# Patient Record
Sex: Female | Born: 1935 | Race: White | Hispanic: No | Marital: Single | Smoking: Former smoker
Health system: Southern US, Community
[De-identification: ages and names within clinical notes are randomized; demographics above are authoritative.]

## PROBLEM LIST (undated history)

## (undated) DIAGNOSIS — N289 Disorder of kidney and ureter, unspecified: Secondary | ICD-10-CM

## (undated) DIAGNOSIS — E7800 Pure hypercholesterolemia, unspecified: Secondary | ICD-10-CM

## (undated) DIAGNOSIS — R55 Syncope and collapse: Secondary | ICD-10-CM

## (undated) DIAGNOSIS — J449 Chronic obstructive pulmonary disease, unspecified: Secondary | ICD-10-CM

## (undated) DIAGNOSIS — F329 Major depressive disorder, single episode, unspecified: Secondary | ICD-10-CM

## (undated) DIAGNOSIS — R413 Other amnesia: Secondary | ICD-10-CM

## (undated) DIAGNOSIS — E119 Type 2 diabetes mellitus without complications: Secondary | ICD-10-CM

## (undated) DIAGNOSIS — G309 Alzheimer's disease, unspecified: Secondary | ICD-10-CM

## (undated) DIAGNOSIS — F32A Depression, unspecified: Secondary | ICD-10-CM

## (undated) DIAGNOSIS — M25512 Pain in left shoulder: Secondary | ICD-10-CM

## (undated) DIAGNOSIS — F0280 Dementia in other diseases classified elsewhere without behavioral disturbance: Secondary | ICD-10-CM

## (undated) DIAGNOSIS — G629 Polyneuropathy, unspecified: Secondary | ICD-10-CM

## (undated) DIAGNOSIS — Z950 Presence of cardiac pacemaker: Secondary | ICD-10-CM

## (undated) DIAGNOSIS — J40 Bronchitis, not specified as acute or chronic: Secondary | ICD-10-CM

## (undated) HISTORY — DX: Pure hypercholesterolemia, unspecified: E78.00

## (undated) HISTORY — DX: Presence of cardiac pacemaker: Z95.0

## (undated) HISTORY — PX: PACEMAKER PLACEMENT: SHX43

## (undated) HISTORY — PX: OTHER SURGICAL HISTORY: SHX169

## (undated) HISTORY — DX: Bronchitis, not specified as acute or chronic: J40

## (undated) HISTORY — DX: Syncope and collapse: R55

## (undated) HISTORY — DX: Type 2 diabetes mellitus without complications: E11.9

## (undated) HISTORY — DX: Chronic obstructive pulmonary disease, unspecified: J44.9

## (undated) HISTORY — DX: Depression, unspecified: F32.A

## (undated) HISTORY — DX: Alzheimer's disease, unspecified: G30.9

## (undated) HISTORY — DX: Pain in left shoulder: M25.512

## (undated) HISTORY — DX: Other amnesia: R41.3

## (undated) HISTORY — DX: Major depressive disorder, single episode, unspecified: F32.9

## (undated) HISTORY — DX: Polyneuropathy, unspecified: G62.9

## (undated) HISTORY — DX: Dementia in other diseases classified elsewhere, unspecified severity, without behavioral disturbance, psychotic disturbance, mood disturbance, and anxiety: F02.80

## (undated) HISTORY — DX: Disorder of kidney and ureter, unspecified: N28.9

---

## 2014-01-09 DIAGNOSIS — J45909 Unspecified asthma, uncomplicated: Secondary | ICD-10-CM | POA: Diagnosis not present

## 2014-01-09 DIAGNOSIS — E119 Type 2 diabetes mellitus without complications: Secondary | ICD-10-CM | POA: Diagnosis not present

## 2014-01-09 DIAGNOSIS — Z6827 Body mass index (BMI) 27.0-27.9, adult: Secondary | ICD-10-CM | POA: Diagnosis not present

## 2014-01-09 DIAGNOSIS — N390 Urinary tract infection, site not specified: Secondary | ICD-10-CM | POA: Diagnosis not present

## 2014-01-09 DIAGNOSIS — R350 Frequency of micturition: Secondary | ICD-10-CM | POA: Diagnosis not present

## 2014-01-09 DIAGNOSIS — J329 Chronic sinusitis, unspecified: Secondary | ICD-10-CM | POA: Diagnosis not present

## 2014-01-26 DIAGNOSIS — R300 Dysuria: Secondary | ICD-10-CM | POA: Diagnosis not present

## 2014-01-26 DIAGNOSIS — J309 Allergic rhinitis, unspecified: Secondary | ICD-10-CM | POA: Diagnosis not present

## 2014-01-26 DIAGNOSIS — E119 Type 2 diabetes mellitus without complications: Secondary | ICD-10-CM | POA: Diagnosis not present

## 2014-01-26 DIAGNOSIS — Z6827 Body mass index (BMI) 27.0-27.9, adult: Secondary | ICD-10-CM | POA: Diagnosis not present

## 2014-01-30 DIAGNOSIS — N390 Urinary tract infection, site not specified: Secondary | ICD-10-CM | POA: Diagnosis not present

## 2014-02-02 DIAGNOSIS — I10 Essential (primary) hypertension: Secondary | ICD-10-CM | POA: Diagnosis not present

## 2014-02-02 DIAGNOSIS — E119 Type 2 diabetes mellitus without complications: Secondary | ICD-10-CM | POA: Diagnosis not present

## 2014-02-02 DIAGNOSIS — E782 Mixed hyperlipidemia: Secondary | ICD-10-CM | POA: Diagnosis not present

## 2014-02-09 DIAGNOSIS — Z6828 Body mass index (BMI) 28.0-28.9, adult: Secondary | ICD-10-CM | POA: Diagnosis not present

## 2014-02-09 DIAGNOSIS — E119 Type 2 diabetes mellitus without complications: Secondary | ICD-10-CM | POA: Diagnosis not present

## 2014-02-09 DIAGNOSIS — J45909 Unspecified asthma, uncomplicated: Secondary | ICD-10-CM | POA: Diagnosis not present

## 2014-03-13 DIAGNOSIS — R062 Wheezing: Secondary | ICD-10-CM | POA: Diagnosis not present

## 2014-03-13 DIAGNOSIS — E119 Type 2 diabetes mellitus without complications: Secondary | ICD-10-CM | POA: Diagnosis not present

## 2014-03-13 DIAGNOSIS — J310 Chronic rhinitis: Secondary | ICD-10-CM | POA: Diagnosis not present

## 2014-03-13 DIAGNOSIS — Z6828 Body mass index (BMI) 28.0-28.9, adult: Secondary | ICD-10-CM | POA: Diagnosis not present

## 2014-03-13 DIAGNOSIS — J441 Chronic obstructive pulmonary disease with (acute) exacerbation: Secondary | ICD-10-CM | POA: Diagnosis not present

## 2014-03-14 DIAGNOSIS — E119 Type 2 diabetes mellitus without complications: Secondary | ICD-10-CM | POA: Diagnosis not present

## 2014-03-14 DIAGNOSIS — J3489 Other specified disorders of nose and nasal sinuses: Secondary | ICD-10-CM | POA: Diagnosis not present

## 2014-03-14 DIAGNOSIS — J310 Chronic rhinitis: Secondary | ICD-10-CM | POA: Diagnosis not present

## 2014-03-14 DIAGNOSIS — R197 Diarrhea, unspecified: Secondary | ICD-10-CM | POA: Diagnosis not present

## 2014-03-14 DIAGNOSIS — R0682 Tachypnea, not elsewhere classified: Secondary | ICD-10-CM | POA: Diagnosis not present

## 2014-03-14 DIAGNOSIS — R0789 Other chest pain: Secondary | ICD-10-CM | POA: Diagnosis not present

## 2014-03-14 DIAGNOSIS — R9431 Abnormal electrocardiogram [ECG] [EKG]: Secondary | ICD-10-CM | POA: Diagnosis not present

## 2014-03-14 DIAGNOSIS — J441 Chronic obstructive pulmonary disease with (acute) exacerbation: Secondary | ICD-10-CM | POA: Diagnosis not present

## 2014-03-14 DIAGNOSIS — Z792 Long term (current) use of antibiotics: Secondary | ICD-10-CM | POA: Diagnosis not present

## 2014-03-14 DIAGNOSIS — R0602 Shortness of breath: Secondary | ICD-10-CM | POA: Diagnosis not present

## 2014-03-14 DIAGNOSIS — R05 Cough: Secondary | ICD-10-CM | POA: Diagnosis not present

## 2014-03-14 DIAGNOSIS — Z6828 Body mass index (BMI) 28.0-28.9, adult: Secondary | ICD-10-CM | POA: Diagnosis not present

## 2014-03-14 DIAGNOSIS — J45909 Unspecified asthma, uncomplicated: Secondary | ICD-10-CM | POA: Diagnosis not present

## 2014-03-14 DIAGNOSIS — R059 Cough, unspecified: Secondary | ICD-10-CM | POA: Diagnosis not present

## 2014-03-14 DIAGNOSIS — R6883 Chills (without fever): Secondary | ICD-10-CM | POA: Diagnosis not present

## 2014-03-16 DIAGNOSIS — H25049 Posterior subcapsular polar age-related cataract, unspecified eye: Secondary | ICD-10-CM | POA: Diagnosis not present

## 2014-03-16 DIAGNOSIS — H2510 Age-related nuclear cataract, unspecified eye: Secondary | ICD-10-CM | POA: Diagnosis not present

## 2014-03-16 DIAGNOSIS — H25019 Cortical age-related cataract, unspecified eye: Secondary | ICD-10-CM | POA: Diagnosis not present

## 2014-03-16 DIAGNOSIS — H268 Other specified cataract: Secondary | ICD-10-CM | POA: Diagnosis not present

## 2014-03-20 DIAGNOSIS — R5383 Other fatigue: Secondary | ICD-10-CM | POA: Diagnosis not present

## 2014-03-20 DIAGNOSIS — J45902 Unspecified asthma with status asthmaticus: Secondary | ICD-10-CM | POA: Diagnosis not present

## 2014-03-20 DIAGNOSIS — R05 Cough: Secondary | ICD-10-CM | POA: Diagnosis not present

## 2014-03-20 DIAGNOSIS — R059 Cough, unspecified: Secondary | ICD-10-CM | POA: Diagnosis not present

## 2014-03-20 DIAGNOSIS — Z6827 Body mass index (BMI) 27.0-27.9, adult: Secondary | ICD-10-CM | POA: Diagnosis not present

## 2014-03-20 DIAGNOSIS — R5381 Other malaise: Secondary | ICD-10-CM | POA: Diagnosis not present

## 2014-03-20 DIAGNOSIS — J449 Chronic obstructive pulmonary disease, unspecified: Secondary | ICD-10-CM | POA: Diagnosis not present

## 2014-03-21 DIAGNOSIS — E871 Hypo-osmolality and hyponatremia: Secondary | ICD-10-CM | POA: Diagnosis not present

## 2014-03-21 DIAGNOSIS — R0602 Shortness of breath: Secondary | ICD-10-CM | POA: Diagnosis not present

## 2014-03-21 DIAGNOSIS — R059 Cough, unspecified: Secondary | ICD-10-CM | POA: Diagnosis not present

## 2014-03-21 DIAGNOSIS — R0682 Tachypnea, not elsewhere classified: Secondary | ICD-10-CM | POA: Diagnosis not present

## 2014-03-21 DIAGNOSIS — J45901 Unspecified asthma with (acute) exacerbation: Secondary | ICD-10-CM | POA: Diagnosis not present

## 2014-03-21 DIAGNOSIS — J45902 Unspecified asthma with status asthmaticus: Secondary | ICD-10-CM | POA: Diagnosis not present

## 2014-03-21 DIAGNOSIS — I10 Essential (primary) hypertension: Secondary | ICD-10-CM | POA: Diagnosis not present

## 2014-03-21 DIAGNOSIS — Z950 Presence of cardiac pacemaker: Secondary | ICD-10-CM | POA: Diagnosis not present

## 2014-03-21 DIAGNOSIS — E782 Mixed hyperlipidemia: Secondary | ICD-10-CM | POA: Diagnosis not present

## 2014-03-21 DIAGNOSIS — R0989 Other specified symptoms and signs involving the circulatory and respiratory systems: Secondary | ICD-10-CM | POA: Diagnosis not present

## 2014-03-21 DIAGNOSIS — J45909 Unspecified asthma, uncomplicated: Secondary | ICD-10-CM | POA: Diagnosis not present

## 2014-03-21 DIAGNOSIS — J40 Bronchitis, not specified as acute or chronic: Secondary | ICD-10-CM | POA: Diagnosis not present

## 2014-03-21 DIAGNOSIS — I498 Other specified cardiac arrhythmias: Secondary | ICD-10-CM | POA: Diagnosis present

## 2014-03-21 DIAGNOSIS — R0609 Other forms of dyspnea: Secondary | ICD-10-CM | POA: Diagnosis not present

## 2014-03-21 DIAGNOSIS — E785 Hyperlipidemia, unspecified: Secondary | ICD-10-CM | POA: Diagnosis not present

## 2014-03-27 DIAGNOSIS — J45901 Unspecified asthma with (acute) exacerbation: Secondary | ICD-10-CM | POA: Diagnosis not present

## 2014-03-27 DIAGNOSIS — E119 Type 2 diabetes mellitus without complications: Secondary | ICD-10-CM | POA: Diagnosis not present

## 2014-03-27 DIAGNOSIS — F411 Generalized anxiety disorder: Secondary | ICD-10-CM | POA: Diagnosis not present

## 2014-03-27 DIAGNOSIS — Z6826 Body mass index (BMI) 26.0-26.9, adult: Secondary | ICD-10-CM | POA: Diagnosis not present

## 2014-04-03 DIAGNOSIS — M2040 Other hammer toe(s) (acquired), unspecified foot: Secondary | ICD-10-CM | POA: Diagnosis not present

## 2014-04-03 DIAGNOSIS — E1149 Type 2 diabetes mellitus with other diabetic neurological complication: Secondary | ICD-10-CM | POA: Diagnosis not present

## 2014-04-03 DIAGNOSIS — J45909 Unspecified asthma, uncomplicated: Secondary | ICD-10-CM | POA: Diagnosis not present

## 2014-04-03 DIAGNOSIS — G589 Mononeuropathy, unspecified: Secondary | ICD-10-CM | POA: Diagnosis not present

## 2014-04-03 DIAGNOSIS — M2030 Hallux varus (acquired), unspecified foot: Secondary | ICD-10-CM | POA: Diagnosis not present

## 2014-04-03 DIAGNOSIS — E119 Type 2 diabetes mellitus without complications: Secondary | ICD-10-CM | POA: Diagnosis not present

## 2014-04-03 DIAGNOSIS — Z6826 Body mass index (BMI) 26.0-26.9, adult: Secondary | ICD-10-CM | POA: Diagnosis not present

## 2014-04-03 DIAGNOSIS — G909 Disorder of the autonomic nervous system, unspecified: Secondary | ICD-10-CM | POA: Diagnosis not present

## 2014-04-20 DIAGNOSIS — J449 Chronic obstructive pulmonary disease, unspecified: Secondary | ICD-10-CM | POA: Diagnosis not present

## 2014-04-20 DIAGNOSIS — E119 Type 2 diabetes mellitus without complications: Secondary | ICD-10-CM | POA: Diagnosis not present

## 2014-04-20 DIAGNOSIS — Z6827 Body mass index (BMI) 27.0-27.9, adult: Secondary | ICD-10-CM | POA: Diagnosis not present

## 2014-04-20 DIAGNOSIS — I779 Disorder of arteries and arterioles, unspecified: Secondary | ICD-10-CM | POA: Diagnosis not present

## 2014-04-26 DIAGNOSIS — M2040 Other hammer toe(s) (acquired), unspecified foot: Secondary | ICD-10-CM | POA: Diagnosis not present

## 2014-04-26 DIAGNOSIS — E1149 Type 2 diabetes mellitus with other diabetic neurological complication: Secondary | ICD-10-CM | POA: Diagnosis not present

## 2014-04-26 DIAGNOSIS — M216X9 Other acquired deformities of unspecified foot: Secondary | ICD-10-CM | POA: Diagnosis not present

## 2014-04-26 DIAGNOSIS — G909 Disorder of the autonomic nervous system, unspecified: Secondary | ICD-10-CM | POA: Diagnosis not present

## 2014-04-27 DIAGNOSIS — E119 Type 2 diabetes mellitus without complications: Secondary | ICD-10-CM | POA: Diagnosis not present

## 2014-04-27 DIAGNOSIS — J310 Chronic rhinitis: Secondary | ICD-10-CM | POA: Diagnosis not present

## 2014-04-27 DIAGNOSIS — J45909 Unspecified asthma, uncomplicated: Secondary | ICD-10-CM | POA: Diagnosis not present

## 2014-04-27 DIAGNOSIS — K140 Glossitis: Secondary | ICD-10-CM | POA: Diagnosis not present

## 2014-04-28 DIAGNOSIS — I209 Angina pectoris, unspecified: Secondary | ICD-10-CM | POA: Diagnosis not present

## 2014-04-28 DIAGNOSIS — E7800 Pure hypercholesterolemia, unspecified: Secondary | ICD-10-CM | POA: Diagnosis not present

## 2014-04-28 DIAGNOSIS — E119 Type 2 diabetes mellitus without complications: Secondary | ICD-10-CM | POA: Diagnosis not present

## 2014-04-28 DIAGNOSIS — I495 Sick sinus syndrome: Secondary | ICD-10-CM | POA: Diagnosis not present

## 2014-05-05 DIAGNOSIS — J309 Allergic rhinitis, unspecified: Secondary | ICD-10-CM | POA: Diagnosis not present

## 2014-05-05 DIAGNOSIS — J449 Chronic obstructive pulmonary disease, unspecified: Secondary | ICD-10-CM | POA: Diagnosis not present

## 2014-05-05 DIAGNOSIS — E119 Type 2 diabetes mellitus without complications: Secondary | ICD-10-CM | POA: Diagnosis not present

## 2014-05-05 DIAGNOSIS — Z6827 Body mass index (BMI) 27.0-27.9, adult: Secondary | ICD-10-CM | POA: Diagnosis not present

## 2014-05-11 DIAGNOSIS — I209 Angina pectoris, unspecified: Secondary | ICD-10-CM | POA: Diagnosis not present

## 2014-05-11 DIAGNOSIS — I495 Sick sinus syndrome: Secondary | ICD-10-CM | POA: Diagnosis not present

## 2014-05-11 DIAGNOSIS — I4949 Other premature depolarization: Secondary | ICD-10-CM | POA: Diagnosis not present

## 2014-05-11 DIAGNOSIS — R9431 Abnormal electrocardiogram [ECG] [EKG]: Secondary | ICD-10-CM | POA: Diagnosis not present

## 2014-05-11 DIAGNOSIS — I440 Atrioventricular block, first degree: Secondary | ICD-10-CM | POA: Diagnosis not present

## 2014-05-11 DIAGNOSIS — Z950 Presence of cardiac pacemaker: Secondary | ICD-10-CM | POA: Diagnosis not present

## 2014-05-11 DIAGNOSIS — E119 Type 2 diabetes mellitus without complications: Secondary | ICD-10-CM | POA: Diagnosis not present

## 2014-05-11 DIAGNOSIS — E7800 Pure hypercholesterolemia, unspecified: Secondary | ICD-10-CM | POA: Diagnosis not present

## 2014-05-18 DIAGNOSIS — J310 Chronic rhinitis: Secondary | ICD-10-CM | POA: Diagnosis not present

## 2014-05-30 DIAGNOSIS — Z1231 Encounter for screening mammogram for malignant neoplasm of breast: Secondary | ICD-10-CM | POA: Diagnosis not present

## 2014-06-08 DIAGNOSIS — J3089 Other allergic rhinitis: Secondary | ICD-10-CM | POA: Diagnosis not present

## 2014-06-08 DIAGNOSIS — J3081 Allergic rhinitis due to animal (cat) (dog) hair and dander: Secondary | ICD-10-CM | POA: Diagnosis not present

## 2014-06-09 DIAGNOSIS — Z6827 Body mass index (BMI) 27.0-27.9, adult: Secondary | ICD-10-CM | POA: Diagnosis not present

## 2014-06-09 DIAGNOSIS — J45909 Unspecified asthma, uncomplicated: Secondary | ICD-10-CM | POA: Diagnosis not present

## 2014-06-09 DIAGNOSIS — E119 Type 2 diabetes mellitus without complications: Secondary | ICD-10-CM | POA: Diagnosis not present

## 2014-06-09 DIAGNOSIS — J309 Allergic rhinitis, unspecified: Secondary | ICD-10-CM | POA: Diagnosis not present

## 2014-07-07 DIAGNOSIS — J309 Allergic rhinitis, unspecified: Secondary | ICD-10-CM | POA: Diagnosis not present

## 2014-07-07 DIAGNOSIS — J45909 Unspecified asthma, uncomplicated: Secondary | ICD-10-CM | POA: Diagnosis not present

## 2014-07-07 DIAGNOSIS — E119 Type 2 diabetes mellitus without complications: Secondary | ICD-10-CM | POA: Diagnosis not present

## 2014-07-21 DIAGNOSIS — J449 Chronic obstructive pulmonary disease, unspecified: Secondary | ICD-10-CM | POA: Diagnosis not present

## 2014-07-21 DIAGNOSIS — E119 Type 2 diabetes mellitus without complications: Secondary | ICD-10-CM | POA: Diagnosis not present

## 2014-07-21 DIAGNOSIS — E7800 Pure hypercholesterolemia, unspecified: Secondary | ICD-10-CM | POA: Diagnosis not present

## 2014-07-21 DIAGNOSIS — Z6827 Body mass index (BMI) 27.0-27.9, adult: Secondary | ICD-10-CM | POA: Diagnosis not present

## 2014-08-14 DIAGNOSIS — J309 Allergic rhinitis, unspecified: Secondary | ICD-10-CM | POA: Diagnosis not present

## 2014-08-14 DIAGNOSIS — Z6827 Body mass index (BMI) 27.0-27.9, adult: Secondary | ICD-10-CM | POA: Diagnosis not present

## 2014-08-14 DIAGNOSIS — E119 Type 2 diabetes mellitus without complications: Secondary | ICD-10-CM | POA: Diagnosis not present

## 2014-08-14 DIAGNOSIS — R413 Other amnesia: Secondary | ICD-10-CM | POA: Diagnosis not present

## 2014-08-16 DIAGNOSIS — L820 Inflamed seborrheic keratosis: Secondary | ICD-10-CM | POA: Diagnosis not present

## 2014-08-16 DIAGNOSIS — L905 Scar conditions and fibrosis of skin: Secondary | ICD-10-CM | POA: Diagnosis not present

## 2014-08-16 DIAGNOSIS — D692 Other nonthrombocytopenic purpura: Secondary | ICD-10-CM | POA: Diagnosis not present

## 2014-08-16 DIAGNOSIS — D485 Neoplasm of uncertain behavior of skin: Secondary | ICD-10-CM | POA: Diagnosis not present

## 2014-09-01 DIAGNOSIS — E559 Vitamin D deficiency, unspecified: Secondary | ICD-10-CM | POA: Diagnosis not present

## 2014-09-01 DIAGNOSIS — G609 Hereditary and idiopathic neuropathy, unspecified: Secondary | ICD-10-CM | POA: Diagnosis not present

## 2014-09-01 DIAGNOSIS — E119 Type 2 diabetes mellitus without complications: Secondary | ICD-10-CM | POA: Diagnosis not present

## 2014-09-01 DIAGNOSIS — E039 Hypothyroidism, unspecified: Secondary | ICD-10-CM | POA: Diagnosis not present

## 2014-09-01 DIAGNOSIS — M329 Systemic lupus erythematosus, unspecified: Secondary | ICD-10-CM | POA: Diagnosis not present

## 2014-09-01 DIAGNOSIS — R413 Other amnesia: Secondary | ICD-10-CM | POA: Diagnosis not present

## 2014-09-01 DIAGNOSIS — M069 Rheumatoid arthritis, unspecified: Secondary | ICD-10-CM | POA: Diagnosis not present

## 2014-09-01 DIAGNOSIS — E538 Deficiency of other specified B group vitamins: Secondary | ICD-10-CM | POA: Diagnosis not present

## 2014-09-01 DIAGNOSIS — M064 Inflammatory polyarthropathy: Secondary | ICD-10-CM | POA: Diagnosis not present

## 2014-09-02 DIAGNOSIS — S8010XA Contusion of unspecified lower leg, initial encounter: Secondary | ICD-10-CM | POA: Diagnosis not present

## 2014-09-02 DIAGNOSIS — E119 Type 2 diabetes mellitus without complications: Secondary | ICD-10-CM | POA: Diagnosis not present

## 2014-09-02 DIAGNOSIS — S81009A Unspecified open wound, unspecified knee, initial encounter: Secondary | ICD-10-CM | POA: Diagnosis not present

## 2014-09-06 DIAGNOSIS — E119 Type 2 diabetes mellitus without complications: Secondary | ICD-10-CM | POA: Diagnosis not present

## 2014-09-06 DIAGNOSIS — H268 Other specified cataract: Secondary | ICD-10-CM | POA: Diagnosis not present

## 2014-09-06 DIAGNOSIS — H25019 Cortical age-related cataract, unspecified eye: Secondary | ICD-10-CM | POA: Diagnosis not present

## 2014-09-06 DIAGNOSIS — H2510 Age-related nuclear cataract, unspecified eye: Secondary | ICD-10-CM | POA: Diagnosis not present

## 2014-09-07 DIAGNOSIS — J3089 Other allergic rhinitis: Secondary | ICD-10-CM | POA: Diagnosis not present

## 2014-09-07 DIAGNOSIS — J301 Allergic rhinitis due to pollen: Secondary | ICD-10-CM | POA: Diagnosis not present

## 2014-09-07 DIAGNOSIS — J45909 Unspecified asthma, uncomplicated: Secondary | ICD-10-CM | POA: Diagnosis not present

## 2014-09-07 DIAGNOSIS — J3081 Allergic rhinitis due to animal (cat) (dog) hair and dander: Secondary | ICD-10-CM | POA: Diagnosis not present

## 2014-09-11 DIAGNOSIS — G319 Degenerative disease of nervous system, unspecified: Secondary | ICD-10-CM | POA: Diagnosis not present

## 2014-09-11 DIAGNOSIS — R413 Other amnesia: Secondary | ICD-10-CM | POA: Diagnosis not present

## 2014-09-11 DIAGNOSIS — R51 Headache: Secondary | ICD-10-CM | POA: Diagnosis not present

## 2014-09-11 DIAGNOSIS — I999 Unspecified disorder of circulatory system: Secondary | ICD-10-CM | POA: Diagnosis not present

## 2014-09-18 DIAGNOSIS — J449 Chronic obstructive pulmonary disease, unspecified: Secondary | ICD-10-CM | POA: Diagnosis not present

## 2014-09-18 DIAGNOSIS — J309 Allergic rhinitis, unspecified: Secondary | ICD-10-CM | POA: Diagnosis not present

## 2014-09-18 DIAGNOSIS — E559 Vitamin D deficiency, unspecified: Secondary | ICD-10-CM | POA: Diagnosis not present

## 2014-09-18 DIAGNOSIS — M899 Disorder of bone, unspecified: Secondary | ICD-10-CM | POA: Diagnosis not present

## 2014-09-18 DIAGNOSIS — E119 Type 2 diabetes mellitus without complications: Secondary | ICD-10-CM | POA: Diagnosis not present

## 2014-09-18 DIAGNOSIS — M949 Disorder of cartilage, unspecified: Secondary | ICD-10-CM | POA: Diagnosis not present

## 2014-09-18 DIAGNOSIS — N951 Menopausal and female climacteric states: Secondary | ICD-10-CM | POA: Diagnosis not present

## 2014-09-22 DIAGNOSIS — E1149 Type 2 diabetes mellitus with other diabetic neurological complication: Secondary | ICD-10-CM | POA: Diagnosis not present

## 2014-09-22 DIAGNOSIS — G63 Polyneuropathy in diseases classified elsewhere: Secondary | ICD-10-CM | POA: Diagnosis not present

## 2014-09-22 DIAGNOSIS — R269 Unspecified abnormalities of gait and mobility: Secondary | ICD-10-CM | POA: Diagnosis not present

## 2014-09-22 DIAGNOSIS — R413 Other amnesia: Secondary | ICD-10-CM | POA: Diagnosis not present

## 2014-09-26 DIAGNOSIS — E1149 Type 2 diabetes mellitus with other diabetic neurological complication: Secondary | ICD-10-CM | POA: Diagnosis not present

## 2014-09-26 DIAGNOSIS — E1142 Type 2 diabetes mellitus with diabetic polyneuropathy: Secondary | ICD-10-CM | POA: Diagnosis not present

## 2014-09-26 DIAGNOSIS — G5790 Unspecified mononeuropathy of unspecified lower limb: Secondary | ICD-10-CM | POA: Diagnosis not present

## 2014-09-26 DIAGNOSIS — E785 Hyperlipidemia, unspecified: Secondary | ICD-10-CM | POA: Diagnosis not present

## 2014-09-26 DIAGNOSIS — Z794 Long term (current) use of insulin: Secondary | ICD-10-CM | POA: Diagnosis not present

## 2014-09-29 DIAGNOSIS — J309 Allergic rhinitis, unspecified: Secondary | ICD-10-CM | POA: Diagnosis not present

## 2014-09-29 DIAGNOSIS — Z6826 Body mass index (BMI) 26.0-26.9, adult: Secondary | ICD-10-CM | POA: Diagnosis not present

## 2014-09-29 DIAGNOSIS — E780 Pure hypercholesterolemia: Secondary | ICD-10-CM | POA: Diagnosis not present

## 2014-09-29 DIAGNOSIS — J45909 Unspecified asthma, uncomplicated: Secondary | ICD-10-CM | POA: Diagnosis not present

## 2014-10-06 DIAGNOSIS — H25813 Combined forms of age-related cataract, bilateral: Secondary | ICD-10-CM | POA: Diagnosis not present

## 2014-10-12 DIAGNOSIS — E119 Type 2 diabetes mellitus without complications: Secondary | ICD-10-CM | POA: Diagnosis not present

## 2014-10-12 DIAGNOSIS — R0989 Other specified symptoms and signs involving the circulatory and respiratory systems: Secondary | ICD-10-CM | POA: Diagnosis not present

## 2014-10-12 DIAGNOSIS — S51821A Laceration with foreign body of right forearm, initial encounter: Secondary | ICD-10-CM | POA: Diagnosis not present

## 2014-10-12 DIAGNOSIS — I2510 Atherosclerotic heart disease of native coronary artery without angina pectoris: Secondary | ICD-10-CM | POA: Diagnosis not present

## 2014-10-12 DIAGNOSIS — Z6827 Body mass index (BMI) 27.0-27.9, adult: Secondary | ICD-10-CM | POA: Diagnosis not present

## 2014-10-12 DIAGNOSIS — Z23 Encounter for immunization: Secondary | ICD-10-CM | POA: Diagnosis not present

## 2014-10-12 DIAGNOSIS — I6523 Occlusion and stenosis of bilateral carotid arteries: Secondary | ICD-10-CM | POA: Diagnosis not present

## 2014-10-12 DIAGNOSIS — R42 Dizziness and giddiness: Secondary | ICD-10-CM | POA: Diagnosis not present

## 2014-10-16 DIAGNOSIS — Z6827 Body mass index (BMI) 27.0-27.9, adult: Secondary | ICD-10-CM | POA: Diagnosis not present

## 2014-10-16 DIAGNOSIS — J45909 Unspecified asthma, uncomplicated: Secondary | ICD-10-CM | POA: Diagnosis not present

## 2014-10-16 DIAGNOSIS — E119 Type 2 diabetes mellitus without complications: Secondary | ICD-10-CM | POA: Diagnosis not present

## 2014-10-16 DIAGNOSIS — I803 Phlebitis and thrombophlebitis of lower extremities, unspecified: Secondary | ICD-10-CM | POA: Diagnosis not present

## 2014-11-03 DIAGNOSIS — E780 Pure hypercholesterolemia: Secondary | ICD-10-CM | POA: Diagnosis not present

## 2014-11-03 DIAGNOSIS — R9431 Abnormal electrocardiogram [ECG] [EKG]: Secondary | ICD-10-CM | POA: Diagnosis not present

## 2014-11-03 DIAGNOSIS — I442 Atrioventricular block, complete: Secondary | ICD-10-CM | POA: Diagnosis not present

## 2014-11-03 DIAGNOSIS — Z45018 Encounter for adjustment and management of other part of cardiac pacemaker: Secondary | ICD-10-CM | POA: Diagnosis not present

## 2014-11-09 DIAGNOSIS — R58 Hemorrhage, not elsewhere classified: Secondary | ICD-10-CM | POA: Diagnosis not present

## 2014-11-09 DIAGNOSIS — J449 Chronic obstructive pulmonary disease, unspecified: Secondary | ICD-10-CM | POA: Diagnosis not present

## 2014-11-09 DIAGNOSIS — Z6827 Body mass index (BMI) 27.0-27.9, adult: Secondary | ICD-10-CM | POA: Diagnosis not present

## 2014-11-09 DIAGNOSIS — S51839A Puncture wound without foreign body of unspecified forearm, initial encounter: Secondary | ICD-10-CM | POA: Diagnosis not present

## 2014-11-16 DIAGNOSIS — J449 Chronic obstructive pulmonary disease, unspecified: Secondary | ICD-10-CM | POA: Diagnosis not present

## 2014-11-16 DIAGNOSIS — Z6827 Body mass index (BMI) 27.0-27.9, adult: Secondary | ICD-10-CM | POA: Diagnosis not present

## 2014-11-16 DIAGNOSIS — J309 Allergic rhinitis, unspecified: Secondary | ICD-10-CM | POA: Diagnosis not present

## 2014-11-16 DIAGNOSIS — G4700 Insomnia, unspecified: Secondary | ICD-10-CM | POA: Diagnosis not present

## 2014-12-04 DIAGNOSIS — E119 Type 2 diabetes mellitus without complications: Secondary | ICD-10-CM | POA: Diagnosis not present

## 2014-12-07 DIAGNOSIS — J4530 Mild persistent asthma, uncomplicated: Secondary | ICD-10-CM | POA: Diagnosis not present

## 2014-12-07 DIAGNOSIS — J209 Acute bronchitis, unspecified: Secondary | ICD-10-CM | POA: Diagnosis not present

## 2015-01-15 DIAGNOSIS — J309 Allergic rhinitis, unspecified: Secondary | ICD-10-CM | POA: Diagnosis not present

## 2015-01-15 DIAGNOSIS — R42 Dizziness and giddiness: Secondary | ICD-10-CM | POA: Diagnosis not present

## 2015-01-15 DIAGNOSIS — Z6826 Body mass index (BMI) 26.0-26.9, adult: Secondary | ICD-10-CM | POA: Diagnosis not present

## 2015-01-15 DIAGNOSIS — E119 Type 2 diabetes mellitus without complications: Secondary | ICD-10-CM | POA: Diagnosis not present

## 2015-01-17 DIAGNOSIS — G63 Polyneuropathy in diseases classified elsewhere: Secondary | ICD-10-CM | POA: Diagnosis not present

## 2015-01-17 DIAGNOSIS — R260 Ataxic gait: Secondary | ICD-10-CM | POA: Diagnosis not present

## 2015-01-17 DIAGNOSIS — R412 Retrograde amnesia: Secondary | ICD-10-CM | POA: Diagnosis not present

## 2015-01-17 DIAGNOSIS — E1140 Type 2 diabetes mellitus with diabetic neuropathy, unspecified: Secondary | ICD-10-CM | POA: Diagnosis not present

## 2015-01-18 DIAGNOSIS — F09 Unspecified mental disorder due to known physiological condition: Secondary | ICD-10-CM | POA: Diagnosis not present

## 2015-01-18 DIAGNOSIS — J449 Chronic obstructive pulmonary disease, unspecified: Secondary | ICD-10-CM | POA: Diagnosis not present

## 2015-01-18 DIAGNOSIS — J309 Allergic rhinitis, unspecified: Secondary | ICD-10-CM | POA: Diagnosis not present

## 2015-01-18 DIAGNOSIS — E118 Type 2 diabetes mellitus with unspecified complications: Secondary | ICD-10-CM | POA: Diagnosis not present

## 2015-01-18 DIAGNOSIS — E1140 Type 2 diabetes mellitus with diabetic neuropathy, unspecified: Secondary | ICD-10-CM | POA: Diagnosis not present

## 2015-01-18 DIAGNOSIS — E785 Hyperlipidemia, unspecified: Secondary | ICD-10-CM | POA: Diagnosis not present

## 2015-01-18 DIAGNOSIS — F321 Major depressive disorder, single episode, moderate: Secondary | ICD-10-CM | POA: Diagnosis not present

## 2015-01-23 DIAGNOSIS — M2022 Hallux rigidus, left foot: Secondary | ICD-10-CM | POA: Diagnosis not present

## 2015-01-23 DIAGNOSIS — M2042 Other hammer toe(s) (acquired), left foot: Secondary | ICD-10-CM | POA: Diagnosis not present

## 2015-01-23 DIAGNOSIS — M79672 Pain in left foot: Secondary | ICD-10-CM | POA: Diagnosis not present

## 2015-01-23 DIAGNOSIS — G608 Other hereditary and idiopathic neuropathies: Secondary | ICD-10-CM | POA: Diagnosis not present

## 2015-01-23 DIAGNOSIS — M25572 Pain in left ankle and joints of left foot: Secondary | ICD-10-CM | POA: Diagnosis not present

## 2015-02-02 DIAGNOSIS — E118 Type 2 diabetes mellitus with unspecified complications: Secondary | ICD-10-CM | POA: Diagnosis not present

## 2015-02-02 DIAGNOSIS — F321 Major depressive disorder, single episode, moderate: Secondary | ICD-10-CM | POA: Diagnosis not present

## 2015-02-02 DIAGNOSIS — J449 Chronic obstructive pulmonary disease, unspecified: Secondary | ICD-10-CM | POA: Diagnosis not present

## 2015-02-02 DIAGNOSIS — E1140 Type 2 diabetes mellitus with diabetic neuropathy, unspecified: Secondary | ICD-10-CM | POA: Diagnosis not present

## 2015-02-02 DIAGNOSIS — J309 Allergic rhinitis, unspecified: Secondary | ICD-10-CM | POA: Diagnosis not present

## 2015-02-02 DIAGNOSIS — E785 Hyperlipidemia, unspecified: Secondary | ICD-10-CM | POA: Diagnosis not present

## 2015-02-02 DIAGNOSIS — F09 Unspecified mental disorder due to known physiological condition: Secondary | ICD-10-CM | POA: Diagnosis not present

## 2015-02-14 DIAGNOSIS — M2041 Other hammer toe(s) (acquired), right foot: Secondary | ICD-10-CM | POA: Diagnosis not present

## 2015-02-14 DIAGNOSIS — E1143 Type 2 diabetes mellitus with diabetic autonomic (poly)neuropathy: Secondary | ICD-10-CM | POA: Diagnosis not present

## 2015-02-14 DIAGNOSIS — M25572 Pain in left ankle and joints of left foot: Secondary | ICD-10-CM | POA: Diagnosis not present

## 2015-02-14 DIAGNOSIS — M2042 Other hammer toe(s) (acquired), left foot: Secondary | ICD-10-CM | POA: Diagnosis not present

## 2015-02-19 DIAGNOSIS — E1140 Type 2 diabetes mellitus with diabetic neuropathy, unspecified: Secondary | ICD-10-CM | POA: Diagnosis not present

## 2015-02-19 DIAGNOSIS — J309 Allergic rhinitis, unspecified: Secondary | ICD-10-CM | POA: Diagnosis not present

## 2015-02-19 DIAGNOSIS — F321 Major depressive disorder, single episode, moderate: Secondary | ICD-10-CM | POA: Diagnosis not present

## 2015-02-19 DIAGNOSIS — F419 Anxiety disorder, unspecified: Secondary | ICD-10-CM | POA: Diagnosis not present

## 2015-02-27 DIAGNOSIS — G609 Hereditary and idiopathic neuropathy, unspecified: Secondary | ICD-10-CM | POA: Diagnosis not present

## 2015-03-02 DIAGNOSIS — G609 Hereditary and idiopathic neuropathy, unspecified: Secondary | ICD-10-CM | POA: Diagnosis not present

## 2015-03-06 DIAGNOSIS — G609 Hereditary and idiopathic neuropathy, unspecified: Secondary | ICD-10-CM | POA: Diagnosis not present

## 2015-03-07 DIAGNOSIS — F321 Major depressive disorder, single episode, moderate: Secondary | ICD-10-CM | POA: Diagnosis not present

## 2015-03-07 DIAGNOSIS — F419 Anxiety disorder, unspecified: Secondary | ICD-10-CM | POA: Diagnosis not present

## 2015-03-07 DIAGNOSIS — J309 Allergic rhinitis, unspecified: Secondary | ICD-10-CM | POA: Diagnosis not present

## 2015-03-07 DIAGNOSIS — E1140 Type 2 diabetes mellitus with diabetic neuropathy, unspecified: Secondary | ICD-10-CM | POA: Diagnosis not present

## 2015-03-09 DIAGNOSIS — G609 Hereditary and idiopathic neuropathy, unspecified: Secondary | ICD-10-CM | POA: Diagnosis not present

## 2015-03-13 DIAGNOSIS — G609 Hereditary and idiopathic neuropathy, unspecified: Secondary | ICD-10-CM | POA: Diagnosis not present

## 2015-03-15 DIAGNOSIS — E785 Hyperlipidemia, unspecified: Secondary | ICD-10-CM | POA: Diagnosis not present

## 2015-03-15 DIAGNOSIS — J449 Chronic obstructive pulmonary disease, unspecified: Secondary | ICD-10-CM | POA: Diagnosis not present

## 2015-03-15 DIAGNOSIS — F321 Major depressive disorder, single episode, moderate: Secondary | ICD-10-CM | POA: Diagnosis not present

## 2015-03-15 DIAGNOSIS — E1140 Type 2 diabetes mellitus with diabetic neuropathy, unspecified: Secondary | ICD-10-CM | POA: Diagnosis not present

## 2015-03-15 DIAGNOSIS — Z950 Presence of cardiac pacemaker: Secondary | ICD-10-CM | POA: Diagnosis not present

## 2015-03-16 DIAGNOSIS — G609 Hereditary and idiopathic neuropathy, unspecified: Secondary | ICD-10-CM | POA: Diagnosis not present

## 2015-03-19 DIAGNOSIS — E785 Hyperlipidemia, unspecified: Secondary | ICD-10-CM | POA: Diagnosis not present

## 2015-03-19 DIAGNOSIS — F321 Major depressive disorder, single episode, moderate: Secondary | ICD-10-CM | POA: Diagnosis not present

## 2015-03-19 DIAGNOSIS — E1140 Type 2 diabetes mellitus with diabetic neuropathy, unspecified: Secondary | ICD-10-CM | POA: Diagnosis not present

## 2015-03-21 DIAGNOSIS — E1142 Type 2 diabetes mellitus with diabetic polyneuropathy: Secondary | ICD-10-CM | POA: Diagnosis not present

## 2015-03-21 DIAGNOSIS — M79672 Pain in left foot: Secondary | ICD-10-CM | POA: Diagnosis not present

## 2015-03-21 DIAGNOSIS — S93612A Sprain of tarsal ligament of left foot, initial encounter: Secondary | ICD-10-CM | POA: Diagnosis not present

## 2015-03-28 DIAGNOSIS — R05 Cough: Secondary | ICD-10-CM | POA: Diagnosis not present

## 2015-03-28 DIAGNOSIS — Z950 Presence of cardiac pacemaker: Secondary | ICD-10-CM | POA: Diagnosis not present

## 2015-03-28 DIAGNOSIS — E1140 Type 2 diabetes mellitus with diabetic neuropathy, unspecified: Secondary | ICD-10-CM | POA: Diagnosis not present

## 2015-03-28 DIAGNOSIS — E785 Hyperlipidemia, unspecified: Secondary | ICD-10-CM | POA: Diagnosis not present

## 2015-03-28 DIAGNOSIS — S02402S Zygomatic fracture, unspecified, sequela: Secondary | ICD-10-CM | POA: Diagnosis not present

## 2015-03-28 DIAGNOSIS — J449 Chronic obstructive pulmonary disease, unspecified: Secondary | ICD-10-CM | POA: Diagnosis not present

## 2015-04-01 DIAGNOSIS — S2231XA Fracture of one rib, right side, initial encounter for closed fracture: Secondary | ICD-10-CM | POA: Diagnosis not present

## 2015-04-01 DIAGNOSIS — R55 Syncope and collapse: Secondary | ICD-10-CM | POA: Diagnosis not present

## 2015-04-01 DIAGNOSIS — S02609A Fracture of mandible, unspecified, initial encounter for closed fracture: Secondary | ICD-10-CM | POA: Diagnosis not present

## 2015-04-01 DIAGNOSIS — I951 Orthostatic hypotension: Secondary | ICD-10-CM | POA: Diagnosis present

## 2015-04-01 DIAGNOSIS — R4182 Altered mental status, unspecified: Secondary | ICD-10-CM | POA: Diagnosis not present

## 2015-04-01 DIAGNOSIS — J45909 Unspecified asthma, uncomplicated: Secondary | ICD-10-CM | POA: Diagnosis present

## 2015-04-01 DIAGNOSIS — W010XXA Fall on same level from slipping, tripping and stumbling without subsequent striking against object, initial encounter: Secondary | ICD-10-CM | POA: Diagnosis present

## 2015-04-01 DIAGNOSIS — R0789 Other chest pain: Secondary | ICD-10-CM | POA: Diagnosis not present

## 2015-04-01 DIAGNOSIS — G629 Polyneuropathy, unspecified: Secondary | ICD-10-CM | POA: Diagnosis not present

## 2015-04-01 DIAGNOSIS — E785 Hyperlipidemia, unspecified: Secondary | ICD-10-CM | POA: Diagnosis present

## 2015-04-01 DIAGNOSIS — M25511 Pain in right shoulder: Secondary | ICD-10-CM | POA: Diagnosis not present

## 2015-04-01 DIAGNOSIS — E119 Type 2 diabetes mellitus without complications: Secondary | ICD-10-CM | POA: Diagnosis present

## 2015-04-01 DIAGNOSIS — S02402A Zygomatic fracture, unspecified, initial encounter for closed fracture: Secondary | ICD-10-CM | POA: Diagnosis not present

## 2015-04-01 DIAGNOSIS — Z950 Presence of cardiac pacemaker: Secondary | ICD-10-CM | POA: Diagnosis not present

## 2015-04-01 DIAGNOSIS — S4991XA Unspecified injury of right shoulder and upper arm, initial encounter: Secondary | ICD-10-CM | POA: Diagnosis not present

## 2015-04-01 DIAGNOSIS — S40011A Contusion of right shoulder, initial encounter: Secondary | ICD-10-CM | POA: Diagnosis not present

## 2015-04-01 DIAGNOSIS — S060X9A Concussion with loss of consciousness of unspecified duration, initial encounter: Secondary | ICD-10-CM | POA: Diagnosis not present

## 2015-04-01 DIAGNOSIS — S02401A Maxillary fracture, unspecified, initial encounter for closed fracture: Secondary | ICD-10-CM | POA: Diagnosis not present

## 2015-04-01 DIAGNOSIS — E782 Mixed hyperlipidemia: Secondary | ICD-10-CM | POA: Diagnosis not present

## 2015-04-01 DIAGNOSIS — I2510 Atherosclerotic heart disease of native coronary artery without angina pectoris: Secondary | ICD-10-CM | POA: Diagnosis not present

## 2015-04-01 DIAGNOSIS — I10 Essential (primary) hypertension: Secondary | ICD-10-CM | POA: Diagnosis not present

## 2015-04-01 DIAGNOSIS — J449 Chronic obstructive pulmonary disease, unspecified: Secondary | ICD-10-CM | POA: Diagnosis not present

## 2015-04-01 DIAGNOSIS — S0990XA Unspecified injury of head, initial encounter: Secondary | ICD-10-CM | POA: Diagnosis not present

## 2015-04-04 DIAGNOSIS — S51812D Laceration without foreign body of left forearm, subsequent encounter: Secondary | ICD-10-CM | POA: Diagnosis not present

## 2015-04-04 DIAGNOSIS — R2689 Other abnormalities of gait and mobility: Secondary | ICD-10-CM | POA: Diagnosis not present

## 2015-04-04 DIAGNOSIS — M6281 Muscle weakness (generalized): Secondary | ICD-10-CM | POA: Diagnosis not present

## 2015-04-04 DIAGNOSIS — J45909 Unspecified asthma, uncomplicated: Secondary | ICD-10-CM | POA: Diagnosis not present

## 2015-04-04 DIAGNOSIS — I10 Essential (primary) hypertension: Secondary | ICD-10-CM | POA: Diagnosis not present

## 2015-04-04 DIAGNOSIS — Z950 Presence of cardiac pacemaker: Secondary | ICD-10-CM | POA: Diagnosis not present

## 2015-04-04 DIAGNOSIS — S22008D Other fracture of unspecified thoracic vertebra, subsequent encounter for fracture with routine healing: Secondary | ICD-10-CM | POA: Diagnosis not present

## 2015-04-04 DIAGNOSIS — Z9181 History of falling: Secondary | ICD-10-CM | POA: Diagnosis not present

## 2015-04-04 DIAGNOSIS — E119 Type 2 diabetes mellitus without complications: Secondary | ICD-10-CM | POA: Diagnosis not present

## 2015-04-04 DIAGNOSIS — G629 Polyneuropathy, unspecified: Secondary | ICD-10-CM | POA: Diagnosis not present

## 2015-04-04 DIAGNOSIS — I4430 Unspecified atrioventricular block: Secondary | ICD-10-CM | POA: Diagnosis not present

## 2015-04-04 DIAGNOSIS — S02401D Maxillary fracture, unspecified, subsequent encounter for fracture with routine healing: Secondary | ICD-10-CM | POA: Diagnosis not present

## 2015-04-06 DIAGNOSIS — R05 Cough: Secondary | ICD-10-CM | POA: Diagnosis not present

## 2015-04-06 DIAGNOSIS — S02402A Zygomatic fracture, unspecified, initial encounter for closed fracture: Secondary | ICD-10-CM | POA: Diagnosis not present

## 2015-04-06 DIAGNOSIS — E1140 Type 2 diabetes mellitus with diabetic neuropathy, unspecified: Secondary | ICD-10-CM | POA: Diagnosis not present

## 2015-04-06 DIAGNOSIS — E785 Hyperlipidemia, unspecified: Secondary | ICD-10-CM | POA: Diagnosis not present

## 2015-04-06 DIAGNOSIS — M25811 Other specified joint disorders, right shoulder: Secondary | ICD-10-CM | POA: Diagnosis not present

## 2015-04-06 DIAGNOSIS — R410 Disorientation, unspecified: Secondary | ICD-10-CM | POA: Diagnosis not present

## 2015-04-06 DIAGNOSIS — F321 Major depressive disorder, single episode, moderate: Secondary | ICD-10-CM | POA: Diagnosis not present

## 2015-04-07 DIAGNOSIS — M6281 Muscle weakness (generalized): Secondary | ICD-10-CM | POA: Diagnosis not present

## 2015-04-07 DIAGNOSIS — S02401D Maxillary fracture, unspecified, subsequent encounter for fracture with routine healing: Secondary | ICD-10-CM | POA: Diagnosis not present

## 2015-04-07 DIAGNOSIS — R2689 Other abnormalities of gait and mobility: Secondary | ICD-10-CM | POA: Diagnosis not present

## 2015-04-07 DIAGNOSIS — S51812D Laceration without foreign body of left forearm, subsequent encounter: Secondary | ICD-10-CM | POA: Diagnosis not present

## 2015-04-07 DIAGNOSIS — S22008D Other fracture of unspecified thoracic vertebra, subsequent encounter for fracture with routine healing: Secondary | ICD-10-CM | POA: Diagnosis not present

## 2015-04-07 DIAGNOSIS — G629 Polyneuropathy, unspecified: Secondary | ICD-10-CM | POA: Diagnosis not present

## 2015-04-09 DIAGNOSIS — S22008D Other fracture of unspecified thoracic vertebra, subsequent encounter for fracture with routine healing: Secondary | ICD-10-CM | POA: Diagnosis not present

## 2015-04-09 DIAGNOSIS — G629 Polyneuropathy, unspecified: Secondary | ICD-10-CM | POA: Diagnosis not present

## 2015-04-09 DIAGNOSIS — M6281 Muscle weakness (generalized): Secondary | ICD-10-CM | POA: Diagnosis not present

## 2015-04-09 DIAGNOSIS — R2689 Other abnormalities of gait and mobility: Secondary | ICD-10-CM | POA: Diagnosis not present

## 2015-04-09 DIAGNOSIS — S51812D Laceration without foreign body of left forearm, subsequent encounter: Secondary | ICD-10-CM | POA: Diagnosis not present

## 2015-04-09 DIAGNOSIS — S02401D Maxillary fracture, unspecified, subsequent encounter for fracture with routine healing: Secondary | ICD-10-CM | POA: Diagnosis not present

## 2015-04-10 DIAGNOSIS — S02401D Maxillary fracture, unspecified, subsequent encounter for fracture with routine healing: Secondary | ICD-10-CM | POA: Diagnosis not present

## 2015-04-10 DIAGNOSIS — S51812D Laceration without foreign body of left forearm, subsequent encounter: Secondary | ICD-10-CM | POA: Diagnosis not present

## 2015-04-10 DIAGNOSIS — G629 Polyneuropathy, unspecified: Secondary | ICD-10-CM | POA: Diagnosis not present

## 2015-04-10 DIAGNOSIS — R2689 Other abnormalities of gait and mobility: Secondary | ICD-10-CM | POA: Diagnosis not present

## 2015-04-10 DIAGNOSIS — S22008D Other fracture of unspecified thoracic vertebra, subsequent encounter for fracture with routine healing: Secondary | ICD-10-CM | POA: Diagnosis not present

## 2015-04-10 DIAGNOSIS — M6281 Muscle weakness (generalized): Secondary | ICD-10-CM | POA: Diagnosis not present

## 2015-04-12 DIAGNOSIS — G629 Polyneuropathy, unspecified: Secondary | ICD-10-CM | POA: Diagnosis not present

## 2015-04-12 DIAGNOSIS — R631 Polydipsia: Secondary | ICD-10-CM | POA: Diagnosis not present

## 2015-04-12 DIAGNOSIS — E300 Delayed puberty: Secondary | ICD-10-CM | POA: Diagnosis not present

## 2015-04-12 DIAGNOSIS — F321 Major depressive disorder, single episode, moderate: Secondary | ICD-10-CM | POA: Diagnosis not present

## 2015-04-12 DIAGNOSIS — S02401D Maxillary fracture, unspecified, subsequent encounter for fracture with routine healing: Secondary | ICD-10-CM | POA: Diagnosis not present

## 2015-04-12 DIAGNOSIS — S2231XA Fracture of one rib, right side, initial encounter for closed fracture: Secondary | ICD-10-CM | POA: Diagnosis not present

## 2015-04-12 DIAGNOSIS — R4182 Altered mental status, unspecified: Secondary | ICD-10-CM | POA: Diagnosis not present

## 2015-04-12 DIAGNOSIS — M6281 Muscle weakness (generalized): Secondary | ICD-10-CM | POA: Diagnosis not present

## 2015-04-12 DIAGNOSIS — S51812D Laceration without foreign body of left forearm, subsequent encounter: Secondary | ICD-10-CM | POA: Diagnosis not present

## 2015-04-12 DIAGNOSIS — E1140 Type 2 diabetes mellitus with diabetic neuropathy, unspecified: Secondary | ICD-10-CM | POA: Diagnosis not present

## 2015-04-12 DIAGNOSIS — N390 Urinary tract infection, site not specified: Secondary | ICD-10-CM | POA: Diagnosis not present

## 2015-04-12 DIAGNOSIS — R34 Anuria and oliguria: Secondary | ICD-10-CM | POA: Diagnosis not present

## 2015-04-12 DIAGNOSIS — S22008D Other fracture of unspecified thoracic vertebra, subsequent encounter for fracture with routine healing: Secondary | ICD-10-CM | POA: Diagnosis not present

## 2015-04-12 DIAGNOSIS — R300 Dysuria: Secondary | ICD-10-CM | POA: Diagnosis not present

## 2015-04-12 DIAGNOSIS — M25811 Other specified joint disorders, right shoulder: Secondary | ICD-10-CM | POA: Diagnosis not present

## 2015-04-12 DIAGNOSIS — R2689 Other abnormalities of gait and mobility: Secondary | ICD-10-CM | POA: Diagnosis not present

## 2015-04-12 DIAGNOSIS — R410 Disorientation, unspecified: Secondary | ICD-10-CM | POA: Diagnosis not present

## 2015-04-13 DIAGNOSIS — E300 Delayed puberty: Secondary | ICD-10-CM | POA: Diagnosis not present

## 2015-04-13 DIAGNOSIS — E1140 Type 2 diabetes mellitus with diabetic neuropathy, unspecified: Secondary | ICD-10-CM | POA: Diagnosis not present

## 2015-04-16 DIAGNOSIS — R2689 Other abnormalities of gait and mobility: Secondary | ICD-10-CM | POA: Diagnosis not present

## 2015-04-16 DIAGNOSIS — S22008D Other fracture of unspecified thoracic vertebra, subsequent encounter for fracture with routine healing: Secondary | ICD-10-CM | POA: Diagnosis not present

## 2015-04-16 DIAGNOSIS — M6281 Muscle weakness (generalized): Secondary | ICD-10-CM | POA: Diagnosis not present

## 2015-04-16 DIAGNOSIS — S02401D Maxillary fracture, unspecified, subsequent encounter for fracture with routine healing: Secondary | ICD-10-CM | POA: Diagnosis not present

## 2015-04-16 DIAGNOSIS — G629 Polyneuropathy, unspecified: Secondary | ICD-10-CM | POA: Diagnosis not present

## 2015-04-16 DIAGNOSIS — S51812D Laceration without foreign body of left forearm, subsequent encounter: Secondary | ICD-10-CM | POA: Diagnosis not present

## 2015-04-18 DIAGNOSIS — S02401D Maxillary fracture, unspecified, subsequent encounter for fracture with routine healing: Secondary | ICD-10-CM | POA: Diagnosis not present

## 2015-04-18 DIAGNOSIS — G629 Polyneuropathy, unspecified: Secondary | ICD-10-CM | POA: Diagnosis not present

## 2015-04-18 DIAGNOSIS — S51812D Laceration without foreign body of left forearm, subsequent encounter: Secondary | ICD-10-CM | POA: Diagnosis not present

## 2015-04-18 DIAGNOSIS — S22008D Other fracture of unspecified thoracic vertebra, subsequent encounter for fracture with routine healing: Secondary | ICD-10-CM | POA: Diagnosis not present

## 2015-04-18 DIAGNOSIS — M6281 Muscle weakness (generalized): Secondary | ICD-10-CM | POA: Diagnosis not present

## 2015-04-18 DIAGNOSIS — R2689 Other abnormalities of gait and mobility: Secondary | ICD-10-CM | POA: Diagnosis not present

## 2015-04-19 DIAGNOSIS — S02401D Maxillary fracture, unspecified, subsequent encounter for fracture with routine healing: Secondary | ICD-10-CM | POA: Diagnosis not present

## 2015-04-19 DIAGNOSIS — M6281 Muscle weakness (generalized): Secondary | ICD-10-CM | POA: Diagnosis not present

## 2015-04-19 DIAGNOSIS — G629 Polyneuropathy, unspecified: Secondary | ICD-10-CM | POA: Diagnosis not present

## 2015-04-19 DIAGNOSIS — S51812D Laceration without foreign body of left forearm, subsequent encounter: Secondary | ICD-10-CM | POA: Diagnosis not present

## 2015-04-19 DIAGNOSIS — R2689 Other abnormalities of gait and mobility: Secondary | ICD-10-CM | POA: Diagnosis not present

## 2015-04-19 DIAGNOSIS — S22008D Other fracture of unspecified thoracic vertebra, subsequent encounter for fracture with routine healing: Secondary | ICD-10-CM | POA: Diagnosis not present

## 2015-04-20 DIAGNOSIS — M6281 Muscle weakness (generalized): Secondary | ICD-10-CM | POA: Diagnosis not present

## 2015-04-20 DIAGNOSIS — S22008D Other fracture of unspecified thoracic vertebra, subsequent encounter for fracture with routine healing: Secondary | ICD-10-CM | POA: Diagnosis not present

## 2015-04-20 DIAGNOSIS — G629 Polyneuropathy, unspecified: Secondary | ICD-10-CM | POA: Diagnosis not present

## 2015-04-20 DIAGNOSIS — R2689 Other abnormalities of gait and mobility: Secondary | ICD-10-CM | POA: Diagnosis not present

## 2015-04-20 DIAGNOSIS — S51812D Laceration without foreign body of left forearm, subsequent encounter: Secondary | ICD-10-CM | POA: Diagnosis not present

## 2015-04-20 DIAGNOSIS — S02401D Maxillary fracture, unspecified, subsequent encounter for fracture with routine healing: Secondary | ICD-10-CM | POA: Diagnosis not present

## 2015-04-23 DIAGNOSIS — M6281 Muscle weakness (generalized): Secondary | ICD-10-CM | POA: Diagnosis not present

## 2015-04-23 DIAGNOSIS — R2689 Other abnormalities of gait and mobility: Secondary | ICD-10-CM | POA: Diagnosis not present

## 2015-04-23 DIAGNOSIS — M25811 Other specified joint disorders, right shoulder: Secondary | ICD-10-CM | POA: Diagnosis not present

## 2015-04-23 DIAGNOSIS — S22008D Other fracture of unspecified thoracic vertebra, subsequent encounter for fracture with routine healing: Secondary | ICD-10-CM | POA: Diagnosis not present

## 2015-04-23 DIAGNOSIS — S51812D Laceration without foreign body of left forearm, subsequent encounter: Secondary | ICD-10-CM | POA: Diagnosis not present

## 2015-04-23 DIAGNOSIS — J342 Deviated nasal septum: Secondary | ICD-10-CM | POA: Diagnosis not present

## 2015-04-23 DIAGNOSIS — S2231XA Fracture of one rib, right side, initial encounter for closed fracture: Secondary | ICD-10-CM | POA: Diagnosis not present

## 2015-04-23 DIAGNOSIS — R34 Anuria and oliguria: Secondary | ICD-10-CM | POA: Diagnosis not present

## 2015-04-23 DIAGNOSIS — G629 Polyneuropathy, unspecified: Secondary | ICD-10-CM | POA: Diagnosis not present

## 2015-04-23 DIAGNOSIS — S022XXD Fracture of nasal bones, subsequent encounter for fracture with routine healing: Secondary | ICD-10-CM | POA: Diagnosis not present

## 2015-04-23 DIAGNOSIS — R4182 Altered mental status, unspecified: Secondary | ICD-10-CM | POA: Diagnosis not present

## 2015-04-23 DIAGNOSIS — R631 Polydipsia: Secondary | ICD-10-CM | POA: Diagnosis not present

## 2015-04-23 DIAGNOSIS — F321 Major depressive disorder, single episode, moderate: Secondary | ICD-10-CM | POA: Diagnosis not present

## 2015-04-23 DIAGNOSIS — J324 Chronic pansinusitis: Secondary | ICD-10-CM | POA: Diagnosis not present

## 2015-04-23 DIAGNOSIS — R410 Disorientation, unspecified: Secondary | ICD-10-CM | POA: Diagnosis not present

## 2015-04-23 DIAGNOSIS — N390 Urinary tract infection, site not specified: Secondary | ICD-10-CM | POA: Diagnosis not present

## 2015-04-23 DIAGNOSIS — S02401D Maxillary fracture, unspecified, subsequent encounter for fracture with routine healing: Secondary | ICD-10-CM | POA: Diagnosis not present

## 2015-04-23 DIAGNOSIS — Z87891 Personal history of nicotine dependence: Secondary | ICD-10-CM | POA: Diagnosis not present

## 2015-04-23 DIAGNOSIS — R300 Dysuria: Secondary | ICD-10-CM | POA: Diagnosis not present

## 2015-04-24 DIAGNOSIS — S2231XA Fracture of one rib, right side, initial encounter for closed fracture: Secondary | ICD-10-CM | POA: Diagnosis not present

## 2015-04-24 DIAGNOSIS — R410 Disorientation, unspecified: Secondary | ICD-10-CM | POA: Diagnosis not present

## 2015-04-24 DIAGNOSIS — R34 Anuria and oliguria: Secondary | ICD-10-CM | POA: Diagnosis not present

## 2015-04-24 DIAGNOSIS — G629 Polyneuropathy, unspecified: Secondary | ICD-10-CM | POA: Diagnosis not present

## 2015-04-24 DIAGNOSIS — N390 Urinary tract infection, site not specified: Secondary | ICD-10-CM | POA: Diagnosis not present

## 2015-04-24 DIAGNOSIS — M6281 Muscle weakness (generalized): Secondary | ICD-10-CM | POA: Diagnosis not present

## 2015-04-24 DIAGNOSIS — E1140 Type 2 diabetes mellitus with diabetic neuropathy, unspecified: Secondary | ICD-10-CM | POA: Diagnosis not present

## 2015-04-24 DIAGNOSIS — S02401D Maxillary fracture, unspecified, subsequent encounter for fracture with routine healing: Secondary | ICD-10-CM | POA: Diagnosis not present

## 2015-04-24 DIAGNOSIS — R2689 Other abnormalities of gait and mobility: Secondary | ICD-10-CM | POA: Diagnosis not present

## 2015-04-24 DIAGNOSIS — R631 Polydipsia: Secondary | ICD-10-CM | POA: Diagnosis not present

## 2015-04-24 DIAGNOSIS — M25811 Other specified joint disorders, right shoulder: Secondary | ICD-10-CM | POA: Diagnosis not present

## 2015-04-24 DIAGNOSIS — R4182 Altered mental status, unspecified: Secondary | ICD-10-CM | POA: Diagnosis not present

## 2015-04-24 DIAGNOSIS — R300 Dysuria: Secondary | ICD-10-CM | POA: Diagnosis not present

## 2015-04-24 DIAGNOSIS — F321 Major depressive disorder, single episode, moderate: Secondary | ICD-10-CM | POA: Diagnosis not present

## 2015-04-24 DIAGNOSIS — S51812D Laceration without foreign body of left forearm, subsequent encounter: Secondary | ICD-10-CM | POA: Diagnosis not present

## 2015-04-24 DIAGNOSIS — S22008D Other fracture of unspecified thoracic vertebra, subsequent encounter for fracture with routine healing: Secondary | ICD-10-CM | POA: Diagnosis not present

## 2015-04-25 DIAGNOSIS — Z950 Presence of cardiac pacemaker: Secondary | ICD-10-CM | POA: Diagnosis not present

## 2015-04-25 DIAGNOSIS — R42 Dizziness and giddiness: Secondary | ICD-10-CM | POA: Diagnosis not present

## 2015-04-25 DIAGNOSIS — R4182 Altered mental status, unspecified: Secondary | ICD-10-CM | POA: Diagnosis not present

## 2015-04-25 DIAGNOSIS — S0990XA Unspecified injury of head, initial encounter: Secondary | ICD-10-CM | POA: Diagnosis not present

## 2015-04-25 DIAGNOSIS — S299XXA Unspecified injury of thorax, initial encounter: Secondary | ICD-10-CM | POA: Diagnosis not present

## 2015-04-25 DIAGNOSIS — I6782 Cerebral ischemia: Secondary | ICD-10-CM | POA: Diagnosis not present

## 2015-04-26 DIAGNOSIS — S22008D Other fracture of unspecified thoracic vertebra, subsequent encounter for fracture with routine healing: Secondary | ICD-10-CM | POA: Diagnosis not present

## 2015-04-26 DIAGNOSIS — S02401D Maxillary fracture, unspecified, subsequent encounter for fracture with routine healing: Secondary | ICD-10-CM | POA: Diagnosis not present

## 2015-04-26 DIAGNOSIS — R2689 Other abnormalities of gait and mobility: Secondary | ICD-10-CM | POA: Diagnosis not present

## 2015-04-26 DIAGNOSIS — M6281 Muscle weakness (generalized): Secondary | ICD-10-CM | POA: Diagnosis not present

## 2015-04-26 DIAGNOSIS — G629 Polyneuropathy, unspecified: Secondary | ICD-10-CM | POA: Diagnosis not present

## 2015-04-26 DIAGNOSIS — S51812D Laceration without foreign body of left forearm, subsequent encounter: Secondary | ICD-10-CM | POA: Diagnosis not present

## 2015-04-27 DIAGNOSIS — M6281 Muscle weakness (generalized): Secondary | ICD-10-CM | POA: Diagnosis not present

## 2015-04-27 DIAGNOSIS — S51812D Laceration without foreign body of left forearm, subsequent encounter: Secondary | ICD-10-CM | POA: Diagnosis not present

## 2015-04-27 DIAGNOSIS — S22008D Other fracture of unspecified thoracic vertebra, subsequent encounter for fracture with routine healing: Secondary | ICD-10-CM | POA: Diagnosis not present

## 2015-04-27 DIAGNOSIS — R2689 Other abnormalities of gait and mobility: Secondary | ICD-10-CM | POA: Diagnosis not present

## 2015-04-27 DIAGNOSIS — S02401D Maxillary fracture, unspecified, subsequent encounter for fracture with routine healing: Secondary | ICD-10-CM | POA: Diagnosis not present

## 2015-04-27 DIAGNOSIS — G629 Polyneuropathy, unspecified: Secondary | ICD-10-CM | POA: Diagnosis not present

## 2015-04-30 DIAGNOSIS — R9431 Abnormal electrocardiogram [ECG] [EKG]: Secondary | ICD-10-CM | POA: Diagnosis not present

## 2015-04-30 DIAGNOSIS — E780 Pure hypercholesterolemia: Secondary | ICD-10-CM | POA: Diagnosis not present

## 2015-04-30 DIAGNOSIS — M7541 Impingement syndrome of right shoulder: Secondary | ICD-10-CM | POA: Diagnosis not present

## 2015-04-30 DIAGNOSIS — I495 Sick sinus syndrome: Secondary | ICD-10-CM | POA: Diagnosis not present

## 2015-05-01 DIAGNOSIS — R2689 Other abnormalities of gait and mobility: Secondary | ICD-10-CM | POA: Diagnosis not present

## 2015-05-01 DIAGNOSIS — G629 Polyneuropathy, unspecified: Secondary | ICD-10-CM | POA: Diagnosis not present

## 2015-05-01 DIAGNOSIS — M6281 Muscle weakness (generalized): Secondary | ICD-10-CM | POA: Diagnosis not present

## 2015-05-01 DIAGNOSIS — S22008D Other fracture of unspecified thoracic vertebra, subsequent encounter for fracture with routine healing: Secondary | ICD-10-CM | POA: Diagnosis not present

## 2015-05-01 DIAGNOSIS — S51812D Laceration without foreign body of left forearm, subsequent encounter: Secondary | ICD-10-CM | POA: Diagnosis not present

## 2015-05-01 DIAGNOSIS — S02401D Maxillary fracture, unspecified, subsequent encounter for fracture with routine healing: Secondary | ICD-10-CM | POA: Diagnosis not present

## 2015-05-02 DIAGNOSIS — M6281 Muscle weakness (generalized): Secondary | ICD-10-CM | POA: Diagnosis not present

## 2015-05-02 DIAGNOSIS — S51812D Laceration without foreign body of left forearm, subsequent encounter: Secondary | ICD-10-CM | POA: Diagnosis not present

## 2015-05-02 DIAGNOSIS — R2689 Other abnormalities of gait and mobility: Secondary | ICD-10-CM | POA: Diagnosis not present

## 2015-05-02 DIAGNOSIS — G629 Polyneuropathy, unspecified: Secondary | ICD-10-CM | POA: Diagnosis not present

## 2015-05-02 DIAGNOSIS — S22008D Other fracture of unspecified thoracic vertebra, subsequent encounter for fracture with routine healing: Secondary | ICD-10-CM | POA: Diagnosis not present

## 2015-05-02 DIAGNOSIS — S02401D Maxillary fracture, unspecified, subsequent encounter for fracture with routine healing: Secondary | ICD-10-CM | POA: Diagnosis not present

## 2015-05-09 DIAGNOSIS — S02401D Maxillary fracture, unspecified, subsequent encounter for fracture with routine healing: Secondary | ICD-10-CM | POA: Diagnosis not present

## 2015-05-09 DIAGNOSIS — R4182 Altered mental status, unspecified: Secondary | ICD-10-CM | POA: Diagnosis not present

## 2015-05-09 DIAGNOSIS — N390 Urinary tract infection, site not specified: Secondary | ICD-10-CM | POA: Diagnosis not present

## 2015-05-09 DIAGNOSIS — E1140 Type 2 diabetes mellitus with diabetic neuropathy, unspecified: Secondary | ICD-10-CM | POA: Diagnosis not present

## 2015-05-09 DIAGNOSIS — R05 Cough: Secondary | ICD-10-CM | POA: Diagnosis not present

## 2015-05-09 DIAGNOSIS — R410 Disorientation, unspecified: Secondary | ICD-10-CM | POA: Diagnosis not present

## 2015-05-09 DIAGNOSIS — F321 Major depressive disorder, single episode, moderate: Secondary | ICD-10-CM | POA: Diagnosis not present

## 2015-05-09 DIAGNOSIS — R2689 Other abnormalities of gait and mobility: Secondary | ICD-10-CM | POA: Diagnosis not present

## 2015-05-09 DIAGNOSIS — S51812D Laceration without foreign body of left forearm, subsequent encounter: Secondary | ICD-10-CM | POA: Diagnosis not present

## 2015-05-09 DIAGNOSIS — M6281 Muscle weakness (generalized): Secondary | ICD-10-CM | POA: Diagnosis not present

## 2015-05-09 DIAGNOSIS — G629 Polyneuropathy, unspecified: Secondary | ICD-10-CM | POA: Diagnosis not present

## 2015-05-09 DIAGNOSIS — S22008D Other fracture of unspecified thoracic vertebra, subsequent encounter for fracture with routine healing: Secondary | ICD-10-CM | POA: Diagnosis not present

## 2015-05-09 DIAGNOSIS — M25811 Other specified joint disorders, right shoulder: Secondary | ICD-10-CM | POA: Diagnosis not present

## 2015-05-09 DIAGNOSIS — E785 Hyperlipidemia, unspecified: Secondary | ICD-10-CM | POA: Diagnosis not present

## 2015-05-10 DIAGNOSIS — G63 Polyneuropathy in diseases classified elsewhere: Secondary | ICD-10-CM | POA: Diagnosis not present

## 2015-05-10 DIAGNOSIS — S060X0A Concussion without loss of consciousness, initial encounter: Secondary | ICD-10-CM | POA: Diagnosis not present

## 2015-05-10 DIAGNOSIS — E1140 Type 2 diabetes mellitus with diabetic neuropathy, unspecified: Secondary | ICD-10-CM | POA: Diagnosis not present

## 2015-05-10 DIAGNOSIS — R412 Retrograde amnesia: Secondary | ICD-10-CM | POA: Diagnosis not present

## 2015-05-11 DIAGNOSIS — S02401D Maxillary fracture, unspecified, subsequent encounter for fracture with routine healing: Secondary | ICD-10-CM | POA: Diagnosis not present

## 2015-05-11 DIAGNOSIS — M6281 Muscle weakness (generalized): Secondary | ICD-10-CM | POA: Diagnosis not present

## 2015-05-11 DIAGNOSIS — S22008D Other fracture of unspecified thoracic vertebra, subsequent encounter for fracture with routine healing: Secondary | ICD-10-CM | POA: Diagnosis not present

## 2015-05-11 DIAGNOSIS — G629 Polyneuropathy, unspecified: Secondary | ICD-10-CM | POA: Diagnosis not present

## 2015-05-11 DIAGNOSIS — R2689 Other abnormalities of gait and mobility: Secondary | ICD-10-CM | POA: Diagnosis not present

## 2015-05-11 DIAGNOSIS — S51812D Laceration without foreign body of left forearm, subsequent encounter: Secondary | ICD-10-CM | POA: Diagnosis not present

## 2015-05-14 DIAGNOSIS — Z794 Long term (current) use of insulin: Secondary | ICD-10-CM | POA: Diagnosis not present

## 2015-05-14 DIAGNOSIS — E119 Type 2 diabetes mellitus without complications: Secondary | ICD-10-CM | POA: Diagnosis not present

## 2015-05-14 DIAGNOSIS — R064 Hyperventilation: Secondary | ICD-10-CM | POA: Diagnosis not present

## 2015-05-14 DIAGNOSIS — E1140 Type 2 diabetes mellitus with diabetic neuropathy, unspecified: Secondary | ICD-10-CM | POA: Diagnosis not present

## 2015-05-14 DIAGNOSIS — E871 Hypo-osmolality and hyponatremia: Secondary | ICD-10-CM | POA: Diagnosis not present

## 2015-05-14 DIAGNOSIS — R4182 Altered mental status, unspecified: Secondary | ICD-10-CM | POA: Diagnosis not present

## 2015-05-14 DIAGNOSIS — N390 Urinary tract infection, site not specified: Secondary | ICD-10-CM | POA: Diagnosis not present

## 2015-05-14 DIAGNOSIS — F419 Anxiety disorder, unspecified: Secondary | ICD-10-CM | POA: Diagnosis not present

## 2015-05-14 DIAGNOSIS — R0602 Shortness of breath: Secondary | ICD-10-CM | POA: Diagnosis not present

## 2015-05-14 DIAGNOSIS — R410 Disorientation, unspecified: Secondary | ICD-10-CM | POA: Diagnosis not present

## 2015-05-15 DIAGNOSIS — R2689 Other abnormalities of gait and mobility: Secondary | ICD-10-CM | POA: Diagnosis not present

## 2015-05-15 DIAGNOSIS — S51812D Laceration without foreign body of left forearm, subsequent encounter: Secondary | ICD-10-CM | POA: Diagnosis not present

## 2015-05-15 DIAGNOSIS — G629 Polyneuropathy, unspecified: Secondary | ICD-10-CM | POA: Diagnosis not present

## 2015-05-15 DIAGNOSIS — S02401D Maxillary fracture, unspecified, subsequent encounter for fracture with routine healing: Secondary | ICD-10-CM | POA: Diagnosis not present

## 2015-05-15 DIAGNOSIS — S22008D Other fracture of unspecified thoracic vertebra, subsequent encounter for fracture with routine healing: Secondary | ICD-10-CM | POA: Diagnosis not present

## 2015-05-15 DIAGNOSIS — M6281 Muscle weakness (generalized): Secondary | ICD-10-CM | POA: Diagnosis not present

## 2015-05-17 DIAGNOSIS — G629 Polyneuropathy, unspecified: Secondary | ICD-10-CM | POA: Diagnosis not present

## 2015-05-17 DIAGNOSIS — S51812D Laceration without foreign body of left forearm, subsequent encounter: Secondary | ICD-10-CM | POA: Diagnosis not present

## 2015-05-17 DIAGNOSIS — R2689 Other abnormalities of gait and mobility: Secondary | ICD-10-CM | POA: Diagnosis not present

## 2015-05-17 DIAGNOSIS — M6281 Muscle weakness (generalized): Secondary | ICD-10-CM | POA: Diagnosis not present

## 2015-05-17 DIAGNOSIS — S02401D Maxillary fracture, unspecified, subsequent encounter for fracture with routine healing: Secondary | ICD-10-CM | POA: Diagnosis not present

## 2015-05-17 DIAGNOSIS — S22008D Other fracture of unspecified thoracic vertebra, subsequent encounter for fracture with routine healing: Secondary | ICD-10-CM | POA: Diagnosis not present

## 2015-05-18 DIAGNOSIS — S22008D Other fracture of unspecified thoracic vertebra, subsequent encounter for fracture with routine healing: Secondary | ICD-10-CM | POA: Diagnosis not present

## 2015-05-18 DIAGNOSIS — M6281 Muscle weakness (generalized): Secondary | ICD-10-CM | POA: Diagnosis not present

## 2015-05-18 DIAGNOSIS — R2689 Other abnormalities of gait and mobility: Secondary | ICD-10-CM | POA: Diagnosis not present

## 2015-05-18 DIAGNOSIS — S02401D Maxillary fracture, unspecified, subsequent encounter for fracture with routine healing: Secondary | ICD-10-CM | POA: Diagnosis not present

## 2015-05-18 DIAGNOSIS — G629 Polyneuropathy, unspecified: Secondary | ICD-10-CM | POA: Diagnosis not present

## 2015-05-18 DIAGNOSIS — S51812D Laceration without foreign body of left forearm, subsequent encounter: Secondary | ICD-10-CM | POA: Diagnosis not present

## 2015-05-21 DIAGNOSIS — G629 Polyneuropathy, unspecified: Secondary | ICD-10-CM | POA: Diagnosis not present

## 2015-05-21 DIAGNOSIS — M6281 Muscle weakness (generalized): Secondary | ICD-10-CM | POA: Diagnosis not present

## 2015-05-21 DIAGNOSIS — R2689 Other abnormalities of gait and mobility: Secondary | ICD-10-CM | POA: Diagnosis not present

## 2015-05-21 DIAGNOSIS — S22008D Other fracture of unspecified thoracic vertebra, subsequent encounter for fracture with routine healing: Secondary | ICD-10-CM | POA: Diagnosis not present

## 2015-05-21 DIAGNOSIS — S02401D Maxillary fracture, unspecified, subsequent encounter for fracture with routine healing: Secondary | ICD-10-CM | POA: Diagnosis not present

## 2015-05-21 DIAGNOSIS — S51812D Laceration without foreign body of left forearm, subsequent encounter: Secondary | ICD-10-CM | POA: Diagnosis not present

## 2015-05-23 DIAGNOSIS — G629 Polyneuropathy, unspecified: Secondary | ICD-10-CM | POA: Diagnosis not present

## 2015-05-23 DIAGNOSIS — S51812D Laceration without foreign body of left forearm, subsequent encounter: Secondary | ICD-10-CM | POA: Diagnosis not present

## 2015-05-23 DIAGNOSIS — R2689 Other abnormalities of gait and mobility: Secondary | ICD-10-CM | POA: Diagnosis not present

## 2015-05-23 DIAGNOSIS — S02401D Maxillary fracture, unspecified, subsequent encounter for fracture with routine healing: Secondary | ICD-10-CM | POA: Diagnosis not present

## 2015-05-23 DIAGNOSIS — M6281 Muscle weakness (generalized): Secondary | ICD-10-CM | POA: Diagnosis not present

## 2015-05-23 DIAGNOSIS — S22008D Other fracture of unspecified thoracic vertebra, subsequent encounter for fracture with routine healing: Secondary | ICD-10-CM | POA: Diagnosis not present

## 2015-05-25 DIAGNOSIS — S22008D Other fracture of unspecified thoracic vertebra, subsequent encounter for fracture with routine healing: Secondary | ICD-10-CM | POA: Diagnosis not present

## 2015-05-25 DIAGNOSIS — G629 Polyneuropathy, unspecified: Secondary | ICD-10-CM | POA: Diagnosis not present

## 2015-05-25 DIAGNOSIS — S02401D Maxillary fracture, unspecified, subsequent encounter for fracture with routine healing: Secondary | ICD-10-CM | POA: Diagnosis not present

## 2015-05-25 DIAGNOSIS — R2689 Other abnormalities of gait and mobility: Secondary | ICD-10-CM | POA: Diagnosis not present

## 2015-05-25 DIAGNOSIS — S51812D Laceration without foreign body of left forearm, subsequent encounter: Secondary | ICD-10-CM | POA: Diagnosis not present

## 2015-05-25 DIAGNOSIS — M6281 Muscle weakness (generalized): Secondary | ICD-10-CM | POA: Diagnosis not present

## 2015-05-29 DIAGNOSIS — R2689 Other abnormalities of gait and mobility: Secondary | ICD-10-CM | POA: Diagnosis not present

## 2015-05-29 DIAGNOSIS — S51812D Laceration without foreign body of left forearm, subsequent encounter: Secondary | ICD-10-CM | POA: Diagnosis not present

## 2015-05-29 DIAGNOSIS — S22008D Other fracture of unspecified thoracic vertebra, subsequent encounter for fracture with routine healing: Secondary | ICD-10-CM | POA: Diagnosis not present

## 2015-05-29 DIAGNOSIS — M6281 Muscle weakness (generalized): Secondary | ICD-10-CM | POA: Diagnosis not present

## 2015-05-29 DIAGNOSIS — S02401D Maxillary fracture, unspecified, subsequent encounter for fracture with routine healing: Secondary | ICD-10-CM | POA: Diagnosis not present

## 2015-05-29 DIAGNOSIS — G629 Polyneuropathy, unspecified: Secondary | ICD-10-CM | POA: Diagnosis not present

## 2015-05-31 DIAGNOSIS — R2689 Other abnormalities of gait and mobility: Secondary | ICD-10-CM | POA: Diagnosis not present

## 2015-05-31 DIAGNOSIS — S22008D Other fracture of unspecified thoracic vertebra, subsequent encounter for fracture with routine healing: Secondary | ICD-10-CM | POA: Diagnosis not present

## 2015-05-31 DIAGNOSIS — G629 Polyneuropathy, unspecified: Secondary | ICD-10-CM | POA: Diagnosis not present

## 2015-05-31 DIAGNOSIS — S02401D Maxillary fracture, unspecified, subsequent encounter for fracture with routine healing: Secondary | ICD-10-CM | POA: Diagnosis not present

## 2015-05-31 DIAGNOSIS — S51812D Laceration without foreign body of left forearm, subsequent encounter: Secondary | ICD-10-CM | POA: Diagnosis not present

## 2015-05-31 DIAGNOSIS — M6281 Muscle weakness (generalized): Secondary | ICD-10-CM | POA: Diagnosis not present

## 2015-06-01 DIAGNOSIS — M25511 Pain in right shoulder: Secondary | ICD-10-CM | POA: Diagnosis not present

## 2015-06-01 DIAGNOSIS — S51812D Laceration without foreign body of left forearm, subsequent encounter: Secondary | ICD-10-CM | POA: Diagnosis not present

## 2015-06-01 DIAGNOSIS — M7541 Impingement syndrome of right shoulder: Secondary | ICD-10-CM | POA: Diagnosis not present

## 2015-06-01 DIAGNOSIS — M75101 Unspecified rotator cuff tear or rupture of right shoulder, not specified as traumatic: Secondary | ICD-10-CM | POA: Diagnosis not present

## 2015-06-01 DIAGNOSIS — M6281 Muscle weakness (generalized): Secondary | ICD-10-CM | POA: Diagnosis not present

## 2015-06-01 DIAGNOSIS — G629 Polyneuropathy, unspecified: Secondary | ICD-10-CM | POA: Diagnosis not present

## 2015-06-01 DIAGNOSIS — X58XXXA Exposure to other specified factors, initial encounter: Secondary | ICD-10-CM | POA: Diagnosis not present

## 2015-06-01 DIAGNOSIS — R2689 Other abnormalities of gait and mobility: Secondary | ICD-10-CM | POA: Diagnosis not present

## 2015-06-01 DIAGNOSIS — S02401D Maxillary fracture, unspecified, subsequent encounter for fracture with routine healing: Secondary | ICD-10-CM | POA: Diagnosis not present

## 2015-06-01 DIAGNOSIS — S22008D Other fracture of unspecified thoracic vertebra, subsequent encounter for fracture with routine healing: Secondary | ICD-10-CM | POA: Diagnosis not present

## 2015-06-03 DIAGNOSIS — I10 Essential (primary) hypertension: Secondary | ICD-10-CM | POA: Diagnosis not present

## 2015-06-03 DIAGNOSIS — E873 Alkalosis: Secondary | ICD-10-CM | POA: Diagnosis present

## 2015-06-03 DIAGNOSIS — J9600 Acute respiratory failure, unspecified whether with hypoxia or hypercapnia: Secondary | ICD-10-CM | POA: Diagnosis not present

## 2015-06-03 DIAGNOSIS — J449 Chronic obstructive pulmonary disease, unspecified: Secondary | ICD-10-CM | POA: Diagnosis present

## 2015-06-03 DIAGNOSIS — E1142 Type 2 diabetes mellitus with diabetic polyneuropathy: Secondary | ICD-10-CM | POA: Diagnosis present

## 2015-06-03 DIAGNOSIS — F419 Anxiety disorder, unspecified: Secondary | ICD-10-CM | POA: Diagnosis not present

## 2015-06-03 DIAGNOSIS — E785 Hyperlipidemia, unspecified: Secondary | ICD-10-CM | POA: Diagnosis present

## 2015-06-03 DIAGNOSIS — J45909 Unspecified asthma, uncomplicated: Secondary | ICD-10-CM | POA: Diagnosis not present

## 2015-06-03 DIAGNOSIS — N390 Urinary tract infection, site not specified: Secondary | ICD-10-CM | POA: Diagnosis not present

## 2015-06-03 DIAGNOSIS — E118 Type 2 diabetes mellitus with unspecified complications: Secondary | ICD-10-CM | POA: Diagnosis not present

## 2015-06-03 DIAGNOSIS — R4182 Altered mental status, unspecified: Secondary | ICD-10-CM | POA: Diagnosis not present

## 2015-06-03 DIAGNOSIS — F0281 Dementia in other diseases classified elsewhere with behavioral disturbance: Secondary | ICD-10-CM | POA: Diagnosis not present

## 2015-06-03 DIAGNOSIS — R0602 Shortness of breath: Secondary | ICD-10-CM | POA: Diagnosis not present

## 2015-06-03 DIAGNOSIS — Z87891 Personal history of nicotine dependence: Secondary | ICD-10-CM | POA: Diagnosis not present

## 2015-06-03 DIAGNOSIS — M1990 Unspecified osteoarthritis, unspecified site: Secondary | ICD-10-CM | POA: Diagnosis present

## 2015-06-03 DIAGNOSIS — Z882 Allergy status to sulfonamides status: Secondary | ICD-10-CM | POA: Diagnosis not present

## 2015-06-03 DIAGNOSIS — I70219 Atherosclerosis of native arteries of extremities with intermittent claudication, unspecified extremity: Secondary | ICD-10-CM | POA: Diagnosis not present

## 2015-06-03 DIAGNOSIS — Z950 Presence of cardiac pacemaker: Secondary | ICD-10-CM | POA: Diagnosis not present

## 2015-06-03 DIAGNOSIS — Z880 Allergy status to penicillin: Secondary | ICD-10-CM | POA: Diagnosis not present

## 2015-06-03 DIAGNOSIS — E782 Mixed hyperlipidemia: Secondary | ICD-10-CM | POA: Diagnosis not present

## 2015-06-03 DIAGNOSIS — I951 Orthostatic hypotension: Secondary | ICD-10-CM | POA: Diagnosis not present

## 2015-06-03 DIAGNOSIS — E871 Hypo-osmolality and hyponatremia: Secondary | ICD-10-CM | POA: Diagnosis not present

## 2015-06-04 DIAGNOSIS — E782 Mixed hyperlipidemia: Secondary | ICD-10-CM | POA: Diagnosis not present

## 2015-06-04 DIAGNOSIS — E873 Alkalosis: Secondary | ICD-10-CM | POA: Diagnosis not present

## 2015-06-04 DIAGNOSIS — N390 Urinary tract infection, site not specified: Secondary | ICD-10-CM | POA: Diagnosis not present

## 2015-06-04 DIAGNOSIS — E871 Hypo-osmolality and hyponatremia: Secondary | ICD-10-CM | POA: Diagnosis not present

## 2015-06-04 DIAGNOSIS — R4182 Altered mental status, unspecified: Secondary | ICD-10-CM | POA: Diagnosis not present

## 2015-06-04 DIAGNOSIS — E118 Type 2 diabetes mellitus with unspecified complications: Secondary | ICD-10-CM | POA: Diagnosis not present

## 2015-06-04 DIAGNOSIS — F0281 Dementia in other diseases classified elsewhere with behavioral disturbance: Secondary | ICD-10-CM | POA: Diagnosis not present

## 2015-06-04 DIAGNOSIS — E1142 Type 2 diabetes mellitus with diabetic polyneuropathy: Secondary | ICD-10-CM | POA: Diagnosis not present

## 2015-06-04 DIAGNOSIS — J449 Chronic obstructive pulmonary disease, unspecified: Secondary | ICD-10-CM | POA: Diagnosis not present

## 2015-06-04 DIAGNOSIS — E785 Hyperlipidemia, unspecified: Secondary | ICD-10-CM | POA: Diagnosis not present

## 2015-06-05 DIAGNOSIS — E871 Hypo-osmolality and hyponatremia: Secondary | ICD-10-CM | POA: Diagnosis not present

## 2015-06-05 DIAGNOSIS — F0281 Dementia in other diseases classified elsewhere with behavioral disturbance: Secondary | ICD-10-CM | POA: Diagnosis not present

## 2015-06-05 DIAGNOSIS — J449 Chronic obstructive pulmonary disease, unspecified: Secondary | ICD-10-CM | POA: Diagnosis not present

## 2015-06-05 DIAGNOSIS — E118 Type 2 diabetes mellitus with unspecified complications: Secondary | ICD-10-CM | POA: Diagnosis not present

## 2015-06-05 DIAGNOSIS — E785 Hyperlipidemia, unspecified: Secondary | ICD-10-CM | POA: Diagnosis not present

## 2015-06-05 DIAGNOSIS — R4182 Altered mental status, unspecified: Secondary | ICD-10-CM | POA: Diagnosis not present

## 2015-06-05 DIAGNOSIS — N390 Urinary tract infection, site not specified: Secondary | ICD-10-CM | POA: Diagnosis not present

## 2015-06-05 DIAGNOSIS — E873 Alkalosis: Secondary | ICD-10-CM | POA: Diagnosis not present

## 2015-06-05 DIAGNOSIS — E1142 Type 2 diabetes mellitus with diabetic polyneuropathy: Secondary | ICD-10-CM | POA: Diagnosis not present

## 2015-06-05 DIAGNOSIS — E782 Mixed hyperlipidemia: Secondary | ICD-10-CM | POA: Diagnosis not present

## 2015-06-06 DIAGNOSIS — E1140 Type 2 diabetes mellitus with diabetic neuropathy, unspecified: Secondary | ICD-10-CM | POA: Diagnosis not present

## 2015-06-06 DIAGNOSIS — E871 Hypo-osmolality and hyponatremia: Secondary | ICD-10-CM | POA: Diagnosis not present

## 2015-06-06 DIAGNOSIS — R4182 Altered mental status, unspecified: Secondary | ICD-10-CM | POA: Diagnosis not present

## 2015-06-06 DIAGNOSIS — J449 Chronic obstructive pulmonary disease, unspecified: Secondary | ICD-10-CM | POA: Diagnosis not present

## 2015-06-19 DIAGNOSIS — S7001XA Contusion of right hip, initial encounter: Secondary | ICD-10-CM | POA: Diagnosis not present

## 2015-06-28 DIAGNOSIS — J45901 Unspecified asthma with (acute) exacerbation: Secondary | ICD-10-CM | POA: Diagnosis not present

## 2015-06-28 DIAGNOSIS — M79676 Pain in unspecified toe(s): Secondary | ICD-10-CM | POA: Diagnosis not present

## 2015-07-10 DIAGNOSIS — R413 Other amnesia: Secondary | ICD-10-CM | POA: Diagnosis not present

## 2015-07-26 DIAGNOSIS — M546 Pain in thoracic spine: Secondary | ICD-10-CM | POA: Diagnosis not present

## 2015-07-26 DIAGNOSIS — M25511 Pain in right shoulder: Secondary | ICD-10-CM | POA: Diagnosis not present

## 2015-08-10 DIAGNOSIS — E119 Type 2 diabetes mellitus without complications: Secondary | ICD-10-CM | POA: Diagnosis not present

## 2015-08-10 DIAGNOSIS — H2513 Age-related nuclear cataract, bilateral: Secondary | ICD-10-CM | POA: Diagnosis not present

## 2015-08-10 DIAGNOSIS — H25013 Cortical age-related cataract, bilateral: Secondary | ICD-10-CM | POA: Diagnosis not present

## 2015-08-10 DIAGNOSIS — H524 Presbyopia: Secondary | ICD-10-CM | POA: Diagnosis not present

## 2015-08-13 DIAGNOSIS — R413 Other amnesia: Secondary | ICD-10-CM | POA: Diagnosis not present

## 2015-08-13 DIAGNOSIS — E780 Pure hypercholesterolemia: Secondary | ICD-10-CM | POA: Diagnosis not present

## 2015-08-13 DIAGNOSIS — E119 Type 2 diabetes mellitus without complications: Secondary | ICD-10-CM | POA: Diagnosis not present

## 2015-08-13 DIAGNOSIS — F325 Major depressive disorder, single episode, in full remission: Secondary | ICD-10-CM | POA: Diagnosis not present

## 2015-08-20 ENCOUNTER — Ambulatory Visit: Payer: Medicare Other | Attending: Orthopedic Surgery

## 2015-08-28 DIAGNOSIS — R413 Other amnesia: Secondary | ICD-10-CM | POA: Diagnosis not present

## 2015-08-28 DIAGNOSIS — H539 Unspecified visual disturbance: Secondary | ICD-10-CM | POA: Diagnosis not present

## 2015-09-06 DIAGNOSIS — H2511 Age-related nuclear cataract, right eye: Secondary | ICD-10-CM | POA: Diagnosis not present

## 2015-09-06 DIAGNOSIS — E11329 Type 2 diabetes mellitus with mild nonproliferative diabetic retinopathy without macular edema: Secondary | ICD-10-CM | POA: Diagnosis not present

## 2015-09-06 DIAGNOSIS — H25011 Cortical age-related cataract, right eye: Secondary | ICD-10-CM | POA: Diagnosis not present

## 2015-09-06 DIAGNOSIS — H25041 Posterior subcapsular polar age-related cataract, right eye: Secondary | ICD-10-CM | POA: Diagnosis not present

## 2015-09-10 DIAGNOSIS — Z79899 Other long term (current) drug therapy: Secondary | ICD-10-CM | POA: Diagnosis not present

## 2015-09-10 DIAGNOSIS — E119 Type 2 diabetes mellitus without complications: Secondary | ICD-10-CM | POA: Diagnosis not present

## 2015-09-10 DIAGNOSIS — R413 Other amnesia: Secondary | ICD-10-CM | POA: Diagnosis not present

## 2015-09-13 DIAGNOSIS — L089 Local infection of the skin and subcutaneous tissue, unspecified: Secondary | ICD-10-CM | POA: Diagnosis not present

## 2015-09-13 DIAGNOSIS — M2040 Other hammer toe(s) (acquired), unspecified foot: Secondary | ICD-10-CM | POA: Diagnosis not present

## 2015-09-20 DIAGNOSIS — G603 Idiopathic progressive neuropathy: Secondary | ICD-10-CM | POA: Diagnosis not present

## 2015-09-20 DIAGNOSIS — M205X1 Other deformities of toe(s) (acquired), right foot: Secondary | ICD-10-CM | POA: Diagnosis not present

## 2015-09-20 DIAGNOSIS — L03125 Acute lymphangitis of right lower limb: Secondary | ICD-10-CM | POA: Diagnosis not present

## 2015-09-27 DIAGNOSIS — M79672 Pain in left foot: Secondary | ICD-10-CM | POA: Diagnosis not present

## 2015-09-27 DIAGNOSIS — L600 Ingrowing nail: Secondary | ICD-10-CM | POA: Diagnosis not present

## 2015-09-27 DIAGNOSIS — M2041 Other hammer toe(s) (acquired), right foot: Secondary | ICD-10-CM | POA: Diagnosis not present

## 2015-09-27 DIAGNOSIS — L97519 Non-pressure chronic ulcer of other part of right foot with unspecified severity: Secondary | ICD-10-CM | POA: Diagnosis not present

## 2015-09-29 DIAGNOSIS — M25511 Pain in right shoulder: Secondary | ICD-10-CM | POA: Diagnosis not present

## 2015-09-29 DIAGNOSIS — M7501 Adhesive capsulitis of right shoulder: Secondary | ICD-10-CM | POA: Diagnosis not present

## 2015-09-29 DIAGNOSIS — S4991XA Unspecified injury of right shoulder and upper arm, initial encounter: Secondary | ICD-10-CM | POA: Diagnosis not present

## 2015-09-29 DIAGNOSIS — M25311 Other instability, right shoulder: Secondary | ICD-10-CM | POA: Diagnosis not present

## 2015-10-02 DIAGNOSIS — S40021A Contusion of right upper arm, initial encounter: Secondary | ICD-10-CM | POA: Diagnosis not present

## 2015-10-02 DIAGNOSIS — M25311 Other instability, right shoulder: Secondary | ICD-10-CM | POA: Diagnosis not present

## 2015-10-02 DIAGNOSIS — M25511 Pain in right shoulder: Secondary | ICD-10-CM | POA: Diagnosis not present

## 2015-10-02 DIAGNOSIS — M7501 Adhesive capsulitis of right shoulder: Secondary | ICD-10-CM | POA: Diagnosis not present

## 2015-10-04 ENCOUNTER — Other Ambulatory Visit: Payer: Self-pay | Admitting: Sports Medicine

## 2015-10-04 DIAGNOSIS — M25511 Pain in right shoulder: Secondary | ICD-10-CM

## 2015-10-05 DIAGNOSIS — R413 Other amnesia: Secondary | ICD-10-CM | POA: Diagnosis not present

## 2015-10-08 DIAGNOSIS — L97519 Non-pressure chronic ulcer of other part of right foot with unspecified severity: Secondary | ICD-10-CM | POA: Diagnosis not present

## 2015-10-08 DIAGNOSIS — M2041 Other hammer toe(s) (acquired), right foot: Secondary | ICD-10-CM | POA: Diagnosis not present

## 2015-10-09 DIAGNOSIS — H2511 Age-related nuclear cataract, right eye: Secondary | ICD-10-CM | POA: Diagnosis not present

## 2015-10-15 ENCOUNTER — Other Ambulatory Visit: Payer: Medicare Other

## 2015-10-15 ENCOUNTER — Inpatient Hospital Stay: Admission: RE | Admit: 2015-10-15 | Payer: Medicare Other | Source: Ambulatory Visit

## 2015-10-18 ENCOUNTER — Other Ambulatory Visit: Payer: Medicare Other

## 2015-10-22 DIAGNOSIS — E1140 Type 2 diabetes mellitus with diabetic neuropathy, unspecified: Secondary | ICD-10-CM | POA: Diagnosis not present

## 2015-10-22 DIAGNOSIS — Z23 Encounter for immunization: Secondary | ICD-10-CM | POA: Diagnosis not present

## 2015-10-22 DIAGNOSIS — R413 Other amnesia: Secondary | ICD-10-CM | POA: Diagnosis not present

## 2015-10-25 DIAGNOSIS — L97519 Non-pressure chronic ulcer of other part of right foot with unspecified severity: Secondary | ICD-10-CM | POA: Diagnosis not present

## 2015-10-25 DIAGNOSIS — L03032 Cellulitis of left toe: Secondary | ICD-10-CM | POA: Diagnosis not present

## 2015-10-29 ENCOUNTER — Ambulatory Visit
Admission: RE | Admit: 2015-10-29 | Discharge: 2015-10-29 | Disposition: A | Discharge status: Home / Self Care | Payer: Medicare Other | Source: Ambulatory Visit | Attending: Sports Medicine | Admitting: Sports Medicine

## 2015-10-29 ENCOUNTER — Ambulatory Visit (INDEPENDENT_AMBULATORY_CARE_PROVIDER_SITE_OTHER): Payer: Medicare Other | Admitting: Neurology

## 2015-10-29 ENCOUNTER — Encounter: Payer: Self-pay | Admitting: Neurology

## 2015-10-29 VITALS — BP 132/62 | HR 76 | Ht 68.0 in | Wt 157.0 lb

## 2015-10-29 DIAGNOSIS — M75101 Unspecified rotator cuff tear or rupture of right shoulder, not specified as traumatic: Secondary | ICD-10-CM | POA: Diagnosis not present

## 2015-10-29 DIAGNOSIS — M25511 Pain in right shoulder: Secondary | ICD-10-CM

## 2015-10-29 DIAGNOSIS — F32A Depression, unspecified: Secondary | ICD-10-CM

## 2015-10-29 DIAGNOSIS — F329 Major depressive disorder, single episode, unspecified: Secondary | ICD-10-CM

## 2015-10-29 DIAGNOSIS — F0281 Dementia in other diseases classified elsewhere with behavioral disturbance: Secondary | ICD-10-CM

## 2015-10-29 DIAGNOSIS — F03918 Unspecified dementia, unspecified severity, with other behavioral disturbance: Secondary | ICD-10-CM

## 2015-10-29 DIAGNOSIS — N189 Chronic kidney disease, unspecified: Secondary | ICD-10-CM

## 2015-10-29 DIAGNOSIS — G308 Other Alzheimer's disease: Secondary | ICD-10-CM | POA: Diagnosis not present

## 2015-10-29 DIAGNOSIS — G309 Alzheimer's disease, unspecified: Principal | ICD-10-CM

## 2015-10-29 DIAGNOSIS — E1141 Type 2 diabetes mellitus with diabetic mononeuropathy: Secondary | ICD-10-CM

## 2015-10-29 DIAGNOSIS — F0391 Unspecified dementia with behavioral disturbance: Secondary | ICD-10-CM

## 2015-10-29 MED ORDER — RIVASTIGMINE 4.6 MG/24HR TD PT24: 4.6000 mg | MEDICATED_PATCH | Freq: Every day | TRANSDERMAL | Status: DC

## 2015-10-29 MED ORDER — IOHEXOL 180 MG/ML  SOLN
15.0000 mL | Freq: Once | INTRAMUSCULAR | Status: DC | PRN
  Administered 2015-10-29: 15 mL via INTRA_ARTICULAR

## 2015-10-29 NOTE — Patient Instructions (Signed)
Overall you are doing fairly well but I do want to suggest a few things today:   Remember to drink plenty of fluid, eat healthy meals and do not skip any meals. Try to eat protein with a every meal and eat a healthy snack such as fruit or nuts in between meals. Try to keep a regular sleep-wake schedule and try to exercise daily, particularly in the form of walking, 20-30 minutes a day, if you can.   As far as your medications are concerned, I would like to suggest; Start Exelon patch daily  I would like to see you back in 4-6 months, sooner if we need to. Please call us with any interim questions, concerns, problems, updates or refill requests.   Please also call us for any test results so we can go over those with you on the phone.  My clinical assistant and will answer any of your questions and relay your messages to me and also relay most of my messages to you.   Our phone number is [phone]. We also have an after hours call service for urgent matters and there is a physician on-call for urgent questions. For any emergencies you know to call 911 or go to the nearest emergency room
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
## 2015-10-30 ENCOUNTER — Encounter: Payer: Self-pay | Admitting: Neurology

## 2015-10-30 DIAGNOSIS — E1149 Type 2 diabetes mellitus with other diabetic neurological complication: Secondary | ICD-10-CM | POA: Insufficient documentation

## 2015-10-30 DIAGNOSIS — F329 Major depressive disorder, single episode, unspecified: Secondary | ICD-10-CM | POA: Insufficient documentation

## 2015-10-30 DIAGNOSIS — F03918 Unspecified dementia, unspecified severity, with other behavioral disturbance: Secondary | ICD-10-CM | POA: Insufficient documentation

## 2015-10-30 DIAGNOSIS — F32A Depression, unspecified: Secondary | ICD-10-CM | POA: Insufficient documentation

## 2015-10-30 DIAGNOSIS — N189 Chronic kidney disease, unspecified: Secondary | ICD-10-CM | POA: Insufficient documentation

## 2015-10-30 DIAGNOSIS — F0391 Unspecified dementia with behavioral disturbance: Secondary | ICD-10-CM | POA: Insufficient documentation

## 2015-11-01 DIAGNOSIS — L97519 Non-pressure chronic ulcer of other part of right foot with unspecified severity: Secondary | ICD-10-CM | POA: Diagnosis not present

## 2015-11-06 DIAGNOSIS — M19011 Primary osteoarthritis, right shoulder: Secondary | ICD-10-CM | POA: Diagnosis not present

## 2015-11-06 DIAGNOSIS — S46011D Strain of muscle(s) and tendon(s) of the rotator cuff of right shoulder, subsequent encounter: Secondary | ICD-10-CM | POA: Diagnosis not present

## 2015-11-08 DIAGNOSIS — H25012 Cortical age-related cataract, left eye: Secondary | ICD-10-CM | POA: Diagnosis not present

## 2015-11-08 DIAGNOSIS — H2512 Age-related nuclear cataract, left eye: Secondary | ICD-10-CM | POA: Diagnosis not present

## 2015-11-12 ENCOUNTER — Telehealth: Payer: Self-pay | Admitting: Neurology

## 2015-11-12 NOTE — Telephone Encounter (Signed)
I contacted the pharmacy to obtain prescription benefit info so we may contact them regarding Rivastigmine.  Spoke with Joe.  He stated the plan was through [facility] ID # [id number]  Ins has been contacted and provided with clinical info.  Request is currently under review Ref # [id number]  Terrence Dupont is kindly going to check with Dr Jaynee Eagles about studies upon her return, as she is out of the office this week.

## 2015-11-12 NOTE — Telephone Encounter (Signed)
Patient's daughter [name] is calling in regard to Rx Rivastigmine 4.6 mg patches.  She states the insurance company will not fill this without authorization from the doctor.  Also [name] is wondering if there is a study on Dementia that her mother might benefit from.  Please call.

## 2015-11-12 NOTE — Telephone Encounter (Signed)
Called and spoke with [name], pt daughter. Advised Janett Billow is working on the prior [place] through [employer]. Request is under review. She stated she wants to try to see if her mother qualifies for study on Dementia. She was talking with a neighbor and he mentioned that there might be a study she qualifies for regarding dementia. If not here, she knows there is a study at [facility]. I advised Dr Jaynee Eagles is out this week and I will call her as soon as I speak with her. She verbalized understanding. Told her to call if she had further questions. b

## 2015-11-13 DIAGNOSIS — H538 Other visual disturbances: Secondary | ICD-10-CM | POA: Diagnosis not present

## 2015-11-13 DIAGNOSIS — H2512 Age-related nuclear cataract, left eye: Secondary | ICD-10-CM | POA: Diagnosis not present

## 2015-11-13 NOTE — Telephone Encounter (Addendum)
[employer] [facility]) has approved the request for coverage on Rivastigmine Patches effective until 12/28/2016 Ref # [id number] Ins has notified the patient, we have notified the pharmacy

## 2015-11-13 NOTE — Telephone Encounter (Signed)
Spoke to [employer]. She doesn't qualify for our study. Let her know thanks

## 2015-11-13 NOTE — Telephone Encounter (Signed)
Called daughter, [name]. Could not LVM. Mailbox full. Let her know mother does not qualify for dementia study. I would go ahead and check with [facility] to see if she qualifies for that one.

## 2015-11-13 NOTE — Telephone Encounter (Signed)
Spoke w/ Rizwan. Gave pt name and MRN number. Darrick Meigs is going to review chart and call pt if she qualifies. I called daughter, [name], to let her know they will call her to let her know if she qualifies. She verbalized understanding.

## 2015-11-13 NOTE — Telephone Encounter (Signed)
Melissa Fischer, please let Melissa Fischer know and he can review her chart for the study. Let patient know the research team will review and get back to them if she does qualify thanks. thanks

## 2015-12-26 NOTE — Telephone Encounter (Signed)
Ok will defer to dr. Jaynee Eagles. -VRP

## 2015-12-26 NOTE — Telephone Encounter (Signed)
Pt's daughter [phone] called inquiring about study. She is wanting to know [facility] phone number and why pt did not qualify for study at [facility]. I relayed to [name] Dr Jaynee Eagles would be back in office tomorrow and she is ok to wait for return call tomorrow

## 2015-12-27 DIAGNOSIS — L84 Corns and callosities: Secondary | ICD-10-CM | POA: Diagnosis not present

## 2015-12-27 DIAGNOSIS — I739 Peripheral vascular disease, unspecified: Secondary | ICD-10-CM | POA: Diagnosis not present

## 2015-12-27 DIAGNOSIS — L603 Nail dystrophy: Secondary | ICD-10-CM | POA: Diagnosis not present

## 2015-12-27 DIAGNOSIS — M2041 Other hammer toe(s) (acquired), right foot: Secondary | ICD-10-CM | POA: Diagnosis not present

## 2015-12-27 DIAGNOSIS — M79672 Pain in left foot: Secondary | ICD-10-CM | POA: Diagnosis not present

## 2015-12-27 DIAGNOSIS — E1151 Type 2 diabetes mellitus with diabetic peripheral angiopathy without gangrene: Secondary | ICD-10-CM | POA: Diagnosis not present

## 2015-12-27 NOTE — Telephone Encounter (Signed)
Melissa Fischer, please let daughter know that her mother scored too low on the memory test. Unfortumately we are given strict parameters from the study, we don't make the parameters. We are happy to give her a referral to a memory clinic at [facility] or [place] if she wants to further explore. thanks

## 2015-12-27 NOTE — Telephone Encounter (Signed)
Called [name] back. LVM with detailed message. Gave [facility] phone number and hours. Asked her to call back if she would like referral placed .

## 2016-01-21 DIAGNOSIS — E119 Type 2 diabetes mellitus without complications: Secondary | ICD-10-CM | POA: Diagnosis not present

## 2016-01-21 DIAGNOSIS — G309 Alzheimer's disease, unspecified: Secondary | ICD-10-CM | POA: Diagnosis not present

## 2016-01-21 DIAGNOSIS — Z950 Presence of cardiac pacemaker: Secondary | ICD-10-CM | POA: Diagnosis not present

## 2016-01-21 DIAGNOSIS — Z23 Encounter for immunization: Secondary | ICD-10-CM | POA: Diagnosis not present

## 2016-01-21 DIAGNOSIS — R42 Dizziness and giddiness: Secondary | ICD-10-CM | POA: Diagnosis not present

## 2016-01-25 ENCOUNTER — Encounter: Payer: Self-pay | Admitting: Internal Medicine

## 2016-01-30 ENCOUNTER — Encounter: Payer: Self-pay | Admitting: Internal Medicine

## 2016-01-30 ENCOUNTER — Ambulatory Visit (INDEPENDENT_AMBULATORY_CARE_PROVIDER_SITE_OTHER): Payer: Medicare Other | Admitting: Internal Medicine

## 2016-01-30 VITALS — BP 136/68 | HR 75 | Ht 68.0 in | Wt 157.4 lb

## 2016-01-30 DIAGNOSIS — Z950 Presence of cardiac pacemaker: Secondary | ICD-10-CM | POA: Diagnosis not present

## 2016-01-30 LAB — CUP PACEART INCLINIC DEVICE CHECK
Battery Remaining Longevity: 70 mo
Battery Voltage: 3 V
Brady Statistic AP VP Percent: 0.01 %
Brady Statistic AP VS Percent: 5.97 %
Brady Statistic RV Percent Paced: 0.13 %
Date Time Interrogation Session: 20170201120339
Implantable Lead Location: 753859
Lead Channel Impedance Value: 1026 Ohm
Lead Channel Impedance Value: 1178 Ohm
Lead Channel Pacing Threshold Amplitude: 0.5 V
Lead Channel Pacing Threshold Pulse Width: 0.4 ms
Lead Channel Sensing Intrinsic Amplitude: 10.75 mV
Lead Channel Sensing Intrinsic Amplitude: 3.125 mV
Lead Channel Sensing Intrinsic Amplitude: 3.125 mV
Lead Channel Setting Pacing Pulse Width: 0.4 ms
Lead Channel Setting Sensing Sensitivity: 0.9 mV
MDC IDC LEAD IMPLANT DT: 20111208
MDC IDC LEAD IMPLANT DT: 20111208
MDC IDC LEAD LOCATION: 753860
MDC IDC MSMT LEADCHNL RA IMPEDANCE VALUE: 380 Ohm
MDC IDC MSMT LEADCHNL RA IMPEDANCE VALUE: 399 Ohm
MDC IDC MSMT LEADCHNL RV PACING THRESHOLD AMPLITUDE: 1.25 V
MDC IDC MSMT LEADCHNL RV PACING THRESHOLD PULSEWIDTH: 0.4 ms
MDC IDC MSMT LEADCHNL RV SENSING INTR AMPL: 14.125 mV
MDC IDC SET LEADCHNL RA PACING AMPLITUDE: 2 V
MDC IDC SET LEADCHNL RV PACING AMPLITUDE: 2.5 V
MDC IDC STAT BRADY AS VP PERCENT: 0.12 %
MDC IDC STAT BRADY AS VS PERCENT: 93.9 %
MDC IDC STAT BRADY RA PERCENT PACED: 5.99 %

## 2016-01-30 NOTE — Patient Instructions (Signed)
Medication Instructions: - Your physician recommends that you continue on your current medications as directed. Please refer to the Current Medication list given to you today.  Labwork: - none  Procedures/Testing: - none  Follow-Up: - Remote monitoring is used to monitor your Pacemaker of ICD from home. This monitoring reduces the number of office visits required to check your device to one time per year. It allows [place] to keep an eye on the functioning of your device to ensure it is working properly. You are scheduled for a device check from home on 04/30/16. You may send your transmission at any time that day. If you have a wireless device, the transmission will be sent automatically. After your physician reviews your transmission, you will receive a postcard with your next transmission date.  - Your physician wants you to follow-up in: December 2017 with Chanetta Marshall, NP for Dr. Caryl Comes. You will receive a reminder letter in the mail two months in advance. If you don't receive a letter, please call our office to schedule the follow-up appointment.  Any Additional Special Instructions Will Be Listed Below (If Applicable).     If you need a refill on your cardiac medications before your next appointment, please call your pharmacy.

## 2016-01-30 NOTE — Progress Notes (Signed)
ELECTROPHYSIOLOGY CONSULT NOTE  Patient ID: Melissa Fischer, MRN: [id number], DOB/AGE: [birth year] 82 y.o. Admit date: (Not on file) Date of Consult: 01/30/2016  Primary Physician: Mathews Argyle, MD Primary Cardiologist: new Consulting Physician stoneking  Chief Complaint: pacemaker to establish   HPI Melissa Fischer is a 82 y.o. female  Referred from Dr. Felipa Eth to establish pacemaker follow-up.  In 2011, she had syncope. She underwent pacemaker implantation. She's had no recurrent syncope. She paces in her atrium infrequently never.  She and her daughter denies chest pain shortness of breath or peripheral edema; she has no palpitations.  . Records from Dr Felipa Eth were reviewed    Past Medical History  Diagnosis Date  . Diabetes ([place])   . Depression   . Memory loss   . Bronchitis   . Hypercholesteremia   . Peripheral neuropathy ([place])   . COPD (chronic obstructive pulmonary disease) ([place])   . Renal disease   . Shoulder pain, left   . Alzheimer disease   . Syncope   . Pacemaker     Medtronic DOI 2011      Surgical History:  Past Surgical History  Procedure Laterality Date  . No surgical history       Home Meds: Prior to Admission medications   Medication Sig Start Date End Date Taking? Authorizing Provider  Alpha-Lipoic Acid 200 MG TABS Take 1 tablet by mouth daily.   Yes Historical Provider, MD  Coenzyme Q10 (EQL COQ10) 300 MG CAPS Take 1 capsule by mouth daily.   Yes Historical Provider, MD  fexofenadine (ALLEGRA) 180 MG tablet Take 180 mg by mouth daily.   Yes Historical Provider, MD  LUTEIN-ZEAXANTHIN PO Take 1 tablet by mouth daily.   Yes Historical Provider, MD  Magnesium 400 MG CAPS Take 1 capsule by mouth daily.   Yes Historical Provider, MD  metFORMIN (GLUCOPHAGE-XR) 500 MG 24 hr tablet Take 500 mg by mouth daily. 01/23/16  Yes Historical Provider, MD  mometasone-formoterol (DULERA) 200-5 MCG/ACT AERO Inhale 2 puffs into the lungs 2 (two)  times daily.   Yes Historical Provider, MD  Pantothenic Acid 500 MG TABS Take 1 tablet by mouth daily.   Yes Historical Provider, MD  rivastigmine (EXELON) 4.6 mg/24hr Place 1 patch (4.6 mg total) onto the skin daily. 10/29/15  Yes Melvenia Beam, MD  vitamin B-12 (CYANOCOBALAMIN) 1000 MCG tablet Take 1,000 mcg by mouth daily.   Yes Historical Provider, MD    Allergies:  Allergies  Allergen Reactions  . Aricept [Donepezil]     Leg cramps  . Namenda [Memantine]     dizziness  . Penicillins   . Sulfa Antibiotics     Social History   Social History  . Marital Status: Divorced    Spouse Name: N/A  . Number of Children: 2  . Years of Education: 16   Occupational History  . Not on file.   Social History Main Topics  . Smoking status: Former Smoker    Quit date: 12/29/1974  . Smokeless tobacco: Not on file  . Alcohol Use: 0.0 oz/week    0 Standard drinks or equivalent per week     Comment: 1 drinks per day (10/29/15)  . Drug Use: No  . Sexual Activity: Not on file   Other Topics Concern  . Not on file   Social History Narrative   Lives at home with daughter, [name]   Caffeine use: 1 drink per day     Family History  Problem Relation Age of Onset  . Diabetes Mother   . Dementia Neg Hx      ROS:  Please see the history of present illness.     All other systems reviewed and negative.    Physical Exam:*   Blood pressure 136/68, pulse 75, height 5\' 8"  (1.727 m), weight 157 lb 6.4 oz (71.396 kg). General: Well developed, well nourished female in no acute distress. Head: Normocephalic, atraumatic, sclera non-icteric, no xanthomas, nares are without discharge. EENT: normal  Lymph Nodes:  none Neck: Negative for carotid bruits. JVD not elevated. Back:without scoliosis kyphosis*  Lungs: Clear bilaterally to auscultation without wheezes, rales, or rhonchi. Breathing is unlabored. Device pocket well healed; without hematoma or erythema.  There is no tethering Heart: RRR  with S1 S2. No  * murmur . No rubs, or gallops appreciated. Abdomen: Soft, non-tender, non-distended with normoactive bowel sounds. No hepatomegaly. No rebound/guarding. No obvious abdominal masses. Msk:  Strength and tone appear normal for age. Extremities: No clubbing or cyanosis. No * edema.  Distal pedal pulses are 2+ and equal bilaterally. Skin: Warm and Dry Neuro: Alert and oriented X 2  She thinks is 2011. CN III-XII intact Grossly normal sensory and motor function . Psych:  Responds to questions appropriately with a normal affect.        EKG:  Sinus rhythm at 75 Intervals 19/08/39%   axis left -31   Assessment and Plan:  Syncope  Pacemaker-Medtronic  Dementia   The patient has a pacemaker implanted for syncope; there is been no recurrent syncope.  The device was reprogrammed for our parameters. She is set up for remote monitoring via [employer].   Virl Axe

## 2016-02-21 DIAGNOSIS — M79672 Pain in left foot: Secondary | ICD-10-CM | POA: Diagnosis not present

## 2016-02-21 DIAGNOSIS — S6992XA Unspecified injury of left wrist, hand and finger(s), initial encounter: Secondary | ICD-10-CM | POA: Diagnosis not present

## 2016-02-21 DIAGNOSIS — M79642 Pain in left hand: Secondary | ICD-10-CM | POA: Diagnosis not present

## 2016-02-21 DIAGNOSIS — R2232 Localized swelling, mass and lump, left upper limb: Secondary | ICD-10-CM | POA: Diagnosis not present

## 2016-02-21 DIAGNOSIS — M19042 Primary osteoarthritis, left hand: Secondary | ICD-10-CM | POA: Diagnosis not present

## 2016-02-28 DIAGNOSIS — S46011D Strain of muscle(s) and tendon(s) of the rotator cuff of right shoulder, subsequent encounter: Secondary | ICD-10-CM | POA: Diagnosis not present

## 2016-03-10 DIAGNOSIS — S46011D Strain of muscle(s) and tendon(s) of the rotator cuff of right shoulder, subsequent encounter: Secondary | ICD-10-CM | POA: Diagnosis not present

## 2016-03-11 DIAGNOSIS — R42 Dizziness and giddiness: Secondary | ICD-10-CM | POA: Diagnosis not present

## 2016-03-12 DIAGNOSIS — S46011D Strain of muscle(s) and tendon(s) of the rotator cuff of right shoulder, subsequent encounter: Secondary | ICD-10-CM | POA: Diagnosis not present

## 2016-03-20 DIAGNOSIS — S46011D Strain of muscle(s) and tendon(s) of the rotator cuff of right shoulder, subsequent encounter: Secondary | ICD-10-CM | POA: Diagnosis not present

## 2016-03-31 ENCOUNTER — Ambulatory Visit (INDEPENDENT_AMBULATORY_CARE_PROVIDER_SITE_OTHER): Payer: Medicare Other | Admitting: Neurology

## 2016-03-31 ENCOUNTER — Encounter: Payer: Self-pay | Admitting: Neurology

## 2016-03-31 VITALS — BP 130/75 | HR 98 | Ht 71.0 in | Wt 154.8 lb

## 2016-03-31 DIAGNOSIS — E538 Deficiency of other specified B group vitamins: Secondary | ICD-10-CM | POA: Diagnosis not present

## 2016-03-31 DIAGNOSIS — G309 Alzheimer's disease, unspecified: Secondary | ICD-10-CM

## 2016-03-31 DIAGNOSIS — F0281 Dementia in other diseases classified elsewhere with behavioral disturbance: Secondary | ICD-10-CM | POA: Insufficient documentation

## 2016-03-31 DIAGNOSIS — F03918 Unspecified dementia, unspecified severity, with other behavioral disturbance: Secondary | ICD-10-CM

## 2016-03-31 DIAGNOSIS — G308 Other Alzheimer's disease: Secondary | ICD-10-CM | POA: Diagnosis not present

## 2016-03-31 DIAGNOSIS — F02818 Dementia in other diseases classified elsewhere, unspecified severity, with other behavioral disturbance: Secondary | ICD-10-CM

## 2016-03-31 DIAGNOSIS — F0391 Unspecified dementia with behavioral disturbance: Secondary | ICD-10-CM | POA: Diagnosis not present

## 2016-03-31 DIAGNOSIS — Z79899 Other long term (current) drug therapy: Secondary | ICD-10-CM | POA: Diagnosis not present

## 2016-03-31 NOTE — Patient Instructions (Signed)
Remember to drink plenty of fluid, eat healthy meals and do not skip any meals. Try to eat protein with a every meal and eat a healthy snack such as fruit or nuts in between meals. Try to keep a regular sleep-wake schedule and try to exercise daily, particularly in the form of walking, 20-30 minutes a day, if you can.   As far as diagnostic testing: Labs  I would like to see you back in 6 months, sooner if we need to. Please call us with any interim questions, concerns, problems, updates or refill requests.   Our phone number is [phone]. We also have an after hours call service for urgent matters and there is a physician on-call for urgent questions. For any emergencies you know to call 911 or go to the nearest emergency room
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
## 2016-04-01 LAB — THYROID PANEL WITH TSH
FREE THYROXINE INDEX: 2.1 (ref 1.2–4.9)
T3 Uptake Ratio: 34 % (ref 24–39)
T4 TOTAL: 6.1 ug/dL (ref 4.5–12.0)
TSH: 4.93 u[IU]/mL — ABNORMAL HIGH (ref 0.450–4.500)

## 2016-04-01 LAB — RPR: RPR: NONREACTIVE

## 2016-04-01 LAB — B12 AND FOLATE PANEL
Folate: >20 ng/mL (ref >3.0)
Vitamin B-12: 561 pg/mL (ref 211–946)

## 2016-04-03 ENCOUNTER — Telehealth: Payer: Self-pay

## 2016-04-03 NOTE — Telephone Encounter (Signed)
-----   Message from Melvenia Beam, MD sent at 04/03/2016  3:20 PM EDT ----- Patient's B12 was normal. Her thyroid (TSH) was slightly elevated but her other thyroid hormones looked good. This can be mild/subclinical hypothyroidism. She should follow up with her primary care about it. Please forward results to primary care thanks.

## 2016-04-03 NOTE — Telephone Encounter (Signed)
Spoke to pt and advised her that her B12 was normal, but her TSH was slightly elevated which could be mild/subclinical hypothyroidism. Pt verbalized understanding. Pt reports that her PCP is Dr. Felipa Eth and asking that I fax the results to him.

## 2016-04-08 ENCOUNTER — Telehealth: Payer: Self-pay

## 2016-04-08 NOTE — Telephone Encounter (Signed)
I left a message for the patient's daughter, [name], to call me back. Also, I spoke to the patient, and Melissa Fischer asked to be called back later.

## 2016-04-11 ENCOUNTER — Telehealth: Payer: Self-pay

## 2016-04-11 NOTE — Telephone Encounter (Signed)
I spoke to the patient's daughter, Jani Gravel, in re the CREAD study. The patient qualifies for the study, and a memory test visit was scheduled for [id number] at 10:00h.

## 2016-04-24 ENCOUNTER — Encounter (INDEPENDENT_AMBULATORY_CARE_PROVIDER_SITE_OTHER): Payer: Self-pay

## 2016-04-24 DIAGNOSIS — Z0289 Encounter for other administrative examinations: Secondary | ICD-10-CM

## 2016-04-25 ENCOUNTER — Encounter (HOSPITAL_COMMUNITY): Payer: Self-pay | Admitting: Emergency Medicine

## 2016-04-25 ENCOUNTER — Inpatient Hospital Stay (HOSPITAL_COMMUNITY)
Admission: EM | Admit: 2016-04-25 | Discharge: 2016-04-29 | DRG: 482 | Disposition: A | Discharge status: Skilled Nursing Facility | Payer: Medicare Other | Attending: Internal Medicine | Admitting: Internal Medicine

## 2016-04-25 ENCOUNTER — Emergency Department (HOSPITAL_COMMUNITY): Payer: Medicare Other

## 2016-04-25 DIAGNOSIS — M810 Age-related osteoporosis without current pathological fracture: Secondary | ICD-10-CM | POA: Diagnosis present

## 2016-04-25 DIAGNOSIS — S72001D Fracture of unspecified part of neck of right femur, subsequent encounter for closed fracture with routine healing: Secondary | ICD-10-CM

## 2016-04-25 DIAGNOSIS — M25551 Pain in right hip: Secondary | ICD-10-CM | POA: Diagnosis not present

## 2016-04-25 DIAGNOSIS — F0281 Dementia in other diseases classified elsewhere with behavioral disturbance: Secondary | ICD-10-CM | POA: Diagnosis present

## 2016-04-25 DIAGNOSIS — J449 Chronic obstructive pulmonary disease, unspecified: Secondary | ICD-10-CM | POA: Diagnosis not present

## 2016-04-25 DIAGNOSIS — E1165 Type 2 diabetes mellitus with hyperglycemia: Secondary | ICD-10-CM | POA: Diagnosis not present

## 2016-04-25 DIAGNOSIS — Z833 Family history of diabetes mellitus: Secondary | ICD-10-CM

## 2016-04-25 DIAGNOSIS — S72011A Unspecified intracapsular fracture of right femur, initial encounter for closed fracture: Secondary | ICD-10-CM | POA: Diagnosis not present

## 2016-04-25 DIAGNOSIS — S72009A Fracture of unspecified part of neck of unspecified femur, initial encounter for closed fracture: Secondary | ICD-10-CM | POA: Diagnosis present

## 2016-04-25 DIAGNOSIS — S0990XA Unspecified injury of head, initial encounter: Secondary | ICD-10-CM | POA: Diagnosis not present

## 2016-04-25 DIAGNOSIS — S0083XA Contusion of other part of head, initial encounter: Secondary | ICD-10-CM | POA: Diagnosis not present

## 2016-04-25 DIAGNOSIS — S0003XA Contusion of scalp, initial encounter: Secondary | ICD-10-CM | POA: Diagnosis present

## 2016-04-25 DIAGNOSIS — E785 Hyperlipidemia, unspecified: Secondary | ICD-10-CM | POA: Diagnosis present

## 2016-04-25 DIAGNOSIS — F0280 Dementia in other diseases classified elsewhere without behavioral disturbance: Secondary | ICD-10-CM | POA: Diagnosis present

## 2016-04-25 DIAGNOSIS — G309 Alzheimer's disease, unspecified: Secondary | ICD-10-CM | POA: Diagnosis not present

## 2016-04-25 DIAGNOSIS — Z87891 Personal history of nicotine dependence: Secondary | ICD-10-CM

## 2016-04-25 DIAGNOSIS — Z01818 Encounter for other preprocedural examination: Secondary | ICD-10-CM

## 2016-04-25 DIAGNOSIS — S72001A Fracture of unspecified part of neck of right femur, initial encounter for closed fracture: Secondary | ICD-10-CM | POA: Diagnosis not present

## 2016-04-25 DIAGNOSIS — Z419 Encounter for procedure for purposes other than remedying health state, unspecified: Secondary | ICD-10-CM

## 2016-04-25 DIAGNOSIS — Z7984 Long term (current) use of oral hypoglycemic drugs: Secondary | ICD-10-CM

## 2016-04-25 DIAGNOSIS — Z950 Presence of cardiac pacemaker: Secondary | ICD-10-CM

## 2016-04-25 MED ORDER — DIAZEPAM 5 MG/ML IJ SOLN
2.5000 mg | Freq: Once | INTRAMUSCULAR | Status: AC
Start: 2016-04-25 — End: 2016-04-25
  Administered 2016-04-25: 2.5 mg via INTRAVENOUS
  Filled 2016-04-25: qty 2

## 2016-04-25 MED ORDER — HYDROMORPHONE HCL 1 MG/ML IJ SOLN
1.0000 mg | INTRAMUSCULAR | Status: DC | PRN
  Administered 2016-04-25 – 2016-04-26 (×2): 1 mg via INTRAVENOUS
  Filled 2016-04-25 (×2): qty 1

## 2016-04-25 MED ORDER — SODIUM CHLORIDE 0.9 % IV SOLN
INTRAVENOUS | Status: DC
  Administered 2016-04-25: via INTRAVENOUS

## 2016-04-25 NOTE — ED Notes (Signed)
Patient transported to X-ray 

## 2016-04-25 NOTE — ED Provider Notes (Signed)
CSN: [id number]     Arrival date & time 04/25/16  2015 History   First MD Initiated Contact with Patient 04/25/16 2203     Chief Complaint  Patient presents with  . Fall     (Consider location/radiation/quality/duration/timing/severity/associated sxs/prior Treatment) HPI   82 year old female with right hip pain. Onset shortly before arrival. Patient was with her family in trying to get out of the car when she lost her balance and fell onto her right side. She did strike her head. No loss of consciousness. She denies any significant headache, neck or back pain. She is having severe pain in her right hip though. Worse with movement. She has been unable to bear weight on it. No numbness or tingling. No blood thinners.  Past Medical History  Diagnosis Date  . Diabetes ([place])   . Depression   . Memory loss   . Bronchitis   . Hypercholesteremia   . Peripheral neuropathy ([place])   . COPD (chronic obstructive pulmonary disease) ([place])   . Renal disease   . Shoulder pain, left   . Alzheimer disease   . Syncope   . Pacemaker     Medtronic DOI 2011   Past Surgical History  Procedure Laterality Date  . No surgical history     Family History  Problem Relation Age of Onset  . Diabetes Mother   . Dementia Neg Hx    Social History  Substance Use Topics  . Smoking status: Former Smoker    Quit date: 12/29/1974  . Smokeless tobacco: None  . Alcohol Use: 0.0 oz/week    0 Standard drinks or equivalent per week     Comment: 1 drinks per day (10/29/15)   OB History    No data available     Review of Systems  All systems reviewed and negative, other than as noted in HPI.   Allergies  Aricept; Namenda; Penicillins; Statins; and Sulfa antibiotics  Home Medications   Prior to Admission medications   Medication Sig Start Date End Date Taking? Authorizing Provider  fexofenadine (ALLEGRA) 180 MG tablet Take 180 mg by mouth daily.   Yes Historical Provider, MD  ibuprofen (ADVIL) 200 MG  tablet Take 200-400 mg by mouth daily as needed. pain   Yes Historical Provider, MD  metFORMIN (GLUCOPHAGE-XR) 500 MG 24 hr tablet Take 500 mg by mouth daily. 01/23/16  Yes Historical Provider, MD  mometasone-formoterol (DULERA) 200-5 MCG/ACT AERO Inhale 2 puffs into the lungs 2 (two) times daily.   Yes Historical Provider, MD   BP 176/107 mmHg  Pulse 87  Temp(Src) 98.1 F (36.7 C) (Oral)  Resp 20  SpO2 99% Physical Exam  Constitutional: She is oriented to person, place, and time. She appears well-developed and well-nourished. No distress.  HENT:  Hematoma to right temporal region.  Eyes: Conjunctivae are normal. Right eye exhibits no discharge. Left eye exhibits no discharge.  Neck: Neck supple.  Cardiovascular: Normal rate, regular rhythm and normal heart sounds.  Exam reveals no gallop and no friction rub.   No murmur heard. Pulmonary/Chest: Effort normal and breath sounds normal. No respiratory distress.  Abdominal: Soft. She exhibits no distension. There is no tenderness.  Musculoskeletal: She exhibits no edema or tenderness.  No midline spinal tenderness. Right lower extremity is normal to inspection. No shortening or malrotation. Patient has exquisite tenderness to the proximal thigh and hip region. Cannot range secondary to pain. Sensation is intact to light touch. Foot is warm. Good DP pulse.  Neurological: She  is alert and oriented to person, place, and time. No cranial nerve deficit.  Strength 5/5 UE  Skin: Skin is warm and dry.  Psychiatric: She has a normal mood and affect. Her behavior is normal. Thought content normal.  Nursing note and vitals reviewed.   ED Course  Procedures (including critical care time) Labs Review Labs Reviewed  BASIC METABOLIC PANEL - Abnormal; Notable for the following:    Sodium 134 (*)    Chloride 100 (*)    CO2 21 (*)    Glucose, Bld 217 (*)    All other components within normal limits  CBC WITH DIFFERENTIAL/PLATELET    Imaging  Review Dg Chest 1 View  04/26/2016  CLINICAL DATA:  Preop exam for hip fracture EXAM: CHEST 1 VIEW COMPARISON:  None. FINDINGS: Normal heart size and mediastinal contours. Dual-chamber pacer from the left. No acute infiltrate or edema. No effusion or pneumothorax. No acute osseous findings. IMPRESSION: No evidence of active disease or thoracic injury. Electronically Signed   By: Monte Fantasia M.D.   On: 04/26/2016 00:55   Ct Head Wo Contrast  04/25/2016  CLINICAL DATA:  82 year old female with history of trauma from a fall while getting out of a car earlier this evening. Hematoma on the right side of the forehead. EXAM: CT HEAD WITHOUT CONTRAST TECHNIQUE: Contiguous axial images were obtained from the base of the skull through the vertex without intravenous contrast. COMPARISON:  No priors. FINDINGS: Soft tissue swelling in the right frontal scalp laterally where there is some high attenuation, compatible with a small scalp hematoma. Mild cerebral atrophy. Patchy and confluent areas of decreased attenuation are noted throughout the deep and periventricular white matter of the cerebral hemispheres bilaterally, compatible with chronic microvascular ischemic disease. No acute displaced skull fractures are identified. No acute intracranial abnormality. Specifically, no evidence of acute post-traumatic intracranial hemorrhage, no definite regions of acute/subacute cerebral ischemia, no focal mass, mass effect, hydrocephalus or abnormal intra or extra-axial fluid collections. The visualized paranasal sinuses and mastoids are well pneumatized. IMPRESSION: 1. Small right lateral frontal scalp hematoma. 2. No acute displaced skull fractures or evidence of significant acute traumatic injury to the brain. 3. Mild cerebral atrophy with extensive chronic microvascular ischemic changes in the cerebral white matter. Electronically Signed   By: Vinnie Langton M.D.   On: 04/25/2016 23:55   Ct Pelvis Wo  Contrast  04/26/2016  CLINICAL DATA:  Fall with right hip and pelvic pain. Initial encounter. EXAM: CT PELVIS WITHOUT CONTRAST TECHNIQUE: Multidetector CT imaging of the pelvis was performed following the standard protocol without intravenous contrast. Dedicated reformats of the pelvis and right hip were obtained. COMPARISON:  Radiography from late yesterday FINDINGS: Re- demonstrated subcapital right femoral neck fracture with impaction laterally. No involvement along the articular surface of the femoral head. There is no pelvic fracture. Degenerative spurring to the right acetabulum is mild. No hip joint narrowing. No myotendinous disruption. Osteopenia. No posttraumatic finding to the visceral pelvis. Lower lumbar disc and facet degeneration. IMPRESSION: Subcapital right femoral neck fracture with lateral impaction. Electronically Signed   By: Monte Fantasia M.D.   On: 04/26/2016 00:53   Ct Hip Right Wo Contrast  04/26/2016  CLINICAL DATA:  Fall with right hip and pelvic pain. Initial encounter. EXAM: CT PELVIS WITHOUT CONTRAST TECHNIQUE: Multidetector CT imaging of the pelvis was performed following the standard protocol without intravenous contrast. Dedicated reformats of the pelvis and right hip were obtained. COMPARISON:  Radiography from late yesterday FINDINGS: Re-  demonstrated subcapital right femoral neck fracture with impaction laterally. No involvement along the articular surface of the femoral head. There is no pelvic fracture. Degenerative spurring to the right acetabulum is mild. No hip joint narrowing. No myotendinous disruption. Osteopenia. No posttraumatic finding to the visceral pelvis. Lower lumbar disc and facet degeneration. IMPRESSION: Subcapital right femoral neck fracture with lateral impaction. Electronically Signed   By: Monte Fantasia M.D.   On: 04/26/2016 00:53   Dg Hip Unilat With Pelvis 2-3 Views Right  04/25/2016  CLINICAL DATA:  Fall with hip pain on the right.  Initial  encounter. EXAM: DG HIP (WITH OR WITHOUT PELVIS) 2-3V RIGHT COMPARISON:  None. FINDINGS: Right femoral neck fracture with lateral impaction. No notable degenerative changes to the acetabula. No evidence of pelvic ring fracture or diastasis. IMPRESSION: Mildly impacted subcapital right femoral neck fracture. Electronically Signed   By: Monte Fantasia M.D.   On: 04/25/2016 23:44   I have personally reviewed and evaluated these images and lab results as part of my medical decision-making.   EKG Interpretation None       EKG:  Rhythm: normal sinus Rate: 92 Axis: left PR: 241 ms QRS: 96 ms QTc: 457 ms ST segments: nsst changes    MDM   Final diagnoses:  Preop examination  Subcapital fracture of right femur ([place])    82yF presenting after a fall. Likely has a pelvic fracture or broken hip. Unfortunately laid in bed for a couple hours w/o pain medication prior to me seeing her and is understandably upset about this.   Imaging is significant for a right subcapital femoral neck fracture. Closed injury. Neurovascularly intact. CT of the head is without acute abnormality. She has a nonfocal neurological examination. Discussed with Dr. Tamera Punt, orthopedic surgery. He will evaluate patient in the morning. Requesting that she be nothing by mouth after midnight. Patient and family updated   Virgel Manifold, MD 04/26/16 0120

## 2016-04-25 NOTE — ED Notes (Signed)
Per patient and family pt fell while attempting to get out of car. Pt unable to bare weight on L leg, c/o L hip pain, hematoma noted to R side of forehead. Denies LOC, denies blood thinners

## 2016-04-26 ENCOUNTER — Encounter (HOSPITAL_COMMUNITY): Payer: Self-pay | Admitting: Internal Medicine

## 2016-04-26 ENCOUNTER — Inpatient Hospital Stay (HOSPITAL_COMMUNITY): Payer: Medicare Other | Admitting: Anesthesiology

## 2016-04-26 ENCOUNTER — Inpatient Hospital Stay (HOSPITAL_COMMUNITY): Payer: Medicare Other

## 2016-04-26 ENCOUNTER — Emergency Department (HOSPITAL_COMMUNITY): Payer: Medicare Other

## 2016-04-26 ENCOUNTER — Encounter (HOSPITAL_COMMUNITY)
Admission: EM | Disposition: A | Discharge status: Skilled Nursing Facility | Payer: Self-pay | Source: Home / Self Care | Attending: Internal Medicine

## 2016-04-26 DIAGNOSIS — S72001D Fracture of unspecified part of neck of right femur, subsequent encounter for closed fracture with routine healing: Secondary | ICD-10-CM | POA: Diagnosis not present

## 2016-04-26 DIAGNOSIS — M6281 Muscle weakness (generalized): Secondary | ICD-10-CM | POA: Diagnosis not present

## 2016-04-26 DIAGNOSIS — S72009A Fracture of unspecified part of neck of unspecified femur, initial encounter for closed fracture: Secondary | ICD-10-CM | POA: Diagnosis present

## 2016-04-26 DIAGNOSIS — S0003XA Contusion of scalp, initial encounter: Secondary | ICD-10-CM | POA: Diagnosis not present

## 2016-04-26 DIAGNOSIS — S72011A Unspecified intracapsular fracture of right femur, initial encounter for closed fracture: Secondary | ICD-10-CM | POA: Diagnosis not present

## 2016-04-26 DIAGNOSIS — R2681 Unsteadiness on feet: Secondary | ICD-10-CM | POA: Diagnosis not present

## 2016-04-26 DIAGNOSIS — S72091A Other fracture of head and neck of right femur, initial encounter for closed fracture: Secondary | ICD-10-CM | POA: Diagnosis not present

## 2016-04-26 DIAGNOSIS — Z950 Presence of cardiac pacemaker: Secondary | ICD-10-CM | POA: Diagnosis not present

## 2016-04-26 DIAGNOSIS — G308 Other Alzheimer's disease: Secondary | ICD-10-CM | POA: Diagnosis not present

## 2016-04-26 DIAGNOSIS — J449 Chronic obstructive pulmonary disease, unspecified: Secondary | ICD-10-CM

## 2016-04-26 DIAGNOSIS — S72001A Fracture of unspecified part of neck of right femur, initial encounter for closed fracture: Secondary | ICD-10-CM | POA: Diagnosis present

## 2016-04-26 DIAGNOSIS — J438 Other emphysema: Secondary | ICD-10-CM

## 2016-04-26 DIAGNOSIS — Z01818 Encounter for other preprocedural examination: Secondary | ICD-10-CM | POA: Diagnosis not present

## 2016-04-26 DIAGNOSIS — M25551 Pain in right hip: Secondary | ICD-10-CM | POA: Diagnosis not present

## 2016-04-26 DIAGNOSIS — N189 Chronic kidney disease, unspecified: Secondary | ICD-10-CM | POA: Diagnosis not present

## 2016-04-26 DIAGNOSIS — Z87891 Personal history of nicotine dependence: Secondary | ICD-10-CM | POA: Diagnosis not present

## 2016-04-26 DIAGNOSIS — Z4789 Encounter for other orthopedic aftercare: Secondary | ICD-10-CM | POA: Diagnosis not present

## 2016-04-26 DIAGNOSIS — Z7984 Long term (current) use of oral hypoglycemic drugs: Secondary | ICD-10-CM | POA: Diagnosis not present

## 2016-04-26 DIAGNOSIS — S72001K Fracture of unspecified part of neck of right femur, subsequent encounter for closed fracture with nonunion: Secondary | ICD-10-CM | POA: Diagnosis not present

## 2016-04-26 DIAGNOSIS — M810 Age-related osteoporosis without current pathological fracture: Secondary | ICD-10-CM | POA: Diagnosis present

## 2016-04-26 DIAGNOSIS — E119 Type 2 diabetes mellitus without complications: Secondary | ICD-10-CM | POA: Diagnosis not present

## 2016-04-26 DIAGNOSIS — E1165 Type 2 diabetes mellitus with hyperglycemia: Secondary | ICD-10-CM

## 2016-04-26 DIAGNOSIS — Z833 Family history of diabetes mellitus: Secondary | ICD-10-CM | POA: Diagnosis not present

## 2016-04-26 DIAGNOSIS — F0280 Dementia in other diseases classified elsewhere without behavioral disturbance: Secondary | ICD-10-CM | POA: Diagnosis present

## 2016-04-26 DIAGNOSIS — G309 Alzheimer's disease, unspecified: Secondary | ICD-10-CM | POA: Diagnosis present

## 2016-04-26 DIAGNOSIS — E785 Hyperlipidemia, unspecified: Secondary | ICD-10-CM | POA: Diagnosis present

## 2016-04-26 HISTORY — PX: HIP PINNING,CANNULATED: SHX1758

## 2016-04-26 LAB — CBC WITH DIFFERENTIAL/PLATELET
BASOS PCT: 0 %
Basophils Absolute: 0 10*3/uL (ref 0.0–0.1)
Basophils Absolute: 0 10*3/uL (ref 0.0–0.1)
Basophils Relative: 0 %
EOS ABS: 0.1 10*3/uL (ref 0.0–0.7)
Eosinophils Absolute: 0.1 10*3/uL (ref 0.0–0.7)
Eosinophils Relative: 1 %
Eosinophils Relative: 1 %
HCT: 38.8 % (ref 36.0–46.0)
HCT: 40.8 % (ref 36.0–46.0)
Hemoglobin: 13.4 g/dL (ref 12.0–15.0)
Hemoglobin: 14.1 g/dL (ref 12.0–15.0)
Lymphocytes Relative: 18 %
Lymphocytes Relative: 28 %
Lymphs Abs: 1.9 10*3/uL (ref 0.7–4.0)
Lymphs Abs: 2.6 10*3/uL (ref 0.7–4.0)
MCH: 30.8 pg (ref 26.0–34.0)
MCH: 30.9 pg (ref 26.0–34.0)
MCHC: 34.5 g/dL (ref 30.0–36.0)
MCHC: 34.6 g/dL (ref 30.0–36.0)
MCV: 89.2 fL (ref 78.0–100.0)
MCV: 89.5 fL (ref 78.0–100.0)
MONO ABS: 0.7 10*3/uL (ref 0.1–1.0)
MONOS PCT: 7 %
Monocytes Absolute: 0.9 10*3/uL (ref 0.1–1.0)
Monocytes Relative: 9 %
Neutro Abs: 5.8 10*3/uL (ref 1.7–7.7)
Neutro Abs: 7.6 10*3/uL (ref 1.7–7.7)
Neutrophils Relative %: 62 %
Neutrophils Relative %: 74 %
PLATELETS: 193 10*3/uL (ref 150–400)
Platelets: 218 10*3/uL (ref 150–400)
RBC: 4.35 MIL/uL (ref 3.87–5.11)
RBC: 4.56 MIL/uL (ref 3.87–5.11)
RDW: 12.7 % (ref 11.5–15.5)
RDW: 12.7 % (ref 11.5–15.5)
WBC: 10.4 10*3/uL (ref 4.0–10.5)
WBC: 9.5 10*3/uL (ref 4.0–10.5)

## 2016-04-26 LAB — COMPREHENSIVE METABOLIC PANEL
ALBUMIN: 4 g/dL (ref 3.5–5.0)
ALK PHOS: 58 U/L (ref 38–126)
ALT: 19 U/L (ref 14–54)
AST: 23 U/L (ref 15–41)
Anion gap: 11 (ref 5–15)
BUN: 13 mg/dL (ref 6–20)
CALCIUM: 9.1 mg/dL (ref 8.9–10.3)
CO2: 21 mmol/L — AB (ref 22–32)
CREATININE: 0.74 mg/dL (ref 0.44–1.00)
Chloride: 103 mmol/L (ref 101–111)
GFR calc Af Amer: >60 mL/min (ref >60)
GFR calc non Af Amer: >60 mL/min (ref >60)
GLUCOSE: 191 mg/dL — AB (ref 65–99)
Potassium: 4.2 mmol/L (ref 3.5–5.1)
SODIUM: 135 mmol/L (ref 135–145)
Total Bilirubin: 0.9 mg/dL (ref 0.3–1.2)
Total Protein: 6.4 g/dL — ABNORMAL LOW (ref 6.5–8.1)

## 2016-04-26 LAB — BASIC METABOLIC PANEL
Anion gap: 13 (ref 5–15)
BUN: 14 mg/dL (ref 6–20)
CO2: 21 mmol/L — ABNORMAL LOW (ref 22–32)
Calcium: 9.7 mg/dL (ref 8.9–10.3)
Chloride: 100 mmol/L — ABNORMAL LOW (ref 101–111)
Creatinine, Ser: 0.79 mg/dL (ref 0.44–1.00)
GFR calc Af Amer: >60 mL/min (ref >60)
GFR calc non Af Amer: >60 mL/min (ref >60)
Glucose, Bld: 217 mg/dL — ABNORMAL HIGH (ref 65–99)
Potassium: 3.8 mmol/L (ref 3.5–5.1)
Sodium: 134 mmol/L — ABNORMAL LOW (ref 135–145)

## 2016-04-26 LAB — GLUCOSE, CAPILLARY
GLUCOSE-CAPILLARY: 197 mg/dL — AB (ref 65–99)
GLUCOSE-CAPILLARY: 166 mg/dL — AB (ref 65–99)
Glucose-Capillary: 168 mg/dL — ABNORMAL HIGH (ref 65–99)
Glucose-Capillary: 129 mg/dL — ABNORMAL HIGH (ref 65–99)
Glucose-Capillary: 159 mg/dL — ABNORMAL HIGH (ref 65–99)

## 2016-04-26 LAB — TYPE AND SCREEN
ABO/RH(D): A NEG
Antibody Screen: POSITIVE
DAT, IgG: NEGATIVE
PT AG TYPE: NEGATIVE

## 2016-04-26 LAB — SURGICAL PCR SCREEN
MRSA, PCR: NEGATIVE
Staphylococcus aureus: NEGATIVE

## 2016-04-26 SURGERY — FIXATION, FEMUR, NECK, PERCUTANEOUS, USING SCREW
Anesthesia: General | Site: Hip | Laterality: Right

## 2016-04-26 MED ORDER — LORAZEPAM 1 MG PO TABS
1.0000 mg | ORAL_TABLET | Freq: Four times a day (QID) | ORAL | Status: AC | PRN
  Administered 2016-04-28: 1 mg via ORAL
  Filled 2016-04-26: qty 1

## 2016-04-26 MED ORDER — MOMETASONE FURO-FORMOTEROL FUM 200-5 MCG/ACT IN AERO
2.0000 | INHALATION_SPRAY | Freq: Two times a day (BID) | RESPIRATORY_TRACT | Status: DC
  Administered 2016-04-26 – 2016-04-29 (×6): 2 via RESPIRATORY_TRACT
  Filled 2016-04-26: qty 8.8

## 2016-04-26 MED ORDER — HYDROCODONE-ACETAMINOPHEN 5-325 MG PO TABS
1.0000 | ORAL_TABLET | Freq: Four times a day (QID) | ORAL | Status: DC | PRN
  Administered 2016-04-26: 1 via ORAL
  Filled 2016-04-26: qty 1

## 2016-04-26 MED ORDER — FOLIC ACID 1 MG PO TABS
1.0000 mg | ORAL_TABLET | Freq: Every day | ORAL | Status: DC
  Administered 2016-04-26 – 2016-04-29 (×4): 1 mg via ORAL
  Filled 2016-04-26 (×4): qty 1

## 2016-04-26 MED ORDER — SODIUM CHLORIDE 0.9 % IV SOLN
INTRAVENOUS | Status: AC
  Administered 2016-04-26: 05:00:00 via INTRAVENOUS

## 2016-04-26 MED ORDER — 0.9 % SODIUM CHLORIDE (POUR BTL) OPTIME
TOPICAL | Status: DC | PRN
  Administered 2016-04-26: 1000 mL

## 2016-04-26 MED ORDER — MORPHINE SULFATE (PF) 2 MG/ML IV SOLN
INTRAVENOUS | Status: AC
  Filled 2016-04-26: qty 1

## 2016-04-26 MED ORDER — HYDROCODONE-ACETAMINOPHEN 5-325 MG PO TABS
1.0000 | ORAL_TABLET | Freq: Four times a day (QID) | ORAL | Status: DC | PRN
  Administered 2016-04-26 – 2016-04-27 (×3): 1 via ORAL
  Filled 2016-04-26 (×3): qty 1

## 2016-04-26 MED ORDER — ADULT MULTIVITAMIN W/MINERALS CH
1.0000 | ORAL_TABLET | Freq: Every day | ORAL | Status: DC
  Administered 2016-04-28 – 2016-04-29 (×2): 1 via ORAL
  Filled 2016-04-26 (×4): qty 1

## 2016-04-26 MED ORDER — FENTANYL CITRATE (PF) 100 MCG/2ML IJ SOLN
INTRAMUSCULAR | Status: AC
  Filled 2016-04-26: qty 2

## 2016-04-26 MED ORDER — ONDANSETRON HCL 4 MG/2ML IJ SOLN
INTRAMUSCULAR | Status: AC
  Filled 2016-04-26: qty 2

## 2016-04-26 MED ORDER — ONDANSETRON HCL 4 MG PO TABS: 4.0000 mg | ORAL_TABLET | Freq: Four times a day (QID) | ORAL | Status: DC | PRN

## 2016-04-26 MED ORDER — CLINDAMYCIN PHOSPHATE 900 MG/50ML IV SOLN
900.0000 mg | INTRAVENOUS | Status: AC
  Administered 2016-04-26: 900 mg via INTRAVENOUS
  Filled 2016-04-26 (×2): qty 50

## 2016-04-26 MED ORDER — ACETAMINOPHEN 325 MG PO TABS
650.0000 mg | ORAL_TABLET | Freq: Four times a day (QID) | ORAL | Status: DC | PRN
  Filled 2016-04-26: qty 2

## 2016-04-26 MED ORDER — POVIDONE-IODINE 10 % EX SWAB: 2.0000 "application " | Freq: Once | CUTANEOUS | Status: DC

## 2016-04-26 MED ORDER — PROPOFOL 10 MG/ML IV BOLUS
INTRAVENOUS | Status: AC
  Filled 2016-04-26: qty 20

## 2016-04-26 MED ORDER — THIAMINE HCL 100 MG/ML IJ SOLN
100.0000 mg | Freq: Every day | INTRAMUSCULAR | Status: DC
  Administered 2016-04-26: 100 mg via INTRAVENOUS
  Filled 2016-04-26 (×3): qty 1

## 2016-04-26 MED ORDER — LORAZEPAM 2 MG/ML IJ SOLN
1.0000 mg | Freq: Four times a day (QID) | INTRAMUSCULAR | Status: AC | PRN
  Administered 2016-04-27 (×2): 1 mg via INTRAVENOUS
  Filled 2016-04-26 (×2): qty 1

## 2016-04-26 MED ORDER — DIAZEPAM 5 MG/ML IJ SOLN
2.5000 mg | Freq: Once | INTRAMUSCULAR | Status: AC
  Administered 2016-04-26: 2.5 mg via INTRAVENOUS
  Filled 2016-04-26: qty 2

## 2016-04-26 MED ORDER — ACETAMINOPHEN 10 MG/ML IV SOLN
INTRAVENOUS | Status: DC | PRN
  Administered 2016-04-26: 1000 mg via INTRAVENOUS

## 2016-04-26 MED ORDER — ACETAMINOPHEN 10 MG/ML IV SOLN
INTRAVENOUS | Status: AC
  Filled 2016-04-26: qty 100

## 2016-04-26 MED ORDER — SODIUM CHLORIDE 0.9 % IJ SOLN
INTRAMUSCULAR | Status: AC
  Filled 2016-04-26: qty 10

## 2016-04-26 MED ORDER — FENTANYL CITRATE (PF) 100 MCG/2ML IJ SOLN
INTRAMUSCULAR | Status: DC | PRN
  Administered 2016-04-26 (×2): 50 ug via INTRAVENOUS

## 2016-04-26 MED ORDER — CLINDAMYCIN PHOSPHATE 600 MG/50ML IV SOLN
600.0000 mg | Freq: Four times a day (QID) | INTRAVENOUS | Status: AC
  Administered 2016-04-26 – 2016-04-27 (×2): 600 mg via INTRAVENOUS
  Filled 2016-04-26 (×2): qty 50

## 2016-04-26 MED ORDER — METOCLOPRAMIDE HCL 10 MG PO TABS: 5.0000 mg | ORAL_TABLET | Freq: Three times a day (TID) | ORAL | Status: DC | PRN

## 2016-04-26 MED ORDER — MORPHINE SULFATE (PF) 4 MG/ML IV SOLN
4.0000 mg | Freq: Once | INTRAVENOUS | Status: AC
  Administered 2016-04-26: 4 mg via INTRAVENOUS

## 2016-04-26 MED ORDER — PHENOL 1.4 % MT LIQD: 1.0000 | OROMUCOSAL | Status: DC | PRN

## 2016-04-26 MED ORDER — ACETAMINOPHEN 650 MG RE SUPP: 650.0000 mg | Freq: Four times a day (QID) | RECTAL | Status: DC | PRN

## 2016-04-26 MED ORDER — EPHEDRINE SULFATE 50 MG/ML IJ SOLN
INTRAMUSCULAR | Status: AC
  Filled 2016-04-26: qty 1

## 2016-04-26 MED ORDER — CAMPHOR-MENTHOL 0.5-0.5 % EX LOTN
TOPICAL_LOTION | CUTANEOUS | Status: DC | PRN
Start: 2016-04-26 — End: 2016-04-29
  Filled 2016-04-26: qty 222

## 2016-04-26 MED ORDER — LIDOCAINE HCL (CARDIAC) 20 MG/ML IV SOLN
INTRAVENOUS | Status: DC | PRN
  Administered 2016-04-26: 50 mg via INTRAVENOUS

## 2016-04-26 MED ORDER — LIDOCAINE HCL (CARDIAC) 20 MG/ML IV SOLN
INTRAVENOUS | Status: AC
  Filled 2016-04-26: qty 5

## 2016-04-26 MED ORDER — LACTATED RINGERS IV SOLN
INTRAVENOUS | Status: DC | PRN
  Administered 2016-04-26: 13:00:00 via INTRAVENOUS

## 2016-04-26 MED ORDER — FENTANYL CITRATE (PF) 100 MCG/2ML IJ SOLN
25.0000 ug | INTRAMUSCULAR | Status: DC | PRN
  Administered 2016-04-26: 50 ug via INTRAVENOUS
  Administered 2016-04-26 (×2): 25 ug via INTRAVENOUS

## 2016-04-26 MED ORDER — SUCCINYLCHOLINE CHLORIDE 20 MG/ML IJ SOLN
INTRAMUSCULAR | Status: DC | PRN
  Administered 2016-04-26: 100 mg via INTRAVENOUS

## 2016-04-26 MED ORDER — CLINDAMYCIN PHOSPHATE 900 MG/50ML IV SOLN
INTRAVENOUS | Status: AC
Start: 2016-04-26 — End: 2016-04-26
  Filled 2016-04-26: qty 50

## 2016-04-26 MED ORDER — INSULIN ASPART 100 UNIT/ML ~~LOC~~ SOLN
0.0000 [IU] | Freq: Three times a day (TID) | SUBCUTANEOUS | Status: DC
Start: 2016-04-26 — End: 2016-04-29
  Administered 2016-04-26: 1 [IU] via SUBCUTANEOUS
  Administered 2016-04-26 (×2): 2 [IU] via SUBCUTANEOUS
  Administered 2016-04-27 (×2): 1 [IU] via SUBCUTANEOUS
  Administered 2016-04-27 – 2016-04-28 (×3): 2 [IU] via SUBCUTANEOUS
  Administered 2016-04-28: 3 [IU] via SUBCUTANEOUS
  Administered 2016-04-29 (×2): 2 [IU] via SUBCUTANEOUS

## 2016-04-26 MED ORDER — ASPIRIN EC 325 MG PO TBEC
325.0000 mg | DELAYED_RELEASE_TABLET | Freq: Two times a day (BID) | ORAL | Status: DC
  Administered 2016-04-26 – 2016-04-29 (×6): 325 mg via ORAL
  Filled 2016-04-26 (×7): qty 1

## 2016-04-26 MED ORDER — METOCLOPRAMIDE HCL 5 MG/ML IJ SOLN: 5.0000 mg | Freq: Three times a day (TID) | INTRAMUSCULAR | Status: DC | PRN

## 2016-04-26 MED ORDER — ONDANSETRON HCL 4 MG/2ML IJ SOLN
4.0000 mg | Freq: Four times a day (QID) | INTRAMUSCULAR | Status: DC | PRN
  Administered 2016-04-26: 4 mg via INTRAVENOUS
  Filled 2016-04-26: qty 2

## 2016-04-26 MED ORDER — MENTHOL 3 MG MT LOZG: 1.0000 | LOZENGE | OROMUCOSAL | Status: DC | PRN

## 2016-04-26 MED ORDER — MORPHINE SULFATE (PF) 2 MG/ML IV SOLN
0.5000 mg | INTRAVENOUS | Status: DC | PRN
  Administered 2016-04-26 (×2): 0.5 mg via INTRAVENOUS
  Filled 2016-04-26: qty 1

## 2016-04-26 MED ORDER — MORPHINE SULFATE (PF) 2 MG/ML IV SOLN
0.5000 mg | INTRAVENOUS | Status: DC | PRN
  Administered 2016-04-26 – 2016-04-27 (×2): 0.5 mg via INTRAVENOUS
  Filled 2016-04-26 (×2): qty 1

## 2016-04-26 MED ORDER — MORPHINE SULFATE (PF) 4 MG/ML IV SOLN
INTRAVENOUS | Status: AC
  Filled 2016-04-26: qty 1

## 2016-04-26 MED ORDER — VITAMIN B-1 100 MG PO TABS
100.0000 mg | ORAL_TABLET | Freq: Every day | ORAL | Status: DC
  Administered 2016-04-27 – 2016-04-29 (×3): 100 mg via ORAL
  Filled 2016-04-26 (×4): qty 1

## 2016-04-26 MED ORDER — PROPOFOL 10 MG/ML IV BOLUS
INTRAVENOUS | Status: DC | PRN
  Administered 2016-04-26: 30 mg via INTRAVENOUS
  Administered 2016-04-26: 100 mg via INTRAVENOUS

## 2016-04-26 MED ORDER — CHLORHEXIDINE GLUCONATE 4 % EX LIQD
60.0000 mL | Freq: Once | CUTANEOUS | Status: DC
Start: 2016-04-26 — End: 2016-04-26
  Filled 2016-04-26: qty 60

## 2016-04-26 SURGICAL SUPPLY — 34 items
BAG ZIPLOCK 12X15 (MISCELLANEOUS) ×3 IMPLANT
BNDG GAUZE ELAST 4 BULKY (GAUZE/BANDAGES/DRESSINGS) ×3 IMPLANT
CHLORAPREP W/TINT 26ML (MISCELLANEOUS) ×3 IMPLANT
CLOSURE WOUND 1/2 X4 (GAUZE/BANDAGES/DRESSINGS) ×1
DRAPE C-ARMOR (DRAPES) ×3 IMPLANT
DRAPE ORTHO SPLIT 77X108 STRL (DRAPES) ×4
DRAPE SHEET LG 3/4 BI-LAMINATE (DRAPES) ×3 IMPLANT
DRAPE STERI IOBAN 125X83 (DRAPES) ×3 IMPLANT
DRAPE SURG ORHT 6 SPLT 77X108 (DRAPES) ×2 IMPLANT
DRSG MEPILEX BORDER 4X4 (GAUZE/BANDAGES/DRESSINGS) ×6 IMPLANT
DRSG MEPILEX BORDER 4X8 (GAUZE/BANDAGES/DRESSINGS) ×3 IMPLANT
DRSG PAD ABDOMINAL 8X10 ST (GAUZE/BANDAGES/DRESSINGS) ×3 IMPLANT
DURAPREP 26ML APPLICATOR (WOUND CARE) ×3 IMPLANT
ELECT REM PT RETURN 9FT ADLT (ELECTROSURGICAL) ×3
ELECTRODE REM PT RTRN 9FT ADLT (ELECTROSURGICAL) ×1 IMPLANT
GLOVE BIO SURGEON STRL SZ7.5 (GLOVE) ×3 IMPLANT
GLOVE BIOGEL PI IND STRL 8 (GLOVE) ×1 IMPLANT
GLOVE BIOGEL PI INDICATOR 8 (GLOVE) ×2
NS IRRIG 1000ML POUR BTL (IV SOLUTION) ×3 IMPLANT
PACK GENERAL/GYN (CUSTOM PROCEDURE TRAY) ×3 IMPLANT
PAD CAST 4YDX4 CTTN HI CHSV (CAST SUPPLIES) ×1 IMPLANT
PADDING CAST COTTON 4X4 STRL (CAST SUPPLIES) ×2
PADDING CAST COTTON 6X4 STRL (CAST SUPPLIES) ×3 IMPLANT
PIN GUIDE DRILL TIP 2.8X300 (DRILL) ×9 IMPLANT
POSITIONER SURGICAL ARM (MISCELLANEOUS) ×3 IMPLANT
SCREW 16MM THREAD 6.5X85MM (Screw) ×9 IMPLANT
STRIP CLOSURE SKIN 1/2X4 (GAUZE/BANDAGES/DRESSINGS) ×2 IMPLANT
SUT MNCRL AB 3-0 PS2 18 (SUTURE) IMPLANT
SUT MNCRL AB 4-0 PS2 18 (SUTURE) ×3 IMPLANT
SUT VIC AB 1 CT1 36 (SUTURE) IMPLANT
SUT VIC AB 2-0 CT1 27 (SUTURE) ×4
SUT VIC AB 2-0 CT1 TAPERPNT 27 (SUTURE) ×2 IMPLANT
TRAY FOLEY W/METER SILVER 14FR (SET/KITS/TRAYS/PACK) IMPLANT
TRAY FOLEY W/METER SILVER 16FR (SET/KITS/TRAYS/PACK) IMPLANT

## 2016-04-26 NOTE — Anesthesia Procedure Notes (Signed)
Procedure Name: Intubation Date/Time: 04/26/2016 1:15 PM Performed by: Danley Danker L Patient Re-evaluated:Patient Re-evaluated prior to inductionOxygen Delivery Method: Circle system utilized Preoxygenation: Pre-oxygenation with 100% oxygen Intubation Type: IV induction Ventilation: Mask ventilation without difficulty Laryngoscope Size: Miller and 2 Grade View: Grade I Tube type: Oral Tube size: 7.0 mm Number of attempts: 1 Airway Equipment and Method: Stylet Placement Confirmation: ETT inserted through vocal cords under direct vision,  breath sounds checked- equal and bilateral and positive ETCO2 Secured at: 21 cm Tube secured with: Tape Dental Injury: Teeth and Oropharynx as per pre-operative assessment

## 2016-04-26 NOTE — H&P (Addendum)
History and Physical    Melissa Fischer [id number] DOB: April 05, 1934 DOA: 04/25/2016  Referring MD/NP/PA: Dr.Kohut. PCP: Mathews Argyle, MD  Outpatient Specialists: Dr.Klien. Cardiologist. Patient coming from: Home.  Chief Complaint: Fall with right hip pain.  HPI: Melissa Fischer is a 82 y.o. female with medical history significant of with history of pacemaker placement for syncope/hypotension, diabetes mellitus and COPD was brought to the ER after patient had a fall as witnessed by patient's daughter. Patient was trying to get out of the car after the church service when suddenly patient fell and hit her head. In the ER CT head did not show anything acute. X-rays and CAT scan of the hip reveal right hip fracture. Dr. Tamera Punt on-call orthopedic surgeon has been consulted and patient has been admitted for further management. Patient denies any chest pain shortness of breath palpitations or loss of consciousness.   ED Course: As per history of present illness.  Review of Systems: As per HPI otherwise 10 point review of systems negative.    Past Medical History  Diagnosis Date  . Diabetes ([place])   . Depression   . Memory loss   . Bronchitis   . Hypercholesteremia   . Peripheral neuropathy ([place])   . COPD (chronic obstructive pulmonary disease) ([place])   . Renal disease   . Shoulder pain, left   . Alzheimer disease   . Syncope   . Pacemaker     [employer]tr[employer] DOI 2011    Past Surgical History  Procedure Laterality Date  . No surgical history    . Pacemaker placement       reports that she quit smoking about 41 years ago. She does not have any smokeless tobacco history on file. She reports that she drinks alcohol. She reports that she does not use illicit drugs.  Allergies  Allergen Reactions  . Aricept [Donepezil]     Leg cramps  . Namenda [Memantine]     dizziness  . Penicillins     Family History  Problem Relation Age of Onset  . Diabetes Mother   . Dementia Neg Hx       Prior to Admission medications   Medication Sig Start Date End Date Taking? Authorizing Provider  fexofenadine (ALLEGRA) 180 MG tablet Take 180 mg by mouth daily.   Yes Historical Provider, MD  ibuprofen (ADVIL) 200 MG tablet Take 200-400 mg by mouth daily as needed. pain   Yes Historical Provider, MD  metFORMIN (GLUCOPHAGE-XR) 500 MG 24 hr tablet Take 500 mg by mouth daily. 01/23/16  Yes Historical Provider, MD  mometasone-formoterol (DULERA) 200-5 MCG/ACT AERO Inhale 2 puffs into the lungs 2 (two) times daily.   Yes Historical Provider, MD    Physical Exam: Filed Vitals:   04/26/16 0130 04/26/16 0215 04/26/16 0245 04/26/16 0305  BP:    128/64  Pulse: 96 105 101 97  Temp:      TempSrc:      Resp: 15 14 13 18   SpO2: 97% 98% 98% 100%      Constitutional: NAD, calm, comfortable Filed Vitals:   04/26/16 0130 04/26/16 0215 04/26/16 0245 04/26/16 0305  BP:    128/64  Pulse: 96 105 101 97  Temp:      TempSrc:      Resp: 15 14 13 18   SpO2: 97% 98% 98% 100%   Eyes: PERRL, lids and conjunctivae normal ENMT: Mucous membranes are moist. Posterior pharynx clear of any exudate or lesions.Normal dentition.  Neck: normal, supple, no masses,  no thyromegaly Respiratory: clear to auscultation bilaterally, no wheezing, no crackles. Normal respiratory effort. No accessory muscle use.  Cardiovascular: Regular rate and rhythm, no murmurs / rubs / gallops. No extremity edema. 2+ pedal pulses. No carotid bruits.  Abdomen: no tenderness, no masses palpated. No hepatosplenomegaly. Bowel sounds positive.  Musculoskeletal: no clubbing / cyanosis. No joint deformity upper and lower extremities. Good ROM, no contractures. Normal muscle tone.  Skin: no rashes, lesions, ulcers. No induration Neurologic: CN 2-12 grossly intact. Sensation intact, DTR normal. Strength 5/5 in all 4.  Psychiatric: Normal judgment and insight. Alert and oriented x 3. Normal mood.    Labs on Admission: I have personally  reviewed following labs and imaging studies  CBC:  Recent Labs Lab 04/26/16 0008  WBC 9.5  NEUTROABS 5.8  HGB 14.1  HCT 40.8  MCV 89.5  PLT 99991111   Basic Metabolic Panel:  Recent Labs Lab 04/26/16 0008  NA 134*  K 3.8  CL 100*  CO2 21*  GLUCOSE 217*  BUN 14  CREATININE 0.79  CALCIUM 9.7   GFR: CrCl cannot be calculated (Unknown ideal weight.). Liver Function Tests: No results for input(s): AST, ALT, ALKPHOS, BILITOT, PROT, ALBUMIN in the last 168 hours. No results for input(s): LIPASE, AMYLASE in the last 168 hours. No results for input(s): AMMONIA in the last 168 hours. Coagulation Profile: No results for input(s): INR, PROTIME in the last 168 hours. Cardiac Enzymes: No results for input(s): CKTOTAL, CKMB, CKMBINDEX, TROPONINI in the last 168 hours. BNP (last 3 results) No results for input(s): PROBNP in the last 8760 hours. HbA1C: No results for input(s): HGBA1C in the last 72 hours. CBG: No results for input(s): GLUCAP in the last 168 hours. Lipid Profile: No results for input(s): CHOL, HDL, LDLCALC, TRIG, CHOLHDL, LDLDIRECT in the last 72 hours. Thyroid Function Tests: No results for input(s): TSH, T4TOTAL, FREET4, T3FREE, THYROIDAB in the last 72 hours. Anemia Panel: No results for input(s): VITAMINB12, FOLATE, FERRITIN, TIBC, IRON, RETICCTPCT in the last 72 hours. Urine analysis: No results found for: COLORURINE, APPEARANCEUR, LABSPEC, PHURINE, GLUCOSEU, HGBUR, BILIRUBINUR, KETONESUR, PROTEINUR, UROBILINOGEN, NITRITE, LEUKOCYTESUR Sepsis Labs: @LABRCNTIP (procalcitonin:4,lacticidven:4) )No results found for this or any previous visit (from the past 240 hour(s)).   Radiological Exams on Admission: Dg Chest 1 View  04/26/2016  CLINICAL DATA:  Preop exam for hip fracture EXAM: CHEST 1 VIEW COMPARISON:  None. FINDINGS: Normal heart size and mediastinal contours. Dual-chamber pacer from the left. No acute infiltrate or edema. No effusion or pneumothorax. No  acute osseous findings. IMPRESSION: No evidence of active disease or thoracic injury. Electronically Signed   By: Monte Fantasia M.D.   On: 04/26/2016 00:55   Ct Head Wo Contrast  04/25/2016  CLINICAL DATA:  82 year old female with history of trauma from a fall while getting out of a car earlier this evening. Hematoma on the right side of the forehead. EXAM: CT HEAD WITHOUT CONTRAST TECHNIQUE: Contiguous axial images were obtained from the base of the skull through the vertex without intravenous contrast. COMPARISON:  No priors. FINDINGS: Soft tissue swelling in the right frontal scalp laterally where there is some high attenuation, compatible with a small scalp hematoma. Mild cerebral atrophy. Patchy and confluent areas of decreased attenuation are noted throughout the deep and periventricular white matter of the cerebral hemispheres bilaterally, compatible with chronic microvascular ischemic disease. No acute displaced skull fractures are identified. No acute intracranial abnormality. Specifically, no evidence of acute post-traumatic intracranial hemorrhage, no definite regions of acute/subacute  cerebral ischemia, no focal mass, mass effect, hydrocephalus or abnormal intra or extra-axial fluid collections. The visualized paranasal sinuses and mastoids are well pneumatized. IMPRESSION: 1. Small right lateral frontal scalp hematoma. 2. No acute displaced skull fractures or evidence of significant acute traumatic injury to the brain. 3. Mild cerebral atrophy with extensive chronic microvascular ischemic changes in the cerebral white matter. Electronically Signed   By: Vinnie Langton M.D.   On: 04/25/2016 23:55   Ct Pelvis Wo Contrast  04/26/2016  CLINICAL DATA:  Fall with right hip and pelvic pain. Initial encounter. EXAM: CT PELVIS WITHOUT CONTRAST TECHNIQUE: Multidetector CT imaging of the pelvis was performed following the standard protocol without intravenous contrast. Dedicated reformats of the pelvis  and right hip were obtained. COMPARISON:  Radiography from late yesterday FINDINGS: Re- demonstrated subcapital right femoral neck fracture with impaction laterally. No involvement along the articular surface of the femoral head. There is no pelvic fracture. Degenerative spurring to the right acetabulum is mild. No hip joint narrowing. No myotendinous disruption. Osteopenia. No posttraumatic finding to the visceral pelvis. Lower lumbar disc and facet degeneration. IMPRESSION: Subcapital right femoral neck fracture with lateral impaction. Electronically Signed   By: Monte Fantasia M.D.   On: 04/26/2016 00:53   Ct Hip Right Wo Contrast  04/26/2016  CLINICAL DATA:  Fall with right hip and pelvic pain. Initial encounter. EXAM: CT PELVIS WITHOUT CONTRAST TECHNIQUE: Multidetector CT imaging of the pelvis was performed following the standard protocol without intravenous contrast. Dedicated reformats of the pelvis and right hip were obtained. COMPARISON:  Radiography from late yesterday FINDINGS: Re- demonstrated subcapital right femoral neck fracture with impaction laterally. No involvement along the articular surface of the femoral head. There is no pelvic fracture. Degenerative spurring to the right acetabulum is mild. No hip joint narrowing. No myotendinous disruption. Osteopenia. No posttraumatic finding to the visceral pelvis. Lower lumbar disc and facet degeneration. IMPRESSION: Subcapital right femoral neck fracture with lateral impaction. Electronically Signed   By: Monte Fantasia M.D.   On: 04/26/2016 00:53   Dg Hip Unilat With Pelvis 2-3 Views Right  04/25/2016  CLINICAL DATA:  Fall with hip pain on the right.  Initial encounter. EXAM: DG HIP (WITH OR WITHOUT PELVIS) 2-3V RIGHT COMPARISON:  None. FINDINGS: Right femoral neck fracture with lateral impaction. No notable degenerative changes to the acetabula. No evidence of pelvic ring fracture or diastasis. IMPRESSION: Mildly impacted subcapital right  femoral neck fracture. Electronically Signed   By: Monte Fantasia M.D.   On: 04/25/2016 23:44    EKG: Independently reviewed. Normal sinus rhythm.  Assessment/Plan Principal Problem:   Hip fracture (HCC) Active Problems:   Type 2 diabetes mellitus with hyperglycemia ([place])    #1. Right hip fracture status post mechanical fall - from medical standpoint of view patient is at moderate risk for intermediate risk procedure. Patient will be kept nothing by mouth in anticipation of hip surgery. Continue with pain relief medications. Physical therapy consult. #2. Diabetes mellitus type 2 - while inpatient patient will be on sliding scale coverage. Closely follow CBGs patient may need long-acting insulin. #3. History of COPD presently not wheezing continue inhalers. #4. History of pacemaker placement for syncope and hypotension. #5. History of dementia per the chart. Not on any medication at this time.  Since patient drinks whiskey every night I have placed patient on CIWA protocol.   DVT prophylaxis: SCDs. Code Status: Full code.  Family Communication: Patient's daughter.  Disposition Plan: We need to be  discharged to rehabilitation.  Consults called: Dr. Tamera Punt on-call orthopedic surgeon consulted by ER physician.  Admission status: Inpatient. MedSurg floor.    Rise Patience MD [facility] Pager [phone].  If 7PM-7AM, please contact night-coverage [email or web address] Password [email or web address]  04/26/2016, 4:14 AM

## 2016-04-26 NOTE — Consult Note (Signed)
Reason for Consult:right hip pain Referring Physician: Tat,D  Mena Melissa Fischer is an 82 y.o. female.  HPI: 82 year old female with a history of diabetes mellitus, syncope requiring permanent pacemaker, COPD, Alzheimer's dementia, hyperlipidemia with right hip pain sustained after a fall getting out of her car. Could not weight bear on right leg. Presented to ED and X-ray and CT of the right hip revealed right subcapital femoral neck fracture. Now with continued right leg pain, worse with movement, mild discomfort at rest.  At baseline, the patient has full ambulatory function without use of walker or cane. She denies hx of lower extremity surgery or problems.   Past Medical History  Diagnosis Date  . Diabetes ([place])   . Depression   . Memory loss   . Bronchitis   . Hypercholesteremia   . Peripheral neuropathy ([place])   . COPD (chronic obstructive pulmonary disease) ([place])   . Renal disease   . Shoulder pain, left   . Alzheimer disease   . Syncope   . Pacemaker     Medtronic DOI 2011    Past Surgical History  Procedure Laterality Date  . No surgical history    . Pacemaker placement      Family History  Problem Relation Age of Onset  . Diabetes Mother   . Dementia Neg Hx     Social History:  reports that she quit smoking about 41 years ago. She does not have any smokeless tobacco history on file. She reports that she drinks alcohol. She reports that she does not use illicit drugs.  Allergies:  Allergies  Allergen Reactions  . Aricept [Donepezil]     Leg cramps  . Namenda [Memantine]     dizziness  . Penicillins     "pt does not know its been so long" Has patient had a PCN reaction causing immediate rash, facial/tongue/throat swelling, SOB or lightheadedness with hypotension: No Has patient had a PCN reaction causing severe rash involving mucus membranes or skin necrosis: No Has patient had a PCN reaction that required hospitalization No Has patient had a PCN reaction  occurring within the last 10 years: No If all of the above answers are "NO", then ma  . Statins Other (See Comments)    unknown  . Sulfa Antibiotics     Medications: I have reviewed the patient's current medications.  Results for orders placed or performed during the hospital encounter of 04/25/16 (from the past 48 hour(s))  CBC with Differential/Platelet     Status: None   Collection Time: 04/26/16 12:08 AM  Result Value Ref Range   WBC 9.5 4.0 - 10.5 K/uL   RBC 4.56 3.87 - 5.11 MIL/uL   Hemoglobin 14.1 12.0 - 15.0 g/dL   HCT 40.8 36.0 - 46.0 %   MCV 89.5 78.0 - 100.0 fL   MCH 30.9 26.0 - 34.0 pg   MCHC 34.6 30.0 - 36.0 g/dL   RDW 12.7 11.5 - 15.5 %   Platelets 218 150 - 400 K/uL   Neutrophils Relative % 62 %   Neutro Abs 5.8 1.7 - 7.7 K/uL   Lymphocytes Relative 28 %   Lymphs Abs 2.6 0.7 - 4.0 K/uL   Monocytes Relative 9 %   Monocytes Absolute 0.9 0.1 - 1.0 K/uL   Eosinophils Relative 1 %   Eosinophils Absolute 0.1 0.0 - 0.7 K/uL   Basophils Relative 0 %   Basophils Absolute 0.0 0.0 - 0.1 K/uL  Basic metabolic panel     Status: Abnormal  Collection Time: 04/26/16 12:08 AM  Result Value Ref Range   Sodium 134 (L) 135 - 145 mmol/L   Potassium 3.8 3.5 - 5.1 mmol/L   Chloride 100 (L) 101 - 111 mmol/L   CO2 21 (L) 22 - 32 mmol/L   Glucose, Bld 217 (H) 65 - 99 mg/dL   BUN 14 6 - 20 mg/dL   Creatinine, Ser 5.04 0.44 - 1.00 mg/dL   Calcium 9.7 8.9 - 03.0 mg/dL   GFR calc non Af Amer >60 >60 mL/min   GFR calc Af Amer >60 >60 mL/min    Comment: (NOTE) The eGFR has been calculated using the CKD EPI equation. This calculation has not been validated in all clinical situations. eGFR's persistently <60 mL/min signify possible Chronic Kidney Disease.    Anion gap 13 5 - 15  CBC WITH DIFFERENTIAL     Status: None   Collection Time: 04/26/16  5:06 AM  Result Value Ref Range   WBC 10.4 4.0 - 10.5 K/uL   RBC 4.35 3.87 - 5.11 MIL/uL   Hemoglobin 13.4 12.0 - 15.0 g/dL   HCT  60.6 71.5 - 19.5 %   MCV 89.2 78.0 - 100.0 fL   MCH 30.8 26.0 - 34.0 pg   MCHC 34.5 30.0 - 36.0 g/dL   RDW 11.1 35.6 - 52.7 %   Platelets 193 150 - 400 K/uL   Neutrophils Relative % 74 %   Neutro Abs 7.6 1.7 - 7.7 K/uL   Lymphocytes Relative 18 %   Lymphs Abs 1.9 0.7 - 4.0 K/uL   Monocytes Relative 7 %   Monocytes Absolute 0.7 0.1 - 1.0 K/uL   Eosinophils Relative 1 %   Eosinophils Absolute 0.1 0.0 - 0.7 K/uL   Basophils Relative 0 %   Basophils Absolute 0.0 0.0 - 0.1 K/uL  Comprehensive metabolic panel     Status: Abnormal   Collection Time: 04/26/16  5:06 AM  Result Value Ref Range   Sodium 135 135 - 145 mmol/L   Potassium 4.2 3.5 - 5.1 mmol/L   Chloride 103 101 - 111 mmol/L   CO2 21 (L) 22 - 32 mmol/L   Glucose, Bld 191 (H) 65 - 99 mg/dL   BUN 13 6 - 20 mg/dL   Creatinine, Ser 8.02 0.44 - 1.00 mg/dL   Calcium 9.1 8.9 - 44.3 mg/dL   Total Protein 6.4 (L) 6.5 - 8.1 g/dL   Albumin 4.0 3.5 - 5.0 g/dL   AST 23 15 - 41 U/L   ALT 19 14 - 54 U/L   Alkaline Phosphatase 58 38 - 126 U/L   Total Bilirubin 0.9 0.3 - 1.2 mg/dL   GFR calc non Af Amer >60 >60 mL/min   GFR calc Af Amer >60 >60 mL/min    Comment: (NOTE) The eGFR has been calculated using the CKD EPI equation. This calculation has not been validated in all clinical situations. eGFR's persistently <60 mL/min signify possible Chronic Kidney Disease.    Anion gap 11 5 - 15  Glucose, capillary     Status: Abnormal   Collection Time: 04/26/16  7:41 AM  Result Value Ref Range   Glucose-Capillary 197 (H) 65 - 99 mg/dL    Dg Chest 1 View  2/98/5110  CLINICAL DATA:  Preop exam for hip fracture EXAM: CHEST 1 VIEW COMPARISON:  None. FINDINGS: Normal heart size and mediastinal contours. Dual-chamber pacer from the left. No acute infiltrate or edema. No effusion or pneumothorax. No acute osseous findings.  IMPRESSION: No evidence of active disease or thoracic injury. Electronically Signed   By: Monte Fantasia M.D.   On:  04/26/2016 00:55   Ct Head Wo Contrast  04/25/2016  CLINICAL DATA:  82 year old female with history of trauma from a fall while getting out of a car earlier this evening. Hematoma on the right side of the forehead. EXAM: CT HEAD WITHOUT CONTRAST TECHNIQUE: Contiguous axial images were obtained from the base of the skull through the vertex without intravenous contrast. COMPARISON:  No priors. FINDINGS: Soft tissue swelling in the right frontal scalp laterally where there is some high attenuation, compatible with a small scalp hematoma. Mild cerebral atrophy. Patchy and confluent areas of decreased attenuation are noted throughout the deep and periventricular white matter of the cerebral hemispheres bilaterally, compatible with chronic microvascular ischemic disease. No acute displaced skull fractures are identified. No acute intracranial abnormality. Specifically, no evidence of acute post-traumatic intracranial hemorrhage, no definite regions of acute/subacute cerebral ischemia, no focal mass, mass effect, hydrocephalus or abnormal intra or extra-axial fluid collections. The visualized paranasal sinuses and mastoids are well pneumatized. IMPRESSION: 1. Small right lateral frontal scalp hematoma. 2. No acute displaced skull fractures or evidence of significant acute traumatic injury to the brain. 3. Mild cerebral atrophy with extensive chronic microvascular ischemic changes in the cerebral white matter. Electronically Signed   By: Vinnie Langton M.D.   On: 04/25/2016 23:55   Ct Pelvis Wo Contrast  04/26/2016  CLINICAL DATA:  Fall with right hip and pelvic pain. Initial encounter. EXAM: CT PELVIS WITHOUT CONTRAST TECHNIQUE: Multidetector CT imaging of the pelvis was performed following the standard protocol without intravenous contrast. Dedicated reformats of the pelvis and right hip were obtained. COMPARISON:  Radiography from late yesterday FINDINGS: Re- demonstrated subcapital right femoral neck fracture  with impaction laterally. No involvement along the articular surface of the femoral head. There is no pelvic fracture. Degenerative spurring to the right acetabulum is mild. No hip joint narrowing. No myotendinous disruption. Osteopenia. No posttraumatic finding to the visceral pelvis. Lower lumbar disc and facet degeneration. IMPRESSION: Subcapital right femoral neck fracture with lateral impaction. Electronically Signed   By: Monte Fantasia M.D.   On: 04/26/2016 00:53   Ct Hip Right Wo Contrast  04/26/2016  CLINICAL DATA:  Fall with right hip and pelvic pain. Initial encounter. EXAM: CT PELVIS WITHOUT CONTRAST TECHNIQUE: Multidetector CT imaging of the pelvis was performed following the standard protocol without intravenous contrast. Dedicated reformats of the pelvis and right hip were obtained. COMPARISON:  Radiography from late yesterday FINDINGS: Re- demonstrated subcapital right femoral neck fracture with impaction laterally. No involvement along the articular surface of the femoral head. There is no pelvic fracture. Degenerative spurring to the right acetabulum is mild. No hip joint narrowing. No myotendinous disruption. Osteopenia. No posttraumatic finding to the visceral pelvis. Lower lumbar disc and facet degeneration. IMPRESSION: Subcapital right femoral neck fracture with lateral impaction. Electronically Signed   By: Monte Fantasia M.D.   On: 04/26/2016 00:53   Dg Hip Unilat With Pelvis 2-3 Views Right  04/25/2016  CLINICAL DATA:  Fall with hip pain on the right.  Initial encounter. EXAM: DG HIP (WITH OR WITHOUT PELVIS) 2-3V RIGHT COMPARISON:  None. FINDINGS: Right femoral neck fracture with lateral impaction. No notable degenerative changes to the acetabula. No evidence of pelvic ring fracture or diastasis. IMPRESSION: Mildly impacted subcapital right femoral neck fracture. Electronically Signed   By: Monte Fantasia M.D.   On: 04/25/2016 23:44  Review of Systems  Musculoskeletal:        Right hip pain.  All other systems reviewed and are negative.  Blood pressure 126/60, pulse 78, temperature 99 F (37.2 C), temperature source Oral, resp. rate 18, height 5' 8.5" (1.74 m), weight 69.8 kg (153 lb 14.1 oz), SpO2 100 %. Physical Exam  Constitutional: She is oriented to person, place, and time. She appears well-developed and well-nourished.  HENT:  Head: Normocephalic and atraumatic.  Eyes: EOM are normal.  Neck: Normal range of motion.  Cardiovascular: Intact distal pulses.   Respiratory: Effort normal.  Musculoskeletal:  Right hip skin intact Right hip TTP ROM limited by pain Right LE shortened and ext rotated Wiggles toes, distally NVI  Neurological: She is alert and oriented to person, place, and time.  Skin: Skin is warm and dry.  Psychiatric: She has a normal mood and affect. Her behavior is normal. Judgment and thought content normal.    Assessment/Plan: Right mildly impacted femoral neck fracture after mechanical fall on 04/25/16 Plan for percutaneous screw fixation today to allow for early weightbearing and return to ambulation, surgery and postoperative course discussed with patient and daughter and they are agreeable  NPO Hold anticoagulation NWB R LE  Continue current pain rx.   Grier Mitts 04/26/2016, 8:49 AM

## 2016-04-26 NOTE — Anesthesia Postprocedure Evaluation (Signed)
Anesthesia Post Note  Patient: Melissa Fischer  Procedure(s) Performed: Procedure(s) (LRB): CANNULATED HIP PINNING (Right)  Patient location during evaluation: PACU Anesthesia Type: General Level of consciousness: awake and alert Pain management: pain level controlled Vital Signs Assessment: post-procedure vital signs reviewed and stable Respiratory status: spontaneous breathing, nonlabored ventilation, respiratory function stable and patient connected to nasal cannula oxygen Cardiovascular status: blood pressure returned to baseline and stable Postop Assessment: no signs of nausea or vomiting Anesthetic complications: no    Last Vitals:  Filed Vitals:   04/26/16 1715 04/26/16 1816  BP: 106/90 120/43  Pulse: 71 72  Temp: 36.9 C 37.2 C  Resp: 14 16    Last Pain:  Filed Vitals:   04/26/16 1852  PainSc: Tyler Deis

## 2016-04-26 NOTE — Op Note (Signed)
04/25/2016 - 04/26/2016  1:52 PM  PATIENT:  Melissa Fischer    PRE-OPERATIVE DIAGNOSIS:  Right impacted femoral neck fractureFracture  POST-OPERATIVE DIAGNOSIS:  Same  PROCEDURE:  CANNULATED HIP PINNING  SURGEON:  [name], MD  PHYSICIAN ASSISTANT: none  ANESTHESIA:   General  PREOPERATIVE INDICATIONS:  Melissa Fischer is a  82 y.o. female who fell and was found to have a diagnosis of Right Hip Fracture who elected for surgical management.    The risks benefits and alternatives were discussed with the patient preoperatively including but not limited to the risks of infection, bleeding, nerve injury, cardiopulmonary complications, blood clots, malunion, nonunion, avascular necrosis, the need for revision surgery, the potential for conversion to hemiarthroplasty, among others, and the patient was willing to proceed.  OPERATIVE IMPLANTS: 6.5 mm cannulated titanium screws x3  OPERATIVE FINDINGS: Clinical osteoporosis with weak bone, proximal femur  OPERATIVE PROCEDURE: The patient was brought to the operating room and placed in supine position. IV antibiotics were given. General anesthesia administered. Foley was also given. She was placed on the fracture table. The right lower extremity was positioned, without any significant reduction maneuver. The right lower extremity was prepped and draped in usual sterile fashion.  Time out was performed.  Small incision was made distal to the greater trochanter, and 3 guidewires were introduced Into an inverted triangle configuration. The lengths were measured. The reduction was slightly valgus, and near-anatomic. The screws were then placed.  Satisfactory fixation was achieved.  The wounds were irrigated copiously, and repaired with Vicryl with staples and sterile gauze. There no complications and the patient tolerated the procedure well.  The patient will be weightbearing as tolerated, and will be on ECASA BID for a period of one month  postoperatively.

## 2016-04-26 NOTE — Progress Notes (Signed)
PROGRESS NOTE  Melissa Fischer [id number] DOB: [birth year] DOA: 04/25/2016 PCP: Mathews Argyle, MD Outpatient Specialists:  [facility]  Brief History:  82 year old female with a history of diabetes mellitus, syncope requiring permanent pacemaker, COPD, Alzheimer's dementia, hyperlipidemia  presented after a mechanical fall getting out of the car. The patient and her head and complained of right hip pain. She presented to the emergency department where x-ray and CT of the right hip revealed right subcapital femoral neck fracture. Orthopedics, Dr. Tamera Punt, was consulted, and surgery is planned on 04/26/2016. The patient denied any chest discomfort, shortness of breath, dizziness, syncopal. From a functional standpoint, the patient is normally able to perform her ADLs without any difficulty. She is able to walk around the block without chest discomfort or shortness of breath. There is no prior history of stroke or MI.  Assessment/Plan: Right subcapital femoral neck fracture -ortho consulted, surgery planned 04/26/16 -pt is a moderate risk for surgery, but no interventions will change her risk presently -control pain -PT/OT after surgery  Diabetes mellitus type 2 -Hemoglobin A1c -Discontinue metformin while inpatient -NovoLog sliding scale  History of syncope, status post PPM -follows Dr. Olin Pia -PPM last interrogated prior to admission on 01/30/2016  COPD -Stable presently -Resume Dulera post-op  Alzheimer's dementia -Follows Neurology, Dr. Jaynee Eagles -could not tolerate Namenda, Aricept, Exelon in past     Disposition Plan:   Home in 2 days  Family Communication:   Daughter updated at beside 04/26/16  Consultants:  Artemio Aly  Code Status:  FULL    Subjective: Patient denies fevers, chills, headache, chest pain, dyspnea, nausea, vomiting, diarrhea, abdominal pain, dysuria, hematuria   Objective: Filed Vitals:   04/26/16 0215  04/26/16 0245 04/26/16 0305 04/26/16 0449  BP:   128/64 126/60  Pulse: 105 101 97 78  Temp:    99 F (37.2 C)  TempSrc:    Oral  Resp: 14 13 18 18   Height:    5' 8.5" (1.74 m)  Weight:    69.8 kg (153 lb 14.1 oz)  SpO2: 98% 98% 100% 100%    Intake/Output Summary (Last 24 hours) at 04/26/16 0716 Last data filed at 04/26/16 0600  Gross per 24 hour  Intake  71.25 ml  Output      0 ml  Net  71.25 ml   Weight change:  Exam:   General:  Pt is alert, follows commands appropriately, not in acute distress  HEENT: No icterus, No thrush, No neck mass, [place]/AT  Cardiovascular: RRR, S1/S2, no rubs, no gallops  Respiratory: CTA bilaterally, no wheezing, no crackles, no rhonchi  Abdomen: Soft/+BS, non tender, non distended, no guarding  Extremities: No edema, No lymphangitis, No petechiae, No rashes, no synovitis   Data Reviewed: I have personally reviewed following labs and imaging studies Basic Metabolic Panel:  Recent Labs Lab 04/26/16 0008 04/26/16 0506  NA 134* 135  K 3.8 4.2  CL 100* 103  CO2 21* 21*  GLUCOSE 217* 191*  BUN 14 13  CREATININE 0.79 0.74  CALCIUM 9.7 9.1   Liver Function Tests:  Recent Labs Lab 04/26/16 0506  AST 23  ALT 19  ALKPHOS 58  BILITOT 0.9  PROT 6.4*  ALBUMIN 4.0   No results for input(s): LIPASE, AMYLASE in the last 168 hours. No results for input(s): AMMONIA in the last 168 hours. Coagulation Profile: No results for input(s): INR, PROTIME in the last 168 hours. CBC:  Recent  Labs Lab 04/26/16 0008 04/26/16 0506  WBC 9.5 10.4  NEUTROABS 5.8 7.6  HGB 14.1 13.4  HCT 40.8 38.8  MCV 89.5 89.2  PLT 218 193   Cardiac Enzymes: No results for input(s): CKTOTAL, CKMB, CKMBINDEX, TROPONINI in the last 168 hours. BNP: Invalid input(s): POCBNP CBG: No results for input(s): GLUCAP in the last 168 hours. HbA1C: No results for input(s): HGBA1C in the last 72 hours. Urine analysis: No results found for: COLORURINE, APPEARANCEUR,  LABSPEC, PHURINE, GLUCOSEU, HGBUR, BILIRUBINUR, KETONESUR, PROTEINUR, UROBILINOGEN, NITRITE, LEUKOCYTESUR Sepsis Labs: @LABRCNTIP (procalcitonin:4,lacticidven:4) )No results found for this or any previous visit (from the past 240 hour(s)).   Scheduled Meds: . chlorhexidine  60 mL Topical Once  . clindamycin (CLEOCIN) IV  900 mg Intravenous On Call to OR  . insulin aspart  0-9 Units Subcutaneous TID WC  . mometasone-formoterol  2 puff Inhalation BID  . povidone-iodine  2 application Topical Once   Continuous Infusions: . sodium chloride 75 mL/hr at 04/26/16 0503    Procedures/Studies: Dg Chest 1 View  04/26/2016  CLINICAL DATA:  Preop exam for hip fracture EXAM: CHEST 1 VIEW COMPARISON:  None. FINDINGS: Normal heart size and mediastinal contours. Dual-chamber pacer from the left. No acute infiltrate or edema. No effusion or pneumothorax. No acute osseous findings. IMPRESSION: No evidence of active disease or thoracic injury. Electronically Signed   By: Monte Fantasia M.D.   On: 04/26/2016 00:55   Ct Head Wo Contrast  04/25/2016  CLINICAL DATA:  82 year old female with history of trauma from a fall while getting out of a car earlier this evening. Hematoma on the right side of the forehead. EXAM: CT HEAD WITHOUT CONTRAST TECHNIQUE: Contiguous axial images were obtained from the base of the skull through the vertex without intravenous contrast. COMPARISON:  No priors. FINDINGS: Soft tissue swelling in the right frontal scalp laterally where there is some high attenuation, compatible with a small scalp hematoma. Mild cerebral atrophy. Patchy and confluent areas of decreased attenuation are noted throughout the deep and periventricular white matter of the cerebral hemispheres bilaterally, compatible with chronic microvascular ischemic disease. No acute displaced skull fractures are identified. No acute intracranial abnormality. Specifically, no evidence of acute post-traumatic intracranial hemorrhage,  no definite regions of acute/subacute cerebral ischemia, no focal mass, mass effect, hydrocephalus or abnormal intra or extra-axial fluid collections. The visualized paranasal sinuses and mastoids are well pneumatized. IMPRESSION: 1. Small right lateral frontal scalp hematoma. 2. No acute displaced skull fractures or evidence of significant acute traumatic injury to the brain. 3. Mild cerebral atrophy with extensive chronic microvascular ischemic changes in the cerebral white matter. Electronically Signed   By: Vinnie Langton M.D.   On: 04/25/2016 23:55   Ct Pelvis Wo Contrast  04/26/2016  CLINICAL DATA:  Fall with right hip and pelvic pain. Initial encounter. EXAM: CT PELVIS WITHOUT CONTRAST TECHNIQUE: Multidetector CT imaging of the pelvis was performed following the standard protocol without intravenous contrast. Dedicated reformats of the pelvis and right hip were obtained. COMPARISON:  Radiography from late yesterday FINDINGS: Re- demonstrated subcapital right femoral neck fracture with impaction laterally. No involvement along the articular surface of the femoral head. There is no pelvic fracture. Degenerative spurring to the right acetabulum is mild. No hip joint narrowing. No myotendinous disruption. Osteopenia. No posttraumatic finding to the visceral pelvis. Lower lumbar disc and facet degeneration. IMPRESSION: Subcapital right femoral neck fracture with lateral impaction. Electronically Signed   By: Monte Fantasia M.D.   On: 04/26/2016 00:53  Ct Hip Right Wo Contrast  04/26/2016  CLINICAL DATA:  Fall with right hip and pelvic pain. Initial encounter. EXAM: CT PELVIS WITHOUT CONTRAST TECHNIQUE: Multidetector CT imaging of the pelvis was performed following the standard protocol without intravenous contrast. Dedicated reformats of the pelvis and right hip were obtained. COMPARISON:  Radiography from late yesterday FINDINGS: Re- demonstrated subcapital right femoral neck fracture with impaction  laterally. No involvement along the articular surface of the femoral head. There is no pelvic fracture. Degenerative spurring to the right acetabulum is mild. No hip joint narrowing. No myotendinous disruption. Osteopenia. No posttraumatic finding to the visceral pelvis. Lower lumbar disc and facet degeneration. IMPRESSION: Subcapital right femoral neck fracture with lateral impaction. Electronically Signed   By: Monte Fantasia M.D.   On: 04/26/2016 00:53   Dg Hip Unilat With Pelvis 2-3 Views Right  04/25/2016  CLINICAL DATA:  Fall with hip pain on the right.  Initial encounter. EXAM: DG HIP (WITH OR WITHOUT PELVIS) 2-3V RIGHT COMPARISON:  None. FINDINGS: Right femoral neck fracture with lateral impaction. No notable degenerative changes to the acetabula. No evidence of pelvic ring fracture or diastasis. IMPRESSION: Mildly impacted subcapital right femoral neck fracture. Electronically Signed   By: Monte Fantasia M.D.   On: 04/25/2016 23:44    Sinda Leedom, DO  [facility] Pager [phone]  If 7PM-7AM, please contact night-coverage [email or web address] Password [email or web address] 04/26/2016, 7:16 AM   LOS: 0 days

## 2016-04-26 NOTE — Discharge Instructions (Signed)
Discharge Instructions after Hip Surgery ° ° °You can bear weight and ambulate as tolerated on both legs °Use ice on the hip intermittently over the first 48 hours after surgery.  °Pain medicine has been prescribed for you.  °Use your medicine liberally over the first 48 hours, and then you can begin to taper your use. You may take Extra Strength Tylenol or Tylenol only in place of the pain pills. DO NOT take ANY nonsteroidal anti-inflammatory pain medications: Advil, Motrin, Ibuprofen, Aleve, Naproxen or Naprosyn.  °Take aspirin or anticoagulant medication as prescribed for 2 weeks after surgery. Please notify if allergic or sensitivity to aspirin. °You may remove your dressing after two days.  °You may shower 5 days after surgery. The incisions CANNOT get wet prior to 5 days. Simply allow the water to wash over the site and then pat dry. Do not rub the incisions. ° ° ° °Please call [phone] during normal business hours or [phone] after hours for any problems. Including the following: ° °- excessive redness of the incisions °- drainage for more than 4 days °- fever of more than 101.5 F ° °*Please note that pain medications will not be refilled after hours or on weekends. ° °

## 2016-04-26 NOTE — Transfer of Care (Signed)
Immediate Anesthesia Transfer of Care Note  Patient: Melissa Fischer  Procedure(s) Performed: Procedure(s): CANNULATED HIP PINNING (Right)  Patient Location: PACU  Anesthesia Type:General  Level of Consciousness: sedated  Airway & Oxygen Therapy: Patient Spontanous Breathing and Patient connected to face mask oxygen  Post-op Assessment: Report given to RN and Post -op Vital signs reviewed and stable  Post vital signs: Reviewed and stable  Last Vitals:  Filed Vitals:   04/26/16 0449 04/26/16 1139  BP: 126/60 136/63  Pulse: 78 77  Temp: 37.2 C 36.8 C  Resp: 18 16    Last Pain:  Filed Vitals:   04/26/16 1140  PainSc: 5       Patients Stated Pain Goal: 3 ([id number])  Complications: No apparent anesthesia complications

## 2016-04-26 NOTE — Anesthesia Preprocedure Evaluation (Addendum)
Anesthesia Evaluation  Patient identified by MRN, date of birth, ID band Patient awake    Reviewed: Allergy & Precautions, NPO status , Patient's Chart, lab work & pertinent test results  Airway Mallampati: II  TM Distance: >3 FB Neck ROM: Full    Dental  (+) Dental Advisory Given   Pulmonary COPD, former smoker,    Pulmonary exam normal        Cardiovascular + pacemaker  Rhythm:Regular Rate:Normal + Systolic murmurs    Neuro/Psych Depression negative neurological ROS     GI/Hepatic negative GI ROS, Neg liver ROS,   Endo/Other  diabetes, Type 2, Oral Hypoglycemic Agents  Renal/GU negative Renal ROS     Musculoskeletal   Abdominal   Peds  Hematology negative hematology ROS (+)   Anesthesia Other Findings   Reproductive/Obstetrics                            Lab Results  Component Value Date   WBC 10.4 04/26/2016   HGB 13.4 04/26/2016   HCT 38.8 04/26/2016   MCV 89.2 04/26/2016   PLT 193 04/26/2016   Lab Results  Component Value Date   CREATININE 0.74 04/26/2016   BUN 13 04/26/2016   NA 135 04/26/2016   K 4.2 04/26/2016   CL 103 04/26/2016   CO2 21* 04/26/2016   No results found for: INR, PROTIME  Anesthesia Physical Anesthesia Plan  ASA: III  Anesthesia Plan: General   Post-op Pain Management:    Induction: Intravenous  Airway Management Planned: Oral ETT  Additional Equipment:   Intra-op Plan:   Post-operative Plan: Extubation in OR  Informed Consent: I have reviewed the patients History and Physical, chart, labs and discussed the procedure including the risks, benefits and alternatives for the proposed anesthesia with the patient or authorized representative who has indicated his/her understanding and acceptance.   Dental advisory given  Plan Discussed with: CRNA  Anesthesia Plan Comments:         Anesthesia Quick Evaluation

## 2016-04-27 LAB — GLUCOSE, CAPILLARY
GLUCOSE-CAPILLARY: 175 mg/dL — AB (ref 65–99)
Glucose-Capillary: 137 mg/dL — ABNORMAL HIGH (ref 65–99)
Glucose-Capillary: 131 mg/dL — ABNORMAL HIGH (ref 65–99)
Glucose-Capillary: 248 mg/dL — ABNORMAL HIGH (ref 65–99)

## 2016-04-27 LAB — CBC
HEMATOCRIT: 37.2 % (ref 36.0–46.0)
Hemoglobin: 12.8 g/dL (ref 12.0–15.0)
MCH: 31.1 pg (ref 26.0–34.0)
MCHC: 34.4 g/dL (ref 30.0–36.0)
MCV: 90.3 fL (ref 78.0–100.0)
Platelets: 157 10*3/uL (ref 150–400)
RBC: 4.12 MIL/uL (ref 3.87–5.11)
RDW: 12.8 % (ref 11.5–15.5)
WBC: 8.9 10*3/uL (ref 4.0–10.5)

## 2016-04-27 LAB — BASIC METABOLIC PANEL
ANION GAP: 9 (ref 5–15)
BUN: 10 mg/dL (ref 6–20)
CO2: 23 mmol/L (ref 22–32)
CREATININE: 0.77 mg/dL (ref 0.44–1.00)
Calcium: 8.7 mg/dL — ABNORMAL LOW (ref 8.9–10.3)
Chloride: 101 mmol/L (ref 101–111)
GFR calc Af Amer: >60 mL/min (ref >60)
Glucose, Bld: 183 mg/dL — ABNORMAL HIGH (ref 65–99)
Potassium: 4.3 mmol/L (ref 3.5–5.1)
Sodium: 133 mmol/L — ABNORMAL LOW (ref 135–145)

## 2016-04-27 MED ORDER — GLUCERNA SHAKE PO LIQD
237.0000 mL | Freq: Three times a day (TID) | ORAL | Status: DC
  Administered 2016-04-27 – 2016-04-29 (×6): 237 mL via ORAL
  Filled 2016-04-27 (×7): qty 237

## 2016-04-27 MED ORDER — GLUCERNA SHAKE PO LIQD: 237.0000 mL | Freq: Three times a day (TID) | ORAL | Status: DC

## 2016-04-27 MED ORDER — HYDROCODONE-ACETAMINOPHEN 5-325 MG PO TABS: 1.0000 | ORAL_TABLET | Freq: Four times a day (QID) | ORAL | Status: DC | PRN

## 2016-04-27 MED ORDER — HYDROCODONE-ACETAMINOPHEN 5-325 MG PO TABS
1.0000 | ORAL_TABLET | ORAL | Status: DC | PRN
  Administered 2016-04-27 (×2): 2 via ORAL
  Administered 2016-04-28: 1 via ORAL
  Administered 2016-04-28: 2 via ORAL
  Administered 2016-04-28: 1 via ORAL
  Administered 2016-04-28 (×2): 2 via ORAL
  Administered 2016-04-29: 1 via ORAL
  Filled 2016-04-27 (×2): qty 1
  Filled 2016-04-27 (×3): qty 2
  Filled 2016-04-27: qty 1
  Filled 2016-04-27: qty 2
  Filled 2016-04-27: qty 1
  Filled 2016-04-27: qty 2

## 2016-04-27 MED ORDER — ASPIRIN EC 325 MG PO TBEC: 325.0000 mg | DELAYED_RELEASE_TABLET | Freq: Two times a day (BID) | ORAL | Status: DC

## 2016-04-27 NOTE — NC FL2 (Signed)
[place] MEDICAID FL2 LEVEL OF CARE SCREENING TOOL     IDENTIFICATION  Patient Name: Melissa Fischer Birthdate: 1934/02/08 Sex: female Admission Date (Current Location): 04/25/2016  [facility] and [place] Number:  Herbalist and Address:  [facility],  [place] [address], [place]      Provider Number: [id number]  Attending Physician Name and Address:  Orson Eva, MD  Relative Name and Phone Number:       Current Level of Care: Hospital Recommended Level of Care: [place] Prior Approval Number:    Date Approved/Denied:   PASRR Number: [id number]  Discharge Plan: SNF    Current Diagnoses: Patient Active Problem List   Diagnosis Date Noted  . Hip fracture ([place]) 04/26/2016  . Type 2 diabetes mellitus with hyperglycemia ([place]) 04/26/2016  . COPD (chronic obstructive pulmonary disease) ([place]) 04/26/2016  . Closed right hip fracture ([place]) 04/26/2016  . Subcapital fracture of right femur ([place])   . Alzheimer's dementia with behavioral disturbance 03/31/2016  . Dementia with behavioral disturbance 10/30/2015  . DM (diabetes mellitus) type II controlled, neurological manifestation ([place]) 10/30/2015  . Depression 10/30/2015  . CKD (chronic kidney disease) 10/30/2015    Orientation RESPIRATION BLADDER Height & Weight     Place, Self  Normal Continent Weight: 69.8 kg (153 lb 14.1 oz) Height:  5' 8.5" (174 cm)  BEHAVIORAL SYMPTOMS/MOOD NEUROLOGICAL BOWEL NUTRITION STATUS   (NONE)  (Alzheimers Disease) Continent Diet (Carb Modified)  AMBULATORY STATUS COMMUNICATION OF NEEDS Skin   Extensive Assist Verbally Surgical wounds (Incision of right hip.)                       Personal Care Assistance Level of Assistance  Bathing, Feeding, Dressing Bathing Assistance: Limited assistance Feeding assistance: Independent Dressing Assistance: Limited assistance     Functional Limitations Info  Sight, Hearing, Speech Sight Info:  Adequate Hearing Info: Adequate Speech Info: Adequate    SPECIAL CARE FACTORS FREQUENCY  PT (By licensed PT), OT (By licensed OT)     PT Frequency: 5/day OT Frequency: 5/day            Contractures Contractures Info: Not present    Additional Factors Info  Code Status, Allergies, Insulin Sliding Scale Code Status Info: Full Allergies Info: Aricept, Namenda, Penicillins, Statins, Sulfa Antibiotics   Insulin Sliding Scale Info: 3/day       Current Medications (04/27/2016):  This is the current hospital active medication list Current Facility-Administered Medications  Medication Dose Route Frequency Provider Last Rate Last Dose  . acetaminophen (TYLENOL) tablet 650 mg  650 mg Oral Q6H PRN Grier Mitts, PA-C       Or  . acetaminophen (TYLENOL) suppository 650 mg  650 mg Rectal Q6H PRN Grier Mitts, PA-C      . aspirin EC tablet 325 mg  325 mg Oral BID Grier Mitts, PA-C   325 mg at 04/27/16 1003  . camphor-menthol (SARNA) lotion   Topical PRN Orson Eva, MD      . folic acid (FOLVITE) tablet 1 mg  1 mg Oral Daily Rise Patience, MD   1 mg at 04/27/16 1003  . HYDROcodone-acetaminophen (NORCO/VICODIN) 5-325 MG per tablet 1-2 tablet  1-2 tablet Oral Q6H PRN Grier Mitts, PA-C   1 tablet at 04/27/16 1122  . insulin aspart (novoLOG) injection 0-9 Units  0-9 Units Subcutaneous TID WC Rise Patience, MD   1 Units at 04/27/16 1003  . LORazepam (ATIVAN) tablet  1 mg  1 mg Oral Q6H PRN Rise Patience, MD       Or  . LORazepam (ATIVAN) injection 1 mg  1 mg Intravenous Q6H PRN Rise Patience, MD   1 mg at 04/27/16 0413  . menthol-cetylpyridinium (CEPACOL) lozenge 3 mg  1 lozenge Oral PRN Grier Mitts, PA-C       Or  . phenol (CHLORASEPTIC) mouth spray 1 spray  1 spray Mouth/Throat PRN Grier Mitts, PA-C      . metoCLOPramide (REGLAN) tablet 5-10 mg  5-10 mg Oral Q8H PRN Grier Mitts, PA-C       Or  . metoCLOPramide (REGLAN)  injection 5-10 mg  5-10 mg Intravenous Q8H PRN Danielle Laliberte, PA-C      . mometasone-formoterol (DULERA) 200-5 MCG/ACT inhaler 2 puff  2 puff Inhalation BID Rise Patience, MD   2 puff at 04/26/16 2100  . morphine 2 MG/ML injection 0.5 mg  0.5 mg Intravenous Q2H PRN Grier Mitts, PA-C   0.5 mg at 04/27/16 0608  . multivitamin with minerals tablet 1 tablet  1 tablet Oral Daily Rise Patience, MD   1 tablet at 04/26/16 1000  . ondansetron (ZOFRAN) tablet 4 mg  4 mg Oral Q6H PRN Grier Mitts, PA-C       Or  . ondansetron (ZOFRAN) injection 4 mg  4 mg Intravenous Q6H PRN Grier Mitts, PA-C   4 mg at 04/26/16 1710  . thiamine (VITAMIN B-1) tablet 100 mg  100 mg Oral Daily Rise Patience, MD   100 mg at 04/27/16 1003   Or  . thiamine (B-1) injection 100 mg  100 mg Intravenous Daily Rise Patience, MD   100 mg at 04/26/16 1200     Discharge Medications: Please see discharge summary for a list of discharge medications.  Relevant Imaging Results:  Relevant Lab Results:   Additional Information SSN: [id number]  Rigoberto Noel, [name]

## 2016-04-27 NOTE — Progress Notes (Signed)
Initial Nutrition Assessment  INTERVENTION:   Provide Glucerna Shake po TID, each supplement provides 220 kcal and 10 grams of protein Encourage PO intake RD to continue to monitor  NUTRITION DIAGNOSIS:   Increased nutrient needs related to other (see comment) (surgical healing) as evidenced by estimated needs.  GOAL:   Patient will meet greater than or equal to 90% of their needs  MONITOR:   PO intake, Supplement acceptance, Labs, Weight trends, Skin, I & O's  REASON FOR ASSESSMENT:   Consult Hip fracture protocol  ASSESSMENT:   82 year old female with a history of diabetes mellitus, syncope requiring permanent pacemaker, COPD, Alzheimer's dementia, hyperlipidemia with right hip pain sustained after a fall getting out of her car. 4/29: s/p Procedure(s) (LRB): CANNULATED HIP PINNING (Right)  Patient asleep in room with no family present. Pt recently received pain medication prior to visit. Per chart review, pt has not been eating well since surgery, 0-25%. Pt would benefit from nutritional supplementation to aid in healing. RD to order Glucerna shakes TID. Will monitor intake. Per weight history, pt's weight has remained stable. Patient with no sign of depletion of muscle mass or body fat.  Medications: Folic acid tablet daily, Multivitamin with minerals daily, Thiamine tablet daily, Vicodin PRN Labs reviewed: CBGs: 137-166 Low Na  Diet Order:  Diet Carb Modified Fluid consistency:: Thin; Room service appropriate?: Yes  Skin:  Wound (see comment) (Rt hip incision)  Last BM:  4/28  Height:   Ht Readings from Last 1 Encounters:  04/26/16 5' 8.5" (1.74 m)    Weight:   Wt Readings from Last 1 Encounters:  04/26/16 153 lb 14.1 oz (69.8 kg)    Ideal Body Weight:  65.9 kg  BMI:  Body mass index is 23.05 kg/(m^2).  Estimated Nutritional Needs:   Kcal:  [id number]  Protein:  80-90g  Fluid:  2L/day  EDUCATION NEEDS:   No education needs identified at this  time  Clayton Bibles, MS, RD, LDN Pager: [phone] After Hours Pager: [phone]

## 2016-04-27 NOTE — Clinical Social Work Note (Signed)
Clinical Social Work Assessment  Patient Details  Name: Melissa Fischer MRN: [id number] Date of Birth: 05-25-1934  Date of referral:  04/27/16               Reason for consult:  Facility Placement, Discharge Planning                Permission sought to share information with:  Facility Sport and exercise psychologist, Family Supports Permission granted to share information::  Yes, Verbal Permission Granted  Name::     [name]  Agency::  SNFs  Relationship::  Daughter  Contact Information:  [id number]  Housing/Transportation Living arrangements for the past 2 months:  Single Family Home Source of Information:  Adult Children Patient Interpreter Needed:  None Criminal Activity/Legal Involvement Pertinent to Current Situation/Hospitalization:  No - Comment as needed Significant Relationships:  Adult Children Lives with:  Adult Children Do you feel safe going back to the place where you live?  Yes Need for family participation in patient care:  Yes (Comment)  Care giving concerns:  The patient and family feel that the patient will likely need SNF at discharge for short term rehab.   Social Worker assessment / plan:  CSW was approached by daughter on unit requesting to speak with CSW regarding rehab placement. The patient is from home with the daughter but the daughter does not feel she can bring the patient home at this time. Per daughter the patient may be a little resistant to placement at first but she will talk to regarding this. The daughter has a preference for [facility].The daughter states that ultimately she plans for the patient to transition to ALF after rehab, preferably [facility]. CSW explained SNF search/placement process and answered the daughter's questions.   Employment status:  Retired Forensic scientist:  Medicare PT Recommendations:  Not assessed at this time [place] / Referral to community resources:  [place]  Patient/Family's Response to  care:  The patient's daughter reports that she is happy with the care the patient is receiving and appreciates CSW's assistance with CSW related needs.  Patient/Family's Understanding of and Emotional Response to Diagnosis, Current Treatment, and Prognosis: The patient's daughter appears to have a good understanding of the reason for the patient's admission and care plan. She understands what the patient's post DC needs are.   Emotional Assessment Appearance:  Appears stated age Attitude/Demeanor/Rapport:  Unable to Assess Affect (typically observed):  Unable to Assess (Attempting to work with PT.) Orientation:  Oriented to Self, Oriented to Place Alcohol / Substance use:  Not Applicable Psych involvement (Current and /or in the community):  No (Comment)  Discharge Needs  Concerns to be addressed:  Discharge Planning Concerns Readmission within the last 30 days:  No Current discharge risk:  Chronically ill, Physical Impairment Barriers to Discharge:  Continued Medical Work up  [employer] MSW, [place], [place], [id number]

## 2016-04-27 NOTE — Progress Notes (Signed)
Pt moaning. Stated "I am not in pain, I just don't know what to do!" Began scratching at her IV which was assessed and in normal limits. Started to cuss at nurse saying "I am losing my mind. I can't do this!" Appeared very anxious. Ativan 1mg  given with relief noted. Will continue to monitor.

## 2016-04-27 NOTE — Evaluation (Signed)
Physical Therapy Evaluation Patient Details Name: Melissa Fischer MRN: [id number] DOB: [birth year] Today's Date: 04/27/2016   History of Present Illness  Melissa Fischer is a 82 y.o. female with medical history significant of with history of pacemaker placement for syncope/hypotension, DM, Alzheimers, memory loss,  peripheral neuropathy, and COPD was brought to the ER after patient had a fall as witnessed by patient's daughter with right hip pain and found to have Right impacted femoral neck fracture, now s/p cannulated hip pinning   Clinical Impression  Pt s/p Right hip pinning presents with decreased R LE strength/ROM, elevated post op pain and premorbid cognitive deficits limiting functional mobility.  Pt would benefit from follow up rehab at SNF level to maximize IND and safety.  This session ltd, despite premed,  by pt's escalating pain level with pt crying, offering no assistance to movement and intermittently resisting movement.  For pt and staff safety, pt returned to supine and repositioned with assist of 3 including RN.    Follow Up Recommendations SNF    Equipment Recommendations  None recommended by PT    Recommendations for Other Services OT consult     Precautions / Restrictions Precautions Precautions: Fall Restrictions Weight Bearing Restrictions: No RLE Weight Bearing: Weight bearing as tolerated      Mobility  Bed Mobility Overal bed mobility: Needs Assistance Bed Mobility: Supine to Sit;Sit to Supine;Rolling Rolling: Total assist;+2 for physical assistance (two to assist pt to roll and third person to place pad)   Supine to sit: Total assist;+2 for physical assistance;HOB elevated Sit to supine: Total assist;+2 for physical assistance   General bed mobility comments: Pt not attempting to help at all, crying out that hit hurts. At times resisting movement as we tried to scoot her forward towards the EOB  Transfers                 General transfer comment: NT  2* pt pain level and increasing agitation  Ambulation/Gait                Stairs            Wheelchair Mobility    Modified Rankin (Stroke Patients Only)       Balance Overall balance assessment: Needs assistance Sitting-balance support: Bilateral upper extremity supported Sitting balance-Leahy Scale: Zero Sitting balance - Comments: No attempting to A with balance at times total support and other times total support with resistance to movement (pushing backwards) Postural control: Posterior lean                                   Pertinent Vitals/Pain Pain Assessment: Faces Faces Pain Scale: Hurts worst Pain Location: R hip Pain Descriptors / Indicators: Crying;Grimacing;Guarding Pain Intervention(s): Limited activity within patient's tolerance;Monitored during session;Premedicated before session    Home Living Family/patient expects to be discharged to:: Skilled nursing facility Living Arrangements: Children                    Prior Function           Comments: Livign with Dtr who assisted as needed     Hand Dominance   Dominant Hand: Right    Extremity/Trunk Assessment   Upper Extremity Assessment: Generalized weakness           Lower Extremity Assessment: RLE deficits/detail         Communication   Communication: No difficulties  Cognition  Arousal/Alertness: Awake/alert Behavior During Therapy: Anxious;Agitated Overall Cognitive Status: History of cognitive impairments - at baseline (Difficulty following cues)                      General Comments      Exercises        Assessment/Plan    PT Assessment Patient needs continued PT services  PT Diagnosis Difficulty walking   PT Problem List Decreased strength;Decreased range of motion;Decreased activity tolerance;Decreased balance;Decreased mobility;Decreased knowledge of use of DME;Decreased cognition;Decreased safety awareness;Pain  PT  Treatment Interventions DME instruction;Gait training;Functional mobility training;Therapeutic activities;Therapeutic exercise;Patient/family education   PT Goals (Current goals can be found in the Care Plan section) Acute Rehab PT Goals Patient Stated Goal: to lay back down and not move. Daughter wants her up and moving PT Goal Formulation: With patient/family Time For Goal Achievement: 05/03/16 Potential to Achieve Goals: Fair    Frequency Min 3X/week   Barriers to discharge        Co-evaluation PT/OT/SLP Co-Evaluation/Treatment: Yes Reason for Co-Treatment: For patient/therapist safety PT goals addressed during session: Mobility/safety with mobility OT goals addressed during session: Strengthening/ROM       End of Session   Activity Tolerance: Patient limited by pain Patient left: in bed;with call bell/phone within reach;with family/visitor present Nurse Communication: Mobility status;Other (comment) (Pain level despite premed)         Time: [id number] PT Time Calculation (min) (ACUTE ONLY): 25 min   Charges:   PT Evaluation $PT Eval Moderate Complexity: 1 Procedure     PT G Codes:        Melissa Fischer 05/20/2016, 1:03 PM

## 2016-04-27 NOTE — Discharge Summary (Signed)
Physician Discharge Summary  Melissa Fischer [id number] DOB: 1934/05/25 DOA: 04/25/2016  PCP: Mathews Argyle, MD  Admit date: 04/25/2016 Discharge date: 04/28/2016  Recommendations for Outpatient Follow-up:  1. Pt will need to follow up with PCP in 2 weeks post discharge 2. Please obtain BMP and CBC in one week 3. Please follow up on HbA1c  Discharge Diagnoses:  Right subcapital femoral neck fracture -04/26/2016--cannulated titanium screws x3--Dr. Tamera Punt -control pain -PT/OT-->SNF -ASA 325 mg bid for DVT prophylaxis x 21 days -appreciate ortho  Diabetes mellitus type 2 -Hemoglobin A1c--pending at time of d/c -Discontinue metformin while inpatient--restart after d/c -NovoLog sliding scale  History of syncope, status post PPM -follows Dr. Olin Pia -PPM last interrogated prior to admission on 01/30/2016  COPD -Stable presently  -Resume Dulera post-op  Alzheimer's dementia -[facility], Dr. Jaynee Eagles -could not tolerate Namenda, Aricept, Exelon in past -ativan prn agitation--she had some sundowning during the admission  Discharge Condition: stable  Disposition: SNF Follow-up Information    Follow up with Melissa Sells, MD. Schedule an appointment as soon as possible for a visit in 2 weeks.   Specialty:  Orthopedic Surgery   Contact information:   [address] [place] [id number] [phone]       Diet:carb modified Wt Readings from Last 3 Encounters:  04/26/16 69.8 kg (153 lb 14.1 oz)  03/31/16 70.217 kg (154 lb 12.8 oz)  01/30/16 71.396 kg (157 lb 6.4 oz)    History of present illness:  82 year old female with a history of diabetes mellitus, syncope requiring permanent pacemaker, COPD, Alzheimer's dementia, hyperlipidemia presented after a mechanical fall getting out of the car. The patient and her head and complained of right hip pain. She presented to the emergency department where x-ray and CT of the right hip  revealed right subcapital femoral neck fracture. Orthopedics, Dr. Tamera Punt, was consulted, and surgery is planned on 04/26/2016. The patient denied any chest discomfort, shortness of breath, dizziness, syncopal. From a functional standpoint, the patient is normally able to perform her ADLs without any difficulty. She is able to walk around the block without chest discomfort or shortness of breath. There is no prior history of stroke or MI. The patient underwent hip pinning on 04/26/2016. Physical therapy was consulted and recommended skilled nursing facility. The patient's opioids were adjusted. She remained afebrile and hemodynamically stable.  Consultants: Ortho--[place]  Discharge Exam: Filed Vitals:   04/28/16 0100 04/28/16 0605  BP: 132/77 150/61  Pulse: 76 74  Temp: 98 F (36.7 C) 98.3 F (36.8 C)  Resp: 16 16   Filed Vitals:   04/27/16 2148 04/28/16 0100 04/28/16 0605 04/28/16 0932  BP: 147/70 132/77 150/61   Pulse: 87 76 74   Temp: 98.2 F (36.8 C) 98 F (36.7 C) 98.3 F (36.8 C)   TempSrc: Oral Oral Oral   Resp: 18 16 16    Height:      Weight:      SpO2: 98% 94% 99% 96%   General: Alert and awake, NAD, pleasant, cooperative Cardiovascular: RRR, no rub, no gallop, no S3 Respiratory: CTA. No wheezing. Abdomen:soft, nontender, nondistended, positive bowel sounds Extremities: trace edema, No lymphangitis, no petechiae  Discharge Instructions      Discharge Instructions    Diet - low sodium heart healthy    Complete by:  As directed      Increase activity slowly    Complete by:  As directed      Weight bearing as tolerated    Complete  by:  As directed             Medication List    STOP taking these medications        ADVIL 200 MG tablet  Generic drug:  ibuprofen      TAKE these medications        aspirin EC 325 MG tablet  Take 1 tablet (325 mg total) by mouth 2 (two) times daily.     DULERA 200-5 MCG/ACT Aero  Generic drug:   mometasone-formoterol  Inhale 2 puffs into the lungs 2 (two) times daily.     feeding supplement (GLUCERNA SHAKE) Liqd  Take 237 mLs by mouth 3 (three) times daily between meals.     fexofenadine 180 MG tablet  Commonly known as:  ALLEGRA  Take 180 mg by mouth daily.     HYDROcodone-acetaminophen 5-325 MG tablet  Commonly known as:  NORCO/VICODIN  Take 1-2 tablets by mouth every 6 (six) hours as needed for moderate pain.     metFORMIN 500 MG 24 hr tablet  Commonly known as:  GLUCOPHAGE-XR  Take 500 mg by mouth daily.         The results of significant diagnostics from this hospitalization (including imaging, microbiology, ancillary and laboratory) are listed below for reference.    Significant Diagnostic Studies: Dg Chest 1 View  04/26/2016  CLINICAL DATA:  Preop exam for hip fracture EXAM: CHEST 1 VIEW COMPARISON:  None. FINDINGS: Normal heart size and mediastinal contours. Dual-chamber pacer from the left. No acute infiltrate or edema. No effusion or pneumothorax. No acute osseous findings. IMPRESSION: No evidence of active disease or thoracic injury. Electronically Signed   By: Monte Fantasia M.D.   On: 04/26/2016 00:55   Ct Head Wo Contrast  04/25/2016  CLINICAL DATA:  82 year old female with history of trauma from a fall while getting out of a car earlier this evening. Hematoma on the right side of the forehead. EXAM: CT HEAD WITHOUT CONTRAST TECHNIQUE: Contiguous axial images were obtained from the base of the skull through the vertex without intravenous contrast. COMPARISON:  No priors. FINDINGS: Soft tissue swelling in the right frontal scalp laterally where there is some high attenuation, compatible with a small scalp hematoma. Mild cerebral atrophy. Patchy and confluent areas of decreased attenuation are noted throughout the deep and periventricular white matter of the cerebral hemispheres bilaterally, compatible with chronic microvascular ischemic disease. No acute displaced  skull fractures are identified. No acute intracranial abnormality. Specifically, no evidence of acute post-traumatic intracranial hemorrhage, no definite regions of acute/subacute cerebral ischemia, no focal mass, mass effect, hydrocephalus or abnormal intra or extra-axial fluid collections. The visualized paranasal sinuses and mastoids are well pneumatized. IMPRESSION: 1. Small right lateral frontal scalp hematoma. 2. No acute displaced skull fractures or evidence of significant acute traumatic injury to the brain. 3. Mild cerebral atrophy with extensive chronic microvascular ischemic changes in the cerebral white matter. Electronically Signed   By: Vinnie Langton M.D.   On: 04/25/2016 23:55   Ct Pelvis Wo Contrast  04/26/2016  CLINICAL DATA:  Fall with right hip and pelvic pain. Initial encounter. EXAM: CT PELVIS WITHOUT CONTRAST TECHNIQUE: Multidetector CT imaging of the pelvis was performed following the standard protocol without intravenous contrast. Dedicated reformats of the pelvis and right hip were obtained. COMPARISON:  Radiography from late yesterday FINDINGS: Re- demonstrated subcapital right femoral neck fracture with impaction laterally. No involvement along the articular surface of the femoral head. There is no pelvic fracture. Degenerative  spurring to the right acetabulum is mild. No hip joint narrowing. No myotendinous disruption. Osteopenia. No posttraumatic finding to the visceral pelvis. Lower lumbar disc and facet degeneration. IMPRESSION: Subcapital right femoral neck fracture with lateral impaction. Electronically Signed   By: Monte Fantasia M.D.   On: 04/26/2016 00:53   Pelvis Portable  04/26/2016  CLINICAL DATA:  Status post right hip pinning. EXAM: PORTABLE PELVIS 1-2 VIEWS COMPARISON:  CT pelvis dated 04/26/2016. FINDINGS: Interval placement of 3 fixation screws which traverse the right femoral neck fracture site. The screws appear intact and appropriately positioned. Osseous  alignment is anatomic. Expected postsurgical changes are seen within the surrounding soft tissues. No evidence of surgical complicating feature. IMPRESSION: Status post placement of 3 fixation screws which traverse the right femoral neck fracture site. Osseous alignment is anatomic. No evidence of surgical complicating feature. Electronically Signed   By: Franki Cabot M.D.   On: 04/26/2016 14:58   Ct Hip Right Wo Contrast  04/26/2016  CLINICAL DATA:  Fall with right hip and pelvic pain. Initial encounter. EXAM: CT PELVIS WITHOUT CONTRAST TECHNIQUE: Multidetector CT imaging of the pelvis was performed following the standard protocol without intravenous contrast. Dedicated reformats of the pelvis and right hip were obtained. COMPARISON:  Radiography from late yesterday FINDINGS: Re- demonstrated subcapital right femoral neck fracture with impaction laterally. No involvement along the articular surface of the femoral head. There is no pelvic fracture. Degenerative spurring to the right acetabulum is mild. No hip joint narrowing. No myotendinous disruption. Osteopenia. No posttraumatic finding to the visceral pelvis. Lower lumbar disc and facet degeneration. IMPRESSION: Subcapital right femoral neck fracture with lateral impaction. Electronically Signed   By: Monte Fantasia M.D.   On: 04/26/2016 00:53   Dg Hip Operative Unilat With Pelvis Right  04/26/2016  CLINICAL DATA:  ORIF right hip fracture EXAM: OPERATIVE RIGHT HIP (WITH PELVIS IF PERFORMED) 2 VIEWS TECHNIQUE: Fluoroscopic spot image(s) were submitted for interpretation post-operatively. COMPARISON:  04/25/2016 FINDINGS: Three pins through right femoral neck fracture. No significant displacement. Radiation Exposure Index (as provided by the fluoroscopic device): 7.18 mGy IMPRESSION: ORIF right femoral neck fracture Electronically Signed   By: Skipper Cliche M.D.   On: 04/26/2016 14:11   Dg Hip Unilat With Pelvis 2-3 Views Right  04/25/2016  CLINICAL  DATA:  Fall with hip pain on the right.  Initial encounter. EXAM: DG HIP (WITH OR WITHOUT PELVIS) 2-3V RIGHT COMPARISON:  None. FINDINGS: Right femoral neck fracture with lateral impaction. No notable degenerative changes to the acetabula. No evidence of pelvic ring fracture or diastasis. IMPRESSION: Mildly impacted subcapital right femoral neck fracture. Electronically Signed   By: Monte Fantasia M.D.   On: 04/25/2016 23:44     Microbiology: Recent Results (from the past 240 hour(s))  Surgical pcr screen     Status: None   Collection Time: 04/26/16  6:45 AM  Result Value Ref Range Status   MRSA, PCR NEGATIVE NEGATIVE Final   Staphylococcus aureus NEGATIVE NEGATIVE Final    Comment:        The Xpert SA Assay (FDA approved for NASAL specimens in patients over 61 years of age), is one component of a comprehensive surveillance program.  Test performance has been validated by [facility] for patients greater than or equal to 25 year old. It is not intended to diagnose infection nor to guide or monitor treatment.      Labs: Basic Metabolic Panel:  Recent Labs Lab 04/26/16 0008 04/26/16 [id number] 04/27/16 0411  04/28/16 0410  NA 134* 135 133* 133*  K 3.8 4.2 4.3 4.5  CL 100* 103 101 101  CO2 21* 21* 23 24  GLUCOSE 217* 191* 183* 202*  BUN 14 13 10 10   CREATININE 0.79 0.74 0.77 0.68  CALCIUM 9.7 9.1 8.7* 8.7*   Liver Function Tests:  Recent Labs Lab 04/26/16 0506  AST 23  ALT 19  ALKPHOS 58  BILITOT 0.9  PROT 6.4*  ALBUMIN 4.0   No results for input(s): LIPASE, AMYLASE in the last 168 hours. No results for input(s): AMMONIA in the last 168 hours. CBC:  Recent Labs Lab 04/26/16 0008 04/26/16 0506 04/27/16 0411 04/28/16 0410  WBC 9.5 10.4 8.9 8.4  NEUTROABS 5.8 7.6  --   --   HGB 14.1 13.4 12.8 12.6  HCT 40.8 38.8 37.2 36.4  MCV 89.5 89.2 90.3 89.9  PLT 218 193 157 161   Cardiac Enzymes: No results for input(s): CKTOTAL, CKMB, CKMBINDEX, TROPONINI in the  last 168 hours. BNP: Invalid input(s): POCBNP CBG:  Recent Labs Lab 04/27/16 1314 04/27/16 1804 04/27/16 2255 04/28/16 0756 04/28/16 1156  GLUCAP 131* 175* 248* 159* 233*    Time coordinating discharge:  Greater than 30 minutes  Signed:  Teryn Gust, DO [facility] Pager: [phone] 04/28/2016, 1:05 PM

## 2016-04-27 NOTE — Progress Notes (Signed)
PROGRESS NOTE  Melissa Fischer [id number] DOB: 11-07-1934 DOA: 04/25/2016 PCP: Mathews Argyle, MD  Outpatient Specialists: [facility]  Brief History:  82 year old female with a history of diabetes mellitus, syncope requiring permanent pacemaker, COPD, Alzheimer's dementia, hyperlipidemia presented after a mechanical fall getting out of the car. The patient and her head and complained of right hip pain. She presented to the emergency department where x-ray and CT of the right hip revealed right subcapital femoral neck fracture. Orthopedics, Dr. Tamera Punt, was consulted, and surgery is planned on 04/26/2016. The patient denied any chest discomfort, shortness of breath, dizziness, syncopal. From a functional standpoint, the patient is normally able to perform her ADLs without any difficulty. She is able to walk around the block without chest discomfort or shortness of breath. There is no prior history of stroke or MI.  Assessment/Plan: Right subcapital femoral neck fracture -04/26/2016--cannulated titanium screws x3--Dr. Tamera Punt -control pain -PT/OT-->SNF -ASA 325 mg bid for DVT prophylaxis -appreciate ortho  Diabetes mellitus type 2 -Hemoglobin A1c--pending -Discontinue metformin while inpatient -NovoLog sliding scale  History of syncope, status post PPM -follows Dr. Olin Pia -PPM last interrogated prior to admission on 01/30/2016  COPD -Stable presently  -Resume Dulera post-op  Alzheimer's dementia -Follows Neurology, Dr. Jaynee Eagles -could not tolerate Namenda, Aricept, Exelon in past -ativan prn agitation     Disposition Plan: SNF 04/28/16 if stable Family Communication: Daughter updated at beside 04/27/16  Consultants: Artemio Aly  Code Status: FULL  Subjective: Patient denies fevers, chills, headache, chest pain, dyspnea, nausea, vomiting, diarrhea, abdominal pain, dysuria, hematuria   Objective: Filed Vitals:     04/26/16 2152 04/27/16 0639 04/27/16 0945 04/27/16 1555  BP: 152/57 142/53 151/73 114/57  Pulse: 80 76 75 75  Temp:  98.5 F (36.9 C) 98.7 F (37.1 C) 98.5 F (36.9 C)  TempSrc: Oral Oral Oral Oral  Resp: 16 14 14 16   Height:      Weight:      SpO2: 100% 99% 100% 100%    Intake/Output Summary (Last 24 hours) at 04/27/16 1621 Last data filed at 04/27/16 1550  Gross per 24 hour  Intake 2178.75 ml  Output   1550 ml  Net 628.75 ml   Weight change:  Exam:   General:  Pt is alert, follows commands appropriately, not in acute distress  HEENT: No icterus, No thrush, No neck mass, [place] Sleep/AT  Cardiovascular: RRR, S1/S2, no rubs, no gallops  Respiratory: Fine bibasilar crackles without wheezing. Good air movement.rding  Abd:  Soft/ nt, +BS  Extremities: trace LE edema, No lymphangitis, No petechiae, No rashes, no synovitis   Data Reviewed: I have personally reviewed following labs and imaging studies Basic Metabolic Panel:  Recent Labs Lab 04/26/16 0008 04/26/16 0506 04/27/16 0411  NA 134* 135 133*  K 3.8 4.2 4.3  CL 100* 103 101  CO2 21* 21* 23  GLUCOSE 217* 191* 183*  BUN 14 13 10   CREATININE 0.79 0.74 0.77  CALCIUM 9.7 9.1 8.7*   Liver Function Tests:  Recent Labs Lab 04/26/16 0506  AST 23  ALT 19  ALKPHOS 58  BILITOT 0.9  PROT 6.4*  ALBUMIN 4.0   No results for input(s): LIPASE, AMYLASE in the last 168 hours. No results for input(s): AMMONIA in the last 168 hours. Coagulation Profile: No results for input(s): INR, PROTIME in the last 168 hours. CBC:  Recent Labs Lab 04/26/16 0008 04/26/16 0506 04/27/16 0411  WBC 9.5 10.4  8.9  NEUTROABS 5.8 7.6  --   HGB 14.1 13.4 12.8  HCT 40.8 38.8 37.2  MCV 89.5 89.2 90.3  PLT 218 193 157   Cardiac Enzymes: No results for input(s): CKTOTAL, CKMB, CKMBINDEX, TROPONINI in the last 168 hours. BNP: Invalid input(s): POCBNP CBG:  Recent Labs Lab 04/26/16 1448 04/26/16 1712 04/26/16 2224  04/27/16 0827 04/27/16 1314  GLUCAP 129* 159* 166* 137* 131*   HbA1C: No results for input(s): HGBA1C in the last 72 hours. Urine analysis: No results found for: COLORURINE, APPEARANCEUR, LABSPEC, PHURINE, GLUCOSEU, HGBUR, BILIRUBINUR, KETONESUR, PROTEINUR, UROBILINOGEN, NITRITE, LEUKOCYTESUR Sepsis Labs: @LABRCNTIP (procalcitonin:4,lacticidven:4) ) Recent Results (from the past 240 hour(s))  Surgical pcr screen     Status: None   Collection Time: 04/26/16  6:45 AM  Result Value Ref Range Status   MRSA, PCR NEGATIVE NEGATIVE Final   Staphylococcus aureus NEGATIVE NEGATIVE Final    Comment:        The Xpert SA Assay (FDA approved for NASAL specimens in patients over 64 years of age), is one component of a comprehensive surveillance program.  Test performance has been validated by [facility] for patients greater than or equal to 59 year old. It is not intended to diagnose infection nor to guide or monitor treatment.      Scheduled Meds: . aspirin EC  325 mg Oral BID  . feeding supplement (GLUCERNA SHAKE)  237 mL Oral TID BM  . folic acid  1 mg Oral Daily  . insulin aspart  0-9 Units Subcutaneous TID WC  . mometasone-formoterol  2 puff Inhalation BID  . multivitamin with minerals  1 tablet Oral Daily  . thiamine  100 mg Oral Daily   Or  . thiamine  100 mg Intravenous Daily   Continuous Infusions:   Procedures/Studies: Dg Chest 1 View  04/26/2016  CLINICAL DATA:  Preop exam for hip fracture EXAM: CHEST 1 VIEW COMPARISON:  None. FINDINGS: Normal heart size and mediastinal contours. Dual-chamber pacer from the left. No acute infiltrate or edema. No effusion or pneumothorax. No acute osseous findings. IMPRESSION: No evidence of active disease or thoracic injury. Electronically Signed   By: Monte Fantasia M.D.   On: 04/26/2016 00:55   Ct Head Wo Contrast  04/25/2016  CLINICAL DATA:  82 year old female with history of trauma from a fall while getting out of a car earlier  this evening. Hematoma on the right side of the forehead. EXAM: CT HEAD WITHOUT CONTRAST TECHNIQUE: Contiguous axial images were obtained from the base of the skull through the vertex without intravenous contrast. COMPARISON:  No priors. FINDINGS: Soft tissue swelling in the right frontal scalp laterally where there is some high attenuation, compatible with a small scalp hematoma. Mild cerebral atrophy. Patchy and confluent areas of decreased attenuation are noted throughout the deep and periventricular white matter of the cerebral hemispheres bilaterally, compatible with chronic microvascular ischemic disease. No acute displaced skull fractures are identified. No acute intracranial abnormality. Specifically, no evidence of acute post-traumatic intracranial hemorrhage, no definite regions of acute/subacute cerebral ischemia, no focal mass, mass effect, hydrocephalus or abnormal intra or extra-axial fluid collections. The visualized paranasal sinuses and mastoids are well pneumatized. IMPRESSION: 1. Small right lateral frontal scalp hematoma. 2. No acute displaced skull fractures or evidence of significant acute traumatic injury to the brain. 3. Mild cerebral atrophy with extensive chronic microvascular ischemic changes in the cerebral white matter. Electronically Signed   By: Vinnie Langton M.D.   On: 04/25/2016 23:55  Ct Pelvis Wo Contrast  04/26/2016  CLINICAL DATA:  Fall with right hip and pelvic pain. Initial encounter. EXAM: CT PELVIS WITHOUT CONTRAST TECHNIQUE: Multidetector CT imaging of the pelvis was performed following the standard protocol without intravenous contrast. Dedicated reformats of the pelvis and right hip were obtained. COMPARISON:  Radiography from late yesterday FINDINGS: Re- demonstrated subcapital right femoral neck fracture with impaction laterally. No involvement along the articular surface of the femoral head. There is no pelvic fracture. Degenerative spurring to the right  acetabulum is mild. No hip joint narrowing. No myotendinous disruption. Osteopenia. No posttraumatic finding to the visceral pelvis. Lower lumbar disc and facet degeneration. IMPRESSION: Subcapital right femoral neck fracture with lateral impaction. Electronically Signed   By: Monte Fantasia M.D.   On: 04/26/2016 00:53   Pelvis Portable  04/26/2016  CLINICAL DATA:  Status post right hip pinning. EXAM: PORTABLE PELVIS 1-2 VIEWS COMPARISON:  CT pelvis dated 04/26/2016. FINDINGS: Interval placement of 3 fixation screws which traverse the right femoral neck fracture site. The screws appear intact and appropriately positioned. Osseous alignment is anatomic. Expected postsurgical changes are seen within the surrounding soft tissues. No evidence of surgical complicating feature. IMPRESSION: Status post placement of 3 fixation screws which traverse the right femoral neck fracture site. Osseous alignment is anatomic. No evidence of surgical complicating feature. Electronically Signed   By: Franki Cabot M.D.   On: 04/26/2016 14:58   Ct Hip Right Wo Contrast  04/26/2016  CLINICAL DATA:  Fall with right hip and pelvic pain. Initial encounter. EXAM: CT PELVIS WITHOUT CONTRAST TECHNIQUE: Multidetector CT imaging of the pelvis was performed following the standard protocol without intravenous contrast. Dedicated reformats of the pelvis and right hip were obtained. COMPARISON:  Radiography from late yesterday FINDINGS: Re- demonstrated subcapital right femoral neck fracture with impaction laterally. No involvement along the articular surface of the femoral head. There is no pelvic fracture. Degenerative spurring to the right acetabulum is mild. No hip joint narrowing. No myotendinous disruption. Osteopenia. No posttraumatic finding to the visceral pelvis. Lower lumbar disc and facet degeneration. IMPRESSION: Subcapital right femoral neck fracture with lateral impaction. Electronically Signed   By: Monte Fantasia M.D.   On:  04/26/2016 00:53   Dg Hip Operative Unilat With Pelvis Right  04/26/2016  CLINICAL DATA:  ORIF right hip fracture EXAM: OPERATIVE RIGHT HIP (WITH PELVIS IF PERFORMED) 2 VIEWS TECHNIQUE: Fluoroscopic spot image(s) were submitted for interpretation post-operatively. COMPARISON:  04/25/2016 FINDINGS: Three pins through right femoral neck fracture. No significant displacement. Radiation Exposure Index (as provided by the fluoroscopic device): 7.18 mGy IMPRESSION: ORIF right femoral neck fracture Electronically Signed   By: Skipper Cliche M.D.   On: 04/26/2016 14:11   Dg Hip Unilat With Pelvis 2-3 Views Right  04/25/2016  CLINICAL DATA:  Fall with hip pain on the right.  Initial encounter. EXAM: DG HIP (WITH OR WITHOUT PELVIS) 2-3V RIGHT COMPARISON:  None. FINDINGS: Right femoral neck fracture with lateral impaction. No notable degenerative changes to the acetabula. No evidence of pelvic ring fracture or diastasis. IMPRESSION: Mildly impacted subcapital right femoral neck fracture. Electronically Signed   By: Monte Fantasia M.D.   On: 04/25/2016 23:44    Macen Joslin, DO  [facility] Pager [phone]  If 7PM-7AM, please contact night-coverage [email or web address] Password [email or web address] 04/27/2016, 4:21 PM   LOS: 1 day

## 2016-04-27 NOTE — Evaluation (Signed)
Occupational Therapy Evaluation Patient Details Name: Melissa Fischer MRN: [id number] DOB: [birth year] Today's Date: 04/27/2016    History of Present Illness Melissa Fischer is a 82 y.o. female with medical history significant of with history of pacemaker placement for syncope/hypotension, DM, Alzheimers, memory loss,  peripheral neuropathy, and COPD was brought to the ER after patient had a fall as witnessed by patient's daughter with right hip pain and found to have Right impacted femoral neck fracture, now s/p cannulated hip pinning    Clinical Impression   This 82 yo female admitted and underwent above presents to acute OT with deficits below (see OT problem list) thus affecting her independence with basic ADLs. She will benefit from acute OT with follow up OT at SNF.    Follow Up Recommendations  SNF       Pt/family agree with results and  recommendations   Patient agreed with halting session where we did due to extreme pain based off her report and reaction to moving; daughter wanted [place] to try again (she entered room as we were laying pt back down)--explained to daughter that it was unsafe for patient and therapists to try again due to patient total A with resistance to movement at times and pt crying due pain. Explained that next session (possibly tomorrow from OT standpoint) that we would make sure she had pain meds at least an hour before she is seen (had it 50 minutes before we entered room for the eval) and ask the RN about giving her 2 pain pills instead of 1 to see if that makes a difference     Precautions / Restrictions Precautions Precautions: Fall Restrictions Weight Bearing Restrictions: No RLE Weight Bearing: Weight bearing as tolerated      Mobility Bed Mobility Overal bed mobility: Needs Assistance Bed Mobility: Supine to Sit;Rolling Rolling: Total assist;+2 for physical assistance (one to roll, one to help roll and support RLE, one to place pad under her)   Supine to  sit: Total assist;+2 for physical assistance;HOB elevated     General bed mobility comments: Pt not attempting to help at all, crying out that hit hurts. At times resisting movement as we tried to scoot her forward towards the EOB     Balance Overall balance assessment: Needs assistance;History of Falls Sitting-balance support: Bilateral upper extremity supported (leg supported by therapist due to trying to get pt to EOB, but she was not tolerating this) Sitting balance-Leahy Scale: Zero Sitting balance - Comments: No attempting to A with balance at times total support and other times total support with resistance to movement (pushing backwards) Postural control: Posterior lean                                  ADL                                         General ADL Comments: Total A at this time due to not tolerating activity due to pain. Was able to bring cup to mouth and take sips of water while supine in bed with HOB up               Pertinent Vitals/Pain Pain Assessment: Faces Faces Pain Scale: Hurts worst Pain Location: right hip Pain Descriptors / Indicators: Grimacing;Guarding;Crying (pt crying, saying stop, batting towards RN and  reaching for her hip as we rolled her to place pad under her) Pain Intervention(s): Limited activity within patient's tolerance;Monitored during session;Repositioned     Hand Dominance Right   Extremity/Trunk Assessment Upper Extremity Assessment Upper Extremity Assessment: Generalized weakness   Lower Extremity Assessment Lower Extremity Assessment: Defer to PT evaluation       Communication Communication Communication: No difficulties   Cognition Arousal/Alertness: Awake/alert Behavior During Therapy: Anxious;Agitated (due to pain) Overall Cognitive Status: History of cognitive impairments - at baseline (per chart memory issues and Alzheimers)                                Home Living  Family/patient expects to be discharged to:: Skilled nursing facility                                             OT Diagnosis: Generalized weakness;Cognitive deficits;Acute pain   OT Problem List: Decreased strength;Decreased range of motion;Decreased activity tolerance;Impaired balance (sitting and/or standing);Pain;Decreased cognition;Decreased knowledge of use of DME or AE   OT Treatment/Interventions: Self-care/ADL training    OT Goals(Current goals can be found in the care plan section) Acute Rehab OT Goals Patient Stated Goal: to lay back down and not move. Daughter wants her up and moving OT Goal Formulation: With patient/family Time For Goal Achievement: 06/10/16 Potential to Achieve Goals: Fair (due to pain)  OT Frequency: Min 2X/week           Co-evaluation PT/OT/SLP Co-Evaluation/Treatment: Yes Reason for Co-Treatment: For patient/therapist safety   OT goals addressed during session: Strengthening/ROM      End of Session Nurse Communication:  (Nurse in to help [place] with rolling pt to get pad under her in bed; aware pt not tolerating movement with only 1 pain pill at at time (pain pill given 50 minutes before we entered room))  Activity Tolerance: Patient limited by pain Patient left: in bed;with call bell/phone within reach;with family/visitor present   Time: 1050-1115 OT Time Calculation (min): 25 min Charges:  OT General Charges $OT Visit: 1 Procedure OT Evaluation $OT Eval Moderate Complexity: 1 Procedure  Almon Register [id number] 04/27/2016, 11:55 AM

## 2016-04-27 NOTE — Progress Notes (Signed)
   PATIENT ID: Melissa Fischer   1 Day Post-Op Procedure(s) (LRB): CANNULATED HIP PINNING (Right)  Subjective: Sleeping comfortably. Easily woken up, reports no pain in hip. Comfortable. No questions or concerns.  Objective:  Filed Vitals:   04/26/16 2152 04/27/16 0639  BP: 152/57 142/53  Pulse: 80 76  Temp:  98.5 F (36.9 C)  Resp: 16 14     R hip dressing c/d/i Wiggles toes, distally NVI Calf soft, nontender  Labs:   Recent Labs  04/26/16 0008 04/26/16 0506 04/27/16 0411  HGB 14.1 13.4 12.8   Recent Labs  04/26/16 0506 04/27/16 0411  WBC 10.4 8.9  RBC 4.35 4.12  HCT 38.8 37.2  PLT 193 157   Recent Labs  04/26/16 0506 04/27/16 0411  NA 135 133*  K 4.2 4.3  CL 103 101  CO2 21* 23  BUN 13 10  CREATININE 0.74 0.77  GLUCOSE 191* 183*  CALCIUM 9.1 8.7*    Assessment and Plan: 1 day s/p cannulated hip pinning right hip WBAT norco for pain control only if needed, tylenol otherwise Up with PT/OT today Appreciate medicine team's care D/c with norco for pain control and ASA 325mg  BID for DVT prophylaxis scripts in chart for d/c when appropriate per primary team Will continue to follow  VTE proph: ASA 325mg  BID, SCDs

## 2016-04-27 NOTE — Progress Notes (Signed)
Pt has foley in place and continues to state "I need to pee" Not putting out as much as she is taking in. Foley presents with flowing urine. Bladder scan done only showing 5ml. Will continue to monitor.

## 2016-04-27 NOTE — Clinical Social Work Placement (Signed)
   CLINICAL SOCIAL WORK PLACEMENT  NOTE  Date:  04/27/2016  Patient Details  Name: Melissa Fischer MRN: [id number] Date of Birth: 01-Jan-1934  Clinical Social Work is seeking post-discharge placement for this patient at the [place] level of care (*CSW will initial, date and re-position this form in  chart as items are completed):  Yes   Patient/family provided with [place] Work Department's list of facilities offering this level of care within the geographic area requested by the patient (or if unable, by the patient's family).  Yes   Patient/family informed of their freedom to choose among providers that offer the needed level of care, that participate in Medicare, Medicaid or managed care program needed by the patient, have an available bed and are willing to accept the patient.  Yes   Patient/family informed of 's ownership interest in [facility] and [facility], as well as of the fact that they are under no obligation to receive care at these facilities.  PASRR submitted to EDS on 04/27/16     PASRR number received on 04/27/16     Existing PASRR number confirmed on       FL2 transmitted to all facilities in geographic area requested by pt/family on 04/27/16     FL2 transmitted to all facilities within larger geographic area on       Patient informed that his/her managed care company has contracts with or will negotiate with certain facilities, including the following:            Patient/family informed of bed offers received.  Patient chooses bed at       Physician recommends and patient chooses bed at      Patient to be transferred to   on  .  Patient to be transferred to facility by       Patient family notified on   of transfer.  Name of family member notified:        PHYSICIAN Please prepare priority discharge summary, including medications, Please prepare prescriptions, Please sign FL2     Additional Comment:     _______________________________________________ Rigoberto Noel, [name] 04/27/2016, 3:38 PM

## 2016-04-28 ENCOUNTER — Encounter (HOSPITAL_COMMUNITY): Payer: Self-pay | Admitting: Orthopedic Surgery

## 2016-04-28 DIAGNOSIS — S72001D Fracture of unspecified part of neck of right femur, subsequent encounter for closed fracture with routine healing: Secondary | ICD-10-CM

## 2016-04-28 LAB — GLUCOSE, CAPILLARY
GLUCOSE-CAPILLARY: 159 mg/dL — AB (ref 65–99)
Glucose-Capillary: 233 mg/dL — ABNORMAL HIGH (ref 65–99)
Glucose-Capillary: 194 mg/dL — ABNORMAL HIGH (ref 65–99)

## 2016-04-28 LAB — CBC
HCT: 36.4 % (ref 36.0–46.0)
Hemoglobin: 12.6 g/dL (ref 12.0–15.0)
MCH: 31.1 pg (ref 26.0–34.0)
MCHC: 34.6 g/dL (ref 30.0–36.0)
MCV: 89.9 fL (ref 78.0–100.0)
PLATELETS: 161 10*3/uL (ref 150–400)
RBC: 4.05 MIL/uL (ref 3.87–5.11)
RDW: 12.8 % (ref 11.5–15.5)
WBC: 8.4 10*3/uL (ref 4.0–10.5)

## 2016-04-28 LAB — BASIC METABOLIC PANEL
Anion gap: 8 (ref 5–15)
BUN: 10 mg/dL (ref 4–21)
BUN: 10 mg/dL (ref 6–20)
CALCIUM: 8.7 mg/dL — AB (ref 8.9–10.3)
CO2: 24 mmol/L (ref 22–32)
CREATININE: 0.7 mg/dL (ref 0.5–1.1)
Chloride: 101 mmol/L (ref 101–111)
Creatinine, Ser: 0.68 mg/dL (ref 0.44–1.00)
GFR calc Af Amer: >60 mL/min (ref >60)
Glucose, Bld: 202 mg/dL — ABNORMAL HIGH (ref 65–99)
Glucose: 202 mg/dL
POTASSIUM: 4.5 mmol/L (ref 3.5–5.1)
SODIUM: 13 mmol/L — AB (ref 137–147)
SODIUM: 133 mmol/L — AB (ref 135–145)

## 2016-04-28 LAB — HEMOGLOBIN A1C
HEMOGLOBIN A1C: 8.2 % — AB (ref 4.8–5.6)
MEAN PLASMA GLUCOSE: 189 mg/dL

## 2016-04-28 NOTE — Progress Notes (Signed)
Physical Therapy Treatment Patient Details Name: Melissa Fischer MRN: [id number] DOB: 1934/03/19 Today's Date: 04/28/2016    History of Present Illness Melissa Fischer is a 82 y.o. female with medical history significant of with history of pacemaker placement for syncope/hypotension, DM, Alzheimers, memory loss,  peripheral neuropathy, and COPD was brought to the ER after patient had a fall as witnessed by patient's daughter with right hip pain and found to have Right impacted femoral neck fracture, now s/p cannulated hip pinning     PT Comments    The patient tolerated OOB to recliner very well, not much c/o pain.  Plans for SNF.  Follow Up Recommendations  SNF     Equipment Recommendations  None recommended by PT    Recommendations for Other Services       Precautions / Restrictions Precautions Precautions: Fall Restrictions RLE Weight Bearing: Weight bearing as tolerated    Mobility  Bed Mobility Overal bed mobility: Needs Assistance Bed Mobility: Supine to Sit;Rolling     Supine to sit: Max assist;HOB elevated;+2 for safety/equipment;+2 for physical assistance     General bed mobility comments: extra time to  move slowly and in increments. bed pad used to assist with scooting to the edge of the bed. Patient was able to continue scooting to edge once sitting upright using arms  Transfers Overall transfer level: Needs assistance Equipment used: Rolling walker (2 wheeled) Transfers: Sit to/from [facility] Sit to Stand: +2 physical assistance;+2 safety/equipment;From elevated surface;Max assist Stand pivot transfers: +2 safety/equipment;+2 physical assistance;Max assist       General transfer comment: multimodal cues for posture, hand position and  assist with the right leg position. very small steps to turn and back up to the recliner, multimodal cues for posture. Tends to heve head down, kyphotic.  Ambulation/Gait                 Stairs             Wheelchair Mobility    Modified Rankin (Stroke Patients Only)       Balance                                    Cognition Arousal/Alertness: Awake/alert   Overall Cognitive Status: History of cognitive impairments - at baseline                      Exercises General Exercises - Lower Extremity Long Arc Quad: Left;5 reps;Seated Heel Slides: AAROM;Left;5 reps;Supine    General Comments        Pertinent Vitals/Pain Faces Pain Scale: Hurts little more Pain Location: R hip Pain Descriptors / Indicators: Aching;Discomfort;Grimacing;Guarding Pain Intervention(s): Limited activity within patient's tolerance;Premedicated before session;Repositioned;Ice applied    Home Living                      Prior Function            PT Goals (current goals can now be found in the care plan section) Progress towards PT goals: Progressing toward goals    Frequency  Min 3X/week    PT Plan Current plan remains appropriate    Co-evaluation             End of Session Equipment Utilized During Treatment: Gait belt Activity Tolerance: Patient tolerated treatment well Patient left: in chair;with call bell/phone within reach;with chair alarm set;with family/visitor present  Time: [id number] PT Time Calculation (min) (ACUTE ONLY): 24 min  Charges:  $Therapeutic Activity: 23-37 mins                    G Codes:      Melissa Fischer 04/28/2016, 1:01 PM Melissa Fischer PT [phone]

## 2016-04-28 NOTE — Progress Notes (Signed)
[name] met with patient and family member at the bedside to discuss DC plan. Agreeable for DC on Tuesday. Call placed to Admission to schedule paperwork as requested by family member, message left. Will follow up with DC needs.  Lane Hacker, MSW Clinical Social Work: System [employer] [phone]

## 2016-04-28 NOTE — Care Management Note (Signed)
Case Management Note  Patient Details  Name: Auroara Hutsell MRN: [id number] Date of Birth: 1934-02-01  Subjective/Objective:   s/p cannulated hip pinning right hip                 Action/Plan: Discharge planning per CSW  Expected Discharge Date:                  Expected Discharge Plan:  Skilled Nursing Facility  In-House Referral:  Clinical Social Work  Discharge planning Services  CM Consult  Post Acute Care Choice:  NA Choice offered to:  NA  DME Arranged:  N/A DME Agency:  NA  HH Arranged:  NA HH Agency:  NA  Status of Service:  Completed, signed off  Medicare Important Message Given:    Date Medicare IM Given:    Medicare IM give by:    Date Additional Medicare IM Given:    Additional Medicare Important Message give by:     If discussed at [place] of Stay Meetings, dates discussed:    Additional Comments:  Guadalupe Maple, RN 04/28/2016, 11:13 AM [phone]

## 2016-04-28 NOTE — Progress Notes (Signed)
   PATIENT ID: Melissa Fischer   2 Days Post-Op Procedure(s) (LRB): CANNULATED HIP PINNING (Right)  Subjective: Denies right hip pain. States that she has been told her has dementia and does not believe she does. Per nursing was not cooperative with PT. No other complaints or concerns.   Objective:  Filed Vitals:   04/28/16 0100 04/28/16 0605  BP: 132/77 150/61  Pulse: 76 74  Temp: 98 F (36.7 C) 98.3 F (36.8 C)  Resp: 16 16     R hip dressing c/d/i Wiggles toes, distally NVI Calf soft, nontender  Labs:   Recent Labs  04/26/16 0008 04/26/16 0506 04/27/16 0411 04/28/16 0410  HGB 14.1 13.4 12.8 12.6   Recent Labs  04/27/16 0411 04/28/16 0410  WBC 8.9 8.4  RBC 4.12 4.05  HCT 37.2 36.4  PLT 157 161   Recent Labs  04/27/16 0411 04/28/16 0410  NA 133* 133*  K 4.3 4.5  CL 101 101  CO2 23 24  BUN 10 10  CREATININE 0.77 0.68  GLUCOSE 183* 202*  CALCIUM 8.7* 8.7*    Assessment and Plan: 2 days s/p cannulated hip pinning right hip WBAT norco for pain control only if needed, tylenol otherwise Up with PT/OT today Appreciate medicine team's care D/c to SNF with norco for pain control and ASA 325mg  BID for DVT prophylaxis scripts in chart for d/c when appropriate per primary team Will continue to follow  VTE proph: ASA 325mg  BID, SCDs

## 2016-04-28 NOTE — Clinical Social Work Placement (Signed)
   CLINICAL SOCIAL WORK PLACEMENT  NOTE  Date:  04/28/2016  Patient Details  Name: Melissa Fischer MRN: [id number] Date of Birth: Mar 03, 1934  Clinical Social Work is seeking post-discharge placement for this patient at the [place] level of care (*CSW will initial, date and re-position this form in  chart as items are completed):  Yes   Patient/family provided with [facility] Work Department's list of facilities offering this level of care within the geographic area requested by the patient (or if unable, by the patient's family).  Yes   Patient/family informed of their freedom to choose among providers that offer the needed level of care, that participate in Medicare, Medicaid or managed care program needed by the patient, have an available bed and are willing to accept the patient.  Yes   Patient/family informed of [facility]'s ownership interest in [facility] and [facility], as well as of the fact that they are under no obligation to receive care at these facilities.  PASRR submitted to EDS on 04/27/16     PASRR number received on 04/27/16     Existing PASRR number confirmed on       FL2 transmitted to all facilities in geographic area requested by pt/family on 04/27/16     FL2 transmitted to all facilities within larger geographic area on       Patient informed that his/her managed care company has contracts with or will negotiate with certain facilities, including the following:            Patient/family informed of bed offers received. 04/28/2016   Patient chooses bed at     [facility]  Physician recommends and patient chooses bed at     SNF Patient to be transferred to   on  .  Patient to be transferred to facility by     EMS  Patient family notified on   of transfer.  Name of family member notified:        PHYSICIAN Please prepare priority discharge summary, including medications, Please prepare prescriptions, Please sign  FL2     Additional Comment:  Patient has bed at [facility] when medically stable.  _______________________________________________ Lilly Cove, [name] 04/28/2016, 11:32 AM

## 2016-04-28 NOTE — Progress Notes (Signed)
Physical Therapy Treatment Patient Details Name: Melissa Fischer MRN: [id number] DOB: September 21, 1934 Today's Date: 04/28/2016    History of Present Illness Melissa Fischer is a 82 y.o. female with medical history significant of with history of pacemaker placement for syncope/hypotension, DM, Alzheimers, memory loss,  peripheral neuropathy, and COPD was brought to the ER after patient had a fall as witnessed by patient's daughter with right hip pain and found to have Right impacted femoral neck fracture, now s/p cannulated hip pinning     PT Comments    The patient was very tearful this session. Slowly improving with mobility.  Follow Up Recommendations  SNF     Equipment Recommendations  None recommended by PT    Recommendations for Other Services       Precautions / Restrictions Precautions Precautions: Fall Restrictions RLE Weight Bearing: Weight bearing as tolerated    Mobility  Bed Mobility   Bed Mobility: Sit to Supine       Sit to supine: +2 for physical assistance;+2 for safety/equipment;Total assist   General bed mobility comments: assist with trunk and legs  Transfers Overall transfer level: Needs assistance Equipment used: Rolling walker (2 wheeled) Transfers: Sit to/from [employer] Transfers Sit to Stand: +2 physical assistance;+2 safety/equipment;From elevated surface;Max assist Stand pivot transfers: +2 safety/equipment;+2 physical assistance;Max assist       General transfer comment: multimodal cues for posture, hand position and  assist with the right leg position. very small steps to turn and back up toBSC. Bed brought up after finished on BSC.  Ambulation/Gait                 Stairs            Wheelchair Mobility    Modified Rankin (Stroke Patients Only)       Balance                                    Cognition Arousal/Alertness: Awake/alert Behavior During Therapy: Anxious Overall Cognitive Status: History  of cognitive impairments - at baseline                      Exercises      General Comments        Pertinent Vitals/Pain Pain Assessment: Faces Faces Pain Scale: Hurts whole lot Pain Descriptors / Indicators: Crying;Grimacing;Guarding Pain Intervention(s): Limited activity within patient's tolerance;Monitored during session;Patient requesting pain meds-RN notified    Home Living                      Prior Function            PT Goals (current goals can now be found in the care plan section) Progress towards PT goals: Progressing toward goals    Frequency  Min 3X/week    PT Plan Current plan remains appropriate    Co-evaluation PT/OT/SLP Co-Evaluation/Treatment: Yes           End of Session   Activity Tolerance: Patient limited by pain Patient left: in bed;with call bell/phone within reach;with nursing/sitter in room     Time: 1329-1350 PT Time Calculation (min) (ACUTE ONLY): 21 min  Charges:  $Therapeutic Activity: 8-22 mins                    G Codes:      Marcelino Freestone PT [id number]   04/28/2016,  6:38 PM

## 2016-04-29 DIAGNOSIS — N189 Chronic kidney disease, unspecified: Secondary | ICD-10-CM | POA: Diagnosis not present

## 2016-04-29 DIAGNOSIS — D500 Iron deficiency anemia secondary to blood loss (chronic): Secondary | ICD-10-CM | POA: Diagnosis not present

## 2016-04-29 DIAGNOSIS — M6281 Muscle weakness (generalized): Secondary | ICD-10-CM | POA: Diagnosis not present

## 2016-04-29 DIAGNOSIS — F329 Major depressive disorder, single episode, unspecified: Secondary | ICD-10-CM | POA: Diagnosis not present

## 2016-04-29 DIAGNOSIS — K5900 Constipation, unspecified: Secondary | ICD-10-CM | POA: Diagnosis not present

## 2016-04-29 DIAGNOSIS — R2681 Unsteadiness on feet: Secondary | ICD-10-CM | POA: Diagnosis not present

## 2016-04-29 DIAGNOSIS — G308 Other Alzheimer's disease: Secondary | ICD-10-CM | POA: Diagnosis not present

## 2016-04-29 DIAGNOSIS — E1165 Type 2 diabetes mellitus with hyperglycemia: Secondary | ICD-10-CM | POA: Diagnosis not present

## 2016-04-29 DIAGNOSIS — K5901 Slow transit constipation: Secondary | ICD-10-CM | POA: Diagnosis not present

## 2016-04-29 DIAGNOSIS — Z8679 Personal history of other diseases of the circulatory system: Secondary | ICD-10-CM | POA: Diagnosis not present

## 2016-04-29 DIAGNOSIS — M25552 Pain in left hip: Secondary | ICD-10-CM | POA: Diagnosis not present

## 2016-04-29 DIAGNOSIS — F419 Anxiety disorder, unspecified: Secondary | ICD-10-CM | POA: Diagnosis not present

## 2016-04-29 DIAGNOSIS — J438 Other emphysema: Secondary | ICD-10-CM | POA: Diagnosis not present

## 2016-04-29 DIAGNOSIS — S72011A Unspecified intracapsular fracture of right femur, initial encounter for closed fracture: Secondary | ICD-10-CM | POA: Diagnosis not present

## 2016-04-29 DIAGNOSIS — F0281 Dementia in other diseases classified elsewhere with behavioral disturbance: Secondary | ICD-10-CM | POA: Diagnosis not present

## 2016-04-29 DIAGNOSIS — I10 Essential (primary) hypertension: Secondary | ICD-10-CM | POA: Diagnosis not present

## 2016-04-29 DIAGNOSIS — J449 Chronic obstructive pulmonary disease, unspecified: Secondary | ICD-10-CM | POA: Diagnosis not present

## 2016-04-29 DIAGNOSIS — Z4789 Encounter for other orthopedic aftercare: Secondary | ICD-10-CM | POA: Diagnosis not present

## 2016-04-29 DIAGNOSIS — Z9189 Other specified personal risk factors, not elsewhere classified: Secondary | ICD-10-CM | POA: Diagnosis not present

## 2016-04-29 DIAGNOSIS — S72001S Fracture of unspecified part of neck of right femur, sequela: Secondary | ICD-10-CM | POA: Diagnosis not present

## 2016-04-29 DIAGNOSIS — E119 Type 2 diabetes mellitus without complications: Secondary | ICD-10-CM | POA: Diagnosis not present

## 2016-04-29 DIAGNOSIS — R030 Elevated blood-pressure reading, without diagnosis of hypertension: Secondary | ICD-10-CM | POA: Diagnosis not present

## 2016-04-29 DIAGNOSIS — S72009A Fracture of unspecified part of neck of unspecified femur, initial encounter for closed fracture: Secondary | ICD-10-CM | POA: Diagnosis not present

## 2016-04-29 DIAGNOSIS — S72001K Fracture of unspecified part of neck of right femur, subsequent encounter for closed fracture with nonunion: Secondary | ICD-10-CM | POA: Diagnosis not present

## 2016-04-29 DIAGNOSIS — E871 Hypo-osmolality and hyponatremia: Secondary | ICD-10-CM | POA: Diagnosis not present

## 2016-04-29 DIAGNOSIS — S72091D Other fracture of head and neck of right femur, subsequent encounter for closed fracture with routine healing: Secondary | ICD-10-CM | POA: Diagnosis not present

## 2016-04-29 DIAGNOSIS — W19XXXA Unspecified fall, initial encounter: Secondary | ICD-10-CM | POA: Diagnosis not present

## 2016-04-29 LAB — CBC
HCT: 35.1 % — ABNORMAL LOW (ref 36.0–46.0)
Hemoglobin: 12.4 g/dL (ref 12.0–15.0)
MCH: 30.7 pg (ref 26.0–34.0)
MCHC: 35.3 g/dL (ref 30.0–36.0)
MCV: 86.9 fL (ref 78.0–100.0)
PLATELETS: 184 10*3/uL (ref 150–400)
RBC: 4.04 MIL/uL (ref 3.87–5.11)
RDW: 12.2 % (ref 11.5–15.5)
WBC: 7.1 10*3/uL (ref 4.0–10.5)

## 2016-04-29 LAB — GLUCOSE, CAPILLARY
GLUCOSE-CAPILLARY: 212 mg/dL — AB (ref 65–99)
GLUCOSE-CAPILLARY: 153 mg/dL — AB (ref 65–99)
Glucose-Capillary: 195 mg/dL — ABNORMAL HIGH (ref 65–99)

## 2016-04-29 LAB — CBC AND DIFFERENTIAL: WBC: 7.1 10^3/mL

## 2016-04-29 NOTE — Discharge Summary (Signed)
Physician Discharge Summary  Melissa Fischer [id number] DOB: 1934-01-25 DOA: 04/25/2016  PCP: Mathews Argyle, MD  Admit date: 04/25/2016 Discharge date: 04/28/2016  Recommendations for Outpatient Follow-up:  1. Pt will need to follow up with PCP in 2 weeks post discharge 2. Please obtain BMP and CBC in one week   Discharge Diagnoses:  Right subcapital femoral neck fracture -04/26/2016--cannulated titanium screws x3--Dr. Tamera Punt -control pain -PT/OT-->SNF -ASA 325 mg bid for DVT prophylaxis x 21 days -appreciate ortho  Diabetes mellitus type 2 -Hemoglobin A1c-8.2 -Discontinue metformin while inpatient--restart after d/c -NovoLog sliding scale during the hospitalization -encouraged followup with PCP after d/c from SNF rehab  History of syncope, status post PPM -follows Dr. Olin Pia -PPM last interrogated prior to admission on 01/30/2016  COPD -Stable presently  -Resume Dulera post-op  Alzheimer's dementia -[facility], Dr. Jaynee Eagles -could not tolerate Namenda, Aricept, Exelon in past -ativan prn agitation--she had some sundowning during the admission  Discharge Condition: stable  Disposition: SNF Follow-up Information    Follow up with [name], MD. Schedule an appointment as soon as possible for a visit in 2 weeks.   Specialty:  Orthopedic Surgery   Contact information:   [place] [address] [phone]       Diet:carb modified Wt Readings from Last 3 Encounters:  04/26/16 69.8 kg (153 lb 14.1 oz)  03/31/16 70.217 kg (154 lb 12.8 oz)  01/30/16 71.396 kg (157 lb 6.4 oz)    History of present illness:  82 year old female with a history of diabetes mellitus, syncope requiring permanent pacemaker, COPD, Alzheimer's dementia, hyperlipidemia presented after a mechanical fall getting out of the car. The patient and her head and complained of right hip pain. She presented to the emergency department where  x-ray and CT of the right hip revealed right subcapital femoral neck fracture. Orthopedics, Dr. Tamera Punt, was consulted, and surgery is planned on 04/26/2016. The patient denied any chest discomfort, shortness of breath, dizziness, syncopal. From a functional standpoint, the patient is normally able to perform her ADLs without any difficulty. She is able to walk around the block without chest discomfort or shortness of breath. There is no prior history of stroke or MI. The patient underwent hip pinning on 04/26/2016. Physical therapy was consulted and recommended skilled nursing facility. The patient's opioids were adjusted. She remained afebrile and hemodynamically stable.  Consultants: Ortho--[place]  Discharge Exam: Filed Vitals:   04/29/16 0639 04/29/16 0825  BP: 168/81   Pulse: 92 75  Temp: 98.7 F (37.1 C)   Resp: 18 16   Filed Vitals:   04/28/16 2003 04/28/16 2005 04/29/16 0639 04/29/16 0825  BP:  140/79 168/81   Pulse:  93 92 75  Temp:  98.8 F (37.1 C) 98.7 F (37.1 C)   TempSrc:  Oral Oral   Resp:  16 18 16   Height:      Weight:      SpO2: 99% 99% 99% 97%   General: Alert and awake, NAD, pleasant, cooperative Cardiovascular: RRR, no rub, no gallop, no S3 Respiratory: CTA. No wheezing. Abdomen:soft, nontender, nondistended, positive bowel sounds Extremities: trace edema, No lymphangitis, no petechiae  Discharge Instructions      Discharge Instructions    Diet - low sodium heart healthy    Complete by:  As directed      Increase activity slowly    Complete by:  As directed      Weight bearing as tolerated    Complete by:  As  directed             Medication List    STOP taking these medications        ADVIL 200 MG tablet  Generic drug:  ibuprofen      TAKE these medications        aspirin EC 325 MG tablet  Take 1 tablet (325 mg total) by mouth 2 (two) times daily.     DULERA 200-5 MCG/ACT Aero  Generic drug:  mometasone-formoterol  Inhale 2  puffs into the lungs 2 (two) times daily.     feeding supplement (GLUCERNA SHAKE) Liqd  Take 237 mLs by mouth 3 (three) times daily between meals.     fexofenadine 180 MG tablet  Commonly known as:  ALLEGRA  Take 180 mg by mouth daily.     HYDROcodone-acetaminophen 5-325 MG tablet  Commonly known as:  NORCO/VICODIN  Take 1-2 tablets by mouth every 6 (six) hours as needed for moderate pain.     metFORMIN 500 MG 24 hr tablet  Commonly known as:  GLUCOPHAGE-XR  Take 500 mg by mouth daily.         The results of significant diagnostics from this hospitalization (including imaging, microbiology, ancillary and laboratory) are listed below for reference.    Significant Diagnostic Studies: Dg Chest 1 View  04/26/2016  CLINICAL DATA:  Preop exam for hip fracture EXAM: CHEST 1 VIEW COMPARISON:  None. FINDINGS: Normal heart size and mediastinal contours. Dual-chamber pacer from the left. No acute infiltrate or edema. No effusion or pneumothorax. No acute osseous findings. IMPRESSION: No evidence of active disease or thoracic injury. Electronically Signed   By: Monte Fantasia M.D.   On: 04/26/2016 00:55   Ct Head Wo Contrast  04/25/2016  CLINICAL DATA:  82 year old female with history of trauma from a fall while getting out of a car earlier this evening. Hematoma on the right side of the forehead. EXAM: CT HEAD WITHOUT CONTRAST TECHNIQUE: Contiguous axial images were obtained from the base of the skull through the vertex without intravenous contrast. COMPARISON:  No priors. FINDINGS: Soft tissue swelling in the right frontal scalp laterally where there is some high attenuation, compatible with a small scalp hematoma. Mild cerebral atrophy. Patchy and confluent areas of decreased attenuation are noted throughout the deep and periventricular white matter of the cerebral hemispheres bilaterally, compatible with chronic microvascular ischemic disease. No acute displaced skull fractures are identified.  No acute intracranial abnormality. Specifically, no evidence of acute post-traumatic intracranial hemorrhage, no definite regions of acute/subacute cerebral ischemia, no focal mass, mass effect, hydrocephalus or abnormal intra or extra-axial fluid collections. The visualized paranasal sinuses and mastoids are well pneumatized. IMPRESSION: 1. Small right lateral frontal scalp hematoma. 2. No acute displaced skull fractures or evidence of significant acute traumatic injury to the brain. 3. Mild cerebral atrophy with extensive chronic microvascular ischemic changes in the cerebral white matter. Electronically Signed   By: Vinnie Langton M.D.   On: 04/25/2016 23:55   Ct Pelvis Wo Contrast  04/26/2016  CLINICAL DATA:  Fall with right hip and pelvic pain. Initial encounter. EXAM: CT PELVIS WITHOUT CONTRAST TECHNIQUE: Multidetector CT imaging of the pelvis was performed following the standard protocol without intravenous contrast. Dedicated reformats of the pelvis and right hip were obtained. COMPARISON:  Radiography from late yesterday FINDINGS: Re- demonstrated subcapital right femoral neck fracture with impaction laterally. No involvement along the articular surface of the femoral head. There is no pelvic fracture. Degenerative spurring to the  right acetabulum is mild. No hip joint narrowing. No myotendinous disruption. Osteopenia. No posttraumatic finding to the visceral pelvis. Lower lumbar disc and facet degeneration. IMPRESSION: Subcapital right femoral neck fracture with lateral impaction. Electronically Signed   By: Monte Fantasia M.D.   On: 04/26/2016 00:53   Pelvis Portable  04/26/2016  CLINICAL DATA:  Status post right hip pinning. EXAM: PORTABLE PELVIS 1-2 VIEWS COMPARISON:  CT pelvis dated 04/26/2016. FINDINGS: Interval placement of 3 fixation screws which traverse the right femoral neck fracture site. The screws appear intact and appropriately positioned. Osseous alignment is anatomic. Expected  postsurgical changes are seen within the surrounding soft tissues. No evidence of surgical complicating feature. IMPRESSION: Status post placement of 3 fixation screws which traverse the right femoral neck fracture site. Osseous alignment is anatomic. No evidence of surgical complicating feature. Electronically Signed   By: Franki Cabot M.D.   On: 04/26/2016 14:58   Ct Hip Right Wo Contrast  04/26/2016  CLINICAL DATA:  Fall with right hip and pelvic pain. Initial encounter. EXAM: CT PELVIS WITHOUT CONTRAST TECHNIQUE: Multidetector CT imaging of the pelvis was performed following the standard protocol without intravenous contrast. Dedicated reformats of the pelvis and right hip were obtained. COMPARISON:  Radiography from late yesterday FINDINGS: Re- demonstrated subcapital right femoral neck fracture with impaction laterally. No involvement along the articular surface of the femoral head. There is no pelvic fracture. Degenerative spurring to the right acetabulum is mild. No hip joint narrowing. No myotendinous disruption. Osteopenia. No posttraumatic finding to the visceral pelvis. Lower lumbar disc and facet degeneration. IMPRESSION: Subcapital right femoral neck fracture with lateral impaction. Electronically Signed   By: Monte Fantasia M.D.   On: 04/26/2016 00:53   Dg Hip Operative Unilat With Pelvis Right  04/26/2016  CLINICAL DATA:  ORIF right hip fracture EXAM: OPERATIVE RIGHT HIP (WITH PELVIS IF PERFORMED) 2 VIEWS TECHNIQUE: Fluoroscopic spot image(s) were submitted for interpretation post-operatively. COMPARISON:  04/25/2016 FINDINGS: Three pins through right femoral neck fracture. No significant displacement. Radiation Exposure Index (as provided by the fluoroscopic device): 7.18 mGy IMPRESSION: ORIF right femoral neck fracture Electronically Signed   By: Skipper Cliche M.D.   On: 04/26/2016 14:11   Dg Hip Unilat With Pelvis 2-3 Views Right  04/25/2016  CLINICAL DATA:  Fall with hip pain on the  right.  Initial encounter. EXAM: DG HIP (WITH OR WITHOUT PELVIS) 2-3V RIGHT COMPARISON:  None. FINDINGS: Right femoral neck fracture with lateral impaction. No notable degenerative changes to the acetabula. No evidence of pelvic ring fracture or diastasis. IMPRESSION: Mildly impacted subcapital right femoral neck fracture. Electronically Signed   By: Monte Fantasia M.D.   On: 04/25/2016 23:44     Microbiology: Recent Results (from the past 240 hour(s))  Surgical pcr screen     Status: None   Collection Time: 04/26/16  6:45 AM  Result Value Ref Range Status   MRSA, PCR NEGATIVE NEGATIVE Final   Staphylococcus aureus NEGATIVE NEGATIVE Final    Comment:        The Xpert SA Assay (FDA approved for NASAL specimens in patients over 75 years of age), is one component of a comprehensive surveillance program.  Test performance has been validated by [facility] for patients greater than or equal to 85 year old. It is not intended to diagnose infection nor to guide or monitor treatment.      Labs: Basic Metabolic Panel:  Recent Labs Lab 04/26/16 0008 04/26/16 [id number] 04/27/16 0411 04/28/16 0410  NA 134* 135 133* 133*  K 3.8 4.2 4.3 4.5  CL 100* 103 101 101  CO2 21* 21* 23 24  GLUCOSE 217* 191* 183* 202*  BUN 14 13 10 10   CREATININE 0.79 0.74 0.77 0.68  CALCIUM 9.7 9.1 8.7* 8.7*   Liver Function Tests:  Recent Labs Lab 04/26/16 0506  AST 23  ALT 19  ALKPHOS 58  BILITOT 0.9  PROT 6.4*  ALBUMIN 4.0   No results for input(s): LIPASE, AMYLASE in the last 168 hours. No results for input(s): AMMONIA in the last 168 hours. CBC:  Recent Labs Lab 04/26/16 0008 04/26/16 0506 04/27/16 0411 04/28/16 0410 04/29/16 0818  WBC 9.5 10.4 8.9 8.4 7.1  NEUTROABS 5.8 7.6  --   --   --   HGB 14.1 13.4 12.8 12.6 12.4  HCT 40.8 38.8 37.2 36.4 35.1*  MCV 89.5 89.2 90.3 89.9 86.9  PLT 218 193 157 161 184   Cardiac Enzymes: No results for input(s): CKTOTAL, CKMB, CKMBINDEX,  TROPONINI in the last 168 hours. BNP: Invalid input(s): POCBNP CBG:  Recent Labs Lab 04/28/16 0756 04/28/16 1156 04/28/16 1646 04/28/16 2211 04/29/16 0751  GLUCAP 159* 233* 194* 212* 153*    Time coordinating discharge:  Greater than 30 minutes  Signed:  Sascha Palma, DO [facility] Pager: [phone] 04/29/2016, 9:31 AM

## 2016-04-29 NOTE — Progress Notes (Addendum)
Patient was scheduled for discharge today to SNF: [facility].  [name] was made aware of patient "fall" last night and order for sitter placed with patient. Barrier to DC: sitter must be discharged for 24 hours.   [name] has called SNF to verify policy and holding discharge for sitter.  Ivin Booty reports this is accurate and will need to come on 5/3.  Family at the bedside, daughter and very angry for delay of discharge. [name] attempted to do service recovery with daughter and explain reason for delay and SNF policy. Daughter reports she has spoken with Rollene Fare at [facility] and verifying if patient has to wait.  Family requesting private duty sitters list for assistance at facility with patient being confused and high fall risk with sundowners and being in a new environment.   [name] waiting to hear if patient can still discharge.    Facility reports that patient is able to go to SNF even with with sitter.   All clinicals sent to facility for review. Daughter at bedside, verifies EMS for transport.   Lane Hacker, MSW Clinical Social Work: [facility] [employer] [phone]

## 2016-04-29 NOTE — Progress Notes (Signed)
Sitter d/c'd at this time.

## 2016-04-29 NOTE — Progress Notes (Signed)
Patient confused and not able to follow simple commands instructed to sit on the bed after toileting, patient walks forward utulizing front wheel walker & states " i cant do this " & while holding on to walker kneels down to the floor knee by knee with this nurse assistance. This nurse & 2x person assisted resident back onto her feet, patient assisted as well. No c/o pain or dizziness at this time  [facility] informed. MD paged. Daughter [name] notified @[phone] -[place] completed. Will continue to monitor.Marland Kitchen

## 2016-04-29 NOTE — Progress Notes (Signed)
OT Cancellation Note  Patient Details Name: Melissa Fischer MRN: [id number] DOB: Jun 24, 1934   Cancelled Treatment:    Reason Eval/Treat Not Completed: Other (comment).  Per notes, pt confused and not following simple commands. Will check back another day.  Keeshia Sanderlin 04/29/2016, 8:04 AM  Lesle Chris, OTR/L [phone] 04/29/2016

## 2016-04-29 NOTE — Care Management Important Message (Signed)
Important Message  Patient Details  Name: Melissa Fischer MRN: [id number] Date of Birth: [birth year]   Medicare Important Message Given:  Yes    Camillo Flaming 04/29/2016, 10:10 AMImportant Message  Patient Details  Name: Melissa Fischer MRN: [id number] Date of Birth: 06/02/1934   Medicare Important Message Given:  Yes    Camillo Flaming 04/29/2016, 10:10 AM

## 2016-04-29 NOTE — Progress Notes (Signed)
Patient discharged to [facility], report given to Maudie Mercury, RN.  Patient discharged with [facility]

## 2016-04-30 ENCOUNTER — Encounter: Payer: Self-pay | Admitting: Adult Health

## 2016-04-30 ENCOUNTER — Encounter: Payer: Medicare Other | Admitting: *Deleted

## 2016-04-30 ENCOUNTER — Non-Acute Institutional Stay (SKILLED_NURSING_FACILITY): Payer: Medicare Other | Admitting: Adult Health

## 2016-04-30 DIAGNOSIS — K5901 Slow transit constipation: Secondary | ICD-10-CM

## 2016-04-30 DIAGNOSIS — F02818 Dementia in other diseases classified elsewhere, unspecified severity, with other behavioral disturbance: Secondary | ICD-10-CM

## 2016-04-30 DIAGNOSIS — R2681 Unsteadiness on feet: Secondary | ICD-10-CM

## 2016-04-30 DIAGNOSIS — Z9189 Other specified personal risk factors, not elsewhere classified: Secondary | ICD-10-CM

## 2016-04-30 DIAGNOSIS — G309 Alzheimer's disease, unspecified: Secondary | ICD-10-CM

## 2016-04-30 DIAGNOSIS — F0281 Dementia in other diseases classified elsewhere with behavioral disturbance: Secondary | ICD-10-CM

## 2016-04-30 DIAGNOSIS — E871 Hypo-osmolality and hyponatremia: Secondary | ICD-10-CM

## 2016-04-30 DIAGNOSIS — J438 Other emphysema: Secondary | ICD-10-CM

## 2016-04-30 DIAGNOSIS — G308 Other Alzheimer's disease: Secondary | ICD-10-CM | POA: Diagnosis not present

## 2016-04-30 DIAGNOSIS — E1165 Type 2 diabetes mellitus with hyperglycemia: Secondary | ICD-10-CM | POA: Diagnosis not present

## 2016-04-30 DIAGNOSIS — Z87898 Personal history of other specified conditions: Secondary | ICD-10-CM

## 2016-04-30 DIAGNOSIS — S72001S Fracture of unspecified part of neck of right femur, sequela: Secondary | ICD-10-CM | POA: Diagnosis not present

## 2016-04-30 NOTE — Progress Notes (Signed)
Patient ID: Melissa Fischer, female   DOB: Jul 17, 1934, 82 y.o.   MRN: [id number]    DATE:  04/30/2016   MRN:  [id number]  BIRTHDAY: 02-24-1934  Facility:  Nursing Home Location:  [facility] Room Number: 1202-P  LEVEL OF CARE:  SNF (31)  Contact Information    Name Relation Home Work [place] Daughter   [phone]       Code Status History    Date Active Date Inactive Code Status Order ID Comments User Context   04/26/2016  4:35 AM 04/29/2016  4:47 PM Full Code [id number]  Rise Patience, MD Inpatient       Chief Complaint  Patient presents with  . Hospitalization Follow-up    HISTORY OF PRESENT ILLNESS: This is an 82 year old female who has been admitted to [facility] on 04/29/16 from [facility]. She has PMH of pacemaker placement for syncope/hypotension, diabetes mellitus and COPD. Patient was trying to get out of the car after the church service when she had a fall and sustained a right subcapital femoral neck fracture for which she had cannulated titanium screws 3 on 04/26/16. She has been admitted for a short-term rehabilitation.   PAST MEDICAL HISTORY:  Past Medical History  Diagnosis Date  . Diabetes ([place])   . Depression   . Memory loss   . Bronchitis   . Hypercholesteremia   . Peripheral neuropathy ([place])   . COPD (chronic obstructive pulmonary disease) ([place])   . Renal disease   . Shoulder pain, left   . Alzheimer disease   . Syncope   . Pacemaker     [employer] DOI 2011     CURRENT MEDICATIONS: Reviewed  Patient's Medications  New Prescriptions   No medications on file  Previous Medications   ASPIRIN EC 325 MG TABLET    Take 1 tablet (325 mg total) by mouth 2 (two) times daily.   FEXOFENADINE (ALLEGRA) 180 MG TABLET    Take 180 mg by mouth daily.   HYDROCODONE-ACETAMINOPHEN (NORCO/VICODIN) 5-325 MG TABLET    Take 1-2 tablets by mouth every 6 (six) hours as needed for moderate pain.   METFORMIN  (GLUCOPHAGE-XR) 500 MG 24 HR TABLET    Take 500 mg by mouth daily.   MOMETASONE-FORMOTEROL (DULERA) 200-5 MCG/ACT AERO    Inhale 2 puffs into the lungs 2 (two) times daily.   UNABLE TO FIND    Med Name: SF MedPass 120 mL TID  Modified Medications   No medications on file  Discontinued Medications   FEEDING SUPPLEMENT, GLUCERNA SHAKE, (GLUCERNA SHAKE) LIQD    Take 237 mLs by mouth 3 (three) times daily between meals.     Allergies  Allergen Reactions  . Aricept [Donepezil]     Leg cramps  . Namenda [Memantine]     dizziness  . Penicillins       . Statins Other (See Comments)    unknown  . Sulfa Antibiotics      REVIEW OF SYSTEMS:  GENERAL: no change in appetite, no fatigue, no weight changes, no fever, chills or weakness EYES: Denies change in vision, dry eyes, eye pain, itching or discharge EARS: Denies change in hearing, ringing in ears, or earache NOSE: Denies nasal congestion or epistaxis MOUTH and THROAT: Denies oral discomfort, gingival pain or bleeding, pain from teeth or hoarseness   RESPIRATORY: no cough, SOB, DOE, wheezing, hemoptysis CARDIAC: no chest pain, edema or palpitations GI: no abdominal pain, diarrhea,  constipation, heart burn, nausea or vomiting GU: Denies dysuria, frequency, hematuria, incontinence, or discharge PSYCHIATRIC: Denies feeling of depression or anxiety. No report of hallucinations, insomnia, paranoia, or agitation   PHYSICAL EXAMINATION  GENERAL APPEARANCE: Well nourished. In no acute distress. Normal body habitus SKIN:  Right femoral surgical incision with 4 staples intact, dry, no erythema  HEAD: Normal in size and contour. No evidence of trauma EYES: Lids open and close normally. No blepharitis, entropion or ectropion. PERRL. Conjunctivae are clear and sclerae are white. Lenses are without opacity EARS: Pinnae are normal. Patient hears normal voice tunes of the examiner MOUTH and THROAT: Lips are without lesions. Oral mucosa is moist  and without lesions. Tongue is normal in shape, size, and color and without lesions NECK: supple, trachea midline, no neck masses, no thyroid tenderness, no thyromegaly LYMPHATICS: no LAN in the neck, no supraclavicular LAN RESPIRATORY: breathing is even & unlabored, BS CTAB CARDIAC: RRR, no murmur,no extra heart sounds, no edema; pacemaker on left chest GI: abdomen soft, normal BS, no masses, no tenderness, no hepatomegaly, no splenomegaly EXTREMITIES:  Able to move 4 extremities PSYCHIATRIC: Alert and oriented X 3. Affect and behavior are appropriate  LABS/RADIOLOGY: Labs reviewed: Basic Metabolic Panel:  Recent Labs  04/26/16 0506 04/27/16 0411 04/28/16 0410  NA 135 133* 133*  K 4.2 4.3 4.5  CL 103 101 101  CO2 21* 23 24  GLUCOSE 191* 183* 202*  BUN 13 10 10   CREATININE 0.74 0.77 0.68  CALCIUM 9.1 8.7* 8.7*   Liver Function Tests:  Recent Labs  04/26/16 0506  AST 23  ALT 19  ALKPHOS 58  BILITOT 0.9  PROT 6.4*  ALBUMIN 4.0   CBC:  Recent Labs  04/26/16 0008 04/26/16 0506 04/27/16 0411 04/28/16 0410 04/29/16 0818  WBC 9.5 10.4 8.9 8.4 7.1  NEUTROABS 5.8 7.6  --   --   --   HGB 14.1 13.4 12.8 12.6 12.4  HCT 40.8 38.8 37.2 36.4 35.1*  MCV 89.5 89.2 90.3 89.9 86.9  PLT 218 193 157 161 184    CBG:  Recent Labs  04/28/16 2211 04/29/16 0751 04/29/16 1205  GLUCAP 212* 153* 195*      Dg Chest 1 View  04/26/2016  CLINICAL DATA:  Preop exam for hip fracture EXAM: CHEST 1 VIEW COMPARISON:  None. FINDINGS: Normal heart size and mediastinal contours. Dual-chamber pacer from the left. No acute infiltrate or edema. No effusion or pneumothorax. No acute osseous findings. IMPRESSION: No evidence of active disease or thoracic injury. Electronically Signed   By: Monte Fantasia M.D.   On: 04/26/2016 00:55   Ct Head Wo Contrast  04/25/2016  CLINICAL DATA:  82 year old female with history of trauma from a fall while getting out of a car earlier this evening.  Hematoma on the right side of the forehead. EXAM: CT HEAD WITHOUT CONTRAST TECHNIQUE: Contiguous axial images were obtained from the base of the skull through the vertex without intravenous contrast. COMPARISON:  No priors. FINDINGS: Soft tissue swelling in the right frontal scalp laterally where there is some high attenuation, compatible with a small scalp hematoma. Mild cerebral atrophy. Patchy and confluent areas of decreased attenuation are noted throughout the deep and periventricular white matter of the cerebral hemispheres bilaterally, compatible with chronic microvascular ischemic disease. No acute displaced skull fractures are identified. No acute intracranial abnormality. Specifically, no evidence of acute post-traumatic intracranial hemorrhage, no definite regions of acute/subacute cerebral ischemia, no focal mass, mass effect, hydrocephalus or abnormal  intra or extra-axial fluid collections. The visualized paranasal sinuses and mastoids are well pneumatized. IMPRESSION: 1. Small right lateral frontal scalp hematoma. 2. No acute displaced skull fractures or evidence of significant acute traumatic injury to the brain. 3. Mild cerebral atrophy with extensive chronic microvascular ischemic changes in the cerebral white matter. Electronically Signed   By: Vinnie Langton M.D.   On: 04/25/2016 23:55   Ct Pelvis Wo Contrast  04/26/2016  CLINICAL DATA:  Fall with right hip and pelvic pain. Initial encounter. EXAM: CT PELVIS WITHOUT CONTRAST TECHNIQUE: Multidetector CT imaging of the pelvis was performed following the standard protocol without intravenous contrast. Dedicated reformats of the pelvis and right hip were obtained. COMPARISON:  Radiography from late yesterday FINDINGS: Re- demonstrated subcapital right femoral neck fracture with impaction laterally. No involvement along the articular surface of the femoral head. There is no pelvic fracture. Degenerative spurring to the right acetabulum is mild. No  hip joint narrowing. No myotendinous disruption. Osteopenia. No posttraumatic finding to the visceral pelvis. Lower lumbar disc and facet degeneration. IMPRESSION: Subcapital right femoral neck fracture with lateral impaction. Electronically Signed   By: Monte Fantasia M.D.   On: 04/26/2016 00:53   Pelvis Portable  04/26/2016  CLINICAL DATA:  Status post right hip pinning. EXAM: PORTABLE PELVIS 1-2 VIEWS COMPARISON:  CT pelvis dated 04/26/2016. FINDINGS: Interval placement of 3 fixation screws which traverse the right femoral neck fracture site. The screws appear intact and appropriately positioned. Osseous alignment is anatomic. Expected postsurgical changes are seen within the surrounding soft tissues. No evidence of surgical complicating feature. IMPRESSION: Status post placement of 3 fixation screws which traverse the right femoral neck fracture site. Osseous alignment is anatomic. No evidence of surgical complicating feature. Electronically Signed   By: Franki Cabot M.D.   On: 04/26/2016 14:58   Ct Hip Right Wo Contrast  04/26/2016  CLINICAL DATA:  Fall with right hip and pelvic pain. Initial encounter. EXAM: CT PELVIS WITHOUT CONTRAST TECHNIQUE: Multidetector CT imaging of the pelvis was performed following the standard protocol without intravenous contrast. Dedicated reformats of the pelvis and right hip were obtained. COMPARISON:  Radiography from late yesterday FINDINGS: Re- demonstrated subcapital right femoral neck fracture with impaction laterally. No involvement along the articular surface of the femoral head. There is no pelvic fracture. Degenerative spurring to the right acetabulum is mild. No hip joint narrowing. No myotendinous disruption. Osteopenia. No posttraumatic finding to the visceral pelvis. Lower lumbar disc and facet degeneration. IMPRESSION: Subcapital right femoral neck fracture with lateral impaction. Electronically Signed   By: Monte Fantasia M.D.   On: 04/26/2016 00:53   Dg  Hip Operative Unilat With Pelvis Right  04/26/2016  CLINICAL DATA:  ORIF right hip fracture EXAM: OPERATIVE RIGHT HIP (WITH PELVIS IF PERFORMED) 2 VIEWS TECHNIQUE: Fluoroscopic spot image(s) were submitted for interpretation post-operatively. COMPARISON:  04/25/2016 FINDINGS: Three pins through right femoral neck fracture. No significant displacement. Radiation Exposure Index (as provided by the fluoroscopic device): 7.18 mGy IMPRESSION: ORIF right femoral neck fracture Electronically Signed   By: Skipper Cliche M.D.   On: 04/26/2016 14:11   Dg Hip Unilat With Pelvis 2-3 Views Right  04/25/2016  CLINICAL DATA:  Fall with hip pain on the right.  Initial encounter. EXAM: DG HIP (WITH OR WITHOUT PELVIS) 2-3V RIGHT COMPARISON:  None. FINDINGS: Right femoral neck fracture with lateral impaction. No notable degenerative changes to the acetabula. No evidence of pelvic ring fracture or diastasis. IMPRESSION: Mildly impacted subcapital right femoral  neck fracture. Electronically Signed   By: Monte Fantasia M.D.   On: 04/25/2016 23:44    ASSESSMENT/PLAN:  Unsteady gait - for rehabilitation  Right femoral neck fracture S/P cannulated hip pinning - for rehabilitation; continue Norco 5/325 mg 1-2 tabs by mouth every 6 hours when necessary for pain; aspirin 325 mg 1 tab by mouth twice a day for 21 daysDVT prophylaxis  ; follow-up with Dr. Tamera Punt, orthopedic surgeon, in 2 weeks; check CBC  Diabetes mellitus, type II - hemoglobin A1c 8.2; continue Glucophage 500 mg 1 tab by mouth daily  COPD - no SOB; continue Dulera 200 mcg/5 mcg inhaler inhale 2 puffs into the lungs twice a day  Alzheimer's dementia - could not tolerate Namenda, Aricept and Exelon in the past; had an episode of agitation last night; psychiatric consultation  History of syncope S/P PPM - follows-up with Dr. Caryl Comes, cardiology  Hyponatremia - NA 133; check BMP  Constipation - continue senna S2 tabs by mouth daily when necessary and  MiraLAX 17 g by mouth daily when necessary     Goals of care:  Short-term rehabilitation    [facility], [place] [phone]

## 2016-05-01 LAB — CBC AND DIFFERENTIAL
HCT: 36 % (ref 36–46)
Hemoglobin: 12.1 g/dL (ref 12.0–16.0)
Platelets: 224 10*3/uL (ref 150–399)
WBC: 8.5 10^3/mL

## 2016-05-01 LAB — BASIC METABOLIC PANEL
BUN: 13 mg/dL (ref 4–21)
CREATININE: 0.7 mg/dL (ref 0.5–1.1)
Glucose: 152 mg/dL
POTASSIUM: 4.2 mmol/L (ref 3.4–5.3)
Sodium: 134 mmol/L — AB (ref 137–147)

## 2016-05-02 ENCOUNTER — Non-Acute Institutional Stay (SKILLED_NURSING_FACILITY): Payer: Medicare Other | Admitting: Internal Medicine

## 2016-05-02 ENCOUNTER — Encounter: Payer: Self-pay | Admitting: Internal Medicine

## 2016-05-02 ENCOUNTER — Encounter: Payer: Self-pay | Admitting: Cardiology

## 2016-05-02 DIAGNOSIS — F329 Major depressive disorder, single episode, unspecified: Secondary | ICD-10-CM

## 2016-05-02 DIAGNOSIS — E871 Hypo-osmolality and hyponatremia: Secondary | ICD-10-CM | POA: Diagnosis not present

## 2016-05-02 DIAGNOSIS — R030 Elevated blood-pressure reading, without diagnosis of hypertension: Secondary | ICD-10-CM | POA: Diagnosis not present

## 2016-05-02 DIAGNOSIS — G308 Other Alzheimer's disease: Secondary | ICD-10-CM

## 2016-05-02 DIAGNOSIS — Z8679 Personal history of other diseases of the circulatory system: Secondary | ICD-10-CM

## 2016-05-02 DIAGNOSIS — K5900 Constipation, unspecified: Secondary | ICD-10-CM

## 2016-05-02 DIAGNOSIS — S72001S Fracture of unspecified part of neck of right femur, sequela: Secondary | ICD-10-CM | POA: Diagnosis not present

## 2016-05-02 DIAGNOSIS — F0281 Dementia in other diseases classified elsewhere with behavioral disturbance: Secondary | ICD-10-CM

## 2016-05-02 DIAGNOSIS — E1165 Type 2 diabetes mellitus with hyperglycemia: Secondary | ICD-10-CM

## 2016-05-02 DIAGNOSIS — J438 Other emphysema: Secondary | ICD-10-CM

## 2016-05-02 DIAGNOSIS — F32A Depression, unspecified: Secondary | ICD-10-CM

## 2016-05-02 DIAGNOSIS — IMO0001 Reserved for inherently not codable concepts without codable children: Secondary | ICD-10-CM

## 2016-05-02 DIAGNOSIS — R2681 Unsteadiness on feet: Secondary | ICD-10-CM | POA: Diagnosis not present

## 2016-05-02 DIAGNOSIS — G309 Alzheimer's disease, unspecified: Secondary | ICD-10-CM

## 2016-05-02 NOTE — Progress Notes (Signed)
LOCATION: [facility]  PCP: Mathews Argyle, MD   Code Status: Full Code  Goals of care: Advanced Directive information Advanced Directives 04/25/2016  Does patient have an advance directive? No  Would patient like information on creating an advanced directive? No - patient declined information       Extended Emergency Contact Information Primary Emergency Contact: Roslynn Amble [place] Mobile Phone: [phone] Relation: Daughter  Allergies- reviewed  Chief Complaint  Patient presents with  . New Admit To SNF    New Admission     HPI:  Patient is a 82 y.o. female seen today for short term rehabilitation post hospital admission from 04/25/16-04/28/16 with right subcapital femoral neck fracture. She underwent surgical repairwith screw fixation on 04/26/16. She is seen in her room today. She is pleasantly confused and has a caregiver at bedside.   Review of Systems:  Constitutional: Negative for fever, chills. Feels weak and tired.  HENT: Negative for headache, congestion, nasal discharge, sore throat, difficulty swallowing. Has Post nasal drip.  Eyes: Negative for blurred vision, double vision and discharge.  Respiratory: Negative for cough, shortness of breath and wheezing.   Cardiovascular: Negative for chest pain, palpitations, leg swelling.  Gastrointestinal: Negative for heartburn, nausea, vomiting, abdominal pain. Last bowel movement was today. Genitourinary: Negative for dysuria and flank pain.  Musculoskeletal: Negative for back pain, fall in the facility.  Skin: Negative for itching, rash.  Neurological: Negative for weakness. Positive for dizziness with change of position. Psychiatric/Behavioral: Negative for depression   Past Medical History  Diagnosis Date  . Diabetes ([place])   . Depression   . Memory loss   . Bronchitis   . Hypercholesteremia   . Peripheral neuropathy ([place])   . COPD (chronic obstructive pulmonary disease) ([place])    . Renal disease   . Shoulder pain, left   . Alzheimer disease   . Syncope   . Pacemaker     [employer] DOI 2011   Past Surgical History  Procedure Laterality Date  . No surgical history    . Pacemaker placement    . Hip pinning,cannulated Right 04/26/2016    Procedure: CANNULATED HIP PINNING;  Surgeon: Tania Ade, MD;  Location: [facility];  Service: Orthopedics;  Laterality: Right;   Social History:   reports that she quit smoking about 41 years ago. She does not have any smokeless tobacco history on file. She reports that she drinks alcohol. She reports that she does not use illicit drugs.  Family History  Problem Relation Age of Onset  . Diabetes Mother   . Dementia Neg Hx     Medications:   Medication List       This list is accurate as of: 05/02/16  2:20 PM.  Always use your most recent med list.               aspirin EC 325 MG tablet  Take 1 tablet (325 mg total) by mouth 2 (two) times daily.     budesonide-formoterol 160-4.5 MCG/ACT inhaler  Commonly known as:  SYMBICORT  Inhale 2 puffs into the lungs 2 (two) times daily.     fexofenadine 180 MG tablet  Commonly known as:  ALLEGRA  Take 180 mg by mouth daily.     HYDROcodone-acetaminophen 5-325 MG tablet  Commonly known as:  NORCO/VICODIN  Take 1-2 tablets by mouth every 6 (six) hours as needed for moderate pain.     metFORMIN 500 MG 24 hr tablet  Commonly known  as:  GLUCOPHAGE-XR  Take 500 mg by mouth daily.     mirtazapine 15 MG tablet  Commonly known as:  REMERON  Take 15 mg by mouth at bedtime.     polyethylene glycol packet  Commonly known as:  MIRALAX / GLYCOLAX  Take 17 g by mouth daily as needed.     senna 8.6 MG tablet  Commonly known as:  SENOKOT  Take 2 tablets by mouth daily as needed for constipation.     UNABLE TO FIND  Med Name: SF MedPass 120 mL TID        Immunizations: Immunization History  Administered Date(s) Administered  . PPD Test 04/29/2016     Physical  Exam: Filed Vitals:   05/02/16 1412  BP: 168/94  Pulse: 97  Temp: 98.1 F (36.7 C)  TempSrc: Oral  Resp: 16  Height: 5' 8.5" (1.74 m)  Weight: 153 lb (69.4 kg)  SpO2: 97%   Body mass index is 22.92 kg/(m^2).  General- elderly female, well built, in no acute distress Head- normocephalic, atraumatic Nose- no nasal discharge Throat- moist mucus membrane Eyes- PERRLA, EOMI, no pallor, no icterus, no discharge, normal conjunctiva, normal sclera Neck- no cervical lymphadenopathy Cardiovascular- normal s1,s2, no murmur, no leg edema Respiratory- bilateral clear to auscultation, no wheeze, no rhonchi, no crackles, no use of accessory muscles Abdomen- bowel sounds present, soft, non tender Musculoskeletal- able to move all 4 extremities, limited right leg range of motion, limited right shoulder ROM Neurological- alert and oriented to person and time  Skin- warm and dry, right hip surgical incision with staples in place, easy bruising noted Psychiatry- normal mood and affect    Labs reviewed: Basic Metabolic Panel:  Recent Labs  04/26/16 0506 04/27/16 0411 04/28/16 04/28/16 0410  NA 135 133* 13* 133*  K 4.2 4.3  --  4.5  CL 103 101  --  101  CO2 21* 23  --  24  GLUCOSE 191* 183*  --  202*  BUN 13 10 10 10   CREATININE 0.74 0.77 0.7 0.68  CALCIUM 9.1 8.7*  --  8.7*   Liver Function Tests:  Recent Labs  04/26/16 0506  AST 23  ALT 19  ALKPHOS 58  BILITOT 0.9  PROT 6.4*  ALBUMIN 4.0   No results for input(s): LIPASE, AMYLASE in the last 8760 hours. No results for input(s): AMMONIA in the last 8760 hours. CBC:  Recent Labs  04/26/16 0008 04/26/16 0506 04/27/16 0411 04/28/16 0410 04/29/16 04/29/16 0818  WBC 9.5 10.4 8.9 8.4 7.1 7.1  NEUTROABS 5.8 7.6  --   --   --   --   HGB 14.1 13.4 12.8 12.6  --  12.4  HCT 40.8 38.8 37.2 36.4  --  35.1*  MCV 89.5 89.2 90.3 89.9  --  86.9  PLT 218 193 157 161  --  184   Cardiac Enzymes: No results for input(s): CKTOTAL,  CKMB, CKMBINDEX, TROPONINI in the last 8760 hours. BNP: Invalid input(s): POCBNP CBG:  Recent Labs  04/28/16 2211 04/29/16 0751 04/29/16 1205  GLUCAP 212* 153* 195*    Radiological Exams: Dg Chest 1 View  04/26/2016  CLINICAL DATA:  Preop exam for hip fracture EXAM: CHEST 1 VIEW COMPARISON:  None. FINDINGS: Normal heart size and mediastinal contours. Dual-chamber pacer from the left. No acute infiltrate or edema. No effusion or pneumothorax. No acute osseous findings. IMPRESSION: No evidence of active disease or thoracic injury. Electronically Signed   By: Monte Fantasia  M.D.   On: 04/26/2016 00:55   Ct Head Wo Contrast  04/25/2016  CLINICAL DATA:  82 year old female with history of trauma from a fall while getting out of a car earlier this evening. Hematoma on the right side of the forehead. EXAM: CT HEAD WITHOUT CONTRAST TECHNIQUE: Contiguous axial images were obtained from the base of the skull through the vertex without intravenous contrast. COMPARISON:  No priors. FINDINGS: Soft tissue swelling in the right frontal scalp laterally where there is some high attenuation, compatible with a small scalp hematoma. Mild cerebral atrophy. Patchy and confluent areas of decreased attenuation are noted throughout the deep and periventricular white matter of the cerebral hemispheres bilaterally, compatible with chronic microvascular ischemic disease. No acute displaced skull fractures are identified. No acute intracranial abnormality. Specifically, no evidence of acute post-traumatic intracranial hemorrhage, no definite regions of acute/subacute cerebral ischemia, no focal mass, mass effect, hydrocephalus or abnormal intra or extra-axial fluid collections. The visualized paranasal sinuses and mastoids are well pneumatized. IMPRESSION: 1. Small right lateral frontal scalp hematoma. 2. No acute displaced skull fractures or evidence of significant acute traumatic injury to the brain. 3. Mild cerebral atrophy  with extensive chronic microvascular ischemic changes in the cerebral white matter. Electronically Signed   By: Vinnie Langton M.D.   On: 04/25/2016 23:55   Ct Pelvis Wo Contrast  04/26/2016  CLINICAL DATA:  Fall with right hip and pelvic pain. Initial encounter. EXAM: CT PELVIS WITHOUT CONTRAST TECHNIQUE: Multidetector CT imaging of the pelvis was performed following the standard protocol without intravenous contrast. Dedicated reformats of the pelvis and right hip were obtained. COMPARISON:  Radiography from late yesterday FINDINGS: Re- demonstrated subcapital right femoral neck fracture with impaction laterally. No involvement along the articular surface of the femoral head. There is no pelvic fracture. Degenerative spurring to the right acetabulum is mild. No hip joint narrowing. No myotendinous disruption. Osteopenia. No posttraumatic finding to the visceral pelvis. Lower lumbar disc and facet degeneration. IMPRESSION: Subcapital right femoral neck fracture with lateral impaction. Electronically Signed   By: Monte Fantasia M.D.   On: 04/26/2016 00:53   Pelvis Portable  04/26/2016  CLINICAL DATA:  Status post right hip pinning. EXAM: PORTABLE PELVIS 1-2 VIEWS COMPARISON:  CT pelvis dated 04/26/2016. FINDINGS: Interval placement of 3 fixation screws which traverse the right femoral neck fracture site. The screws appear intact and appropriately positioned. Osseous alignment is anatomic. Expected postsurgical changes are seen within the surrounding soft tissues. No evidence of surgical complicating feature. IMPRESSION: Status post placement of 3 fixation screws which traverse the right femoral neck fracture site. Osseous alignment is anatomic. No evidence of surgical complicating feature. Electronically Signed   By: Franki Cabot M.D.   On: 04/26/2016 14:58   Ct Hip Right Wo Contrast  04/26/2016  CLINICAL DATA:  Fall with right hip and pelvic pain. Initial encounter. EXAM: CT PELVIS WITHOUT CONTRAST  TECHNIQUE: Multidetector CT imaging of the pelvis was performed following the standard protocol without intravenous contrast. Dedicated reformats of the pelvis and right hip were obtained. COMPARISON:  Radiography from late yesterday FINDINGS: Re- demonstrated subcapital right femoral neck fracture with impaction laterally. No involvement along the articular surface of the femoral head. There is no pelvic fracture. Degenerative spurring to the right acetabulum is mild. No hip joint narrowing. No myotendinous disruption. Osteopenia. No posttraumatic finding to the visceral pelvis. Lower lumbar disc and facet degeneration. IMPRESSION: Subcapital right femoral neck fracture with lateral impaction. Electronically Signed   By: Angelica Chessman  Watts M.D.   On: 04/26/2016 00:53   Dg Hip Operative Unilat With Pelvis Right  04/26/2016  CLINICAL DATA:  ORIF right hip fracture EXAM: OPERATIVE RIGHT HIP (WITH PELVIS IF PERFORMED) 2 VIEWS TECHNIQUE: Fluoroscopic spot image(s) were submitted for interpretation post-operatively. COMPARISON:  04/25/2016 FINDINGS: Three pins through right femoral neck fracture. No significant displacement. Radiation Exposure Index (as provided by the fluoroscopic device): 7.18 mGy IMPRESSION: ORIF right femoral neck fracture Electronically Signed   By: Skipper Cliche M.D.   On: 04/26/2016 14:11   Dg Hip Unilat With Pelvis 2-3 Views Right  04/25/2016  CLINICAL DATA:  Fall with hip pain on the right.  Initial encounter. EXAM: DG HIP (WITH OR WITHOUT PELVIS) 2-3V RIGHT COMPARISON:  None. FINDINGS: Right femoral neck fracture with lateral impaction. No notable degenerative changes to the acetabula. No evidence of pelvic ring fracture or diastasis. IMPRESSION: Mildly impacted subcapital right femoral neck fracture. Electronically Signed   By: Monte Fantasia M.D.   On: 04/25/2016 23:44    Assessment/Plan  Unsteady gait Will have patient work with PT/OT as tolerated to regain strength and restore  function.  Fall precautions are in place.  Right femoral neck fracture  S/P cannulated hip pinning. continue Norco 5/325 mg 1-2 tab q6h prn pain. Continue aspirin 325 mg bid for dvt prophylaxis. Has follow up with orthopedics  Hyponatremia Monitor bmp  Elevated BP Monitor bp/hr bid x 1 week to address further  Dm type 2 Monitor cbg, continue glucophage 500 mg daily  Dementia Continue supportive care, has caregiver 24 x 7 in her room  Constipation Continue senna s 2 tab daily prn and miralax daily as needed and monitor  COPD  Stable. continue symbicort for now  Depression Has been started on remeron 15 mg daily and pending psych consult  Sick sinus syndrome S/p pacemaker, monitor   Goals of care: short term rehabilitation   Labs/tests ordered: cbc, cmp  Family/ staff Communication: reviewed care plan with patient and nursing supervisor    Blanchie Serve, MD Internal Medicine [place], [place] [address] Cell Phone (Monday-Friday 8 am - 5 pm): [phone] On Call: [phone] and follow prompts after 5 pm and on weekends Office Phone: [phone] Office Fax: [phone]

## 2016-05-05 ENCOUNTER — Other Ambulatory Visit: Payer: Self-pay

## 2016-05-05 MED ORDER — TRAMADOL HCL 50 MG PO TABS: 50.0000 mg | ORAL_TABLET | Freq: Two times a day (BID) | ORAL | Status: DC

## 2016-05-05 NOTE — Telephone Encounter (Signed)
Rx fax number [phone], phone number [phone]

## 2016-05-07 DIAGNOSIS — S72091D Other fracture of head and neck of right femur, subsequent encounter for closed fracture with routine healing: Secondary | ICD-10-CM | POA: Diagnosis not present

## 2016-05-20 ENCOUNTER — Non-Acute Institutional Stay (SKILLED_NURSING_FACILITY): Payer: Medicare Other | Admitting: Adult Health

## 2016-05-20 ENCOUNTER — Encounter: Payer: Self-pay | Admitting: Adult Health

## 2016-05-20 DIAGNOSIS — F329 Major depressive disorder, single episode, unspecified: Secondary | ICD-10-CM

## 2016-05-20 DIAGNOSIS — R2681 Unsteadiness on feet: Secondary | ICD-10-CM | POA: Diagnosis not present

## 2016-05-20 DIAGNOSIS — E871 Hypo-osmolality and hyponatremia: Secondary | ICD-10-CM

## 2016-05-20 DIAGNOSIS — S72001S Fracture of unspecified part of neck of right femur, sequela: Secondary | ICD-10-CM

## 2016-05-20 DIAGNOSIS — E1165 Type 2 diabetes mellitus with hyperglycemia: Secondary | ICD-10-CM

## 2016-05-20 DIAGNOSIS — F32A Depression, unspecified: Secondary | ICD-10-CM

## 2016-05-20 DIAGNOSIS — K5901 Slow transit constipation: Secondary | ICD-10-CM | POA: Diagnosis not present

## 2016-05-20 DIAGNOSIS — Z87898 Personal history of other specified conditions: Secondary | ICD-10-CM

## 2016-05-20 DIAGNOSIS — Z9189 Other specified personal risk factors, not elsewhere classified: Secondary | ICD-10-CM

## 2016-05-20 DIAGNOSIS — G309 Alzheimer's disease, unspecified: Secondary | ICD-10-CM

## 2016-05-20 DIAGNOSIS — F0281 Dementia in other diseases classified elsewhere with behavioral disturbance: Secondary | ICD-10-CM

## 2016-05-20 DIAGNOSIS — J438 Other emphysema: Secondary | ICD-10-CM

## 2016-05-20 DIAGNOSIS — G308 Other Alzheimer's disease: Secondary | ICD-10-CM
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
## 2016-05-23 DIAGNOSIS — R2681 Unsteadiness on feet: Secondary | ICD-10-CM | POA: Diagnosis not present

## 2016-05-23 DIAGNOSIS — Z9181 History of falling: Secondary | ICD-10-CM | POA: Diagnosis not present

## 2016-05-23 DIAGNOSIS — M6281 Muscle weakness (generalized): Secondary | ICD-10-CM | POA: Diagnosis not present

## 2016-05-27 DIAGNOSIS — Z9181 History of falling: Secondary | ICD-10-CM | POA: Diagnosis not present

## 2016-05-27 DIAGNOSIS — R2681 Unsteadiness on feet: Secondary | ICD-10-CM | POA: Diagnosis not present

## 2016-05-27 DIAGNOSIS — M6281 Muscle weakness (generalized): Secondary | ICD-10-CM | POA: Diagnosis not present

## 2016-05-29 DIAGNOSIS — F0281 Dementia in other diseases classified elsewhere with behavioral disturbance: Secondary | ICD-10-CM | POA: Diagnosis not present

## 2016-05-29 DIAGNOSIS — M6281 Muscle weakness (generalized): Secondary | ICD-10-CM | POA: Diagnosis not present

## 2016-05-29 DIAGNOSIS — R2681 Unsteadiness on feet: Secondary | ICD-10-CM | POA: Diagnosis not present

## 2016-05-29 DIAGNOSIS — R41841 Cognitive communication deficit: Secondary | ICD-10-CM | POA: Diagnosis not present

## 2016-05-29 DIAGNOSIS — R488 Other symbolic dysfunctions: Secondary | ICD-10-CM | POA: Diagnosis not present

## 2016-05-29 DIAGNOSIS — Z9181 History of falling: Secondary | ICD-10-CM | POA: Diagnosis not present

## 2016-05-29 DIAGNOSIS — G301 Alzheimer's disease with late onset: Secondary | ICD-10-CM | POA: Diagnosis not present

## 2016-05-30 DIAGNOSIS — R2681 Unsteadiness on feet: Secondary | ICD-10-CM | POA: Diagnosis not present

## 2016-05-30 DIAGNOSIS — F0281 Dementia in other diseases classified elsewhere with behavioral disturbance: Secondary | ICD-10-CM | POA: Diagnosis not present

## 2016-05-30 DIAGNOSIS — R488 Other symbolic dysfunctions: Secondary | ICD-10-CM | POA: Diagnosis not present

## 2016-05-30 DIAGNOSIS — M6281 Muscle weakness (generalized): Secondary | ICD-10-CM | POA: Diagnosis not present

## 2016-05-30 DIAGNOSIS — R41841 Cognitive communication deficit: Secondary | ICD-10-CM | POA: Diagnosis not present

## 2016-05-30 DIAGNOSIS — G301 Alzheimer's disease with late onset: Secondary | ICD-10-CM | POA: Diagnosis not present

## 2016-06-02 DIAGNOSIS — G301 Alzheimer's disease with late onset: Secondary | ICD-10-CM | POA: Diagnosis not present

## 2016-06-02 DIAGNOSIS — M6281 Muscle weakness (generalized): Secondary | ICD-10-CM | POA: Diagnosis not present

## 2016-06-02 DIAGNOSIS — R41841 Cognitive communication deficit: Secondary | ICD-10-CM | POA: Diagnosis not present

## 2016-06-02 DIAGNOSIS — R488 Other symbolic dysfunctions: Secondary | ICD-10-CM | POA: Diagnosis not present

## 2016-06-02 DIAGNOSIS — R2681 Unsteadiness on feet: Secondary | ICD-10-CM | POA: Diagnosis not present

## 2016-06-02 DIAGNOSIS — N390 Urinary tract infection, site not specified: Secondary | ICD-10-CM | POA: Diagnosis not present

## 2016-06-02 DIAGNOSIS — F0281 Dementia in other diseases classified elsewhere with behavioral disturbance: Secondary | ICD-10-CM | POA: Diagnosis not present

## 2016-06-03 DIAGNOSIS — G301 Alzheimer's disease with late onset: Secondary | ICD-10-CM | POA: Diagnosis not present

## 2016-06-03 DIAGNOSIS — F0281 Dementia in other diseases classified elsewhere with behavioral disturbance: Secondary | ICD-10-CM | POA: Diagnosis not present

## 2016-06-03 DIAGNOSIS — M6281 Muscle weakness (generalized): Secondary | ICD-10-CM | POA: Diagnosis not present

## 2016-06-03 DIAGNOSIS — R41841 Cognitive communication deficit: Secondary | ICD-10-CM | POA: Diagnosis not present

## 2016-06-03 DIAGNOSIS — R488 Other symbolic dysfunctions: Secondary | ICD-10-CM | POA: Diagnosis not present

## 2016-06-03 DIAGNOSIS — R2681 Unsteadiness on feet: Secondary | ICD-10-CM | POA: Diagnosis not present

## 2016-06-04 DIAGNOSIS — F0281 Dementia in other diseases classified elsewhere with behavioral disturbance: Secondary | ICD-10-CM | POA: Diagnosis not present

## 2016-06-04 DIAGNOSIS — S72091D Other fracture of head and neck of right femur, subsequent encounter for closed fracture with routine healing: Secondary | ICD-10-CM | POA: Diagnosis not present

## 2016-06-04 DIAGNOSIS — R488 Other symbolic dysfunctions: Secondary | ICD-10-CM | POA: Diagnosis not present

## 2016-06-04 DIAGNOSIS — R2681 Unsteadiness on feet: Secondary | ICD-10-CM | POA: Diagnosis not present

## 2016-06-04 DIAGNOSIS — M6281 Muscle weakness (generalized): Secondary | ICD-10-CM | POA: Diagnosis not present

## 2016-06-04 DIAGNOSIS — R41841 Cognitive communication deficit: Secondary | ICD-10-CM | POA: Diagnosis not present

## 2016-06-04 DIAGNOSIS — G301 Alzheimer's disease with late onset: Secondary | ICD-10-CM | POA: Diagnosis not present

## 2016-06-05 DIAGNOSIS — G301 Alzheimer's disease with late onset: Secondary | ICD-10-CM | POA: Diagnosis not present

## 2016-06-05 DIAGNOSIS — R488 Other symbolic dysfunctions: Secondary | ICD-10-CM | POA: Diagnosis not present

## 2016-06-05 DIAGNOSIS — F0281 Dementia in other diseases classified elsewhere with behavioral disturbance: Secondary | ICD-10-CM | POA: Diagnosis not present

## 2016-06-05 DIAGNOSIS — M6281 Muscle weakness (generalized): Secondary | ICD-10-CM | POA: Diagnosis not present

## 2016-06-05 DIAGNOSIS — R2681 Unsteadiness on feet: Secondary | ICD-10-CM | POA: Diagnosis not present

## 2016-06-05 DIAGNOSIS — R41841 Cognitive communication deficit: Secondary | ICD-10-CM | POA: Diagnosis not present

## 2016-06-06 DIAGNOSIS — R488 Other symbolic dysfunctions: Secondary | ICD-10-CM | POA: Diagnosis not present

## 2016-06-06 DIAGNOSIS — R2681 Unsteadiness on feet: Secondary | ICD-10-CM | POA: Diagnosis not present

## 2016-06-06 DIAGNOSIS — F0281 Dementia in other diseases classified elsewhere with behavioral disturbance: Secondary | ICD-10-CM | POA: Diagnosis not present

## 2016-06-06 DIAGNOSIS — R41841 Cognitive communication deficit: Secondary | ICD-10-CM | POA: Diagnosis not present

## 2016-06-06 DIAGNOSIS — G301 Alzheimer's disease with late onset: Secondary | ICD-10-CM | POA: Diagnosis not present

## 2016-06-06 DIAGNOSIS — M6281 Muscle weakness (generalized): Secondary | ICD-10-CM | POA: Diagnosis not present

## 2016-06-09 DIAGNOSIS — R488 Other symbolic dysfunctions: Secondary | ICD-10-CM | POA: Diagnosis not present

## 2016-06-09 DIAGNOSIS — F0281 Dementia in other diseases classified elsewhere with behavioral disturbance: Secondary | ICD-10-CM | POA: Diagnosis not present

## 2016-06-09 DIAGNOSIS — M6281 Muscle weakness (generalized): Secondary | ICD-10-CM | POA: Diagnosis not present

## 2016-06-09 DIAGNOSIS — R41841 Cognitive communication deficit: Secondary | ICD-10-CM | POA: Diagnosis not present

## 2016-06-09 DIAGNOSIS — R2681 Unsteadiness on feet: Secondary | ICD-10-CM | POA: Diagnosis not present

## 2016-06-09 DIAGNOSIS — G301 Alzheimer's disease with late onset: Secondary | ICD-10-CM | POA: Diagnosis not present

## 2016-06-10 DIAGNOSIS — R488 Other symbolic dysfunctions: Secondary | ICD-10-CM | POA: Diagnosis not present

## 2016-06-10 DIAGNOSIS — E1140 Type 2 diabetes mellitus with diabetic neuropathy, unspecified: Secondary | ICD-10-CM | POA: Diagnosis not present

## 2016-06-10 DIAGNOSIS — R2681 Unsteadiness on feet: Secondary | ICD-10-CM | POA: Diagnosis not present

## 2016-06-10 DIAGNOSIS — D692 Other nonthrombocytopenic purpura: Secondary | ICD-10-CM | POA: Diagnosis not present

## 2016-06-10 DIAGNOSIS — Z79899 Other long term (current) drug therapy: Secondary | ICD-10-CM | POA: Diagnosis not present

## 2016-06-10 DIAGNOSIS — F325 Major depressive disorder, single episode, in full remission: Secondary | ICD-10-CM | POA: Diagnosis not present

## 2016-06-10 DIAGNOSIS — Z0000 Encounter for general adult medical examination without abnormal findings: Secondary | ICD-10-CM | POA: Diagnosis not present

## 2016-06-10 DIAGNOSIS — J449 Chronic obstructive pulmonary disease, unspecified: Secondary | ICD-10-CM | POA: Diagnosis not present

## 2016-06-10 DIAGNOSIS — Z7984 Long term (current) use of oral hypoglycemic drugs: Secondary | ICD-10-CM | POA: Diagnosis not present

## 2016-06-10 DIAGNOSIS — R41841 Cognitive communication deficit: Secondary | ICD-10-CM | POA: Diagnosis not present

## 2016-06-10 DIAGNOSIS — M6281 Muscle weakness (generalized): Secondary | ICD-10-CM | POA: Diagnosis not present

## 2016-06-10 DIAGNOSIS — G309 Alzheimer's disease, unspecified: Secondary | ICD-10-CM | POA: Diagnosis not present

## 2016-06-10 DIAGNOSIS — E7800 Pure hypercholesterolemia, unspecified: Secondary | ICD-10-CM | POA: Diagnosis not present

## 2016-06-10 DIAGNOSIS — F0281 Dementia in other diseases classified elsewhere with behavioral disturbance: Secondary | ICD-10-CM | POA: Diagnosis not present

## 2016-06-10 DIAGNOSIS — G301 Alzheimer's disease with late onset: Secondary | ICD-10-CM | POA: Diagnosis not present

## 2016-06-11 DIAGNOSIS — R41841 Cognitive communication deficit: Secondary | ICD-10-CM | POA: Diagnosis not present

## 2016-06-11 DIAGNOSIS — R2681 Unsteadiness on feet: Secondary | ICD-10-CM | POA: Diagnosis not present

## 2016-06-11 DIAGNOSIS — G301 Alzheimer's disease with late onset: Secondary | ICD-10-CM | POA: Diagnosis not present

## 2016-06-11 DIAGNOSIS — R488 Other symbolic dysfunctions: Secondary | ICD-10-CM | POA: Diagnosis not present

## 2016-06-11 DIAGNOSIS — Z7984 Long term (current) use of oral hypoglycemic drugs: Secondary | ICD-10-CM | POA: Diagnosis not present

## 2016-06-11 DIAGNOSIS — F0281 Dementia in other diseases classified elsewhere with behavioral disturbance: Secondary | ICD-10-CM | POA: Diagnosis not present

## 2016-06-11 DIAGNOSIS — E1140 Type 2 diabetes mellitus with diabetic neuropathy, unspecified: Secondary | ICD-10-CM | POA: Diagnosis not present

## 2016-06-11 DIAGNOSIS — M6281 Muscle weakness (generalized): Secondary | ICD-10-CM | POA: Diagnosis not present

## 2016-06-12 DIAGNOSIS — F0281 Dementia in other diseases classified elsewhere with behavioral disturbance: Secondary | ICD-10-CM | POA: Diagnosis not present

## 2016-06-12 DIAGNOSIS — R488 Other symbolic dysfunctions: Secondary | ICD-10-CM | POA: Diagnosis not present

## 2016-06-12 DIAGNOSIS — R2681 Unsteadiness on feet: Secondary | ICD-10-CM | POA: Diagnosis not present

## 2016-06-12 DIAGNOSIS — M6281 Muscle weakness (generalized): Secondary | ICD-10-CM | POA: Diagnosis not present

## 2016-06-12 DIAGNOSIS — R41841 Cognitive communication deficit: Secondary | ICD-10-CM | POA: Diagnosis not present

## 2016-06-12 DIAGNOSIS — G301 Alzheimer's disease with late onset: Secondary | ICD-10-CM | POA: Diagnosis not present

## 2016-06-13 DIAGNOSIS — F0281 Dementia in other diseases classified elsewhere with behavioral disturbance: Secondary | ICD-10-CM | POA: Diagnosis not present

## 2016-06-13 DIAGNOSIS — R488 Other symbolic dysfunctions: Secondary | ICD-10-CM | POA: Diagnosis not present

## 2016-06-13 DIAGNOSIS — R41841 Cognitive communication deficit: Secondary | ICD-10-CM | POA: Diagnosis not present

## 2016-06-13 DIAGNOSIS — R2681 Unsteadiness on feet: Secondary | ICD-10-CM | POA: Diagnosis not present

## 2016-06-13 DIAGNOSIS — G301 Alzheimer's disease with late onset: Secondary | ICD-10-CM | POA: Diagnosis not present

## 2016-06-13 DIAGNOSIS — M6281 Muscle weakness (generalized): Secondary | ICD-10-CM | POA: Diagnosis not present

## 2016-06-16 DIAGNOSIS — M6281 Muscle weakness (generalized): Secondary | ICD-10-CM | POA: Diagnosis not present

## 2016-06-16 DIAGNOSIS — F0281 Dementia in other diseases classified elsewhere with behavioral disturbance: Secondary | ICD-10-CM | POA: Diagnosis not present

## 2016-06-16 DIAGNOSIS — R488 Other symbolic dysfunctions: Secondary | ICD-10-CM | POA: Diagnosis not present

## 2016-06-16 DIAGNOSIS — R2681 Unsteadiness on feet: Secondary | ICD-10-CM | POA: Diagnosis not present

## 2016-06-16 DIAGNOSIS — G301 Alzheimer's disease with late onset: Secondary | ICD-10-CM | POA: Diagnosis not present

## 2016-06-16 DIAGNOSIS — R41841 Cognitive communication deficit: Secondary | ICD-10-CM | POA: Diagnosis not present

## 2016-06-17 DIAGNOSIS — M6281 Muscle weakness (generalized): Secondary | ICD-10-CM | POA: Diagnosis not present

## 2016-06-17 DIAGNOSIS — R41841 Cognitive communication deficit: Secondary | ICD-10-CM | POA: Diagnosis not present

## 2016-06-17 DIAGNOSIS — F0281 Dementia in other diseases classified elsewhere with behavioral disturbance: Secondary | ICD-10-CM | POA: Diagnosis not present

## 2016-06-17 DIAGNOSIS — G301 Alzheimer's disease with late onset: Secondary | ICD-10-CM | POA: Diagnosis not present

## 2016-06-17 DIAGNOSIS — R488 Other symbolic dysfunctions: Secondary | ICD-10-CM | POA: Diagnosis not present

## 2016-06-17 DIAGNOSIS — R2681 Unsteadiness on feet: Secondary | ICD-10-CM | POA: Diagnosis not present

## 2016-06-18 DIAGNOSIS — G301 Alzheimer's disease with late onset: Secondary | ICD-10-CM | POA: Diagnosis not present

## 2016-06-18 DIAGNOSIS — R2681 Unsteadiness on feet: Secondary | ICD-10-CM | POA: Diagnosis not present

## 2016-06-18 DIAGNOSIS — R488 Other symbolic dysfunctions: Secondary | ICD-10-CM | POA: Diagnosis not present

## 2016-06-18 DIAGNOSIS — R41841 Cognitive communication deficit: Secondary | ICD-10-CM | POA: Diagnosis not present

## 2016-06-18 DIAGNOSIS — F0281 Dementia in other diseases classified elsewhere with behavioral disturbance: Secondary | ICD-10-CM | POA: Diagnosis not present

## 2016-06-18 DIAGNOSIS — M6281 Muscle weakness (generalized): Secondary | ICD-10-CM | POA: Diagnosis not present

## 2016-06-19 DIAGNOSIS — E871 Hypo-osmolality and hyponatremia: Secondary | ICD-10-CM | POA: Diagnosis not present

## 2016-06-19 DIAGNOSIS — Z79899 Other long term (current) drug therapy: Secondary | ICD-10-CM | POA: Diagnosis not present

## 2016-06-20 DIAGNOSIS — R2681 Unsteadiness on feet: Secondary | ICD-10-CM | POA: Diagnosis not present

## 2016-06-20 DIAGNOSIS — R41841 Cognitive communication deficit: Secondary | ICD-10-CM | POA: Diagnosis not present

## 2016-06-20 DIAGNOSIS — M6281 Muscle weakness (generalized): Secondary | ICD-10-CM | POA: Diagnosis not present

## 2016-06-20 DIAGNOSIS — F0281 Dementia in other diseases classified elsewhere with behavioral disturbance: Secondary | ICD-10-CM | POA: Diagnosis not present

## 2016-06-20 DIAGNOSIS — G301 Alzheimer's disease with late onset: Secondary | ICD-10-CM | POA: Diagnosis not present

## 2016-06-20 DIAGNOSIS — R488 Other symbolic dysfunctions: Secondary | ICD-10-CM | POA: Diagnosis not present

## 2016-06-23 ENCOUNTER — Other Ambulatory Visit: Payer: Self-pay | Admitting: Adult Health

## 2016-06-23 DIAGNOSIS — R2681 Unsteadiness on feet: Secondary | ICD-10-CM | POA: Diagnosis not present

## 2016-06-23 DIAGNOSIS — F0281 Dementia in other diseases classified elsewhere with behavioral disturbance: Secondary | ICD-10-CM | POA: Diagnosis not present

## 2016-06-23 DIAGNOSIS — M6281 Muscle weakness (generalized): Secondary | ICD-10-CM | POA: Diagnosis not present

## 2016-06-23 DIAGNOSIS — R41841 Cognitive communication deficit: Secondary | ICD-10-CM | POA: Diagnosis not present

## 2016-06-23 DIAGNOSIS — G301 Alzheimer's disease with late onset: Secondary | ICD-10-CM | POA: Diagnosis not present

## 2016-06-23 DIAGNOSIS — R488 Other symbolic dysfunctions: Secondary | ICD-10-CM | POA: Diagnosis not present

## 2016-06-24 DIAGNOSIS — M6281 Muscle weakness (generalized): Secondary | ICD-10-CM | POA: Diagnosis not present

## 2016-06-24 DIAGNOSIS — G301 Alzheimer's disease with late onset: Secondary | ICD-10-CM | POA: Diagnosis not present

## 2016-06-24 DIAGNOSIS — R41841 Cognitive communication deficit: Secondary | ICD-10-CM | POA: Diagnosis not present

## 2016-06-24 DIAGNOSIS — F0281 Dementia in other diseases classified elsewhere with behavioral disturbance: Secondary | ICD-10-CM | POA: Diagnosis not present

## 2016-06-24 DIAGNOSIS — R488 Other symbolic dysfunctions: Secondary | ICD-10-CM | POA: Diagnosis not present

## 2016-06-24 DIAGNOSIS — R2681 Unsteadiness on feet: Secondary | ICD-10-CM | POA: Diagnosis not present

## 2016-06-25 DIAGNOSIS — R488 Other symbolic dysfunctions: Secondary | ICD-10-CM | POA: Diagnosis not present

## 2016-06-25 DIAGNOSIS — M6281 Muscle weakness (generalized): Secondary | ICD-10-CM | POA: Diagnosis not present

## 2016-06-25 DIAGNOSIS — F0281 Dementia in other diseases classified elsewhere with behavioral disturbance: Secondary | ICD-10-CM | POA: Diagnosis not present

## 2016-06-25 DIAGNOSIS — G301 Alzheimer's disease with late onset: Secondary | ICD-10-CM | POA: Diagnosis not present

## 2016-06-25 DIAGNOSIS — R2681 Unsteadiness on feet: Secondary | ICD-10-CM | POA: Diagnosis not present

## 2016-06-25 DIAGNOSIS — R41841 Cognitive communication deficit: Secondary | ICD-10-CM | POA: Diagnosis not present

## 2016-06-26 DIAGNOSIS — R41841 Cognitive communication deficit: Secondary | ICD-10-CM | POA: Diagnosis not present

## 2016-06-26 DIAGNOSIS — F0281 Dementia in other diseases classified elsewhere with behavioral disturbance: Secondary | ICD-10-CM | POA: Diagnosis not present

## 2016-06-26 DIAGNOSIS — R488 Other symbolic dysfunctions: Secondary | ICD-10-CM | POA: Diagnosis not present

## 2016-06-26 DIAGNOSIS — M6281 Muscle weakness (generalized): Secondary | ICD-10-CM | POA: Diagnosis not present

## 2016-06-26 DIAGNOSIS — G301 Alzheimer's disease with late onset: Secondary | ICD-10-CM | POA: Diagnosis not present

## 2016-06-26 DIAGNOSIS — R2681 Unsteadiness on feet: Secondary | ICD-10-CM | POA: Diagnosis not present

## 2016-06-27 DIAGNOSIS — R488 Other symbolic dysfunctions: Secondary | ICD-10-CM | POA: Diagnosis not present

## 2016-06-27 DIAGNOSIS — R2681 Unsteadiness on feet: Secondary | ICD-10-CM | POA: Diagnosis not present

## 2016-06-27 DIAGNOSIS — M6281 Muscle weakness (generalized): Secondary | ICD-10-CM | POA: Diagnosis not present

## 2016-06-27 DIAGNOSIS — F0281 Dementia in other diseases classified elsewhere with behavioral disturbance: Secondary | ICD-10-CM | POA: Diagnosis not present

## 2016-06-27 DIAGNOSIS — R41841 Cognitive communication deficit: Secondary | ICD-10-CM | POA: Diagnosis not present

## 2016-06-27 DIAGNOSIS — G301 Alzheimer's disease with late onset: Secondary | ICD-10-CM | POA: Diagnosis not present

## 2016-06-30 DIAGNOSIS — R41841 Cognitive communication deficit: Secondary | ICD-10-CM | POA: Diagnosis not present

## 2016-06-30 DIAGNOSIS — R488 Other symbolic dysfunctions: Secondary | ICD-10-CM | POA: Diagnosis not present

## 2016-06-30 DIAGNOSIS — F0281 Dementia in other diseases classified elsewhere with behavioral disturbance: Secondary | ICD-10-CM | POA: Diagnosis not present

## 2016-06-30 DIAGNOSIS — Z9181 History of falling: Secondary | ICD-10-CM | POA: Diagnosis not present

## 2016-06-30 DIAGNOSIS — N3946 Mixed incontinence: Secondary | ICD-10-CM | POA: Diagnosis not present

## 2016-06-30 DIAGNOSIS — G301 Alzheimer's disease with late onset: Secondary | ICD-10-CM | POA: Diagnosis not present

## 2016-06-30 DIAGNOSIS — M6281 Muscle weakness (generalized): Secondary | ICD-10-CM | POA: Diagnosis not present

## 2016-06-30 DIAGNOSIS — R2681 Unsteadiness on feet: Secondary | ICD-10-CM | POA: Diagnosis not present

## 2016-07-02 DIAGNOSIS — F0281 Dementia in other diseases classified elsewhere with behavioral disturbance: Secondary | ICD-10-CM | POA: Diagnosis not present

## 2016-07-02 DIAGNOSIS — M6281 Muscle weakness (generalized): Secondary | ICD-10-CM | POA: Diagnosis not present

## 2016-07-02 DIAGNOSIS — R41841 Cognitive communication deficit: Secondary | ICD-10-CM | POA: Diagnosis not present

## 2016-07-02 DIAGNOSIS — N3946 Mixed incontinence: Secondary | ICD-10-CM | POA: Diagnosis not present

## 2016-07-02 DIAGNOSIS — R2681 Unsteadiness on feet: Secondary | ICD-10-CM | POA: Diagnosis not present

## 2016-07-02 DIAGNOSIS — R488 Other symbolic dysfunctions: Secondary | ICD-10-CM | POA: Diagnosis not present

## 2016-07-03 DIAGNOSIS — M6281 Muscle weakness (generalized): Secondary | ICD-10-CM | POA: Diagnosis not present

## 2016-07-03 DIAGNOSIS — R2681 Unsteadiness on feet: Secondary | ICD-10-CM | POA: Diagnosis not present

## 2016-07-03 DIAGNOSIS — F0281 Dementia in other diseases classified elsewhere with behavioral disturbance: Secondary | ICD-10-CM | POA: Diagnosis not present

## 2016-07-03 DIAGNOSIS — N3946 Mixed incontinence: Secondary | ICD-10-CM | POA: Diagnosis not present

## 2016-07-03 DIAGNOSIS — R488 Other symbolic dysfunctions: Secondary | ICD-10-CM | POA: Diagnosis not present

## 2016-07-03 DIAGNOSIS — R41841 Cognitive communication deficit: Secondary | ICD-10-CM | POA: Diagnosis not present

## 2016-07-04 DIAGNOSIS — F0281 Dementia in other diseases classified elsewhere with behavioral disturbance: Secondary | ICD-10-CM | POA: Diagnosis not present

## 2016-07-04 DIAGNOSIS — M6281 Muscle weakness (generalized): Secondary | ICD-10-CM | POA: Diagnosis not present

## 2016-07-04 DIAGNOSIS — R488 Other symbolic dysfunctions: Secondary | ICD-10-CM | POA: Diagnosis not present

## 2016-07-04 DIAGNOSIS — N3946 Mixed incontinence: Secondary | ICD-10-CM | POA: Diagnosis not present

## 2016-07-04 DIAGNOSIS — R2681 Unsteadiness on feet: Secondary | ICD-10-CM | POA: Diagnosis not present

## 2016-07-04 DIAGNOSIS — R41841 Cognitive communication deficit: Secondary | ICD-10-CM | POA: Diagnosis not present

## 2016-07-07 DIAGNOSIS — R41841 Cognitive communication deficit: Secondary | ICD-10-CM | POA: Diagnosis not present

## 2016-07-07 DIAGNOSIS — R488 Other symbolic dysfunctions: Secondary | ICD-10-CM | POA: Diagnosis not present

## 2016-07-07 DIAGNOSIS — R2681 Unsteadiness on feet: Secondary | ICD-10-CM | POA: Diagnosis not present

## 2016-07-07 DIAGNOSIS — F0281 Dementia in other diseases classified elsewhere with behavioral disturbance: Secondary | ICD-10-CM | POA: Diagnosis not present

## 2016-07-07 DIAGNOSIS — N3946 Mixed incontinence: Secondary | ICD-10-CM | POA: Diagnosis not present

## 2016-07-07 DIAGNOSIS — M6281 Muscle weakness (generalized): Secondary | ICD-10-CM | POA: Diagnosis not present

## 2016-07-08 DIAGNOSIS — E1142 Type 2 diabetes mellitus with diabetic polyneuropathy: Secondary | ICD-10-CM | POA: Diagnosis not present

## 2016-07-08 DIAGNOSIS — R41841 Cognitive communication deficit: Secondary | ICD-10-CM | POA: Diagnosis not present

## 2016-07-08 DIAGNOSIS — R488 Other symbolic dysfunctions: Secondary | ICD-10-CM | POA: Diagnosis not present

## 2016-07-08 DIAGNOSIS — N3946 Mixed incontinence: Secondary | ICD-10-CM | POA: Diagnosis not present

## 2016-07-08 DIAGNOSIS — R2681 Unsteadiness on feet: Secondary | ICD-10-CM | POA: Diagnosis not present

## 2016-07-08 DIAGNOSIS — M6281 Muscle weakness (generalized): Secondary | ICD-10-CM | POA: Diagnosis not present

## 2016-07-08 DIAGNOSIS — F0281 Dementia in other diseases classified elsewhere with behavioral disturbance: Secondary | ICD-10-CM | POA: Diagnosis not present

## 2016-07-09 DIAGNOSIS — R2681 Unsteadiness on feet: Secondary | ICD-10-CM | POA: Diagnosis not present

## 2016-07-09 DIAGNOSIS — R488 Other symbolic dysfunctions: Secondary | ICD-10-CM | POA: Diagnosis not present

## 2016-07-09 DIAGNOSIS — N3946 Mixed incontinence: Secondary | ICD-10-CM | POA: Diagnosis not present

## 2016-07-09 DIAGNOSIS — M6281 Muscle weakness (generalized): Secondary | ICD-10-CM | POA: Diagnosis not present

## 2016-07-09 DIAGNOSIS — F0281 Dementia in other diseases classified elsewhere with behavioral disturbance: Secondary | ICD-10-CM | POA: Diagnosis not present

## 2016-07-09 DIAGNOSIS — R41841 Cognitive communication deficit: Secondary | ICD-10-CM | POA: Diagnosis not present

## 2016-07-10 DIAGNOSIS — N3946 Mixed incontinence: Secondary | ICD-10-CM | POA: Diagnosis not present

## 2016-07-10 DIAGNOSIS — R41841 Cognitive communication deficit: Secondary | ICD-10-CM | POA: Diagnosis not present

## 2016-07-10 DIAGNOSIS — M6281 Muscle weakness (generalized): Secondary | ICD-10-CM | POA: Diagnosis not present

## 2016-07-10 DIAGNOSIS — R488 Other symbolic dysfunctions: Secondary | ICD-10-CM | POA: Diagnosis not present

## 2016-07-10 DIAGNOSIS — R2681 Unsteadiness on feet: Secondary | ICD-10-CM | POA: Diagnosis not present

## 2016-07-10 DIAGNOSIS — F0281 Dementia in other diseases classified elsewhere with behavioral disturbance: Secondary | ICD-10-CM | POA: Diagnosis not present

## 2016-07-11 DIAGNOSIS — R488 Other symbolic dysfunctions: Secondary | ICD-10-CM | POA: Diagnosis not present

## 2016-07-11 DIAGNOSIS — R41841 Cognitive communication deficit: Secondary | ICD-10-CM | POA: Diagnosis not present

## 2016-07-11 DIAGNOSIS — M6281 Muscle weakness (generalized): Secondary | ICD-10-CM | POA: Diagnosis not present

## 2016-07-11 DIAGNOSIS — N3946 Mixed incontinence: Secondary | ICD-10-CM | POA: Diagnosis not present

## 2016-07-11 DIAGNOSIS — R2681 Unsteadiness on feet: Secondary | ICD-10-CM | POA: Diagnosis not present

## 2016-07-11 DIAGNOSIS — F0281 Dementia in other diseases classified elsewhere with behavioral disturbance: Secondary | ICD-10-CM | POA: Diagnosis not present

## 2016-07-14 DIAGNOSIS — M6281 Muscle weakness (generalized): Secondary | ICD-10-CM | POA: Diagnosis not present

## 2016-07-14 DIAGNOSIS — R488 Other symbolic dysfunctions: Secondary | ICD-10-CM | POA: Diagnosis not present

## 2016-07-14 DIAGNOSIS — R41841 Cognitive communication deficit: Secondary | ICD-10-CM | POA: Diagnosis not present

## 2016-07-14 DIAGNOSIS — R2681 Unsteadiness on feet: Secondary | ICD-10-CM | POA: Diagnosis not present

## 2016-07-14 DIAGNOSIS — F0281 Dementia in other diseases classified elsewhere with behavioral disturbance: Secondary | ICD-10-CM | POA: Diagnosis not present

## 2016-07-14 DIAGNOSIS — N3946 Mixed incontinence: Secondary | ICD-10-CM | POA: Diagnosis not present

## 2016-07-16 DIAGNOSIS — S72091D Other fracture of head and neck of right femur, subsequent encounter for closed fracture with routine healing: Secondary | ICD-10-CM | POA: Diagnosis not present

## 2016-07-16 DIAGNOSIS — R2681 Unsteadiness on feet: Secondary | ICD-10-CM | POA: Diagnosis not present

## 2016-07-16 DIAGNOSIS — M6281 Muscle weakness (generalized): Secondary | ICD-10-CM | POA: Diagnosis not present

## 2016-07-16 DIAGNOSIS — R41841 Cognitive communication deficit: Secondary | ICD-10-CM | POA: Diagnosis not present

## 2016-07-16 DIAGNOSIS — R488 Other symbolic dysfunctions: Secondary | ICD-10-CM | POA: Diagnosis not present

## 2016-07-16 DIAGNOSIS — N3946 Mixed incontinence: Secondary | ICD-10-CM | POA: Diagnosis not present

## 2016-07-16 DIAGNOSIS — F0281 Dementia in other diseases classified elsewhere with behavioral disturbance: Secondary | ICD-10-CM | POA: Diagnosis not present

## 2016-07-17 DIAGNOSIS — M6281 Muscle weakness (generalized): Secondary | ICD-10-CM | POA: Diagnosis not present

## 2016-07-17 DIAGNOSIS — F0281 Dementia in other diseases classified elsewhere with behavioral disturbance: Secondary | ICD-10-CM | POA: Diagnosis not present

## 2016-07-17 DIAGNOSIS — R41841 Cognitive communication deficit: Secondary | ICD-10-CM | POA: Diagnosis not present

## 2016-07-17 DIAGNOSIS — R488 Other symbolic dysfunctions: Secondary | ICD-10-CM | POA: Diagnosis not present

## 2016-07-17 DIAGNOSIS — N3946 Mixed incontinence: Secondary | ICD-10-CM | POA: Diagnosis not present

## 2016-07-17 DIAGNOSIS — R2681 Unsteadiness on feet: Secondary | ICD-10-CM | POA: Diagnosis not present

## 2016-07-18 DIAGNOSIS — R41841 Cognitive communication deficit: Secondary | ICD-10-CM | POA: Diagnosis not present

## 2016-07-18 DIAGNOSIS — M6281 Muscle weakness (generalized): Secondary | ICD-10-CM | POA: Diagnosis not present

## 2016-07-18 DIAGNOSIS — R2681 Unsteadiness on feet: Secondary | ICD-10-CM | POA: Diagnosis not present

## 2016-07-18 DIAGNOSIS — R488 Other symbolic dysfunctions: Secondary | ICD-10-CM | POA: Diagnosis not present

## 2016-07-18 DIAGNOSIS — F0281 Dementia in other diseases classified elsewhere with behavioral disturbance: Secondary | ICD-10-CM | POA: Diagnosis not present

## 2016-07-18 DIAGNOSIS — N3946 Mixed incontinence: Secondary | ICD-10-CM | POA: Diagnosis not present

## 2016-07-21 DIAGNOSIS — R41841 Cognitive communication deficit: Secondary | ICD-10-CM | POA: Diagnosis not present

## 2016-07-21 DIAGNOSIS — F0281 Dementia in other diseases classified elsewhere with behavioral disturbance: Secondary | ICD-10-CM | POA: Diagnosis not present

## 2016-07-21 DIAGNOSIS — R2681 Unsteadiness on feet: Secondary | ICD-10-CM | POA: Diagnosis not present

## 2016-07-21 DIAGNOSIS — N3946 Mixed incontinence: Secondary | ICD-10-CM | POA: Diagnosis not present

## 2016-07-21 DIAGNOSIS — R488 Other symbolic dysfunctions: Secondary | ICD-10-CM | POA: Diagnosis not present

## 2016-07-21 DIAGNOSIS — M6281 Muscle weakness (generalized): Secondary | ICD-10-CM | POA: Diagnosis not present

## 2016-07-23 DIAGNOSIS — R41841 Cognitive communication deficit: Secondary | ICD-10-CM | POA: Diagnosis not present

## 2016-07-23 DIAGNOSIS — R2681 Unsteadiness on feet: Secondary | ICD-10-CM | POA: Diagnosis not present

## 2016-07-23 DIAGNOSIS — M6281 Muscle weakness (generalized): Secondary | ICD-10-CM | POA: Diagnosis not present

## 2016-07-23 DIAGNOSIS — R488 Other symbolic dysfunctions: Secondary | ICD-10-CM | POA: Diagnosis not present

## 2016-07-23 DIAGNOSIS — F0281 Dementia in other diseases classified elsewhere with behavioral disturbance: Secondary | ICD-10-CM | POA: Diagnosis not present

## 2016-07-23 DIAGNOSIS — N3946 Mixed incontinence: Secondary | ICD-10-CM | POA: Diagnosis not present

## 2016-07-24 DIAGNOSIS — N3946 Mixed incontinence: Secondary | ICD-10-CM | POA: Diagnosis not present

## 2016-07-24 DIAGNOSIS — R41841 Cognitive communication deficit: Secondary | ICD-10-CM | POA: Diagnosis not present

## 2016-07-24 DIAGNOSIS — R2681 Unsteadiness on feet: Secondary | ICD-10-CM | POA: Diagnosis not present

## 2016-07-24 DIAGNOSIS — R488 Other symbolic dysfunctions: Secondary | ICD-10-CM | POA: Diagnosis not present

## 2016-07-24 DIAGNOSIS — M6281 Muscle weakness (generalized): Secondary | ICD-10-CM | POA: Diagnosis not present

## 2016-07-24 DIAGNOSIS — F0281 Dementia in other diseases classified elsewhere with behavioral disturbance: Secondary | ICD-10-CM | POA: Diagnosis not present

## 2016-07-25 DIAGNOSIS — R2681 Unsteadiness on feet: Secondary | ICD-10-CM | POA: Diagnosis not present

## 2016-07-25 DIAGNOSIS — N3946 Mixed incontinence: Secondary | ICD-10-CM | POA: Diagnosis not present

## 2016-07-25 DIAGNOSIS — R41841 Cognitive communication deficit: Secondary | ICD-10-CM | POA: Diagnosis not present

## 2016-07-25 DIAGNOSIS — M6281 Muscle weakness (generalized): Secondary | ICD-10-CM | POA: Diagnosis not present

## 2016-07-25 DIAGNOSIS — F0281 Dementia in other diseases classified elsewhere with behavioral disturbance: Secondary | ICD-10-CM | POA: Diagnosis not present

## 2016-07-25 DIAGNOSIS — R488 Other symbolic dysfunctions: Secondary | ICD-10-CM | POA: Diagnosis not present

## 2016-07-28 DIAGNOSIS — M6281 Muscle weakness (generalized): Secondary | ICD-10-CM | POA: Diagnosis not present

## 2016-07-28 DIAGNOSIS — N3946 Mixed incontinence: Secondary | ICD-10-CM | POA: Diagnosis not present

## 2016-07-28 DIAGNOSIS — R41841 Cognitive communication deficit: Secondary | ICD-10-CM | POA: Diagnosis not present

## 2016-07-28 DIAGNOSIS — F0281 Dementia in other diseases classified elsewhere with behavioral disturbance: Secondary | ICD-10-CM | POA: Diagnosis not present

## 2016-07-28 DIAGNOSIS — R488 Other symbolic dysfunctions: Secondary | ICD-10-CM | POA: Diagnosis not present

## 2016-07-28 DIAGNOSIS — R2681 Unsteadiness on feet: Secondary | ICD-10-CM | POA: Diagnosis not present

## 2016-07-30 DIAGNOSIS — F0281 Dementia in other diseases classified elsewhere with behavioral disturbance: Secondary | ICD-10-CM | POA: Diagnosis not present

## 2016-07-30 DIAGNOSIS — N3946 Mixed incontinence: Secondary | ICD-10-CM | POA: Diagnosis not present

## 2016-07-30 DIAGNOSIS — R2681 Unsteadiness on feet: Secondary | ICD-10-CM | POA: Diagnosis not present

## 2016-07-30 DIAGNOSIS — M6281 Muscle weakness (generalized): Secondary | ICD-10-CM | POA: Diagnosis not present

## 2016-07-30 DIAGNOSIS — G301 Alzheimer's disease with late onset: Secondary | ICD-10-CM | POA: Diagnosis not present

## 2016-07-30 DIAGNOSIS — R41841 Cognitive communication deficit: Secondary | ICD-10-CM | POA: Diagnosis not present

## 2016-07-30 DIAGNOSIS — Z9181 History of falling: Secondary | ICD-10-CM | POA: Diagnosis not present

## 2016-08-01 DIAGNOSIS — M6281 Muscle weakness (generalized): Secondary | ICD-10-CM | POA: Diagnosis not present

## 2016-08-01 DIAGNOSIS — R2681 Unsteadiness on feet: Secondary | ICD-10-CM | POA: Diagnosis not present

## 2016-08-01 DIAGNOSIS — G301 Alzheimer's disease with late onset: Secondary | ICD-10-CM | POA: Diagnosis not present

## 2016-08-01 DIAGNOSIS — N3946 Mixed incontinence: Secondary | ICD-10-CM | POA: Diagnosis not present

## 2016-08-01 DIAGNOSIS — R41841 Cognitive communication deficit: Secondary | ICD-10-CM | POA: Diagnosis not present

## 2016-08-01 DIAGNOSIS — F0281 Dementia in other diseases classified elsewhere with behavioral disturbance: Secondary | ICD-10-CM | POA: Diagnosis not present

## 2016-08-05 DIAGNOSIS — N3946 Mixed incontinence: Secondary | ICD-10-CM | POA: Diagnosis not present

## 2016-08-05 DIAGNOSIS — G301 Alzheimer's disease with late onset: Secondary | ICD-10-CM | POA: Diagnosis not present

## 2016-08-05 DIAGNOSIS — R2681 Unsteadiness on feet: Secondary | ICD-10-CM | POA: Diagnosis not present

## 2016-08-05 DIAGNOSIS — F0281 Dementia in other diseases classified elsewhere with behavioral disturbance: Secondary | ICD-10-CM | POA: Diagnosis not present

## 2016-08-05 DIAGNOSIS — M6281 Muscle weakness (generalized): Secondary | ICD-10-CM | POA: Diagnosis not present

## 2016-08-05 DIAGNOSIS — R41841 Cognitive communication deficit: Secondary | ICD-10-CM | POA: Diagnosis not present

## 2016-08-06 DIAGNOSIS — R2681 Unsteadiness on feet: Secondary | ICD-10-CM | POA: Diagnosis not present

## 2016-08-06 DIAGNOSIS — R41841 Cognitive communication deficit: Secondary | ICD-10-CM | POA: Diagnosis not present

## 2016-08-06 DIAGNOSIS — F0281 Dementia in other diseases classified elsewhere with behavioral disturbance: Secondary | ICD-10-CM | POA: Diagnosis not present

## 2016-08-06 DIAGNOSIS — G301 Alzheimer's disease with late onset: Secondary | ICD-10-CM | POA: Diagnosis not present

## 2016-08-06 DIAGNOSIS — N3946 Mixed incontinence: Secondary | ICD-10-CM | POA: Diagnosis not present

## 2016-08-06 DIAGNOSIS — M6281 Muscle weakness (generalized): Secondary | ICD-10-CM | POA: Diagnosis not present

## 2016-08-08 DIAGNOSIS — N3946 Mixed incontinence: Secondary | ICD-10-CM | POA: Diagnosis not present

## 2016-08-08 DIAGNOSIS — R2681 Unsteadiness on feet: Secondary | ICD-10-CM | POA: Diagnosis not present

## 2016-08-08 DIAGNOSIS — R41841 Cognitive communication deficit: Secondary | ICD-10-CM | POA: Diagnosis not present

## 2016-08-08 DIAGNOSIS — M6281 Muscle weakness (generalized): Secondary | ICD-10-CM | POA: Diagnosis not present

## 2016-08-08 DIAGNOSIS — G301 Alzheimer's disease with late onset: Secondary | ICD-10-CM | POA: Diagnosis not present

## 2016-08-08 DIAGNOSIS — F0281 Dementia in other diseases classified elsewhere with behavioral disturbance: Secondary | ICD-10-CM | POA: Diagnosis not present

## 2016-08-11 DIAGNOSIS — M6281 Muscle weakness (generalized): Secondary | ICD-10-CM | POA: Diagnosis not present

## 2016-08-11 DIAGNOSIS — R2681 Unsteadiness on feet: Secondary | ICD-10-CM | POA: Diagnosis not present

## 2016-08-11 DIAGNOSIS — F0281 Dementia in other diseases classified elsewhere with behavioral disturbance: Secondary | ICD-10-CM | POA: Diagnosis not present

## 2016-08-11 DIAGNOSIS — G301 Alzheimer's disease with late onset: Secondary | ICD-10-CM | POA: Diagnosis not present

## 2016-08-11 DIAGNOSIS — R41841 Cognitive communication deficit: Secondary | ICD-10-CM | POA: Diagnosis not present

## 2016-08-11 DIAGNOSIS — N3946 Mixed incontinence: Secondary | ICD-10-CM | POA: Diagnosis not present

## 2016-08-13 DIAGNOSIS — F0281 Dementia in other diseases classified elsewhere with behavioral disturbance: Secondary | ICD-10-CM | POA: Diagnosis not present

## 2016-08-13 DIAGNOSIS — M6281 Muscle weakness (generalized): Secondary | ICD-10-CM | POA: Diagnosis not present

## 2016-08-13 DIAGNOSIS — R41841 Cognitive communication deficit: Secondary | ICD-10-CM | POA: Diagnosis not present

## 2016-08-13 DIAGNOSIS — R2681 Unsteadiness on feet: Secondary | ICD-10-CM | POA: Diagnosis not present

## 2016-08-13 DIAGNOSIS — G301 Alzheimer's disease with late onset: Secondary | ICD-10-CM | POA: Diagnosis not present

## 2016-08-13 DIAGNOSIS — N3946 Mixed incontinence: Secondary | ICD-10-CM | POA: Diagnosis not present

## 2016-08-14 DIAGNOSIS — R41841 Cognitive communication deficit: Secondary | ICD-10-CM | POA: Diagnosis not present

## 2016-08-14 DIAGNOSIS — N3946 Mixed incontinence: Secondary | ICD-10-CM | POA: Diagnosis not present

## 2016-08-14 DIAGNOSIS — F0281 Dementia in other diseases classified elsewhere with behavioral disturbance: Secondary | ICD-10-CM | POA: Diagnosis not present

## 2016-08-14 DIAGNOSIS — M6281 Muscle weakness (generalized): Secondary | ICD-10-CM | POA: Diagnosis not present

## 2016-08-14 DIAGNOSIS — G301 Alzheimer's disease with late onset: Secondary | ICD-10-CM | POA: Diagnosis not present

## 2016-08-14 DIAGNOSIS — R2681 Unsteadiness on feet: Secondary | ICD-10-CM | POA: Diagnosis not present

## 2016-08-15 DIAGNOSIS — R41841 Cognitive communication deficit: Secondary | ICD-10-CM | POA: Diagnosis not present

## 2016-08-15 DIAGNOSIS — G301 Alzheimer's disease with late onset: Secondary | ICD-10-CM | POA: Diagnosis not present

## 2016-08-15 DIAGNOSIS — R2681 Unsteadiness on feet: Secondary | ICD-10-CM | POA: Diagnosis not present

## 2016-08-15 DIAGNOSIS — F0281 Dementia in other diseases classified elsewhere with behavioral disturbance: Secondary | ICD-10-CM | POA: Diagnosis not present

## 2016-08-15 DIAGNOSIS — N3946 Mixed incontinence: Secondary | ICD-10-CM | POA: Diagnosis not present

## 2016-08-15 DIAGNOSIS — M6281 Muscle weakness (generalized): Secondary | ICD-10-CM | POA: Diagnosis not present

## 2016-08-18 DIAGNOSIS — R2681 Unsteadiness on feet: Secondary | ICD-10-CM | POA: Diagnosis not present

## 2016-08-18 DIAGNOSIS — R41841 Cognitive communication deficit: Secondary | ICD-10-CM | POA: Diagnosis not present

## 2016-08-18 DIAGNOSIS — G301 Alzheimer's disease with late onset: Secondary | ICD-10-CM | POA: Diagnosis not present

## 2016-08-18 DIAGNOSIS — N3946 Mixed incontinence: Secondary | ICD-10-CM | POA: Diagnosis not present

## 2016-08-18 DIAGNOSIS — M6281 Muscle weakness (generalized): Secondary | ICD-10-CM | POA: Diagnosis not present

## 2016-08-18 DIAGNOSIS — F0281 Dementia in other diseases classified elsewhere with behavioral disturbance: Secondary | ICD-10-CM | POA: Diagnosis not present

## 2016-08-19 DIAGNOSIS — M6281 Muscle weakness (generalized): Secondary | ICD-10-CM | POA: Diagnosis not present

## 2016-08-19 DIAGNOSIS — R41841 Cognitive communication deficit: Secondary | ICD-10-CM | POA: Diagnosis not present

## 2016-08-19 DIAGNOSIS — G301 Alzheimer's disease with late onset: Secondary | ICD-10-CM | POA: Diagnosis not present

## 2016-08-19 DIAGNOSIS — R2681 Unsteadiness on feet: Secondary | ICD-10-CM | POA: Diagnosis not present

## 2016-08-19 DIAGNOSIS — F0281 Dementia in other diseases classified elsewhere with behavioral disturbance: Secondary | ICD-10-CM | POA: Diagnosis not present

## 2016-08-19 DIAGNOSIS — N3946 Mixed incontinence: Secondary | ICD-10-CM | POA: Diagnosis not present

## 2016-08-20 DIAGNOSIS — N3946 Mixed incontinence: Secondary | ICD-10-CM | POA: Diagnosis not present

## 2016-08-20 DIAGNOSIS — G301 Alzheimer's disease with late onset: Secondary | ICD-10-CM | POA: Diagnosis not present

## 2016-08-20 DIAGNOSIS — R41841 Cognitive communication deficit: Secondary | ICD-10-CM | POA: Diagnosis not present

## 2016-08-20 DIAGNOSIS — M6281 Muscle weakness (generalized): Secondary | ICD-10-CM | POA: Diagnosis not present

## 2016-08-20 DIAGNOSIS — R2681 Unsteadiness on feet: Secondary | ICD-10-CM | POA: Diagnosis not present

## 2016-08-20 DIAGNOSIS — F0281 Dementia in other diseases classified elsewhere with behavioral disturbance: Secondary | ICD-10-CM | POA: Diagnosis not present

## 2016-08-21 DIAGNOSIS — N3946 Mixed incontinence: Secondary | ICD-10-CM | POA: Diagnosis not present

## 2016-08-21 DIAGNOSIS — M6281 Muscle weakness (generalized): Secondary | ICD-10-CM | POA: Diagnosis not present

## 2016-08-21 DIAGNOSIS — G301 Alzheimer's disease with late onset: Secondary | ICD-10-CM | POA: Diagnosis not present

## 2016-08-21 DIAGNOSIS — R41841 Cognitive communication deficit: Secondary | ICD-10-CM | POA: Diagnosis not present

## 2016-08-21 DIAGNOSIS — R2681 Unsteadiness on feet: Secondary | ICD-10-CM | POA: Diagnosis not present

## 2016-08-21 DIAGNOSIS — F0281 Dementia in other diseases classified elsewhere with behavioral disturbance: Secondary | ICD-10-CM | POA: Diagnosis not present

## 2016-08-26 DIAGNOSIS — M6281 Muscle weakness (generalized): Secondary | ICD-10-CM | POA: Diagnosis not present

## 2016-08-26 DIAGNOSIS — F0281 Dementia in other diseases classified elsewhere with behavioral disturbance: Secondary | ICD-10-CM | POA: Diagnosis not present

## 2016-08-26 DIAGNOSIS — G301 Alzheimer's disease with late onset: Secondary | ICD-10-CM | POA: Diagnosis not present

## 2016-08-26 DIAGNOSIS — R41841 Cognitive communication deficit: Secondary | ICD-10-CM | POA: Diagnosis not present

## 2016-08-26 DIAGNOSIS — N3946 Mixed incontinence: Secondary | ICD-10-CM | POA: Diagnosis not present

## 2016-08-26 DIAGNOSIS — R2681 Unsteadiness on feet: Secondary | ICD-10-CM | POA: Diagnosis not present

## 2016-08-27 DIAGNOSIS — M6281 Muscle weakness (generalized): Secondary | ICD-10-CM | POA: Diagnosis not present

## 2016-08-27 DIAGNOSIS — R2681 Unsteadiness on feet: Secondary | ICD-10-CM | POA: Diagnosis not present

## 2016-08-27 DIAGNOSIS — F0281 Dementia in other diseases classified elsewhere with behavioral disturbance: Secondary | ICD-10-CM | POA: Diagnosis not present

## 2016-08-27 DIAGNOSIS — R41841 Cognitive communication deficit: Secondary | ICD-10-CM | POA: Diagnosis not present

## 2016-08-27 DIAGNOSIS — G301 Alzheimer's disease with late onset: Secondary | ICD-10-CM | POA: Diagnosis not present

## 2016-08-27 DIAGNOSIS — N3946 Mixed incontinence: Secondary | ICD-10-CM | POA: Diagnosis not present

## 2016-08-28 DIAGNOSIS — N3946 Mixed incontinence: Secondary | ICD-10-CM | POA: Diagnosis not present

## 2016-08-28 DIAGNOSIS — R41841 Cognitive communication deficit: Secondary | ICD-10-CM | POA: Diagnosis not present

## 2016-08-28 DIAGNOSIS — R2681 Unsteadiness on feet: Secondary | ICD-10-CM | POA: Diagnosis not present

## 2016-08-28 DIAGNOSIS — M6281 Muscle weakness (generalized): Secondary | ICD-10-CM | POA: Diagnosis not present

## 2016-08-28 DIAGNOSIS — G301 Alzheimer's disease with late onset: Secondary | ICD-10-CM | POA: Diagnosis not present

## 2016-08-28 DIAGNOSIS — F0281 Dementia in other diseases classified elsewhere with behavioral disturbance: Secondary | ICD-10-CM | POA: Diagnosis not present

## 2016-09-02 DIAGNOSIS — Z9181 History of falling: Secondary | ICD-10-CM | POA: Diagnosis not present

## 2016-09-02 DIAGNOSIS — R2681 Unsteadiness on feet: Secondary | ICD-10-CM | POA: Diagnosis not present

## 2016-09-02 DIAGNOSIS — N3946 Mixed incontinence: Secondary | ICD-10-CM | POA: Diagnosis not present

## 2016-09-02 DIAGNOSIS — M6281 Muscle weakness (generalized): Secondary | ICD-10-CM | POA: Diagnosis not present

## 2016-09-03 DIAGNOSIS — M6281 Muscle weakness (generalized): Secondary | ICD-10-CM | POA: Diagnosis not present

## 2016-09-03 DIAGNOSIS — N3946 Mixed incontinence: Secondary | ICD-10-CM | POA: Diagnosis not present

## 2016-09-03 DIAGNOSIS — R2681 Unsteadiness on feet: Secondary | ICD-10-CM | POA: Diagnosis not present

## 2016-09-03 DIAGNOSIS — Z9181 History of falling: Secondary | ICD-10-CM | POA: Diagnosis not present

## 2016-09-04 DIAGNOSIS — R2681 Unsteadiness on feet: Secondary | ICD-10-CM | POA: Diagnosis not present

## 2016-09-04 DIAGNOSIS — N3946 Mixed incontinence: Secondary | ICD-10-CM | POA: Diagnosis not present

## 2016-09-04 DIAGNOSIS — M6281 Muscle weakness (generalized): Secondary | ICD-10-CM | POA: Diagnosis not present

## 2016-09-04 DIAGNOSIS — Z9181 History of falling: Secondary | ICD-10-CM | POA: Diagnosis not present

## 2016-09-05 DIAGNOSIS — N3946 Mixed incontinence: Secondary | ICD-10-CM | POA: Diagnosis not present

## 2016-09-05 DIAGNOSIS — M6281 Muscle weakness (generalized): Secondary | ICD-10-CM | POA: Diagnosis not present

## 2016-09-05 DIAGNOSIS — Z9181 History of falling: Secondary | ICD-10-CM | POA: Diagnosis not present

## 2016-09-05 DIAGNOSIS — R2681 Unsteadiness on feet: Secondary | ICD-10-CM | POA: Diagnosis not present

## 2016-09-08 DIAGNOSIS — Z9181 History of falling: Secondary | ICD-10-CM | POA: Diagnosis not present

## 2016-09-08 DIAGNOSIS — M6281 Muscle weakness (generalized): Secondary | ICD-10-CM | POA: Diagnosis not present

## 2016-09-08 DIAGNOSIS — R2681 Unsteadiness on feet: Secondary | ICD-10-CM | POA: Diagnosis not present

## 2016-09-08 DIAGNOSIS — N3946 Mixed incontinence: Secondary | ICD-10-CM | POA: Diagnosis not present

## 2016-09-09 DIAGNOSIS — N3946 Mixed incontinence: Secondary | ICD-10-CM | POA: Diagnosis not present

## 2016-09-09 DIAGNOSIS — Z9181 History of falling: Secondary | ICD-10-CM | POA: Diagnosis not present

## 2016-09-09 DIAGNOSIS — R2681 Unsteadiness on feet: Secondary | ICD-10-CM | POA: Diagnosis not present

## 2016-09-09 DIAGNOSIS — M6281 Muscle weakness (generalized): Secondary | ICD-10-CM | POA: Diagnosis not present

## 2016-09-10 DIAGNOSIS — M6281 Muscle weakness (generalized): Secondary | ICD-10-CM | POA: Diagnosis not present

## 2016-09-10 DIAGNOSIS — R2681 Unsteadiness on feet: Secondary | ICD-10-CM | POA: Diagnosis not present

## 2016-09-10 DIAGNOSIS — N3946 Mixed incontinence: Secondary | ICD-10-CM | POA: Diagnosis not present

## 2016-09-10 DIAGNOSIS — Z9181 History of falling: Secondary | ICD-10-CM | POA: Diagnosis not present

## 2016-09-12 DIAGNOSIS — N3946 Mixed incontinence: Secondary | ICD-10-CM | POA: Diagnosis not present

## 2016-09-12 DIAGNOSIS — M6281 Muscle weakness (generalized): Secondary | ICD-10-CM | POA: Diagnosis not present

## 2016-09-12 DIAGNOSIS — Z9181 History of falling: Secondary | ICD-10-CM | POA: Diagnosis not present

## 2016-09-12 DIAGNOSIS — R2681 Unsteadiness on feet: Secondary | ICD-10-CM | POA: Diagnosis not present

## 2016-09-15 DIAGNOSIS — N3946 Mixed incontinence: Secondary | ICD-10-CM | POA: Diagnosis not present

## 2016-09-15 DIAGNOSIS — R2681 Unsteadiness on feet: Secondary | ICD-10-CM | POA: Diagnosis not present

## 2016-09-15 DIAGNOSIS — M6281 Muscle weakness (generalized): Secondary | ICD-10-CM | POA: Diagnosis not present

## 2016-09-15 DIAGNOSIS — Z9181 History of falling: Secondary | ICD-10-CM | POA: Diagnosis not present

## 2016-09-17 DIAGNOSIS — M6281 Muscle weakness (generalized): Secondary | ICD-10-CM | POA: Diagnosis not present

## 2016-09-17 DIAGNOSIS — N3946 Mixed incontinence: Secondary | ICD-10-CM | POA: Diagnosis not present

## 2016-09-17 DIAGNOSIS — Z9181 History of falling: Secondary | ICD-10-CM | POA: Diagnosis not present

## 2016-09-17 DIAGNOSIS — R2681 Unsteadiness on feet: Secondary | ICD-10-CM | POA: Diagnosis not present

## 2016-09-22 DIAGNOSIS — R2681 Unsteadiness on feet: Secondary | ICD-10-CM | POA: Diagnosis not present

## 2016-09-22 DIAGNOSIS — M6281 Muscle weakness (generalized): Secondary | ICD-10-CM | POA: Diagnosis not present

## 2016-09-22 DIAGNOSIS — N3946 Mixed incontinence: Secondary | ICD-10-CM | POA: Diagnosis not present

## 2016-09-22 DIAGNOSIS — Z9181 History of falling: Secondary | ICD-10-CM | POA: Diagnosis not present

## 2016-09-24 DIAGNOSIS — Z9181 History of falling: Secondary | ICD-10-CM | POA: Diagnosis not present

## 2016-09-24 DIAGNOSIS — N3946 Mixed incontinence: Secondary | ICD-10-CM | POA: Diagnosis not present

## 2016-09-24 DIAGNOSIS — R2681 Unsteadiness on feet: Secondary | ICD-10-CM | POA: Diagnosis not present

## 2016-09-24 DIAGNOSIS — M6281 Muscle weakness (generalized): Secondary | ICD-10-CM | POA: Diagnosis not present

## 2016-09-25 DIAGNOSIS — N3946 Mixed incontinence: Secondary | ICD-10-CM | POA: Diagnosis not present

## 2016-09-25 DIAGNOSIS — R2681 Unsteadiness on feet: Secondary | ICD-10-CM | POA: Diagnosis not present

## 2016-09-25 DIAGNOSIS — M6281 Muscle weakness (generalized): Secondary | ICD-10-CM | POA: Diagnosis not present

## 2016-09-25 DIAGNOSIS — Z9181 History of falling: Secondary | ICD-10-CM | POA: Diagnosis not present

## 2016-09-26 DIAGNOSIS — M6281 Muscle weakness (generalized): Secondary | ICD-10-CM | POA: Diagnosis not present

## 2016-09-26 DIAGNOSIS — R2681 Unsteadiness on feet: Secondary | ICD-10-CM | POA: Diagnosis not present

## 2016-09-26 DIAGNOSIS — Z9181 History of falling: Secondary | ICD-10-CM | POA: Diagnosis not present

## 2016-09-26 DIAGNOSIS — N3946 Mixed incontinence: Secondary | ICD-10-CM | POA: Diagnosis not present

## 2016-09-29 DIAGNOSIS — Z9181 History of falling: Secondary | ICD-10-CM | POA: Diagnosis not present

## 2016-09-29 DIAGNOSIS — R2681 Unsteadiness on feet: Secondary | ICD-10-CM | POA: Diagnosis not present

## 2016-09-29 DIAGNOSIS — M6281 Muscle weakness (generalized): Secondary | ICD-10-CM | POA: Diagnosis not present

## 2016-09-30 DIAGNOSIS — M6281 Muscle weakness (generalized): Secondary | ICD-10-CM | POA: Diagnosis not present

## 2016-09-30 DIAGNOSIS — R2681 Unsteadiness on feet: Secondary | ICD-10-CM | POA: Diagnosis not present

## 2016-09-30 DIAGNOSIS — Z9181 History of falling: Secondary | ICD-10-CM | POA: Diagnosis not present

## 2016-10-02 ENCOUNTER — Encounter: Payer: Self-pay | Admitting: Nurse Practitioner

## 2016-10-02 ENCOUNTER — Ambulatory Visit: Payer: Medicare Other | Admitting: Nurse Practitioner

## 2016-10-02 ENCOUNTER — Ambulatory Visit (INDEPENDENT_AMBULATORY_CARE_PROVIDER_SITE_OTHER): Payer: Medicare Other | Admitting: Nurse Practitioner

## 2016-10-02 VITALS — BP 120/64 | HR 75 | Ht 69.0 in | Wt 169.0 lb

## 2016-10-02 DIAGNOSIS — G308 Other Alzheimer's disease: Secondary | ICD-10-CM

## 2016-10-02 DIAGNOSIS — G309 Alzheimer's disease, unspecified: Principal | ICD-10-CM

## 2016-10-02 DIAGNOSIS — F0281 Dementia in other diseases classified elsewhere with behavioral disturbance: Secondary | ICD-10-CM
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
## 2016-10-02 NOTE — Patient Instructions (Addendum)
Patient did not tolerate Exelon, Namenda or Aricept. Independent setting seems adequate at present with supervision Continue Physical therapy use walker for safe ambulation Continue activities for socialization Follow up in 6 to 8 months

## 2016-10-03 ENCOUNTER — Encounter: Payer: Self-pay | Admitting: *Deleted

## 2016-10-07 DIAGNOSIS — R2681 Unsteadiness on feet: Secondary | ICD-10-CM | POA: Diagnosis not present

## 2016-10-07 DIAGNOSIS — M6281 Muscle weakness (generalized): Secondary | ICD-10-CM | POA: Diagnosis not present

## 2016-10-07 DIAGNOSIS — Z9181 History of falling: Secondary | ICD-10-CM | POA: Diagnosis not present

## 2016-10-08 DIAGNOSIS — M6281 Muscle weakness (generalized): Secondary | ICD-10-CM | POA: Diagnosis not present

## 2016-10-08 DIAGNOSIS — Z9181 History of falling: Secondary | ICD-10-CM | POA: Diagnosis not present

## 2016-10-08 DIAGNOSIS — R2681 Unsteadiness on feet: Secondary | ICD-10-CM | POA: Diagnosis not present

## 2016-10-09 DIAGNOSIS — M6281 Muscle weakness (generalized): Secondary | ICD-10-CM | POA: Diagnosis not present

## 2016-10-09 DIAGNOSIS — R2681 Unsteadiness on feet: Secondary | ICD-10-CM | POA: Diagnosis not present

## 2016-10-09 DIAGNOSIS — Z9181 History of falling: Secondary | ICD-10-CM | POA: Diagnosis not present

## 2016-10-13 DIAGNOSIS — M6281 Muscle weakness (generalized): Secondary | ICD-10-CM | POA: Diagnosis not present

## 2016-10-13 DIAGNOSIS — R2681 Unsteadiness on feet: Secondary | ICD-10-CM | POA: Diagnosis not present

## 2016-10-13 DIAGNOSIS — Z9181 History of falling: Secondary | ICD-10-CM | POA: Diagnosis not present

## 2016-10-14 DIAGNOSIS — L603 Nail dystrophy: Secondary | ICD-10-CM | POA: Diagnosis not present

## 2016-10-14 DIAGNOSIS — E1151 Type 2 diabetes mellitus with diabetic peripheral angiopathy without gangrene: Secondary | ICD-10-CM | POA: Diagnosis not present

## 2016-10-14 DIAGNOSIS — I739 Peripheral vascular disease, unspecified: Secondary | ICD-10-CM | POA: Diagnosis not present

## 2016-10-16 DIAGNOSIS — M6281 Muscle weakness (generalized): Secondary | ICD-10-CM | POA: Diagnosis not present

## 2016-10-16 DIAGNOSIS — R2681 Unsteadiness on feet: Secondary | ICD-10-CM | POA: Diagnosis not present

## 2016-10-16 DIAGNOSIS — Z9181 History of falling: Secondary | ICD-10-CM | POA: Diagnosis not present

## 2016-10-20 DIAGNOSIS — Z780 Asymptomatic menopausal state: Secondary | ICD-10-CM | POA: Diagnosis not present

## 2016-10-20 DIAGNOSIS — Z13820 Encounter for screening for osteoporosis: Secondary | ICD-10-CM | POA: Diagnosis not present

## 2016-10-20 DIAGNOSIS — M8588 Other specified disorders of bone density and structure, other site: Secondary | ICD-10-CM | POA: Diagnosis not present

## 2016-10-21 DIAGNOSIS — Z9181 History of falling: Secondary | ICD-10-CM | POA: Diagnosis not present

## 2016-10-21 DIAGNOSIS — M6281 Muscle weakness (generalized): Secondary | ICD-10-CM | POA: Diagnosis not present

## 2016-10-21 DIAGNOSIS — R2681 Unsteadiness on feet: Secondary | ICD-10-CM | POA: Diagnosis not present

## 2016-10-22 DIAGNOSIS — R2681 Unsteadiness on feet: Secondary | ICD-10-CM | POA: Diagnosis not present

## 2016-10-22 DIAGNOSIS — M6281 Muscle weakness (generalized): Secondary | ICD-10-CM | POA: Diagnosis not present

## 2016-10-22 DIAGNOSIS — Z9181 History of falling: Secondary | ICD-10-CM | POA: Diagnosis not present

## 2016-10-28 DIAGNOSIS — M85861 Other specified disorders of bone density and structure, right lower leg: Secondary | ICD-10-CM | POA: Diagnosis not present

## 2016-10-28 DIAGNOSIS — F325 Major depressive disorder, single episode, in full remission: Secondary | ICD-10-CM | POA: Diagnosis not present

## 2016-10-28 DIAGNOSIS — Z23 Encounter for immunization: Secondary | ICD-10-CM | POA: Diagnosis not present

## 2016-10-28 DIAGNOSIS — I872 Venous insufficiency (chronic) (peripheral): Secondary | ICD-10-CM | POA: Diagnosis not present

## 2016-10-30 NOTE — Progress Notes (Signed)
Personally have participated in and made any corrections needed to history, physical, neuro exam,assessment and plan as stated above.    Livana Yerian, MD [facility] 

## 2016-12-10 DIAGNOSIS — E119 Type 2 diabetes mellitus without complications: Secondary | ICD-10-CM | POA: Diagnosis not present

## 2016-12-10 DIAGNOSIS — Z7984 Long term (current) use of oral hypoglycemic drugs: Secondary | ICD-10-CM | POA: Diagnosis not present

## 2016-12-10 DIAGNOSIS — Z79899 Other long term (current) drug therapy: Secondary | ICD-10-CM | POA: Diagnosis not present

## 2016-12-10 DIAGNOSIS — G309 Alzheimer's disease, unspecified: Secondary | ICD-10-CM | POA: Diagnosis not present

## 2017-01-22 DIAGNOSIS — R42 Dizziness and giddiness: Secondary | ICD-10-CM | POA: Diagnosis not present

## 2017-01-24 DIAGNOSIS — N390 Urinary tract infection, site not specified: Secondary | ICD-10-CM | POA: Diagnosis not present

## 2017-01-25 ENCOUNTER — Observation Stay (HOSPITAL_COMMUNITY)
Admission: EM | Admit: 2017-01-25 | Discharge: 2017-01-26 | Disposition: A | Discharge status: Home / Self Care | Payer: Medicare Other | Attending: Family Medicine | Admitting: Family Medicine

## 2017-01-25 ENCOUNTER — Emergency Department (HOSPITAL_COMMUNITY): Payer: Medicare Other

## 2017-01-25 ENCOUNTER — Encounter (HOSPITAL_COMMUNITY): Payer: Self-pay | Admitting: Emergency Medicine

## 2017-01-25 DIAGNOSIS — J09X2 Influenza due to identified novel influenza A virus with other respiratory manifestations: Principal | ICD-10-CM | POA: Insufficient documentation

## 2017-01-25 DIAGNOSIS — Z87891 Personal history of nicotine dependence: Secondary | ICD-10-CM | POA: Insufficient documentation

## 2017-01-25 DIAGNOSIS — J101 Influenza due to other identified influenza virus with other respiratory manifestations: Secondary | ICD-10-CM | POA: Diagnosis not present

## 2017-01-25 DIAGNOSIS — E1149 Type 2 diabetes mellitus with other diabetic neurological complication: Secondary | ICD-10-CM | POA: Diagnosis present

## 2017-01-25 DIAGNOSIS — J449 Chronic obstructive pulmonary disease, unspecified: Secondary | ICD-10-CM | POA: Diagnosis not present

## 2017-01-25 DIAGNOSIS — E1122 Type 2 diabetes mellitus with diabetic chronic kidney disease: Secondary | ICD-10-CM | POA: Diagnosis not present

## 2017-01-25 DIAGNOSIS — F0281 Dementia in other diseases classified elsewhere with behavioral disturbance: Secondary | ICD-10-CM | POA: Diagnosis present

## 2017-01-25 DIAGNOSIS — E1140 Type 2 diabetes mellitus with diabetic neuropathy, unspecified: Secondary | ICD-10-CM | POA: Diagnosis not present

## 2017-01-25 DIAGNOSIS — N189 Chronic kidney disease, unspecified: Secondary | ICD-10-CM | POA: Diagnosis not present

## 2017-01-25 DIAGNOSIS — G309 Alzheimer's disease, unspecified: Secondary | ICD-10-CM | POA: Diagnosis present

## 2017-01-25 DIAGNOSIS — Z950 Presence of cardiac pacemaker: Secondary | ICD-10-CM | POA: Insufficient documentation

## 2017-01-25 DIAGNOSIS — Z7984 Long term (current) use of oral hypoglycemic drugs: Secondary | ICD-10-CM | POA: Diagnosis not present

## 2017-01-25 DIAGNOSIS — R509 Fever, unspecified: Secondary | ICD-10-CM | POA: Diagnosis present

## 2017-01-25 DIAGNOSIS — R05 Cough: Secondary | ICD-10-CM | POA: Diagnosis not present

## 2017-01-25 DIAGNOSIS — R0602 Shortness of breath: Secondary | ICD-10-CM | POA: Diagnosis not present

## 2017-01-25 DIAGNOSIS — E871 Hypo-osmolality and hyponatremia: Secondary | ICD-10-CM

## 2017-01-25 LAB — INFLUENZA PANEL BY PCR (TYPE A & B)
INFLAPCR: POSITIVE — AB
INFLBPCR: NEGATIVE

## 2017-01-25 LAB — CBC WITH DIFFERENTIAL/PLATELET
Basophils Absolute: 0 10*3/uL (ref 0.0–0.1)
Basophils Relative: 1 %
EOS ABS: 0 10*3/uL (ref 0.0–0.7)
EOS PCT: 0 %
HCT: 33.8 % — ABNORMAL LOW (ref 36.0–46.0)
Hemoglobin: 12.1 g/dL (ref 12.0–15.0)
LYMPHS ABS: 0.9 10*3/uL (ref 0.7–4.0)
Lymphocytes Relative: 15 %
MCH: 30.6 pg (ref 26.0–34.0)
MCHC: 35.8 g/dL (ref 30.0–36.0)
MCV: 85.4 fL (ref 78.0–100.0)
Monocytes Absolute: 0.7 10*3/uL (ref 0.1–1.0)
Monocytes Relative: 12 %
Neutro Abs: 4.4 10*3/uL (ref 1.7–7.7)
Neutrophils Relative %: 72 %
PLATELETS: 188 10*3/uL (ref 150–400)
RBC: 3.96 MIL/uL (ref 3.87–5.11)
RDW: 12.2 % (ref 11.5–15.5)
WBC: 6 10*3/uL (ref 4.0–10.5)

## 2017-01-25 LAB — URINALYSIS, ROUTINE W REFLEX MICROSCOPIC
Bacteria, UA: NONE SEEN
Bilirubin Urine: NEGATIVE
Hgb urine dipstick: NEGATIVE
KETONES UR: NEGATIVE mg/dL
Leukocytes, UA: NEGATIVE
Nitrite: NEGATIVE
PH: 5 (ref 5.0–8.0)
Protein, ur: NEGATIVE mg/dL
SPECIFIC GRAVITY, URINE: 1.016 (ref 1.005–1.030)
Squamous Epithelial / LPF: NONE SEEN

## 2017-01-25 LAB — GLUCOSE, CAPILLARY
GLUCOSE-CAPILLARY: 135 mg/dL — AB (ref 65–99)
Glucose-Capillary: 185 mg/dL — ABNORMAL HIGH (ref 65–99)

## 2017-01-25 LAB — I-STAT TROPONIN, ED: Troponin i, poc: 0.02 ng/mL (ref 0.00–0.08)

## 2017-01-25 LAB — COMPREHENSIVE METABOLIC PANEL
ALT: 19 U/L (ref 14–54)
AST: 34 U/L (ref 15–41)
Albumin: 3.9 g/dL (ref 3.5–5.0)
Alkaline Phosphatase: 55 U/L (ref 38–126)
Anion gap: 9 (ref 5–15)
BILIRUBIN TOTAL: 0.9 mg/dL (ref 0.3–1.2)
BUN: 12 mg/dL (ref 6–20)
CHLORIDE: 93 mmol/L — AB (ref 101–111)
CO2: 24 mmol/L (ref 22–32)
Calcium: 8.7 mg/dL — ABNORMAL LOW (ref 8.9–10.3)
Creatinine, Ser: 0.75 mg/dL (ref 0.44–1.00)
GFR calc Af Amer: >60 mL/min (ref >60)
Glucose, Bld: 179 mg/dL — ABNORMAL HIGH (ref 65–99)
Potassium: 4.5 mmol/L (ref 3.5–5.1)
Sodium: 126 mmol/L — ABNORMAL LOW (ref 135–145)
TOTAL PROTEIN: 6.7 g/dL (ref 6.5–8.1)

## 2017-01-25 LAB — I-STAT CG4 LACTIC ACID, ED
LACTIC ACID, VENOUS: 0.87 mmol/L (ref 0.5–1.9)
Lactic Acid, Venous: 0.95 mmol/L (ref 0.5–1.9)

## 2017-01-25 LAB — MRSA PCR SCREENING: MRSA BY PCR: INVALID — AB

## 2017-01-25 MED ORDER — IBUPROFEN 200 MG PO TABS
600.0000 mg | ORAL_TABLET | Freq: Three times a day (TID) | ORAL | Status: DC | PRN
  Administered 2017-01-25 – 2017-01-26 (×2): 600 mg via ORAL
  Filled 2017-01-25 (×2): qty 3

## 2017-01-25 MED ORDER — IPRATROPIUM-ALBUTEROL 0.5-2.5 (3) MG/3ML IN SOLN
3.0000 mL | Freq: Two times a day (BID) | RESPIRATORY_TRACT | Status: DC
  Administered 2017-01-25: 3 mL via RESPIRATORY_TRACT
  Filled 2017-01-25: qty 3

## 2017-01-25 MED ORDER — SODIUM CHLORIDE 0.9 % IV SOLN
INTRAVENOUS | Status: DC
  Administered 2017-01-25 (×2): 1000 mL via INTRAVENOUS

## 2017-01-25 MED ORDER — FLUTICASONE FUROATE-VILANTEROL 200-25 MCG/INH IN AEPB
1.0000 | INHALATION_SPRAY | Freq: Every day | RESPIRATORY_TRACT | Status: DC
  Administered 2017-01-26: 1 via RESPIRATORY_TRACT
  Filled 2017-01-25: qty 28

## 2017-01-25 MED ORDER — BUPROPION HCL 75 MG PO TABS
75.0000 mg | ORAL_TABLET | Freq: Two times a day (BID) | ORAL | Status: DC
  Administered 2017-01-25 – 2017-01-26 (×3): 75 mg via ORAL
  Filled 2017-01-25 (×3): qty 1

## 2017-01-25 MED ORDER — IPRATROPIUM-ALBUTEROL 0.5-2.5 (3) MG/3ML IN SOLN
3.0000 mL | Freq: Once | RESPIRATORY_TRACT | Status: AC
  Administered 2017-01-25: 3 mL via RESPIRATORY_TRACT
  Filled 2017-01-25: qty 3

## 2017-01-25 MED ORDER — INSULIN ASPART 100 UNIT/ML ~~LOC~~ SOLN: 0.0000 [IU] | Freq: Every day | SUBCUTANEOUS | Status: DC

## 2017-01-25 MED ORDER — INSULIN ASPART 100 UNIT/ML ~~LOC~~ SOLN
0.0000 [IU] | Freq: Three times a day (TID) | SUBCUTANEOUS | Status: DC
  Administered 2017-01-25: 1 [IU] via SUBCUTANEOUS
  Administered 2017-01-26: 2 [IU] via SUBCUTANEOUS

## 2017-01-25 MED ORDER — OSELTAMIVIR PHOSPHATE 30 MG PO CAPS
30.0000 mg | ORAL_CAPSULE | Freq: Two times a day (BID) | ORAL | Status: DC
  Administered 2017-01-25 – 2017-01-26 (×2): 30 mg via ORAL
  Filled 2017-01-25 (×2): qty 1

## 2017-01-25 MED ORDER — SODIUM CHLORIDE 0.9 % IV BOLUS (SEPSIS)
500.0000 mL | Freq: Once | INTRAVENOUS | Status: AC
  Administered 2017-01-25: 500 mL via INTRAVENOUS

## 2017-01-25 MED ORDER — LORATADINE 10 MG PO TABS
10.0000 mg | ORAL_TABLET | Freq: Every day | ORAL | Status: DC
  Administered 2017-01-26: 10 mg via ORAL
  Filled 2017-01-25: qty 1

## 2017-01-25 MED ORDER — ENOXAPARIN SODIUM 40 MG/0.4ML ~~LOC~~ SOLN
40.0000 mg | SUBCUTANEOUS | Status: DC
  Administered 2017-01-25: 40 mg via SUBCUTANEOUS
  Filled 2017-01-25: qty 0.4

## 2017-01-25 MED ORDER — OSELTAMIVIR PHOSPHATE 75 MG PO CAPS
75.0000 mg | ORAL_CAPSULE | Freq: Every day | ORAL | Status: DC
  Administered 2017-01-25: 75 mg via ORAL
  Filled 2017-01-25: qty 1

## 2017-01-25 NOTE — Progress Notes (Addendum)
Nursing Note: Call from lab,pcr[ mrsa]  not definitive,will send out for further testing and result will take a few days.wbb

## 2017-01-25 NOTE — H&P (Signed)
History and Physical   Mindie Herlong [id number] DOB: 1934-02-06 DOA: 01/25/2017  Referring MD/NP/PA: Dr. Laverta Baltimore, EDP PCP: Mathews Argyle, MD  Patient coming from: [facility]  Chief Complaint: Weakness.   HPI: Melissa Fischer is a 83 y.o. female with a history of alzheimer's dementia, COPD, T2DM, PPM, and hyponatremia who presented to [facility] for evaluation of generalized weakness and URI symptoms.   History obtained mostly from daughter due to patient's confusion secondary to dementia. She reports about a week of worsening weakness/fatigue associated with cough that is intermittent, increasing in frequency, and nonproductive. She's developed a sore throat and mild sputum as well as moderate myalgias over the past 24 hours. These symptoms prompted PCP visit where UTI was diagnosed and cipro was started. She denied trouble breathing and has not needed breathing treatments PTA. She's had no abdominal pain, nausea, vomiting or diarrhea but has not been eating very much despite taking plenty of fluids.  ED Course: On arrival, oral temperature was 99.23F, HR 106, BP 161/87, 97% on room air. CXR without infiltrate and flu swab positive for influenza A. Urinalysis negative. Lactate and troponin negative. Sodium was noted to be 126, down from a baseline of the low 130's. She appeared significantly weak, prompting TRH to be called for observation.  Review of Systems: No fevers, chest pain, palpitations, abd pain, N/V/D, dysuria, and per HPI. All others reviewed and are negative.   Past Medical History:  Diagnosis Date  . Alzheimer disease   . Bronchitis   . COPD (chronic obstructive pulmonary disease) ([place])   . Depression   . Diabetes ([place])   . Hypercholesteremia   . Memory loss   . Pacemaker    Medtronic DOI 2011  . Peripheral neuropathy ([place])   . Renal disease   . Shoulder pain, left   . Syncope     Past Surgical History:  Procedure Laterality Date  . HIP PINNING,CANNULATED Right  04/26/2016   Procedure: CANNULATED HIP PINNING;  Surgeon: Tania Ade, MD;  Location: [facility];  Service: Orthopedics;  Laterality: Right;  . no surgical history    . PACEMAKER PLACEMENT     Lives in ALF, has caretaker daily.  reports that she quit smoking about 42 years ago. She has never used smokeless tobacco. She reports that she drinks alcohol. She reports that she does not use drugs.  Allergies  Allergen Reactions  . Aricept [Donepezil]     Leg cramps  . Exelon [Rivastigmine] Other (See Comments)    dizziness and sad  . Namenda [Memantine]     dizziness  . Penicillins     "pt does not know its been so long"  . Statins Other (See Comments)    unknown  . Sulfa Antibiotics     Family History  Problem Relation Age of Onset  . Diabetes Mother   . Dementia Neg Hx    - Family history otherwise reviewed and not pertinent.  Prior to Admission medications   Medication Sig Start Date End Date Taking? Authorizing Provider  alendronate (FOSAMAX) 70 MG tablet Take 70 mg by mouth every Monday. Take with a full glass of water on an empty stomach.   Yes Historical Provider, MD  buPROPion (WELLBUTRIN) 75 MG tablet Take 75 mg by mouth 2 (two) times daily.    Yes Historical Provider, MD  calcium-vitamin D (OSCAL WITH D) 250-125 MG-UNIT tablet Take 1 tablet by mouth daily.    Yes Historical Provider, MD  ciprofloxacin (CIPRO) 500 MG tablet Take  500 mg by mouth 2 (two) times daily.  01/24/17  Yes Historical Provider, MD  fexofenadine (ALLEGRA) 180 MG tablet Take 180 mg by mouth daily.    Yes Historical Provider, MD  Fluticasone-Salmeterol (ADVAIR) 250-50 MCG/DOSE AEPB Inhale 1 puff into the lungs 2 (two) times daily.   Yes Historical Provider, MD  metFORMIN (GLUCOPHAGE-XR) 500 MG 24 hr tablet Take 500 mg by mouth daily. 01/23/16  Yes Historical Provider, MD  Multiple Vitamin (MULTIVITAMIN WITH MINERALS) TABS tablet Take 1 tablet by mouth daily.   Yes Historical Provider, MD    Physical  Exam: Vitals:   01/25/17 1052 01/25/17 1203 01/25/17 1205 01/25/17 1210  BP: 153/86   (!) 158/94  Pulse: 94   (!) 109  Resp: 26   (!) 22  Temp: 98.9 F (37.2 C)     TempSrc: Oral     SpO2: 99%   99%  Weight:  76.7 kg (169 lb 1.5 oz) 76.9 kg (169 lb 9.6 oz)   Height:  5' 8.9" (1.75 m) 5\' 8"  (1.727 m)    Constitutional: Elderly, alert but confused female in no distress, calm demeanor Eyes: Lids and conjunctivae normal, PERRL ENMT: Mucous membranes are tacky. Posterior pharynx clear of any exudate or lesions. Poor dentition.  Neck: normal, supple, no masses, no thyromegaly Respiratory: Non-labored breathing on room air without accessory muscle use. Mixed rhonchi toward the end of expiration throughout. No crackles. Cardiovascular: Regular rate and rhythm, no murmurs, rubs, or gallops. No carotid bruits. No JVD. No LE edema. 2+ pedal pulses. Abdomen: Normoactive bowel sounds. No tenderness, non-distended, and no masses palpated. No hepatosplenomegaly. GU: No indwelling catheter Musculoskeletal: No clubbing / cyanosis. No joint deformity upper and lower extremities. Good ROM, no contractures. Normal muscle tone.  Skin: Warm, dry. No rashes, wounds, no ulcers. No significant lesions noted.  Neurologic: CN II-XII grossly intact. Gait not assessed. No aphasia. Diffusely weak, requiring significant assistance just to sit up for posterior pulmonary/back exam. No focal deficits in motor strength or sensation in all extremities.  Psychiatric: Alert and oriented only to person. Mood anxious with congruent affect.   Labs on Admission: I have personally reviewed following labs and imaging studies  CBC:  Recent Labs Lab 01/25/17 0530  WBC 6.0  NEUTROABS 4.4  HGB 12.1  HCT 33.8*  MCV 85.4  PLT 0000000   Basic Metabolic Panel:  Recent Labs Lab 01/25/17 0530  NA 126*  K 4.5  CL 93*  CO2 24  GLUCOSE 179*  BUN 12  CREATININE 0.75  CALCIUM 8.7*   GFR: Estimated Creatinine Clearance: 61.2  mL/min (by C-G formula based on SCr of 0.75 mg/dL). Liver Function Tests:  Recent Labs Lab 01/25/17 0530  AST 34  ALT 19  ALKPHOS 55  BILITOT 0.9  PROT 6.7  ALBUMIN 3.9   Urine analysis:    Component Value Date/Time   COLORURINE YELLOW 01/25/2017 0730   APPEARANCEUR CLEAR 01/25/2017 0730   LABSPEC 1.016 01/25/2017 0730   PHURINE 5.0 01/25/2017 0730   GLUCOSEU >=500 (A) 01/25/2017 0730   HGBUR NEGATIVE 01/25/2017 0730   BILIRUBINUR NEGATIVE 01/25/2017 0730   KETONESUR NEGATIVE 01/25/2017 0730   PROTEINUR NEGATIVE 01/25/2017 0730   NITRITE NEGATIVE 01/25/2017 0730   LEUKOCYTESUR NEGATIVE 01/25/2017 0730   Sepsis Labs: @LABRCNTIP (procalcitonin:4,lacticidven:4) )No results found for this or any previous visit (from the past 240 hour(s)).   Radiological Exams on Admission: Dg Chest 2 View  Result Date: 01/25/2017 CLINICAL DATA:  Fever and cough EXAM:  CHEST  2 VIEW COMPARISON:  04/26/2016 FINDINGS: Heart size within normal limits. Dual lead pacemaker unchanged. Negative for heart failure. Lungs are clear without infiltrate effusion or mass. No change from the prior study. IMPRESSION: No active cardiopulmonary disease. Electronically Signed   By: Franchot Gallo M.D.   On: 01/25/2017 06:30    EKG: Independently reviewed. NSR, vent rate 96. Baseline wander in limb leads, single ectopic ventricular beat. No ST depression/elevation. QTc 463 msec  Assessment/Plan Principal Problem:   Influenza A Active Problems:   DM (diabetes mellitus) type II controlled, neurological manifestation (HCC)   CKD (chronic kidney disease)   Alzheimer's dementia with behavioral disturbance   COPD (chronic obstructive pulmonary disease) (HCC)   Influenza: Causing acute encephalopathy and diffuse weakness. Lactate normal.  - Tamiflu x5 days - Droplet precautions  Acute encephalopathy/weakness: More confused than baseline per her daughter. Suspect due to acute infection/dehydration, possibly related  to acute on chronic hyponatremia. Not hypoxemic or hypoglycemic. No abnormal LFT's.  - Monitor - Therapy evaluations ordered. Pt not felt strong enough to be discharged from ED.  Hyponatremia: Acute on chronic, baseline appears to be low 130's, at 126 on admission. Suspect dehydration/poor po. Not sure of clinical relevance of this. - Given 500cc NS in ED.  - Continue maintenance isotonic fluids overnight - Recheck in AM.  COPD: No acute exacerbation indicating steroids at this time. - Continue formulary for advair - Duonebs q12h, albuterol q3h prn  T2DM: Last HbA1c was 8.2% in Apr 2017. Glucose 179 on admission. - Hold metformin - Sensitive SSI - Pt declines carb modified diet. [place] for regular diet.  Dementia with depression: Seen by neurology as outpatient, having failed exelon, namenda and aricept. Currently lives in independent living with 8hrs/day caretaker during the day and 2hr at night. - Continue wellbutrin  DVT prophylaxis: Lovenox Code Status: Full confirmed at admission  Family Communication: Daughter at bedside Disposition Plan: Uncertain, therapy evaluations ordered. Consults called: None  Admission status: Observation    Vance Gather, MD [facility] Pager [phone]  If 7PM-7AM, please contact night-coverage [email or web address] Password [facility] 01/25/2017, 12:52 PM

## 2017-01-25 NOTE — Progress Notes (Signed)
PHARMACY NOTE:  ANTIMICROBIAL RENAL DOSAGE ADJUSTMENT  Current antimicrobial regimen includes a mismatch between antimicrobial dosage and estimated renal function.  As per policy approved by the Pharmacy & Therapeutics and Medical Executive Committees, the antimicrobial dosage will be adjusted accordingly.  Current antimicrobial dosage:  Tamiflu 75 mg daily  Indication: + Influenza A  Renal Function:  Estimated Creatinine Clearance: 61.2 mL/min (by C-G formula based on SCr of 0.75 mg/dL). []      On intermittent HD, scheduled: []      On CRRT    Antimicrobial dosage has been changed to:  Tamiflu 30 mg bid   Thank you for allowing pharmacy to be a part of this patient's care.  Dorrene German, [facility] 01/25/2017 12:10 PM

## 2017-01-25 NOTE — ED Provider Notes (Signed)
Emergency Department Provider Note   I have reviewed the triage vital signs and the nursing notes.   HISTORY  Chief Complaint Cough and Fever   HPI Melissa Fischer is a 83 y.o. female with PMH with Alzheimer's disease, COPD, DM, HLD, and pacemaker presents to the emergent department for evaluation of generalized weakness over the past several days with associated cough, pain everywhere, and URI symptoms. The daughter bedside provides most of the history and states that patient had an unwitnessed fall several days ago and complaining of right sided chest wall pain and been saying it's difficult to breathe. On my exam the patient is complaining of "hurting everywhere." No obvious head trauma during the fall. Patient has become progressively more weak and was started on Cipro yesterday by her primary care physician for possible urinary tract infection but is not improved. Patient does have some associated cough that is mildly productive and underlying COPD. She has not been using breathing treatments at home.   Past Medical History:  Diagnosis Date  . Alzheimer disease   . Bronchitis   . COPD (chronic obstructive pulmonary disease) ([place])   . Depression   . Diabetes ([place])   . Hypercholesteremia   . Memory loss   . Pacemaker    Medtronic DOI 2011  . Peripheral neuropathy ([place])   . Renal disease   . Shoulder pain, left   . Syncope     Patient Active Problem List   Diagnosis Date Noted  . Influenza A 01/25/2017  . Hip fracture ([place]) 04/26/2016  . Type 2 diabetes mellitus with hyperglycemia ([place]) 04/26/2016  . COPD (chronic obstructive pulmonary disease) ([place]) 04/26/2016  . Closed right hip fracture ([place]) 04/26/2016  . Subcapital fracture of right femur ([place])   . Alzheimer's dementia with behavioral disturbance 03/31/2016  . Dementia with behavioral disturbance 10/30/2015  . DM (diabetes mellitus) type II controlled, neurological manifestation ([place]) 10/30/2015  . Depression  10/30/2015  . CKD (chronic kidney disease) 10/30/2015    Past Surgical History:  Procedure Laterality Date  . HIP PINNING,CANNULATED Right 04/26/2016   Procedure: CANNULATED HIP PINNING;  Surgeon: Tania Ade, MD;  Location: [facility];  Service: Orthopedics;  Laterality: Right;  . no surgical history    . PACEMAKER PLACEMENT        Allergies Aricept [donepezil]; Exelon [rivastigmine]; Namenda [memantine]; Penicillins; Statins; and Sulfa antibiotics  Family History  Problem Relation Age of Onset  . Diabetes Mother   . Dementia Neg Hx     Social History Social History  Substance Use Topics  . Smoking status: Former Smoker    Quit date: 12/29/1974  . Smokeless tobacco: Never Used  . Alcohol use 0.0 oz/week     Comment: 1 drinks per day (10/29/15)    Review of Systems   10-point ROS otherwise negative.  ____________________________________________   PHYSICAL EXAM:  VITAL SIGNS: ED Triage Vitals  Enc Vitals Group     BP 01/25/17 0517 161/87     Pulse Rate 01/25/17 0517 106     Resp 01/25/17 0517 20     Temp 01/25/17 0517 (S) 99.8 F (37.7 C)     Temp Source 01/25/17 0517 (S) Oral     SpO2 01/25/17 0517 97 %     Pain Score 01/25/17 0508 10    Constitutional: Alert. Chronically Ill-appearing. No acute distress.  Eyes: Conjunctivae are normal. PERRL. Head: Atraumatic. Nose: No congestion/rhinnorhea. Mouth/Throat: Mucous membranes are dry. Oropharynx non-erythematous. Neck: No stridor.   Cardiovascular:  Normal rate, regular rhythm. Good peripheral circulation. Grossly normal heart sounds.   Respiratory: Normal respiratory effort.  No retractions. Lungs CTAB. Gastrointestinal: Soft and nontender. No distention.  Musculoskeletal: No lower extremity tenderness nor edema. No gross deformities of extremities. Neurologic:  Normal speech and language. No gross focal neurologic deficits are appreciated.  Skin:  Skin is warm, dry and intact. No rash  noted.   ____________________________________________   LABS (all labs ordered are listed, but only abnormal results are displayed)  Labs Reviewed  COMPREHENSIVE METABOLIC PANEL - Abnormal; Notable for the following:       Result Value   Sodium 126 (*)    Chloride 93 (*)    Glucose, Bld 179 (*)    Calcium 8.7 (*)    All other components within normal limits  CBC WITH DIFFERENTIAL/PLATELET - Abnormal; Notable for the following:    HCT 33.8 (*)    All other components within normal limits  URINALYSIS, ROUTINE W REFLEX MICROSCOPIC - Abnormal; Notable for the following:    Glucose, UA >=500 (*)    All other components within normal limits  INFLUENZA PANEL BY PCR (TYPE A & B) - Abnormal; Notable for the following:    Influenza A By PCR POSITIVE (*)    All other components within normal limits  GLUCOSE, CAPILLARY - Abnormal; Notable for the following:    Glucose-Capillary 135 (*)    All other components within normal limits  HEMOGLOBIN [id number]  BASIC METABOLIC PANEL  CBC  I-STAT CG4 LACTIC ACID, ED  I-STAT TROPOININ, ED  I-STAT CG4 LACTIC ACID, ED   ____________________________________________  EKG   EKG Interpretation  Date/Time:  Sunday January 25 2017 07:32:56 EST Ventricular Rate:  96 PR Interval:    QRS Duration: 91 QT Interval:  366 QTC Calculation: 463 R Axis:   -49 Text Interpretation:  Sinus rhythm Prolonged PR interval Inferior infarct, acute (RCA) Consider anterior infarct Probable RV involvement, suggest recording right precordial leads No STEMI.  Confirmed by Veer Elamin MD, Raine Elsass [phone]) on 01/25/2017 7:38:17 AM Also confirmed by Kyrel Leighton MD, Xitlaly Ault [phone]), editor Lorenda Cahill CT, Leda Gauze [phone])  on 01/25/2017 11:07:44 AM       ____________________________________________  RADIOLOGY  Dg Chest 2 View  Result Date: 01/25/2017 CLINICAL DATA:  Fever and cough EXAM: CHEST  2 VIEW COMPARISON:  04/26/2016 FINDINGS: Heart size within normal limits. Dual lead pacemaker unchanged.  Negative for heart failure. Lungs are clear without infiltrate effusion or mass. No change from the prior study. IMPRESSION: No active cardiopulmonary disease. Electronically Signed   By: Franchot Gallo M.D.   On: 01/25/2017 06:30    ____________________________________________   PROCEDURES  Procedure(s) performed:   Procedures  None ____________________________________________   INITIAL IMPRESSION / ASSESSMENT AND PLAN / ED COURSE  Pertinent labs & imaging results that were available during my care of the patient were reviewed by me and considered in my medical decision making (see chart for details).  Patient resents to the emergency department for evaluation of weakness, cough, URI symptoms, body pain. Chest x-ray with no focal infiltrate. She does have some mild to moderate hyponatremia which may be contributing to her generalized weakness but suspect underlying viral infectious process, possibly the flu. We will obtain a urinalysis. The patient was started on Cipro yesterday but it's unclear if she actually had her urine tested for infection. Anticipate admission in this case.  08:50 AM Lactate and troponin negative. Flu pending. Plan for admission for hyponatremia.   Positive flu. Plan  for admission for IVF and supportive care. Started Tamiflu.   Discussed patient's case with hospitalist, Dr. Bonner Puna.  Recommend admission to observation, med-surg bed.  I will place holding orders per their request. Patient and family (if present) updated with plan. Care transferred to hospitlaist service.  I reviewed all nursing notes, vitals, pertinent old records, EKGs, labs, imaging (as available).  ____________________________________________  FINAL CLINICAL IMPRESSION(S) / ED DIAGNOSES  Final diagnoses:  Hyponatremia     MEDICATIONS GIVEN DURING THIS VISIT:  Medications  oseltamivir (TAMIFLU) capsule 30 mg (not administered)  ipratropium-albuterol (DUONEB) 0.5-2.5 (3) MG/3ML  nebulizer solution 3 mL (3 mLs Nebulization Not Given 01/25/17 1343)  fluticasone furoate-vilanterol (BREO ELLIPTA) 200-25 MCG/INH 1 puff (1 puff Inhalation Not Given 01/25/17 1343)  buPROPion (WELLBUTRIN) tablet 75 mg (75 mg Oral Given 01/25/17 1450)  loratadine (CLARITIN) tablet 10 mg (not administered)  enoxaparin (LOVENOX) injection 40 mg (not administered)  0.9 %  sodium chloride infusion (1,000 mLs Intravenous New Bag/Given 01/25/17 1454)  insulin aspart (novoLOG) injection 0-9 Units (not administered)  insulin aspart (novoLOG) injection 0-5 Units (not administered)  ibuprofen (ADVIL,MOTRIN) tablet 600 mg (600 mg Oral Given 01/25/17 1449)  sodium chloride 0.9 % bolus 500 mL (0 mLs Intravenous Stopped 01/25/17 1040)  ipratropium-albuterol (DUONEB) 0.5-2.5 (3) MG/3ML nebulizer solution 3 mL (3 mLs Nebulization Given 01/25/17 0729)     NEW OUTPATIENT MEDICATIONS STARTED DURING THIS VISIT:  None   Note:  This document was prepared using Dragon voice recognition software and may include unintentional dictation errors.  Nanda Quinton, MD Emergency Medicine   Margette Fast, MD 01/25/17 [phone]

## 2017-01-25 NOTE — ED Notes (Signed)
Pt and pt's daughter refused rectal temp.

## 2017-01-25 NOTE — ED Triage Notes (Signed)
Patient came from [facility] in the assisted living. Patient is complaining of fever and cough. Patient was started on antibiotic yesterday. Patient is complaining of pain in her ribs when she cough. Patient is trying not cough. Per EMS patient cough sounds like rhonchi.

## 2017-01-25 NOTE — ED Notes (Signed)
Blood cultures x 2 drawn and held---- specimen sent down to lab.

## 2017-01-26 DIAGNOSIS — G308 Other Alzheimer's disease: Secondary | ICD-10-CM | POA: Diagnosis not present

## 2017-01-26 DIAGNOSIS — E1141 Type 2 diabetes mellitus with diabetic mononeuropathy: Secondary | ICD-10-CM

## 2017-01-26 DIAGNOSIS — J09X2 Influenza due to identified novel influenza A virus with other respiratory manifestations: Secondary | ICD-10-CM | POA: Diagnosis not present

## 2017-01-26 DIAGNOSIS — F0281 Dementia in other diseases classified elsewhere with behavioral disturbance: Secondary | ICD-10-CM

## 2017-01-26 DIAGNOSIS — J438 Other emphysema: Secondary | ICD-10-CM

## 2017-01-26 DIAGNOSIS — N189 Chronic kidney disease, unspecified: Secondary | ICD-10-CM | POA: Diagnosis not present

## 2017-01-26 DIAGNOSIS — J101 Influenza due to other identified influenza virus with other respiratory manifestations: Secondary | ICD-10-CM

## 2017-01-26 LAB — BASIC METABOLIC PANEL
ANION GAP: 6 (ref 5–15)
BUN: 11 mg/dL (ref 6–20)
CALCIUM: 8.1 mg/dL — AB (ref 8.9–10.3)
CO2: 23 mmol/L (ref 22–32)
Chloride: 99 mmol/L — ABNORMAL LOW (ref 101–111)
Creatinine, Ser: 0.8 mg/dL (ref 0.44–1.00)
GFR calc Af Amer: >60 mL/min (ref >60)
GFR calc non Af Amer: >60 mL/min (ref >60)
GLUCOSE: 134 mg/dL — AB (ref 65–99)
Potassium: 3.9 mmol/L (ref 3.5–5.1)
Sodium: 128 mmol/L — ABNORMAL LOW (ref 135–145)

## 2017-01-26 LAB — CBC
HCT: 35.6 % — ABNORMAL LOW (ref 36.0–46.0)
Hemoglobin: 12.6 g/dL (ref 12.0–15.0)
MCH: 30.7 pg (ref 26.0–34.0)
MCHC: 35.4 g/dL (ref 30.0–36.0)
MCV: 86.8 fL (ref 78.0–100.0)
Platelets: 157 K/uL (ref 150–400)
RBC: 4.1 MIL/uL (ref 3.87–5.11)
RDW: 12.6 % (ref 11.5–15.5)
WBC: 3.2 K/uL — ABNORMAL LOW (ref 4.0–10.5)

## 2017-01-26 LAB — GLUCOSE, CAPILLARY
Glucose-Capillary: 158 mg/dL — ABNORMAL HIGH (ref 65–99)
Glucose-Capillary: 123 mg/dL — ABNORMAL HIGH (ref 65–99)

## 2017-01-26 MED ORDER — IPRATROPIUM-ALBUTEROL 0.5-2.5 (3) MG/3ML IN SOLN
3.0000 mL | Freq: Four times a day (QID) | RESPIRATORY_TRACT | Status: DC | PRN
  Administered 2017-01-26: 3 mL via RESPIRATORY_TRACT
  Filled 2017-01-26: qty 3

## 2017-01-26 MED ORDER — IBUPROFEN 600 MG PO TABS: 600.0000 mg | ORAL_TABLET | Freq: Three times a day (TID) | ORAL | 0 refills | Status: DC | PRN

## 2017-01-26 MED ORDER — OSELTAMIVIR PHOSPHATE 30 MG PO CAPS: 30.0000 mg | ORAL_CAPSULE | Freq: Two times a day (BID) | ORAL | 0 refills | Status: AC

## 2017-01-26 MED ORDER — IPRATROPIUM-ALBUTEROL 0.5-2.5 (3) MG/3ML IN SOLN
3.0000 mL | Freq: Four times a day (QID) | RESPIRATORY_TRACT | Status: DC
  Administered 2017-01-26 (×2): 3 mL via RESPIRATORY_TRACT
  Filled 2017-01-26 (×2): qty 3

## 2017-01-26 NOTE — Care Management Note (Signed)
Case Management Note  Patient Details  Name: Melissa Fischer MRN: [id number] Date of Birth: [birth year]  Subjective/Objective:   83 yo admitted with Influenza A                 Action/Plan: Pt from [facility] independent living. PT recommendations were for HHPT. [facility] has their own PT department onsite [employer] school teacher). This CM faxed HHPT orders to [facility] [phone]. No other CM needs communicated.  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, dates discussed:    Additional Comments:  Lynnell Catalan, RN 01/26/2017, 12:29 PM

## 2017-01-26 NOTE — Evaluation (Signed)
Physical Therapy Evaluation Patient Details Name: Melissa Fischer MRN: [id number] DOB: 1934-08-26 Today's Date: 01/26/2017   History of Present Illness  Melissa Fischer is a 83 y.o. female with a history of alzheimer's dementia, COPD, T2DM, PPM, and hyponatremia who presented to [facility] for evaluation of generalized weakness and URI symptoms.   Clinical Impression  Pt admitted with above diagnosis. Pt currently with functional limitations due to the deficits listed below (see PT Problem List). * Pt will benefit from skilled PT to increase their independence and safety with mobility to allow discharge to the venue listed below.   Pt has caregivers at [facility], however is alone at night and would benefit  From HHPT since she is not quite back to her baseline mobility/activity tol     Follow Up Recommendations Home health PT (at [facility])    Equipment Recommendations  None recommended by PT    Recommendations for Other Services       Precautions / Restrictions Precautions Precautions: Fall Restrictions Weight Bearing Restrictions: No      Mobility  Bed Mobility               General bed mobility comments: pt up in recliner  Transfers Overall transfer level: Needs assistance Equipment used: Rolling walker (2 wheeled) Transfers: Sit to/from Stand Sit to Stand: Min guard         General transfer comment: cues for hand placement and safety  Ambulation/Gait Ambulation/Gait assistance: Min assist Ambulation Distance (Feet): 60 Feet Assistive device: Rolling walker (2 wheeled) Gait Pattern/deviations: Step-through pattern;Drifts right/left     General Gait Details: cues for RW safety  Stairs            Wheelchair Mobility    Modified Rankin (Stroke Patients Only)       Balance Overall balance assessment: Needs assistance   Sitting balance-Leahy Scale: Good       Standing balance-Leahy Scale: Fair                               Pertinent  Vitals/Pain Pain Assessment: No/denies pain    Home Living Family/patient expects to be discharged to::  [facility] living) Living Arrangements: Alone;Other (Comment)             Home Equipment: Walker - 2 wheels;Walker - 4 wheels Additional Comments: caregivers 7days a wk, 8am-4pm and 7pm-10pm; pt is alone 10p to 8a and is able to amb to bathroom at night I'ly    Prior Function Level of Independence: Needs assistance;Independent with assistive device(s)   Gait / Transfers Assistance Needed: prefers 2 wheeled walker, also furniture walks           Hand Dominance        Extremity/Trunk Assessment   Upper Extremity Assessment Upper Extremity Assessment: Defer to OT evaluation    Lower Extremity Assessment Lower Extremity Assessment: Overall WFL for tasks assessed       Communication   Communication: No difficulties  Cognition Arousal/Alertness: Awake/alert Behavior During Therapy: WFL for tasks assessed/performed Overall Cognitive Status: History of cognitive impairments - at baseline                      General Comments      Exercises     Assessment/Plan    PT Assessment Patient needs continued PT services  PT Problem List Decreased activity tolerance;Decreased balance;Decreased mobility;Decreased knowledge of use of DME;Decreased cognition  PT Treatment Interventions DME instruction;Gait training;Functional mobility training;Therapeutic exercise;Therapeutic activities;Balance training    PT Goals (Current goals can be found in the Care Plan section)  Acute Rehab PT Goals Patient Stated Goal: back to HG, feel better PT Goal Formulation: With patient Time For Goal Achievement: 02/02/17 Potential to Achieve Goals: Good    Frequency Min 3X/week   Barriers to discharge        Co-evaluation               End of Session Equipment Utilized During Treatment: Gait belt Activity Tolerance: Patient tolerated treatment  well Patient left: with call bell/phone within reach;in chair;with family/visitor present;with nursing/sitter in room           Time: 0927-0948 PT Time Calculation (min) (ACUTE ONLY): 21 min   Charges:   PT Evaluation $PT Eval Low Complexity: 1 Procedure     PT G Codes:        Monie Shere 01/28/2017, 11:35 AM

## 2017-01-26 NOTE — Progress Notes (Addendum)
Discharge instructions explained to home care giver.Home health aid will be giving pt ride home. Discharge via wheelchair. IV dc'd, gauze dressing applied.

## 2017-01-26 NOTE — Progress Notes (Signed)
OT Cancellation Note  Patient Details Name: Melissa Fischer MRN: [id number] DOB: 25-Nov-1934   Cancelled Treatment:    Reason Eval/Treat Not Completed: OT screened, no needs identified, will sign off  Britt Bottom 01/26/2017, 10:59 AM

## 2017-01-26 NOTE — Discharge Summary (Signed)
Physician Discharge Summary  [name] [id number] DOB: 1934-05-09 DOA: 01/25/2017  PCP: Mathews Argyle, MD  Admit date: 01/25/2017 Discharge date: 01/26/2017  Admitted From: ILF Disposition:  ILF  Recommendations for Outpatient Follow-up:  1. Follow up with PCP in 1 week 2. Please obtain BMP in two days to recheck sodium 3. Please follow up on the following pending results:  Home Health: Physical therapy  Discharge Condition: Stable CODE STATUS: Full code   Brief/Interim Summary:  HPI written by Vance Gather, MD on 01/25/2017  Chief Complaint: Weakness.   HPI: Melissa Fischer is a 83 y.o. female with a history of alzheimer's dementia, COPD, T2DM, PPM, and hyponatremia who presented to [facility] for evaluation of generalized weakness and URI symptoms.   History obtained mostly from daughter due to patient's confusion secondary to dementia. She reports about a week of worsening weakness/fatigue associated with cough that is intermittent, increasing in frequency, and nonproductive. She's developed a sore throat and mild sputum as well as moderate myalgias over the past 24 hours. These symptoms prompted PCP visit where UTI was diagnosed and cipro was started. She denied trouble breathing and has not needed breathing treatments PTA. She's had no abdominal pain, nausea, vomiting or diarrhea but has not been eating very much despite taking plenty of fluids.  ED Course: On arrival, oral temperature was 99.24F, HR 106, BP 161/87, 97% on room air. CXR without infiltrate and flu swab positive for influenza A. Urinalysis negative. Lactate and troponin negative. Sodium was noted to be 126, down from a baseline of the low 130's. She appeared significantly weak, prompting TRH to be called for observation.   Hospital course:  Influenza infection Initially contributing to acute enceophalopathy. Symptoms are mild likely secondary to history of flu vaccination. Patient started on Tamiflu for a  five day (10 doses) course. Patient afebrile  Acute encephalopathy Weakness Likely secondary to acute illness and dehydration. Fluids given and Tamiflu therapy started with improvement in mental status.   Hyponatremia Acute on chronic. Likely contributed to by chronic Wellbutrin. Improved with IV fluids. Encourage continued adequate by mouth intake of solutes.  COPD No exacerbation. Continued inhalers. Resume at discharge.  Type 2 diabetes mellitus Metformin held and patient placed on sliding scale insulin. Metformin restarted at discharge.  Dementia Follows neurology. Continued Wellbutrin. Outpatient management.  Discharge Diagnoses:  Principal Problem:   Influenza A Active Problems:   DM (diabetes mellitus) type II controlled, neurological manifestation (HCC)   CKD (chronic kidney disease)   Alzheimer's dementia with behavioral disturbance   COPD (chronic obstructive pulmonary disease) (HCC)    Discharge Instructions   Allergies as of 01/26/2017      Reactions   Aricept [donepezil]    Leg cramps   Exelon [rivastigmine] Other (See Comments)   dizziness and sad   Namenda [memantine]    dizziness   Penicillins    Statins Other (See Comments)   unknown   Sulfa Antibiotics       Medication List    STOP taking these medications   ciprofloxacin 500 MG tablet Commonly known as:  CIPRO     TAKE these medications   buPROPion 75 MG tablet Commonly known as:  WELLBUTRIN Take 75 mg by mouth 2 (two) times daily.   calcium-vitamin D 250-125 MG-UNIT tablet Commonly known as:  OSCAL WITH D Take 1 tablet by mouth daily.   fexofenadine 180 MG tablet Commonly known as:  ALLEGRA Take 180 mg by mouth daily.   Fluticasone-Salmeterol 250-50 MCG/DOSE Aepb  Commonly known as:  ADVAIR Inhale 1 puff into the lungs 2 (two) times daily.   FOSAMAX 70 MG tablet Generic drug:  alendronate Take 70 mg by mouth every Monday. Take with a full glass of water on an empty stomach.    ibuprofen 600 MG tablet Commonly known as:  ADVIL,MOTRIN Take 1 tablet (600 mg total) by mouth every 8 (eight) hours as needed for moderate pain.   metFORMIN 500 MG 24 hr tablet Commonly known as:  GLUCOPHAGE-XR Take 500 mg by mouth daily.   multivitamin with minerals Tabs tablet Take 1 tablet by mouth daily.   oseltamivir 30 MG capsule Commonly known as:  TAMIFLU Take 1 capsule (30 mg total) by mouth 2 (two) times daily. One dose the evening of 01/26/17, followed by two times daily starting on 01/27/17.      Follow-up Information    Mathews Argyle, MD. Schedule an appointment as soon as possible for a visit in 1 week(s).   Specialty:  Internal Medicine Contact information: [address] [place] [address] [phone]          Allergies  Allergen Reactions  . Aricept [Donepezil]     Leg cramps  . Exelon [Rivastigmine] Other (See Comments)    dizziness and sad  . Namenda [Memantine]     dizziness  . Penicillins   . Statins Other (See Comments)    unknown  . Sulfa Antibiotics     Consultations:  None   Procedures/Studies: Dg Chest 2 View  Result Date: 01/25/2017 CLINICAL DATA:  Fever and cough EXAM: CHEST  2 VIEW COMPARISON:  04/26/2016 FINDINGS: Heart size within normal limits. Dual lead pacemaker unchanged. Negative for heart failure. Lungs are clear without infiltrate effusion or mass. No change from the prior study. IMPRESSION: No active cardiopulmonary disease. Electronically Signed   By: Franchot Gallo M.D.   On: 01/25/2017 06:30    Subjective: Patient reports no issues overnight. Caregiver states patient is definitely close to, if not at, baseline.  Discharge Exam: Vitals:   01/25/17 2208 01/26/17 0628  BP: (!) 156/65 (!) 142/63  Pulse: 80 71  Resp: 20 19  Temp: 98.2 F (36.8 C) 97.8 F (36.6 C)   Vitals:   01/25/17 1948 01/25/17 2208 01/26/17 0628 01/26/17 0722  BP:  (!) 156/65 (!) 142/63   Pulse:  80 71   Resp:   20 19   Temp:  98.2 F (36.8 C) 97.8 F (36.6 C)   TempSrc:  Oral Oral   SpO2: 97% 99% 99% 95%  Weight:      Height:        General: Pt is alert, awake, not in acute distress Cardiovascular: RRR, S1/S2 +, no rubs, no gallops Respiratory: CTA bilaterally, no wheezing, no rhonchi Abdominal: Soft, NT, ND, bowel sounds + Extremities: no edema, no cyanosis    The results of significant diagnostics from this hospitalization (including imaging, microbiology, ancillary and laboratory) are listed below for reference.     Microbiology: Recent Results (from the past 240 hour(s))  MRSA PCR Screening     Status: Abnormal   Collection Time: 01/25/17  9:07 PM  Result Value Ref Range Status   MRSA by PCR INVALID RESULTS, SPECIMEN SENT FOR CULTURE (A) NEGATIVE Final    Comment:        The GeneXpert MRSA Assay (FDA approved for NASAL specimens only), is one component of a comprehensive MRSA colonization surveillance program. It is not intended to diagnose MRSA  infection nor to guide or monitor treatment for MRSA infections. RESULT CALLED TO, READ BACK BY AND VERIFIED WITH: B BAKER RN @ [id number] ON 01/25/17 BY C DAVIS      Labs: BNP (last 3 results) No results for input(s): BNP in the last 8760 hours. Basic Metabolic Panel:  Recent Labs Lab 01/25/17 0530 01/26/17 0401  NA 126* 128*  K 4.5 3.9  CL 93* 99*  CO2 24 23  GLUCOSE 179* 134*  BUN 12 11  CREATININE 0.75 0.80  CALCIUM 8.7* 8.1*   Liver Function Tests:  Recent Labs Lab 01/25/17 0530  AST 34  ALT 19  ALKPHOS 55  BILITOT 0.9  PROT 6.7  ALBUMIN 3.9   No results for input(s): LIPASE, AMYLASE in the last 168 hours. No results for input(s): AMMONIA in the last 168 hours. CBC:  Recent Labs Lab 01/25/17 0530 01/26/17 0401  WBC 6.0 3.2*  NEUTROABS 4.4  --   HGB 12.1 12.6  HCT 33.8* 35.6*  MCV 85.4 86.8  PLT 188 157   Cardiac Enzymes: No results for input(s): CKTOTAL, CKMB, CKMBINDEX, TROPONINI in the last  168 hours. BNP: Invalid input(s): POCBNP CBG:  Recent Labs Lab 01/25/17 1740 01/25/17 2205 01/26/17 0813  GLUCAP 135* 185* 158*   D-Dimer No results for input(s): DDIMER in the last 72 hours. Hgb A1c No results for input(s): HGBA1C in the last 72 hours. Lipid Profile No results for input(s): CHOL, HDL, LDLCALC, TRIG, CHOLHDL, LDLDIRECT in the last 72 hours. Thyroid function studies No results for input(s): TSH, T4TOTAL, T3FREE, THYROIDAB in the last 72 hours.  Invalid input(s): FREET3 Anemia work up No results for input(s): VITAMINB12, FOLATE, FERRITIN, TIBC, IRON, RETICCTPCT in the last 72 hours. Urinalysis    Component Value Date/Time   COLORURINE YELLOW 01/25/2017 0730   APPEARANCEUR CLEAR 01/25/2017 0730   LABSPEC 1.016 01/25/2017 0730   PHURINE 5.0 01/25/2017 0730   GLUCOSEU >=500 (A) 01/25/2017 0730   HGBUR NEGATIVE 01/25/2017 0730   BILIRUBINUR NEGATIVE 01/25/2017 0730   KETONESUR NEGATIVE 01/25/2017 0730   PROTEINUR NEGATIVE 01/25/2017 0730   NITRITE NEGATIVE 01/25/2017 0730   LEUKOCYTESUR NEGATIVE 01/25/2017 0730   Sepsis Labs Invalid input(s): PROCALCITONIN,  WBC,  LACTICIDVEN Microbiology Recent Results (from the past 240 hour(s))  MRSA PCR Screening     Status: Abnormal   Collection Time: 01/25/17  9:07 PM  Result Value Ref Range Status   MRSA by PCR INVALID RESULTS, SPECIMEN SENT FOR CULTURE (A) NEGATIVE Final    Comment:        The GeneXpert MRSA Assay (FDA approved for NASAL specimens only), is one component of a comprehensive MRSA colonization surveillance program. It is not intended to diagnose MRSA infection nor to guide or monitor treatment for MRSA infections. RESULT CALLED TO, READ BACK BY AND VERIFIED WITH: B BAKER RN @ [id number] ON 01/25/17 BY C DAVIS      Time coordinating discharge: Over 30 minutes  SIGNED:   Cordelia Poche, MD [facility] 01/26/2017, 12:13 PM Pager [phone]  If 7PM-7AM, please contact  night-coverage [email or web address] Password [email or web address]

## 2017-01-27 LAB — HEMOGLOBIN A1C
HEMOGLOBIN A1C: 7.9 % — AB (ref 4.8–5.6)
MEAN PLASMA GLUCOSE: 180 mg/dL

## 2017-01-27 LAB — MRSA CULTURE: Culture: NOT DETECTED

## 2017-01-29 DIAGNOSIS — E1165 Type 2 diabetes mellitus with hyperglycemia: Secondary | ICD-10-CM | POA: Diagnosis not present

## 2017-01-29 DIAGNOSIS — G309 Alzheimer's disease, unspecified: Secondary | ICD-10-CM | POA: Diagnosis not present

## 2017-01-29 DIAGNOSIS — J101 Influenza due to other identified influenza virus with other respiratory manifestations: Secondary | ICD-10-CM | POA: Diagnosis not present

## 2017-01-29 DIAGNOSIS — E1140 Type 2 diabetes mellitus with diabetic neuropathy, unspecified: Secondary | ICD-10-CM | POA: Diagnosis not present

## 2017-01-29 DIAGNOSIS — Z950 Presence of cardiac pacemaker: Secondary | ICD-10-CM | POA: Diagnosis not present

## 2017-01-29 DIAGNOSIS — Z7984 Long term (current) use of oral hypoglycemic drugs: Secondary | ICD-10-CM | POA: Diagnosis not present

## 2017-01-29 DIAGNOSIS — E871 Hypo-osmolality and hyponatremia: Secondary | ICD-10-CM | POA: Diagnosis not present

## 2017-01-29 DIAGNOSIS — E119 Type 2 diabetes mellitus without complications: Secondary | ICD-10-CM | POA: Diagnosis not present

## 2017-01-29 DIAGNOSIS — Z79899 Other long term (current) drug therapy: Secondary | ICD-10-CM | POA: Diagnosis not present

## 2017-01-30 DIAGNOSIS — R41841 Cognitive communication deficit: Secondary | ICD-10-CM | POA: Diagnosis not present

## 2017-01-31 ENCOUNTER — Emergency Department (HOSPITAL_COMMUNITY): Payer: Medicare Other

## 2017-01-31 ENCOUNTER — Encounter (HOSPITAL_COMMUNITY): Payer: Self-pay | Admitting: Oncology

## 2017-01-31 ENCOUNTER — Inpatient Hospital Stay (HOSPITAL_COMMUNITY)
Admission: EM | Admit: 2017-01-31 | Discharge: 2017-02-06 | DRG: 643 | Disposition: A | Discharge status: Skilled Nursing Facility | Payer: Medicare Other | Attending: Internal Medicine | Admitting: Internal Medicine

## 2017-01-31 DIAGNOSIS — Z7984 Long term (current) use of oral hypoglycemic drugs: Secondary | ICD-10-CM

## 2017-01-31 DIAGNOSIS — Z888 Allergy status to other drugs, medicaments and biological substances status: Secondary | ICD-10-CM

## 2017-01-31 DIAGNOSIS — J449 Chronic obstructive pulmonary disease, unspecified: Secondary | ICD-10-CM | POA: Diagnosis not present

## 2017-01-31 DIAGNOSIS — Z9181 History of falling: Secondary | ICD-10-CM

## 2017-01-31 DIAGNOSIS — E860 Dehydration: Secondary | ICD-10-CM | POA: Diagnosis present

## 2017-01-31 DIAGNOSIS — G9341 Metabolic encephalopathy: Secondary | ICD-10-CM | POA: Diagnosis present

## 2017-01-31 DIAGNOSIS — Z950 Presence of cardiac pacemaker: Secondary | ICD-10-CM

## 2017-01-31 DIAGNOSIS — F0391 Unspecified dementia with behavioral disturbance: Secondary | ICD-10-CM | POA: Diagnosis present

## 2017-01-31 DIAGNOSIS — F329 Major depressive disorder, single episode, unspecified: Secondary | ICD-10-CM | POA: Diagnosis present

## 2017-01-31 DIAGNOSIS — J438 Other emphysema: Secondary | ICD-10-CM | POA: Diagnosis not present

## 2017-01-31 DIAGNOSIS — R531 Weakness: Secondary | ICD-10-CM

## 2017-01-31 DIAGNOSIS — F0281 Dementia in other diseases classified elsewhere with behavioral disturbance: Secondary | ICD-10-CM | POA: Diagnosis not present

## 2017-01-31 DIAGNOSIS — F02818 Dementia in other diseases classified elsewhere, unspecified severity, with other behavioral disturbance: Secondary | ICD-10-CM | POA: Diagnosis present

## 2017-01-31 DIAGNOSIS — I10 Essential (primary) hypertension: Secondary | ICD-10-CM | POA: Diagnosis present

## 2017-01-31 DIAGNOSIS — F03918 Unspecified dementia, unspecified severity, with other behavioral disturbance: Secondary | ICD-10-CM | POA: Diagnosis present

## 2017-01-31 DIAGNOSIS — S0990XA Unspecified injury of head, initial encounter: Secondary | ICD-10-CM | POA: Diagnosis not present

## 2017-01-31 DIAGNOSIS — Z881 Allergy status to other antibiotic agents status: Secondary | ICD-10-CM

## 2017-01-31 DIAGNOSIS — F32A Depression, unspecified: Secondary | ICD-10-CM | POA: Diagnosis present

## 2017-01-31 DIAGNOSIS — G309 Alzheimer's disease, unspecified: Secondary | ICD-10-CM | POA: Diagnosis present

## 2017-01-31 DIAGNOSIS — N390 Urinary tract infection, site not specified: Secondary | ICD-10-CM | POA: Diagnosis present

## 2017-01-31 DIAGNOSIS — Z79899 Other long term (current) drug therapy: Secondary | ICD-10-CM

## 2017-01-31 DIAGNOSIS — R296 Repeated falls: Secondary | ICD-10-CM | POA: Diagnosis present

## 2017-01-31 DIAGNOSIS — E1165 Type 2 diabetes mellitus with hyperglycemia: Secondary | ICD-10-CM | POA: Diagnosis present

## 2017-01-31 DIAGNOSIS — E222 Syndrome of inappropriate secretion of antidiuretic hormone: Secondary | ICD-10-CM

## 2017-01-31 DIAGNOSIS — Z87891 Personal history of nicotine dependence: Secondary | ICD-10-CM

## 2017-01-31 DIAGNOSIS — E1142 Type 2 diabetes mellitus with diabetic polyneuropathy: Secondary | ICD-10-CM | POA: Diagnosis not present

## 2017-01-31 DIAGNOSIS — Z833 Family history of diabetes mellitus: Secondary | ICD-10-CM

## 2017-01-31 DIAGNOSIS — G9340 Encephalopathy, unspecified: Secondary | ICD-10-CM | POA: Diagnosis present

## 2017-01-31 DIAGNOSIS — I444 Left anterior fascicular block: Secondary | ICD-10-CM | POA: Diagnosis present

## 2017-01-31 DIAGNOSIS — G308 Other Alzheimer's disease: Secondary | ICD-10-CM | POA: Diagnosis not present

## 2017-01-31 DIAGNOSIS — E871 Hypo-osmolality and hyponatremia: Secondary | ICD-10-CM | POA: Diagnosis not present

## 2017-01-31 DIAGNOSIS — Z880 Allergy status to penicillin: Secondary | ICD-10-CM

## 2017-01-31 DIAGNOSIS — F419 Anxiety disorder, unspecified: Secondary | ICD-10-CM | POA: Diagnosis present

## 2017-01-31 LAB — CBC
HCT: 33.8 % — ABNORMAL LOW (ref 36.0–46.0)
Hemoglobin: 12.3 g/dL (ref 12.0–15.0)
MCH: 29.9 pg (ref 26.0–34.0)
MCHC: 36.4 g/dL — ABNORMAL HIGH (ref 30.0–36.0)
MCV: 82.2 fL (ref 78.0–100.0)
PLATELETS: 246 10*3/uL (ref 150–400)
RBC: 4.11 MIL/uL (ref 3.87–5.11)
RDW: 12 % (ref 11.5–15.5)
WBC: 9.5 10*3/uL (ref 4.0–10.5)

## 2017-01-31 LAB — COMPREHENSIVE METABOLIC PANEL
ALT: 24 U/L (ref 14–54)
AST: 37 U/L (ref 15–41)
Albumin: 4.2 g/dL (ref 3.5–5.0)
Alkaline Phosphatase: 55 U/L (ref 38–126)
Anion gap: 10 (ref 5–15)
BILIRUBIN TOTAL: 0.8 mg/dL (ref 0.3–1.2)
BUN: 15 mg/dL (ref 6–20)
CO2: 23 mmol/L (ref 22–32)
CREATININE: 0.84 mg/dL (ref 0.44–1.00)
Calcium: 9.3 mg/dL (ref 8.9–10.3)
Chloride: 90 mmol/L — ABNORMAL LOW (ref 101–111)
Glucose, Bld: 167 mg/dL — ABNORMAL HIGH (ref 65–99)
POTASSIUM: 4.6 mmol/L (ref 3.5–5.1)
Sodium: 123 mmol/L — ABNORMAL LOW (ref 135–145)
TOTAL PROTEIN: 7.3 g/dL (ref 6.5–8.1)

## 2017-01-31 LAB — I-STAT TROPONIN, ED: TROPONIN I, POC: 0 ng/mL (ref 0.00–0.08)

## 2017-01-31 LAB — CBG MONITORING, ED: Glucose-Capillary: 162 mg/dL — ABNORMAL HIGH (ref 65–99)

## 2017-01-31 MED ORDER — INSULIN ASPART 100 UNIT/ML ~~LOC~~ SOLN
0.0000 [IU] | Freq: Every day | SUBCUTANEOUS | Status: DC
  Administered 2017-02-01: 0 [IU] via SUBCUTANEOUS
  Administered 2017-02-04: 3 [IU] via SUBCUTANEOUS
  Administered 2017-02-05: 2 [IU] via SUBCUTANEOUS

## 2017-01-31 MED ORDER — SODIUM CHLORIDE 0.9 % IV SOLN
INTRAVENOUS | Status: DC
  Administered 2017-02-01: 01:00:00 via INTRAVENOUS

## 2017-01-31 MED ORDER — ADULT MULTIVITAMIN W/MINERALS CH
1.0000 | ORAL_TABLET | Freq: Every day | ORAL | Status: DC
  Administered 2017-02-01 – 2017-02-06 (×6): 1 via ORAL
  Filled 2017-01-31 (×6): qty 1

## 2017-01-31 MED ORDER — LORATADINE 10 MG PO TABS
10.0000 mg | ORAL_TABLET | Freq: Every day | ORAL | Status: DC
  Administered 2017-02-01 – 2017-02-06 (×6): 10 mg via ORAL
  Filled 2017-01-31 (×6): qty 1

## 2017-01-31 MED ORDER — CALCIUM CARBONATE-VITAMIN D 500-200 MG-UNIT PO TABS
1.0000 | ORAL_TABLET | Freq: Every day | ORAL | Status: DC
  Administered 2017-02-01 – 2017-02-06 (×6): 1 via ORAL
  Filled 2017-01-31 (×6): qty 1

## 2017-01-31 MED ORDER — SODIUM CHLORIDE 0.9 % IV SOLN
Freq: Once | INTRAVENOUS | Status: AC
  Administered 2017-01-31: 22:00:00 via INTRAVENOUS

## 2017-01-31 MED ORDER — IBUPROFEN 200 MG PO TABS: 600.0000 mg | ORAL_TABLET | Freq: Three times a day (TID) | ORAL | Status: DC | PRN

## 2017-01-31 MED ORDER — ACETAMINOPHEN 650 MG RE SUPP: 650.0000 mg | Freq: Four times a day (QID) | RECTAL | Status: DC | PRN

## 2017-01-31 MED ORDER — ENOXAPARIN SODIUM 40 MG/0.4ML ~~LOC~~ SOLN
40.0000 mg | Freq: Every day | SUBCUTANEOUS | Status: DC
  Administered 2017-02-01 – 2017-02-03 (×3): 40 mg via SUBCUTANEOUS
  Filled 2017-01-31 (×3): qty 0.4

## 2017-01-31 MED ORDER — ONDANSETRON HCL 4 MG/2ML IJ SOLN: 4.0000 mg | Freq: Four times a day (QID) | INTRAMUSCULAR | Status: DC | PRN

## 2017-01-31 MED ORDER — ONDANSETRON HCL 4 MG PO TABS: 4.0000 mg | ORAL_TABLET | Freq: Four times a day (QID) | ORAL | Status: DC | PRN

## 2017-01-31 MED ORDER — POLYETHYLENE GLYCOL 3350 17 G PO PACK: 17.0000 g | PACK | Freq: Every day | ORAL | Status: DC | PRN

## 2017-01-31 MED ORDER — ACETAMINOPHEN 325 MG PO TABS
650.0000 mg | ORAL_TABLET | Freq: Four times a day (QID) | ORAL | Status: DC | PRN
  Administered 2017-02-04 – 2017-02-06 (×3): 650 mg via ORAL
  Filled 2017-01-31 (×3): qty 2

## 2017-01-31 MED ORDER — INSULIN ASPART 100 UNIT/ML ~~LOC~~ SOLN
0.0000 [IU] | Freq: Three times a day (TID) | SUBCUTANEOUS | Status: DC
  Administered 2017-02-01: 3 [IU] via SUBCUTANEOUS
  Administered 2017-02-01: 8 [IU] via SUBCUTANEOUS
  Administered 2017-02-02 (×2): 3 [IU] via SUBCUTANEOUS
  Administered 2017-02-02: 5 [IU] via SUBCUTANEOUS
  Administered 2017-02-03 (×2): 3 [IU] via SUBCUTANEOUS
  Administered 2017-02-03: 5 [IU] via SUBCUTANEOUS
  Administered 2017-02-04: 3 [IU] via SUBCUTANEOUS
  Administered 2017-02-04 (×2): 5 [IU] via SUBCUTANEOUS
  Administered 2017-02-05: 8 [IU] via SUBCUTANEOUS
  Administered 2017-02-05: 3 [IU] via SUBCUTANEOUS
  Administered 2017-02-05 – 2017-02-06 (×2): 5 [IU] via SUBCUTANEOUS
  Administered 2017-02-06: 8 [IU] via SUBCUTANEOUS

## 2017-01-31 MED ORDER — MOMETASONE FURO-FORMOTEROL FUM 200-5 MCG/ACT IN AERO
2.0000 | INHALATION_SPRAY | Freq: Two times a day (BID) | RESPIRATORY_TRACT | Status: DC
  Administered 2017-02-01 – 2017-02-06 (×10): 2 via RESPIRATORY_TRACT
  Filled 2017-01-31 (×2): qty 8.8

## 2017-01-31 MED ORDER — DEXTROMETHORPHAN POLISTIREX ER 30 MG/5ML PO SUER
60.0000 mg | Freq: Two times a day (BID) | ORAL | Status: DC | PRN
  Administered 2017-02-02: 60 mg via ORAL
  Filled 2017-01-31 (×4): qty 10

## 2017-01-31 MED ORDER — BUPROPION HCL 75 MG PO TABS
75.0000 mg | ORAL_TABLET | Freq: Two times a day (BID) | ORAL | Status: DC
  Administered 2017-02-01 (×2): 75 mg via ORAL
  Filled 2017-01-31 (×2): qty 1

## 2017-01-31 NOTE — ED Notes (Signed)
Pt called to use restroom, Pt's depends dry and Pt stated she does not have to use the restroom.

## 2017-01-31 NOTE — H&P (Signed)
History and Physical    Melissa Fischer [id number] DOB: 1934/10/29 DOA: 01/31/2017  PCP: Mathews Argyle, MD   Patient coming from: [facility]   Chief Complaint: Confusion, weakness, nausea   HPI: Melissa Fischer is a 83 y.o. female with medical history significant for Alzheimer's dementia, type 2 diabetes mellitus, COPD, and cardiac syncope status-post pacer placement with no recurrence, now presenting to the emergency department for evaluation of generalized weakness, confusion, and nausea. Patient is accompanied by her daughter who assists with the history. The patient had been admitted on 01/25/2017 with very similar symptoms, found to have influenza a, was treated with Tamiflu and given IV hydration, and reports returning to her usual state by time of discharge. Unfortunately, she has had a recurrence in weakness and confusion over the past couple days, now with nausea as well. There is been no fevers or chills and the patient has not coughing or dyspneic. There has been no recent fall or head trauma and no recent change in her medications. Patient has complained of nausea, but there has been no vomiting or diarrhea. She denies abdominal pain. Sodium was low at time of the recent admission, but improved with hydration prior to discharge. Patient denies change in vision or hearing, or focal numbness or weakness. She has not complained of headache per se, but her daughter has noted her to be holding her forehead in apparent discomfort intermittently over the past couple days.  ED Course: Upon arrival to the ED, patient is found to be afebrile, saturating adequately on room air, and with vital signs otherwise stable. EKG features a sinus rhythm with left anterior fascicular block and no significant change from prior. Noncontrast head CT is negative for acute intracranial abnormality. Chest x-ray is negative for acute cardiopulmonary disease. Chemistry panels notable for sodium of 123, down from 128 on  01/26/17. CBC is unremarkable and troponin is undetectable. UA was ordered and remains pending. Patient was started on a gentle infusion of normal saline in the ED. She remains hemodynamically stable and in no acute respiratory distress, but given her worsening confusion with nausea and acute on chronic hyponatremia, she'll be observed in the hospital for further evaluation and management of this.  Review of Systems:  All other systems reviewed and apart from HPI, are negative.  Past Medical History:  Diagnosis Date  . Alzheimer disease   . Bronchitis   . COPD (chronic obstructive pulmonary disease) ([place])   . Depression   . Diabetes ([place])   . Hypercholesteremia   . Memory loss   . Pacemaker    [employer] DOI 2011  . Peripheral neuropathy ([place])   . Renal disease   . Shoulder pain, left   . Syncope     Past Surgical History:  Procedure Laterality Date  . HIP PINNING,CANNULATED Right 04/26/2016   Procedure: CANNULATED HIP PINNING;  Surgeon: Tania Ade, MD;  Location: [facility];  Service: Orthopedics;  Laterality: Right;  . no surgical history    . PACEMAKER PLACEMENT       reports that she quit smoking about 42 years ago. She has never used smokeless tobacco. She reports that she drinks alcohol. She reports that she does not use drugs.  Allergies  Allergen Reactions  . Aricept [Donepezil] Other (See Comments)    Leg cramps  . Exelon [Rivastigmine] Other (See Comments)    dizziness and sad  . Namenda [Memantine] Other (See Comments)    dizziness  . Penicillins     "pt does not  know its been so long" Has patient had a PCN reaction causing immediate rash, facial/tongue/throat swelling, SOB or lightheadedness with hypotension: Unknown Has patient had a PCN reaction causing severe rash involving mucus membranes or skin necrosis: Unknown Has patient had a PCN reaction that required hospitalization: Unknown Has patient had a PCN reaction occurring within the last 10 years:  Unknown If all of the above answers are "NO", then may proceed with Cephalosporin use.   . Statins Other (See Comments)    unknown  . Sulfa Antibiotics Other (See Comments)    Unknown    Family History  Problem Relation Age of Onset  . Diabetes Mother   . Dementia Neg Hx      Prior to Admission medications   Medication Sig Start Date End Date Taking? Authorizing Provider  alendronate (FOSAMAX) 70 MG tablet Take 70 mg by mouth every Monday. Take with a full glass of water on an empty stomach.   Yes Historical Provider, MD  buPROPion (WELLBUTRIN) 75 MG tablet Take 75 mg by mouth 2 (two) times daily.    Yes Historical Provider, MD  calcium-vitamin D (OSCAL WITH D) 250-125 MG-UNIT tablet Take 1 tablet by mouth daily.    Yes Historical Provider, MD  ciprofloxacin (CIPRO) 500 MG tablet Take 500 mg by mouth 2 (two) times daily.   Yes Historical Provider, MD  dextromethorphan (DELSYM) 30 MG/5ML liquid Take 60 mg by mouth every 12 (twelve) hours as needed for cough.  01/29/17  Yes Historical Provider, MD  fexofenadine (ALLEGRA) 180 MG tablet Take 180 mg by mouth daily.    Yes Historical Provider, MD  Fluticasone-Salmeterol (ADVAIR) 250-50 MCG/DOSE AEPB Inhale 1 puff into the lungs 2 (two) times daily.   Yes Historical Provider, MD  ibuprofen (ADVIL,MOTRIN) 600 MG tablet Take 1 tablet (600 mg total) by mouth every 8 (eight) hours as needed for moderate pain. 01/26/17  Yes Mariel Aloe, MD  metFORMIN (GLUCOPHAGE-XR) 500 MG 24 hr tablet Take 500 mg by mouth daily with breakfast.  01/23/16  Yes Historical Provider, MD  Multiple Vitamin (MULTIVITAMIN WITH MINERALS) TABS tablet Take 1 tablet by mouth daily.   Yes Historical Provider, MD    Physical Exam: Vitals:   01/31/17 2215 01/31/17 2216 01/31/17 2230 01/31/17 2245  BP:  153/77 147/94   Pulse: 81 80 81 80  Resp: 15 16 19 17   Temp:      TempSrc:      SpO2: 100% 98% 99% 99%      Constitutional: NAD, calm, comfortable Eyes: PERTLA, lids  and conjunctivae normal ENMT: Mucous membranes are moist. Posterior pharynx clear of any exudate or lesions.   Neck: normal, supple, no masses, no thyromegaly Respiratory: Rhonchi at mid-lung zones, left > right. Normal respiratory effort. No accessory muscle use.  Cardiovascular: S1 & S2 heard, regular rate and rhythm. No extremity edema. No significant JVD. Abdomen: No distension, no tenderness, no masses palpated. Bowel sounds normal.  Musculoskeletal: no clubbing / cyanosis. No joint deformity upper and lower extremities.    Skin: no significant rashes, lesions, ulcers. Warm, dry, well-perfused. Poor turgor. Neurologic: CN 2-12 grossly intact. Sensation intact, DTR normal. Strength 5/5 in all 4 limbs.  Psychiatric: Alert and oriented to person and place, recognizes her daughter at bedside, but not oriented to situation, month, or year. Pleasant and cooperative.     Labs on Admission: I have personally reviewed following labs and imaging studies  CBC:  Recent Labs Lab 01/25/17 0530 01/26/17 0401 01/31/17  2010  WBC 6.0 3.2* 9.5  NEUTROABS 4.4  --   --   HGB 12.1 12.6 12.3  HCT 33.8* 35.6* 33.8*  MCV 85.4 86.8 82.2  PLT 188 157 0000000   Basic Metabolic Panel:  Recent Labs Lab 01/25/17 0530 01/26/17 0401 01/31/17 2010  NA 126* 128* 123*  K 4.5 3.9 4.6  CL 93* 99* 90*  CO2 24 23 23   GLUCOSE 179* 134* 167*  BUN 12 11 15   CREATININE 0.75 0.80 0.84  CALCIUM 8.7* 8.1* 9.3   GFR: Estimated Creatinine Clearance: 58.3 mL/min (by C-G formula based on SCr of 0.84 mg/dL). Liver Function Tests:  Recent Labs Lab 01/25/17 0530 01/31/17 2010  AST 34 37  ALT 19 24  ALKPHOS 55 55  BILITOT 0.9 0.8  PROT 6.7 7.3  ALBUMIN 3.9 4.2   No results for input(s): LIPASE, AMYLASE in the last 168 hours. No results for input(s): AMMONIA in the last 168 hours. Coagulation Profile: No results for input(s): INR, PROTIME in the last 168 hours. Cardiac Enzymes: No results for input(s):  CKTOTAL, CKMB, CKMBINDEX, TROPONINI in the last 168 hours. BNP (last 3 results) No results for input(s): PROBNP in the last 8760 hours. HbA1C: No results for input(s): HGBA1C in the last 72 hours. CBG:  Recent Labs Lab 01/25/17 1740 01/25/17 2205 01/26/17 0813 01/26/17 1243 01/31/17 2009  GLUCAP 135* 185* 158* 123* 162*   Lipid Profile: No results for input(s): CHOL, HDL, LDLCALC, TRIG, CHOLHDL, LDLDIRECT in the last 72 hours. Thyroid Function Tests: No results for input(s): TSH, T4TOTAL, FREET4, T3FREE, THYROIDAB in the last 72 hours. Anemia Panel: No results for input(s): VITAMINB12, FOLATE, FERRITIN, TIBC, IRON, RETICCTPCT in the last 72 hours. Urine analysis:    Component Value Date/Time   COLORURINE YELLOW 01/25/2017 0730   APPEARANCEUR CLEAR 01/25/2017 0730   LABSPEC 1.016 01/25/2017 0730   PHURINE 5.0 01/25/2017 0730   GLUCOSEU >=500 (A) 01/25/2017 0730   HGBUR NEGATIVE 01/25/2017 0730   BILIRUBINUR NEGATIVE 01/25/2017 0730   KETONESUR NEGATIVE 01/25/2017 0730   PROTEINUR NEGATIVE 01/25/2017 0730   NITRITE NEGATIVE 01/25/2017 0730   LEUKOCYTESUR NEGATIVE 01/25/2017 0730   Sepsis Labs: @LABRCNTIP (procalcitonin:4,lacticidven:4) ) Recent Results (from the past 240 hour(s))  MRSA PCR Screening     Status: Abnormal   Collection Time: 01/25/17  9:07 PM  Result Value Ref Range Status   MRSA by PCR INVALID RESULTS, SPECIMEN SENT FOR CULTURE (A) NEGATIVE Final    Comment:        The GeneXpert MRSA Assay (FDA approved for NASAL specimens only), is one component of a comprehensive MRSA colonization surveillance program. It is not intended to diagnose MRSA infection nor to guide or monitor treatment for MRSA infections. RESULT CALLED TO, READ BACK BY AND VERIFIED WITH: B BAKER RN @ [id number] ON 01/25/17 BY C DAVIS   MRSA culture     Status: None   Collection Time: 01/25/17  9:07 PM  Result Value Ref Range Status   Specimen Description NOSE  Final   Special Requests  NONE  Final   Culture   Final    NO MRSA DETECTED Performed at [facility], [address], [place], [place] [address]    Report Status 01/27/2017 FINAL  Final     Radiological Exams on Admission: Dg Chest 2 View  Result Date: 01/31/2017 CLINICAL DATA:  Weakness.  Recent diagnosis of flu. EXAM: CHEST  2 VIEW COMPARISON:  01/25/2017 FINDINGS: Left-sided pacemaker remains in place. Unchanged  heart size and mediastinal contours. No pulmonary edema, focal airspace disease, pleural fluid or pneumothorax. Chronic mild compression deformity in the lower thoracic spine. Chronic change about the left proximal humerus. IMPRESSION: No acute abnormality or change from prior exam. Electronically Signed   By: Jeb Levering M.D.   On: 01/31/2017 21:27   Ct Head Wo Contrast  Result Date: 01/31/2017 CLINICAL DATA:  pt's mental status is altered, pt cannot find the words to complete a sentence. Also pt is very weak and unable to ambulate at this time. Per pt's daughter these sx were all present during last visit and admission. EXAM: CT HEAD WITHOUT CONTRAST TECHNIQUE: Contiguous axial images were obtained from the base of the skull through the vertex without intravenous contrast. COMPARISON:  04/25/2016 FINDINGS: Brain: There is central and cortical atrophy. Periventricular white matter changes are consistent with small vessel disease. There is no intra or extra-axial fluid collection or mass lesion. The basilar cisterns and ventricles have a normal appearance. There is no CT evidence for acute infarction or hemorrhage. Vascular: There is atherosclerotic calcification of the carotid siphons. Skull: Normal. Negative for fracture or focal lesion. Sinuses/Orbits: No acute finding. Other: None IMPRESSION: 1. Atrophy and small vessel disease. 2.  No evidence for acute intracranial abnormality. Electronically Signed   By: Nolon Nations M.D.   On: 01/31/2017 21:34    EKG: Independently reviewed. Sinus rhythm,  LAFB, no significant change from priors.   Assessment/Plan  1. Acute encephalopathy  - Pt has some degree of confusion at baseline, but this has worsened over the 2-3 days leading up to admission - There is no focal neurologic deficit elicited and head CT is negative for acute intracranial abnormality  - No systemic signs of infection, CXR clear, UA remains pending  - Most likely secondary to acute on chronic hyponatremia; addressing this as below  - Check ammonia, UA, thyroid studies; B12 and folate were normal a yr ago, will recheck    2. Hyponatremia  - Serum sodium is 123 on admission, down from 128 on 01/26/17  - She is mildly hypovolemic clinically  - Check TSH, urine and serum osm, urine sodium  - Start a gentle NS infusion and follow serial chem panels  - Aim to correct up to 128-130 over next 24 hrs  3. Alzheimer's dementia  - Follows with neurology  - Per neuro notes, has failed Exelon, Namenda, and Aricept   4. COPD - No wheezing of obstructed breathing on admission; lungs with coarse rhonchi, likely from resolving viral illness  - Continue scheduled ICS/LABA    5. Depression  - Difficult to assess on admission given the acute confusional state; appears to be stable  - Continue Wellbutrin    6. Type II DM  - A1c 7.9% in January 2018  - Managed with metformin only at her ILF; this is held on admission  - Check CBG with meals and qHS - Start a moderate-intensity sliding-scale insulin correctional    DVT prophylaxis: sq Lovenox  Code Status: Full  Family Communication: Daughter updated at bedside Disposition Plan: Observe on med-surg Consults called: None Admission status: Observation    Vianne Bulls, MD [facility] Pager [phone]  If 7PM-7AM, please contact night-coverage [email or web address] Password [facility]  01/31/2017, 10:47 PM

## 2017-01-31 NOTE — ED Triage Notes (Signed)
Per pt's daughter pt was dx w/ the flu and admitted last week.  D/c'd on Monday.  Per pt's daughter pt's mental status is altered, pt cannot find the words to complete a sentence.  Also pt is very weak and unable to ambulate at this time.  Per pt's daughter these sx were all present during last visit and admission.

## 2017-01-31 NOTE — ED Provider Notes (Signed)
[facility] DEPT Provider Note   CSN: [id number] Arrival date & time: 01/31/17  [id number]     History   Chief Complaint Chief Complaint  Patient presents with  . Altered Mental Status    HPI Melissa Fischer is a 83 y.o. female.  HPI Patient brought in by family. Recently admitted with influenza. States she was initially feeling better after hospitalization. She was ambulatory for several days. She since had increased generalized weakness and is unable to ambulate. She's had ongoing cough which is nonproductive. No fever or chills. Daughter also reports increased confusion and difficulty finding words. She has had several falls over the last couple weeks but no known head trauma. Patient has finished Tamiflu course. Past Medical History:  Diagnosis Date  . Alzheimer disease   . Bronchitis   . COPD (chronic obstructive pulmonary disease) ([place])   . Depression   . Diabetes ([place])   . Hypercholesteremia   . Memory loss   . Pacemaker    [employer] DOI 2011  . Peripheral neuropathy ([place])   . Renal disease   . Shoulder pain, left   . Syncope     Patient Active Problem List   Diagnosis Date Noted  . Encephalopathy acute 02/01/2017  . Hyponatremia 01/31/2017  . Acute encephalopathy 01/31/2017  . Generalized weakness   . Influenza A 01/25/2017  . Hip fracture ([place]) 04/26/2016  . Type 2 diabetes mellitus with hyperglycemia ([place]) 04/26/2016  . COPD (chronic obstructive pulmonary disease) ([place]) 04/26/2016  . Closed right hip fracture ([place]) 04/26/2016  . Subcapital fracture of right femur ([place])   . Alzheimer's dementia with behavioral disturbance 03/31/2016  . DM (diabetes mellitus) type II controlled, neurological manifestation ([place]) 10/30/2015  . Depression 10/30/2015  . CKD (chronic kidney disease) 10/30/2015    Past Surgical History:  Procedure Laterality Date  . HIP PINNING,CANNULATED Right 04/26/2016   Procedure: CANNULATED HIP PINNING;  Surgeon: Tania Ade, MD;   Location: [facility] ORS;  Service: Orthopedics;  Laterality: Right;  . no surgical history    . PACEMAKER PLACEMENT      OB History    No data available       Home Medications    Prior to Admission medications   Medication Sig Start Date End Date Taking? Authorizing Provider  alendronate (FOSAMAX) 70 MG tablet Take 70 mg by mouth every Monday. Take with a full glass of water on an empty stomach.   Yes Historical Provider, MD  buPROPion (WELLBUTRIN) 75 MG tablet Take 75 mg by mouth 2 (two) times daily.    Yes Historical Provider, MD  calcium-vitamin D (OSCAL WITH D) 250-125 MG-UNIT tablet Take 1 tablet by mouth daily.    Yes Historical Provider, MD  ciprofloxacin (CIPRO) 500 MG tablet Take 500 mg by mouth 2 (two) times daily.   Yes Historical Provider, MD  dextromethorphan (DELSYM) 30 MG/5ML liquid Take 60 mg by mouth every 12 (twelve) hours as needed for cough.  01/29/17  Yes Historical Provider, MD  fexofenadine (ALLEGRA) 180 MG tablet Take 180 mg by mouth daily.    Yes Historical Provider, MD  Fluticasone-Salmeterol (ADVAIR) 250-50 MCG/DOSE AEPB Inhale 1 puff into the lungs 2 (two) times daily.   Yes Historical Provider, MD  ibuprofen (ADVIL,MOTRIN) 600 MG tablet Take 1 tablet (600 mg total) by mouth every 8 (eight) hours as needed for moderate pain. 01/26/17  Yes Mariel Aloe, MD  metFORMIN (GLUCOPHAGE-XR) 500 MG 24 hr tablet Take 500 mg by mouth daily with breakfast.  01/23/16  Yes Historical Provider, MD  Multiple Vitamin (MULTIVITAMIN WITH MINERALS) TABS tablet Take 1 tablet by mouth daily.   Yes Historical Provider, MD    Family History Family History  Problem Relation Age of Onset  . Diabetes Mother   . Dementia Neg Hx     Social History Social History  Substance Use Topics  . Smoking status: Former Smoker    Quit date: 12/29/1974  . Smokeless tobacco: Never Used  . Alcohol use 0.0 oz/week     Comment: 1 drinks per day (10/29/15)     Allergies   Aricept [donepezil]; Exelon  [rivastigmine]; Namenda [memantine]; Penicillins; Statins; and Sulfa antibiotics   Review of Systems Review of Systems  Unable to perform ROS: Dementia     Physical Exam Updated Vital Signs BP (!) 145/101   Pulse 83   Temp 98.6 F (37 C) (Axillary)   Resp 16   Ht 5' 8.5" (1.74 m)   Wt 159 lb 2.8 oz (72.2 kg)   SpO2 96%   BMI 23.85 kg/m   Physical Exam  Constitutional: She appears well-developed and well-nourished. No distress.  HENT:  Head: Normocephalic and atraumatic.  Mouth/Throat: Oropharynx is clear and moist. No oropharyngeal exudate.  Mildly dry oropharynx. No obvious head trauma.  Eyes: EOM are normal. Pupils are equal, round, and reactive to light. Right eye exhibits no discharge. Left eye exhibits no discharge.  Neck: Normal range of motion. Neck supple.  No posterior midline cervical tenderness to palpation. No meningismus.  Cardiovascular: Normal rate and regular rhythm.  Exam reveals no gallop and no friction rub.   No murmur heard. Pulmonary/Chest: Effort normal. She has wheezes (diffuse expiratory wheezes).  Abdominal: Soft. Bowel sounds are normal. There is no tenderness. There is no rebound and no guarding.  Musculoskeletal: Normal range of motion. She exhibits no edema or tenderness.  No midline thoracic or lumbar tenderness. Pelvis stable. 2+ distal pulses all extremities.  Neurological: She is alert.  Patient is confused but alert. Following some commands. 5/5 motor in all extremities. Sensation intact. No facial asymmetry  Skin: Skin is warm and dry. Capillary refill takes less than 2 seconds. No rash noted. No erythema.  Nursing note and vitals reviewed.    ED Treatments / Results  Labs (all labs ordered are listed, but only abnormal results are displayed) Labs Reviewed  URINE CULTURE - Abnormal; Notable for the following:       Result Value   Culture   (*)    Value: <10,000 COLONIES/mL INSIGNIFICANT GROWTH Performed at [facility], [address] [place], [place] [address]    All other components within normal limits  COMPREHENSIVE METABOLIC PANEL - Abnormal; Notable for the following:    Sodium 123 (*)    Chloride 90 (*)    Glucose, Bld 167 (*)    All other components within normal limits  CBC - Abnormal; Notable for the following:    HCT 33.8 (*)    MCHC 36.4 (*)    All other components within normal limits  URINALYSIS, ROUTINE W REFLEX MICROSCOPIC - Abnormal; Notable for the following:    Glucose, UA >=500 (*)    All other components within normal limits  BASIC METABOLIC PANEL - Abnormal; Notable for the following:    Sodium 125 (*)    Chloride 92 (*)    Glucose, Bld 152 (*)    Calcium 8.7 (*)    All other components within normal limits  BASIC  METABOLIC PANEL - Abnormal; Notable for the following:    Sodium 124 (*)    Chloride 93 (*)    Glucose, Bld 172 (*)    Calcium 8.5 (*)    All other components within normal limits  BASIC METABOLIC PANEL - Abnormal; Notable for the following:    Sodium 123 (*)    Chloride 91 (*)    Glucose, Bld 206 (*)    Calcium 8.4 (*)    All other components within normal limits  OSMOLALITY - Abnormal; Notable for the following:    Osmolality 264 (*)    All other components within normal limits  GLUCOSE, CAPILLARY - Abnormal; Notable for the following:    Glucose-Capillary 161 (*)    All other components within normal limits  GLUCOSE, CAPILLARY - Abnormal; Notable for the following:    Glucose-Capillary 183 (*)    All other components within normal limits  GLUCOSE, CAPILLARY - Abnormal; Notable for the following:    Glucose-Capillary 273 (*)    All other components within normal limits  BASIC METABOLIC PANEL - Abnormal; Notable for the following:    Sodium 125 (*)    Chloride 93 (*)    Glucose, Bld 156 (*)    Calcium 8.5 (*)    All other components within normal limits  GLUCOSE, CAPILLARY - Abnormal; Notable for the following:    Glucose-Capillary 115 (*)    All  other components within normal limits  GLUCOSE, CAPILLARY - Abnormal; Notable for the following:    Glucose-Capillary 169 (*)    All other components within normal limits  GLUCOSE, CAPILLARY - Abnormal; Notable for the following:    Glucose-Capillary 182 (*)    All other components within normal limits  BASIC METABOLIC PANEL - Abnormal; Notable for the following:    Sodium 120 (*)    Chloride 88 (*)    Glucose, Bld 181 (*)    Calcium 8.6 (*)    All other components within normal limits  SODIUM - Abnormal; Notable for the following:    Sodium 119 (*)    All other components within normal limits  SODIUM - Abnormal; Notable for the following:    Sodium 121 (*)    All other components within normal limits  SODIUM - Abnormal; Notable for the following:    Sodium 130 (*)    All other components within normal limits  SODIUM - Abnormal; Notable for the following:    Sodium 122 (*)    All other components within normal limits  GLUCOSE, CAPILLARY - Abnormal; Notable for the following:    Glucose-Capillary 248 (*)    All other components within normal limits  GLUCOSE, CAPILLARY - Abnormal; Notable for the following:    Glucose-Capillary 167 (*)    All other components within normal limits  SODIUM - Abnormal; Notable for the following:    Sodium 124 (*)    All other components within normal limits  GLUCOSE, CAPILLARY - Abnormal; Notable for the following:    Glucose-Capillary 165 (*)    All other components within normal limits  CBC - Abnormal; Notable for the following:    HCT 34.2 (*)    All other components within normal limits  BASIC METABOLIC PANEL - Abnormal; Notable for the following:    Sodium 123 (*)    Chloride 90 (*)    Glucose, Bld 170 (*)    Calcium 8.7 (*)    All other components within normal limits  SODIUM - Abnormal;  Notable for the following:    Sodium 123 (*)    All other components within normal limits  GLUCOSE, CAPILLARY - Abnormal; Notable for the following:     Glucose-Capillary 182 (*)    All other components within normal limits  GLUCOSE, CAPILLARY - Abnormal; Notable for the following:    Glucose-Capillary 248 (*)    All other components within normal limits  SODIUM - Abnormal; Notable for the following:    Sodium 124 (*)    All other components within normal limits  SODIUM - Abnormal; Notable for the following:    Sodium 125 (*)    All other components within normal limits  GLUCOSE, CAPILLARY - Abnormal; Notable for the following:    Glucose-Capillary 151 (*)    All other components within normal limits  SODIUM - Abnormal; Notable for the following:    Sodium 126 (*)    All other components within normal limits  GLUCOSE, CAPILLARY - Abnormal; Notable for the following:    Glucose-Capillary 185 (*)    All other components within normal limits  GLUCOSE, CAPILLARY - Abnormal; Notable for the following:    Glucose-Capillary 217 (*)    All other components within normal limits  CBG MONITORING, ED - Abnormal; Notable for the following:    Glucose-Capillary 162 (*)    All other components within normal limits  MRSA PCR SCREENING  TSH  AMMONIA  SODIUM, URINE, RANDOM  OSMOLALITY, URINE  CORTISOL-AM, BLOOD  CORTISOL  BASIC METABOLIC PANEL  BASIC METABOLIC PANEL  BASIC METABOLIC PANEL  I-STAT TROPOININ, ED    EKG  EKG Interpretation  Date/Time:  Saturday January 31 2017 20:24:38 EST Ventricular Rate:  82 PR Interval:    QRS Duration: 96 QT Interval:  400 QTC Calculation: 468 R Axis:   -53 Text Interpretation:  Sinus rhythm Left anterior fascicular block Anterior infarct, old Confirmed by Lita Mains  MD, Dennisse Swader ([id number]) on 01/31/2017 8:32:11 PM       Radiology No results found.  Procedures Procedures (including critical care time)  Medications Ordered in ED Medications  dextromethorphan (DELSYM) 30 MG/5ML liquid 60 mg (60 mg Oral Given 02/02/17 1825)  calcium-vitamin D (OSCAL WITH D) 500-200 MG-UNIT per tablet 1 tablet  (1 tablet Oral Given 02/03/17 1029)  mometasone-formoterol (DULERA) 200-5 MCG/ACT inhaler 2 puff (2 puffs Inhalation Given 02/03/17 1955)  multivitamin with minerals tablet 1 tablet (1 tablet Oral Given 02/03/17 1000)  loratadine (CLARITIN) tablet 10 mg (10 mg Oral Given 02/03/17 1029)  acetaminophen (TYLENOL) tablet 650 mg (not administered)    Or  acetaminophen (TYLENOL) suppository 650 mg (not administered)  polyethylene glycol (MIRALAX / GLYCOLAX) packet 17 g (not administered)  ondansetron (ZOFRAN) tablet 4 mg (not administered)    Or  ondansetron (ZOFRAN) injection 4 mg (not administered)  insulin aspart (novoLOG) injection 0-15 Units (3 Units Subcutaneous Given 02/03/17 1644)  insulin aspart (novoLOG) injection 0-5 Units (0 Units Subcutaneous Not Given 02/03/17 2221)  sodium chloride flush (NS) 0.9 % injection 10-40 mL (10 mLs Intracatheter Given 02/03/17 2201)  sodium chloride flush (NS) 0.9 % injection 10-40 mL (not administered)  hydrALAZINE (APRESOLINE) injection 5 mg (5 mg Intravenous Given 02/02/17 1449)  chlorhexidine (PERIDEX) 0.12 % solution 15 mL (15 mLs Mouth Rinse Given 02/03/17 2202)  MEDLINE mouth rinse (15 mLs Mouth Rinse Given 02/03/17 1600)  hydrALAZINE (APRESOLINE) tablet 25 mg (25 mg Oral Given 02/04/17 0500)  amLODipine (NORVASC) tablet 5 mg (5 mg Oral Given 02/03/17 1029)  furosemide (LASIX) tablet  20 mg (20 mg Oral Given 02/03/17 1029)  sodium chloride tablet 2 g (2 g Oral Given 02/03/17 1644)  Chlorhexidine Gluconate Cloth 2 % PADS 6 each (not administered)  feeding supplement (GLUCERNA SHAKE) (GLUCERNA SHAKE) liquid 237 mL (not administered)  enoxaparin (LOVENOX) injection 40 mg (not administered)  0.9 %  sodium chloride infusion ( Intravenous New Bag/Given 01/31/17 2218)  hydrALAZINE (APRESOLINE) injection 5 mg (5 mg Intravenous Given 02/02/17 0449)     Initial Impression / Assessment and Plan / ED Course  I have reviewed the triage vital signs and the nursing notes.  Pertinent labs  & imaging results that were available during my care of the patient were reviewed by me and considered in my medical decision making (see chart for details).    Patient with persistent hyponatremia. This likely is contributing to her symptoms. Discussed with hospitalist and will see patient in the emergency department and admit.   Final Clinical Impressions(s) / ED Diagnoses   Final diagnoses:  Generalized weakness  Hyponatremia    New Prescriptions Current Discharge Medication List       Julianne Rice, MD 02/04/17 [phone]

## 2017-01-31 NOTE — ED Notes (Addendum)
Ambulated patient to the restroom using the steady. Patient became very disoriented and anxious in the restroom, was unable to follow instructions, and became irritated. Urine sample was unsuccessful. Would recommend using bedpan.

## 2017-02-01 DIAGNOSIS — G309 Alzheimer's disease, unspecified: Secondary | ICD-10-CM | POA: Diagnosis not present

## 2017-02-01 DIAGNOSIS — G308 Other Alzheimer's disease: Secondary | ICD-10-CM | POA: Diagnosis not present

## 2017-02-01 DIAGNOSIS — F0281 Dementia in other diseases classified elsewhere with behavioral disturbance: Secondary | ICD-10-CM | POA: Diagnosis not present

## 2017-02-01 DIAGNOSIS — E222 Syndrome of inappropriate secretion of antidiuretic hormone: Secondary | ICD-10-CM | POA: Diagnosis not present

## 2017-02-01 DIAGNOSIS — Z833 Family history of diabetes mellitus: Secondary | ICD-10-CM | POA: Diagnosis not present

## 2017-02-01 DIAGNOSIS — R509 Fever, unspecified: Secondary | ICD-10-CM | POA: Diagnosis not present

## 2017-02-01 DIAGNOSIS — R278 Other lack of coordination: Secondary | ICD-10-CM | POA: Diagnosis not present

## 2017-02-01 DIAGNOSIS — E860 Dehydration: Secondary | ICD-10-CM | POA: Diagnosis present

## 2017-02-01 DIAGNOSIS — E871 Hypo-osmolality and hyponatremia: Secondary | ICD-10-CM | POA: Diagnosis not present

## 2017-02-01 DIAGNOSIS — G301 Alzheimer's disease with late onset: Secondary | ICD-10-CM | POA: Diagnosis not present

## 2017-02-01 DIAGNOSIS — Z950 Presence of cardiac pacemaker: Secondary | ICD-10-CM | POA: Diagnosis not present

## 2017-02-01 DIAGNOSIS — R296 Repeated falls: Secondary | ICD-10-CM | POA: Diagnosis present

## 2017-02-01 DIAGNOSIS — G9341 Metabolic encephalopathy: Secondary | ICD-10-CM | POA: Diagnosis present

## 2017-02-01 DIAGNOSIS — Z79899 Other long term (current) drug therapy: Secondary | ICD-10-CM | POA: Diagnosis not present

## 2017-02-01 DIAGNOSIS — Z9181 History of falling: Secondary | ICD-10-CM | POA: Diagnosis not present

## 2017-02-01 DIAGNOSIS — I10 Essential (primary) hypertension: Secondary | ICD-10-CM | POA: Diagnosis present

## 2017-02-01 DIAGNOSIS — F321 Major depressive disorder, single episode, moderate: Secondary | ICD-10-CM | POA: Diagnosis not present

## 2017-02-01 DIAGNOSIS — Z882 Allergy status to sulfonamides status: Secondary | ICD-10-CM | POA: Diagnosis not present

## 2017-02-01 DIAGNOSIS — Z87891 Personal history of nicotine dependence: Secondary | ICD-10-CM | POA: Diagnosis not present

## 2017-02-01 DIAGNOSIS — E1142 Type 2 diabetes mellitus with diabetic polyneuropathy: Secondary | ICD-10-CM | POA: Diagnosis present

## 2017-02-01 DIAGNOSIS — R066 Hiccough: Secondary | ICD-10-CM | POA: Diagnosis not present

## 2017-02-01 DIAGNOSIS — I444 Left anterior fascicular block: Secondary | ICD-10-CM | POA: Diagnosis present

## 2017-02-01 DIAGNOSIS — E1165 Type 2 diabetes mellitus with hyperglycemia: Secondary | ICD-10-CM | POA: Diagnosis not present

## 2017-02-01 DIAGNOSIS — G9340 Encephalopathy, unspecified: Secondary | ICD-10-CM | POA: Diagnosis not present

## 2017-02-01 DIAGNOSIS — R262 Difficulty in walking, not elsewhere classified: Secondary | ICD-10-CM | POA: Diagnosis not present

## 2017-02-01 DIAGNOSIS — Z880 Allergy status to penicillin: Secondary | ICD-10-CM | POA: Diagnosis not present

## 2017-02-01 DIAGNOSIS — F419 Anxiety disorder, unspecified: Secondary | ICD-10-CM | POA: Diagnosis present

## 2017-02-01 DIAGNOSIS — F329 Major depressive disorder, single episode, unspecified: Secondary | ICD-10-CM | POA: Diagnosis present

## 2017-02-01 DIAGNOSIS — Z881 Allergy status to other antibiotic agents status: Secondary | ICD-10-CM | POA: Diagnosis not present

## 2017-02-01 DIAGNOSIS — E1129 Type 2 diabetes mellitus with other diabetic kidney complication: Secondary | ICD-10-CM | POA: Diagnosis not present

## 2017-02-01 DIAGNOSIS — Z888 Allergy status to other drugs, medicaments and biological substances status: Secondary | ICD-10-CM | POA: Diagnosis not present

## 2017-02-01 DIAGNOSIS — J449 Chronic obstructive pulmonary disease, unspecified: Secondary | ICD-10-CM | POA: Diagnosis present

## 2017-02-01 DIAGNOSIS — R410 Disorientation, unspecified: Secondary | ICD-10-CM | POA: Diagnosis not present

## 2017-02-01 DIAGNOSIS — R531 Weakness: Secondary | ICD-10-CM | POA: Diagnosis not present

## 2017-02-01 DIAGNOSIS — Z7984 Long term (current) use of oral hypoglycemic drugs: Secondary | ICD-10-CM | POA: Diagnosis not present

## 2017-02-01 LAB — BASIC METABOLIC PANEL
Anion gap: 8 (ref 5–15)
Anion gap: 9 (ref 5–15)
Anion gap: 8 (ref 5–15)
Anion gap: 6 (ref 5–15)
BUN: 11 mg/dL (ref 6–20)
BUN: 10 mg/dL (ref 6–20)
BUN: 10 mg/dL (ref 6–20)
BUN: 13 mg/dL (ref 6–20)
CHLORIDE: 91 mmol/L — AB (ref 101–111)
CHLORIDE: 92 mmol/L — AB (ref 101–111)
CHLORIDE: 93 mmol/L — AB (ref 101–111)
CO2: 26 mmol/L (ref 22–32)
CO2: 22 mmol/L (ref 22–32)
CO2: 24 mmol/L (ref 22–32)
CO2: 25 mmol/L (ref 22–32)
CREATININE: 0.66 mg/dL (ref 0.44–1.00)
CREATININE: 0.69 mg/dL (ref 0.44–1.00)
CREATININE: 0.75 mg/dL (ref 0.44–1.00)
CREATININE: 0.83 mg/dL (ref 0.44–1.00)
Calcium: 8.7 mg/dL — ABNORMAL LOW (ref 8.9–10.3)
Calcium: 8.5 mg/dL — ABNORMAL LOW (ref 8.9–10.3)
Calcium: 8.4 mg/dL — ABNORMAL LOW (ref 8.9–10.3)
Calcium: 8.5 mg/dL — ABNORMAL LOW (ref 8.9–10.3)
Chloride: 93 mmol/L — ABNORMAL LOW (ref 101–111)
GFR calc Af Amer: >60 mL/min (ref >60)
GFR calc Af Amer: >60 mL/min (ref >60)
GFR calc Af Amer: >60 mL/min (ref >60)
GFR calc Af Amer: >60 mL/min (ref >60)
GFR calc non Af Amer: >60 mL/min (ref >60)
GFR calc non Af Amer: >60 mL/min (ref >60)
GFR calc non Af Amer: >60 mL/min (ref >60)
GLUCOSE: 206 mg/dL — AB (ref 65–99)
Glucose, Bld: 152 mg/dL — ABNORMAL HIGH (ref 65–99)
Glucose, Bld: 172 mg/dL — ABNORMAL HIGH (ref 65–99)
Glucose, Bld: 156 mg/dL — ABNORMAL HIGH (ref 65–99)
Potassium: 3.7 mmol/L (ref 3.5–5.1)
Potassium: 3.9 mmol/L (ref 3.5–5.1)
Potassium: 3.8 mmol/L (ref 3.5–5.1)
Potassium: 4 mmol/L (ref 3.5–5.1)
SODIUM: 125 mmol/L — AB (ref 135–145)
SODIUM: 124 mmol/L — AB (ref 135–145)
SODIUM: 125 mmol/L — AB (ref 135–145)
Sodium: 123 mmol/L — ABNORMAL LOW (ref 135–145)

## 2017-02-01 LAB — GLUCOSE, CAPILLARY
GLUCOSE-CAPILLARY: 115 mg/dL — AB (ref 65–99)
GLUCOSE-CAPILLARY: 161 mg/dL — AB (ref 65–99)
GLUCOSE-CAPILLARY: 273 mg/dL — AB (ref 65–99)
Glucose-Capillary: 183 mg/dL — ABNORMAL HIGH (ref 65–99)
Glucose-Capillary: 169 mg/dL — ABNORMAL HIGH (ref 65–99)

## 2017-02-01 LAB — URINALYSIS, ROUTINE W REFLEX MICROSCOPIC
Bacteria, UA: NONE SEEN
Bilirubin Urine: NEGATIVE
HGB URINE DIPSTICK: NEGATIVE
Ketones, ur: NEGATIVE mg/dL
Leukocytes, UA: NEGATIVE
NITRITE: NEGATIVE
PH: 7 (ref 5.0–8.0)
PROTEIN: NEGATIVE mg/dL
Specific Gravity, Urine: 1.014 (ref 1.005–1.030)
Squamous Epithelial / LPF: NONE SEEN

## 2017-02-01 LAB — AMMONIA: Ammonia: 20 umol/L (ref 9–35)

## 2017-02-01 LAB — OSMOLALITY, URINE: Osmolality, Ur: 607 mOsm/kg (ref 300–900)

## 2017-02-01 LAB — OSMOLALITY: Osmolality: 264 mOsm/kg — ABNORMAL LOW (ref 275–295)

## 2017-02-01 LAB — TSH: TSH: 2.94 u[IU]/mL (ref 0.350–4.500)

## 2017-02-01 LAB — SODIUM, URINE, RANDOM: Sodium, Ur: 112 mmol/L

## 2017-02-01 MED ORDER — ENSURE ENLIVE PO LIQD
237.0000 mL | Freq: Two times a day (BID) | ORAL | Status: DC
  Administered 2017-02-01 – 2017-02-03 (×4): 237 mL via ORAL

## 2017-02-01 MED ORDER — SODIUM CHLORIDE 0.9 % IV SOLN
INTRAVENOUS | Status: DC
  Administered 2017-02-01 (×2): via INTRAVENOUS

## 2017-02-01 MED ORDER — SODIUM CHLORIDE 1 G PO TABS
1.0000 g | ORAL_TABLET | Freq: Two times a day (BID) | ORAL | Status: DC
  Administered 2017-02-01 – 2017-02-02 (×2): 1 g via ORAL
  Filled 2017-02-01 (×2): qty 1

## 2017-02-01 NOTE — Progress Notes (Signed)
PROGRESS NOTE    Melissa Fischer  ZGY:[id number] DOB: 04/16/1934 DOA: 01/31/2017 PCP: Mathews Argyle, MD    Brief Narrative:  Melissa Fischer is a 83 y.o. female with medical history significant for Alzheimer's dementia, type 2 diabetes mellitus, COPD, and cardiac syncope status-post pacer placement with no recurrence, now presenting to the emergency department for evaluation of generalized weakness, confusion, and nausea. Patient is accompanied by her daughter who assists with the history. The patient had been admitted on 01/25/2017 with very similar symptoms, found to have influenza a, was treated with Tamiflu and given IV hydration, and reports returning to her usual state by time of discharge. Unfortunately, she has had a recurrence in weakness and confusion over the past couple days, now with nausea as well. There is been no fevers or chills and the patient has not coughing or dyspneic. There has been no recent fall or head trauma and no recent change in her medications.      Assessment & Plan:   Principal Problem:   Acute encephalopathy Active Problems:   Depression   Alzheimer's dementia with behavioral disturbance   Type 2 diabetes mellitus with hyperglycemia (H[facility])   COPD (chronic obstructive pulmonary disease) (H[facility])   Hyponatremia  1-Acute encephalopathy;  Worsening confusion over last 3 days prior to admission.  CT head negative. No evidence of infection: chest x ray negative, UA negative.  Ammonia 20. TSH normal,  Treating hyponatremia.   2-Hyponatremia;  Sodium on admission at 123.  Initially increasing at 125, will increase IV fluids.  Went down to 123, repeat B-met this afternoon.  Urine sodium 112, urine osmolality   607, serum osmolality 264, more consistent with SIADH  Repeat sodium if low will need to stop fluids, and start water restriction  Stop Wellbutrin.   3-  DVT prophylaxis: Lovenox Code Status: full code.  Family Communication: caregiver at bedside.    Disposition Plan: remain inpatient for treatment of hyponatremia.    Consultants:   none   Procedures:   none   Antimicrobials:  none   Subjective: Alert, confuse persist, not better per caregiver.    Objective: Vitals:   01/31/17 2230 01/31/17 2245 01/31/17 2316 02/01/17 0105  BP: 147/94  168/68 (!) 167/82  Pulse: 81 80 82 74  Resp: '19 17 13 12  ' Temp:    98.4 F (36.9 C)  TempSrc:    Oral  SpO2: 99% 99% 99% 99%  Height:    5' 8.5" (1.74 m)   No intake or output data in the 24 hours ending 02/01/17 0817 There were no vitals filed for this visit.  Examination:  General exam: Appears calm and comfortable  Respiratory system: Clear to auscultation. Respiratory effort normal. Cardiovascular system: S1 & S2 heard, RRR. No JVD, murmurs, rubs, gallops or clicks. No pedal edema. Gastrointestinal system: Abdomen is nondistended, soft and nontender. No organomegaly or masses felt. Normal bowel sounds heard. Central nervous system: Alert not oriented,  No focal neurological deficits. Extremities: Symmetric 5 x 5 power. Skin: No rashes, lesions or ulcers Psychiatry: confuse    Data Reviewed: I have personally reviewed following labs and imaging studies  CBC:  Recent Labs Lab 01/26/17 0401 01/31/17 2010  WBC 3.2* 9.5  HGB 12.6 12.3  HCT 35.6* 33.8*  MCV 86.8 82.2  PLT 157 591   Basic Metabolic Panel:  Recent Labs Lab 01/26/17 0401 01/31/17 2010 02/01/17 0041 02/01/17 0423  NA 128* 123* 125* 124*  K 3.9 4.6 3.7 3.9  CL 99*  90* 92* 93*  CO2 '23 23 25 22  ' GLUCOSE 134* 167* 152* 172*  BUN '11 15 13 10  ' CREATININE 0.80 0.84 0.69 0.75  CALCIUM 8.1* 9.3 8.7* 8.5*   GFR: Estimated Creatinine Clearance: 57.6 mL/min (by C-G formula based on SCr of 0.75 mg/dL). Liver Function Tests:  Recent Labs Lab 01/31/17 2010  AST 37  ALT 24  ALKPHOS 55  BILITOT 0.8  PROT 7.3  ALBUMIN 4.2   No results for input(s): LIPASE, AMYLASE in the last 168  hours.  Recent Labs Lab 02/01/17 0041  AMMONIA 20   Coagulation Profile: No results for input(s): INR, PROTIME in the last 168 hours. Cardiac Enzymes: No results for input(s): CKTOTAL, CKMB, CKMBINDEX, TROPONINI in the last 168 hours. BNP (last 3 results) No results for input(s): PROBNP in the last 8760 hours. HbA1C: No results for input(s): HGBA1C in the last 72 hours. CBG:  Recent Labs Lab 01/26/17 0813 01/26/17 1243 01/31/17 2009 02/01/17 0028 02/01/17 0727  GLUCAP 158* 123* 162* 161* 183*   Lipid Profile: No results for input(s): CHOL, HDL, LDLCALC, TRIG, CHOLHDL, LDLDIRECT in the last 72 hours. Thyroid Function Tests:  Recent Labs  02/01/17 0041  TSH 2.940   Anemia Panel: No results for input(s): VITAMINB12, FOLATE, FERRITIN, TIBC, IRON, RETICCTPCT in the last 72 hours. Sepsis Labs:  Recent Labs Lab 01/25/17 0843  LATICACIDVEN 0.87    Recent Results (from the past 240 hour(s))  MRSA PCR Screening     Status: Abnormal   Collection Time: 01/25/17  9:07 PM  Result Value Ref Range Status   MRSA by PCR INVALID RESULTS, SPECIMEN SENT FOR CULTURE (A) NEGATIVE Final    Comment:        The GeneXpert MRSA Assay (FDA approved for NASAL specimens only), is one component of a comprehensive MRSA colonization surveillance program. It is not intended to diagnose MRSA infection nor to guide or monitor treatment for MRSA infections. RESULT CALLED TO, READ BACK BY AND VERIFIED WITH: B BAKER RN @ 2633 ON 01/25/17 BY C DAVIS   MRSA culture     Status: None   Collection Time: 01/25/17  9:07 PM  Result Value Ref Range Status   Specimen Description NOSE  Final   Special Requests NONE  Final   Culture   Final    NO MRSA DETECTED Performed at [facility], [address], [place],  [address]    Report Status 01/27/2017 FINAL  Final         Radiology Studies: Dg Chest 2 View  Result Date: 01/31/2017 CLINICAL DATA:  Weakness.  Recent diagnosis of  flu. EXAM: CHEST  2 VIEW COMPARISON:  01/25/2017 FINDINGS: Left-sided pacemaker remains in place. Unchanged heart size and mediastinal contours. No pulmonary edema, focal airspace disease, pleural fluid or pneumothorax. Chronic mild compression deformity in the lower thoracic spine. Chronic change about the left proximal humerus. IMPRESSION: No acute abnormality or change from prior exam. Electronically Signed   By: Jeb Levering M.D.   On: 01/31/2017 21:27   Ct Head Wo Contrast  Result Date: 01/31/2017 CLINICAL DATA:  pt's mental status is altered, pt cannot find the words to complete a sentence. Also pt is very weak and unable to ambulate at this time. Per pt's daughter these sx were all present during last visit and admission. EXAM: CT HEAD WITHOUT CONTRAST TECHNIQUE: Contiguous axial images were obtained from the base of the skull through the vertex without intravenous contrast. COMPARISON:  04/25/2016  FINDINGS: Brain: There is central and cortical atrophy. Periventricular white matter changes are consistent with small vessel disease. There is no intra or extra-axial fluid collection or mass lesion. The basilar cisterns and ventricles have a normal appearance. There is no CT evidence for acute infarction or hemorrhage. Vascular: There is atherosclerotic calcification of the carotid siphons. Skull: Normal. Negative for fracture or focal lesion. Sinuses/Orbits: No acute finding. Other: None IMPRESSION: 1. Atrophy and small vessel disease. 2.  No evidence for acute intracranial abnormality. Electronically Signed   By: Nolon Nations M.D.   On: 01/31/2017 21:34        Scheduled Meds: . buPROPion  75 mg Oral BID  . calcium-vitamin D  1 tablet Oral Daily  . enoxaparin (LOVENOX) injection  40 mg Subcutaneous QHS  . insulin aspart  0-15 Units Subcutaneous TID WC  . insulin aspart  0-5 Units Subcutaneous QHS  . loratadine  10 mg Oral Daily  . mometasone-formoterol  2 puff Inhalation BID  .  multivitamin with minerals  1 tablet Oral Daily   Continuous Infusions: . sodium chloride 90 mL/hr at 02/01/17 0034     LOS: 0 days    Time spent: 35 minutes.     Elmarie Shiley, MD [facility] Pager [phone]  If 7PM-7AM, please contact night-coverage [email or web address] Password [email or web address] 02/01/2017, 8:17 AM

## 2017-02-02 LAB — URINE CULTURE

## 2017-02-02 LAB — BASIC METABOLIC PANEL
ANION GAP: 8 (ref 5–15)
BUN: 8 mg/dL (ref 6–20)
CALCIUM: 8.6 mg/dL — AB (ref 8.9–10.3)
CO2: 24 mmol/L (ref 22–32)
Chloride: 88 mmol/L — ABNORMAL LOW (ref 101–111)
Creatinine, Ser: 0.58 mg/dL (ref 0.44–1.00)
Glucose, Bld: 181 mg/dL — ABNORMAL HIGH (ref 65–99)
POTASSIUM: 3.9 mmol/L (ref 3.5–5.1)
Sodium: 120 mmol/L — ABNORMAL LOW (ref 135–145)

## 2017-02-02 LAB — MRSA PCR SCREENING: MRSA BY PCR: NEGATIVE

## 2017-02-02 LAB — SODIUM
SODIUM: 130 mmol/L — AB (ref 135–145)
Sodium: 119 mmol/L — CL (ref 135–145)
Sodium: 121 mmol/L — ABNORMAL LOW (ref 135–145)
Sodium: 122 mmol/L — ABNORMAL LOW (ref 135–145)

## 2017-02-02 LAB — GLUCOSE, CAPILLARY
GLUCOSE-CAPILLARY: 167 mg/dL — AB (ref 65–99)
GLUCOSE-CAPILLARY: 165 mg/dL — AB (ref 65–99)
Glucose-Capillary: 182 mg/dL — ABNORMAL HIGH (ref 65–99)
Glucose-Capillary: 248 mg/dL — ABNORMAL HIGH (ref 65–99)

## 2017-02-02 MED ORDER — HYDRALAZINE HCL 20 MG/ML IJ SOLN
5.0000 mg | Freq: Once | INTRAMUSCULAR | Status: AC
  Administered 2017-02-02: 5 mg via INTRAVENOUS
  Filled 2017-02-02: qty 1

## 2017-02-02 MED ORDER — CHLORHEXIDINE GLUCONATE 0.12 % MT SOLN
15.0000 mL | Freq: Two times a day (BID) | OROMUCOSAL | Status: DC
  Administered 2017-02-02 – 2017-02-06 (×8): 15 mL via OROMUCOSAL
  Filled 2017-02-02 (×7): qty 15

## 2017-02-02 MED ORDER — HYDRALAZINE HCL 20 MG/ML IJ SOLN
5.0000 mg | Freq: Four times a day (QID) | INTRAMUSCULAR | Status: DC | PRN
  Administered 2017-02-02 – 2017-02-04 (×2): 5 mg via INTRAVENOUS
  Filled 2017-02-02 (×2): qty 1

## 2017-02-02 MED ORDER — SODIUM CHLORIDE 3 % IV SOLN
INTRAVENOUS | Status: DC
  Administered 2017-02-02: 30 mL/h via INTRAVENOUS
  Filled 2017-02-02 (×2): qty 500

## 2017-02-02 MED ORDER — SODIUM CHLORIDE 0.9% FLUSH
10.0000 mL | INTRAVENOUS | Status: DC | PRN
  Administered 2017-02-05: 10 mL
  Administered 2017-02-05: 20 mL
  Filled 2017-02-02 (×2): qty 40

## 2017-02-02 MED ORDER — AMLODIPINE BESYLATE 5 MG PO TABS
5.0000 mg | ORAL_TABLET | Freq: Every day | ORAL | Status: DC
  Administered 2017-02-02 – 2017-02-04 (×3): 5 mg via ORAL
  Filled 2017-02-02 (×3): qty 1

## 2017-02-02 MED ORDER — SODIUM CHLORIDE 0.9% FLUSH
10.0000 mL | Freq: Two times a day (BID) | INTRAVENOUS | Status: DC
  Administered 2017-02-02 – 2017-02-04 (×5): 10 mL

## 2017-02-02 MED ORDER — HYDRALAZINE HCL 25 MG PO TABS
25.0000 mg | ORAL_TABLET | Freq: Three times a day (TID) | ORAL | Status: DC
  Administered 2017-02-02 – 2017-02-06 (×11): 25 mg via ORAL
  Filled 2017-02-02 (×12): qty 1

## 2017-02-02 MED ORDER — ORAL CARE MOUTH RINSE
15.0000 mL | Freq: Two times a day (BID) | OROMUCOSAL | Status: DC
  Administered 2017-02-02 – 2017-02-05 (×7): 15 mL via OROMUCOSAL

## 2017-02-02 MED ORDER — SODIUM CHLORIDE 1 G PO TABS
2.0000 g | ORAL_TABLET | Freq: Three times a day (TID) | ORAL | Status: DC
  Administered 2017-02-03: 2 g via ORAL
  Filled 2017-02-02: qty 2

## 2017-02-02 NOTE — Progress Notes (Signed)
Daughter wanted to make sure her medical records show that when pt had Na checked outpatient with PCP levels had improved to 133 - lab checked 2/1.

## 2017-02-02 NOTE — Consult Note (Signed)
Renal Service Consult Note [facility]  Syrenity Kuznicki 02/02/2017 [place] Requesting Physician:  Dr Tyrell Antonio  Reason for Consult:  Hyponatremia HPI: The patient is a 83 y.o. year-old with history of HTN, PPM, COPD and alzeimier's dementia.  History is from the pt's aide.  Patient has dementia x several yrs, has gotten worse over the last few months.  Today is even worse and unable to put sentence together which she could do 2 wks ago.  Pt lives in [facility], walks with a walker , goes to [facility] and pushes cart.  On admission her Na was low, got worse with NS IV. Now Na 120, asked to see for hypoNa+.  Pt poor historian.    Pt grew up in [facility], worked in Aeronautical [employer].  No hx of liver or heartfailure or CHF.    Old chart  4/17 - fall with R hip fracture, underewent hip pinning. Also DM, syncope sp PPM, COPD and Alzhemier's dementia  Jan 28-29, 2018 > gen'd weakness , found to have flu A+ w AMS, stable COPD and DM  ROS  denies CP  no joint pain   no HA  no blurry vision  no rash  no diarrhea  no nausea/ vomiting  no dysuria  no difficulty voiding  no change in urine color    Past Medical History  Past Medical History:  Diagnosis Date  . Alzheimer disease   . Bronchitis   . COPD (chronic obstructive pulmonary disease) ([place])   . Depression   . Diabetes ([place])   . Hypercholesteremia   . Memory loss   . Pacemaker    [employer] DOI 2011  . Peripheral neuropathy ([place])   . Renal disease   . Shoulder pain, left   . Syncope    Past Surgical History  Past Surgical History:  Procedure Laterality Date  . HIP PINNING,CANNULATED Right 04/26/2016   Procedure: CANNULATED HIP PINNING;  Surgeon: Tania Ade, MD;  Location: [facility];  Service: Orthopedics;  Laterality: Right;  . no surgical history    . PACEMAKER PLACEMENT     Family History  Family History  Problem Relation Age of Onset  . Diabetes Mother   . Dementia  Neg Hx    Social History  reports that she quit smoking about 42 years ago. She has never used smokeless tobacco. She reports that she drinks alcohol. She reports that she does not use drugs. Allergies  Allergies  Allergen Reactions  . Aricept [Donepezil] Other (See Comments)    Leg cramps  . Exelon [Rivastigmine] Other (See Comments)    dizziness and sad  . Namenda [Memantine] Other (See Comments)    dizziness  . Penicillins     "pt does not know its been so long" Has patient had a PCN reaction causing immediate rash, facial/tongue/throat swelling, SOB or lightheadedness with hypotension: Unknown Has patient had a PCN reaction causing severe rash involving mucus membranes or skin necrosis: Unknown Has patient had a PCN reaction that required hospitalization: Unknown Has patient had a PCN reaction occurring within the last 10 years: Unknown If all of the above answers are "NO", then may proceed with Cephalosporin use.   . Statins Other (See Comments)    unknown  . Sulfa Antibiotics Other (See Comments)    Unknown   Home medications Prior to Admission medications   Medication Sig Start Date End Date Taking? Authorizing Provider  alendronate (FOSAMAX) 70 MG tablet Take 70 mg  by mouth every Monday. Take with a full glass of water on an empty stomach.   Yes Historical Provider, MD  buPROPion (WELLBUTRIN) 75 MG tablet Take 75 mg by mouth 2 (two) times daily.    Yes Historical Provider, MD  calcium-vitamin D (OSCAL WITH D) 250-125 MG-UNIT tablet Take 1 tablet by mouth daily.    Yes Historical Provider, MD  ciprofloxacin (CIPRO) 500 MG tablet Take 500 mg by mouth 2 (two) times daily.   Yes Historical Provider, MD  dextromethorphan (DELSYM) 30 MG/5ML liquid Take 60 mg by mouth every 12 (twelve) hours as needed for cough.  01/29/17  Yes Historical Provider, MD  fexofenadine (ALLEGRA) 180 MG tablet Take 180 mg by mouth daily.    Yes Historical Provider, MD  Fluticasone-Salmeterol (ADVAIR)  250-50 MCG/DOSE AEPB Inhale 1 puff into the lungs 2 (two) times daily.   Yes Historical Provider, MD  ibuprofen (ADVIL,MOTRIN) 600 MG tablet Take 1 tablet (600 mg total) by mouth every 8 (eight) hours as needed for moderate pain. 01/26/17  Yes Mariel Aloe, MD  metFORMIN (GLUCOPHAGE-XR) 500 MG 24 hr tablet Take 500 mg by mouth daily with breakfast.  01/23/16  Yes Historical Provider, MD  Multiple Vitamin (MULTIVITAMIN WITH MINERALS) TABS tablet Take 1 tablet by mouth daily.   Yes Historical Provider, MD   Liver Function Tests  Recent Labs Lab 01/31/17 2010  AST 37  ALT 24  ALKPHOS 55  BILITOT 0.8  PROT 7.3  ALBUMIN 4.2   No results for input(s): LIPASE, AMYLASE in the last 168 hours. CBC  Recent Labs Lab 01/31/17 2010  WBC 9.5  HGB 12.3  HCT 33.8*  MCV 82.2  PLT 0000000   Basic Metabolic Panel  Recent Labs Lab 01/31/17 2010 02/01/17 0041 02/01/17 0423 02/01/17 1027 02/01/17 1457 02/02/17 1010 02/02/17 1418  NA 123* 125* 124* 123* 125* 120* 119*  K 4.6 3.7 3.9 3.8 4.0 3.9  --   CL 90* 92* 93* 91* 93* 88*  --   CO2 23 25 22 24 26 24   --   GLUCOSE 167* 152* 172* 206* 156* 181*  --   BUN 15 13 10 10 11 8   --   CREATININE 0.84 0.69 0.75 0.83 0.66 0.58  --   CALCIUM 9.3 8.7* 8.5* 8.4* 8.5* 8.6*  --    Iron/TIBC/Ferritin/ %Sat No results found for: IRON, TIBC, FERRITIN, IRONPCTSAT  Vitals:   02/02/17 1415 02/02/17 1420 02/02/17 1500 02/02/17 1528  BP: (!) 185/90 (!) 187/87 (!) 157/73   Pulse:      Resp: 13 16 13    Temp:    98.3 F (36.8 C)  TempSrc:    Oral  SpO2: 97% 98% 97%   Weight:      Height:       Exam Gen confused, somewhat frail No rash, cyanosis or gangrene Sclera anicteric, throat clear  No jvd or bruits Chest clear bilat RRR no MRG Abd soft ntnd no mass or ascites +bs GU deferred MS no joint effusions or deformity Ext no LE or UE edema / no wounds or ulcers Neuro is alert, Ox person only, minimal recall of old events Gen'd weakness all ext  3/5    Assessment: 1. Hyponatremia - appears euvolemic, with high Uosm ~650 suggesting SIADH.  Home Wellbutrin being held.  Has MS changes but some of the confusion is due to underlying dementia, difficult to parse out.  Plan is to continue hypertonic saline for now, when Na > 128  will shift over to fluid restriction w/ lasix and/or salt tabs.  No signs of CHF/ liver or renal failure.  Check TSH and cortisol if not already done.  2. Dementia - mod severe 3. HTN - not on any BP meds at home.  If needed would use norvasc or hydralazine.  4. DM on oral agents 5. Hist PPM 6. Hist COPD , stable    Plan - as above  Kelly Splinter MD [employer] pager [phone]   02/02/2017, 3:58 PM

## 2017-02-02 NOTE — Progress Notes (Signed)
Peripherally Inserted Central Catheter/Midline Placement  The IV Nurse has discussed with the patient and/or persons authorized to consent for the patient, the purpose of this procedure and the potential benefits and risks involved with this procedure.  The benefits include less needle sticks, lab draws from the catheter, and the patient may be discharged home with the catheter. Risks include, but not limited to, infection, bleeding, blood clot (thrombus formation), and puncture of an artery; nerve damage and irregular heartbeat and possibility to perform a PICC exchange if needed/ordered by physician.  Alternatives to this procedure were also discussed.  Bard Power PICC patient education guide, fact sheet on infection prevention and patient information card has been provided to patient /or left at bedside.    PICC/Midline Placement Documentation  PICC Double Lumen [id number] PICC Right Basilic 41 cm 0 cm (Active)  Indication for Insertion or Continuance of Line Administration of hyperosmolar/irritating solutions (i.e. TPN, Vancomycin, etc.) 02/02/2017  2:00 PM  Exposed Catheter (cm) 0 cm 02/02/2017  2:00 PM  Dressing Change Due 02/09/17 02/02/2017  2:00 PM       Melissa Fischer 02/02/2017, 2:05 PM

## 2017-02-02 NOTE — Progress Notes (Signed)
PROGRESS NOTE    Melissa Fischer  [id number] DOB: 03-16-1934 DOA: 01/31/2017 PCP: Mathews Argyle, MD    Brief Narrative:  Melissa Fischer is a 83 y.o. female with medical history significant for Alzheimer's dementia, type 2 diabetes mellitus, COPD, and cardiac syncope status-post pacer placement with no recurrence, now presenting to the emergency department for evaluation of generalized weakness, confusion, and nausea. Patient is accompanied by her daughter who assists with the history. The patient had been admitted on 01/25/2017 with very similar symptoms, found to have influenza a, was treated with Tamiflu and given IV hydration, and reports returning to her usual state by time of discharge. Unfortunately, she has had a recurrence in weakness and confusion over the past couple days, now with nausea as well. There is been no fevers or chills and the patient has not coughing or dyspneic. There has been no recent fall or head trauma and no recent change in her medications.   Assessment & Plan:   Principal Problem:   Acute encephalopathy Active Problems:   Depression   Alzheimer's dementia with behavioral disturbance   Type 2 diabetes mellitus with hyperglycemia (HCC)   COPD (chronic obstructive pulmonary disease) (HCC)   Hyponatremia   Encephalopathy acute  1-Acute encephalopathy;  Worsening confusion over last 3 days prior to admission.  CT head negative. No evidence of infection: chest x ray negative, UA negative.  Ammonia 20. TSH normal,  Patient more sleepy, still confuse, not oriented to place or person.  Sodium has decrease to 120. Nephrology consulted. Plan to treat with hypertonic fluid.   2-Hyponatremia;  Sodium on admission at 123.  Initially sodium increase to 125 with IV fluids.  suspect patient has SIADH and was also dehydrated on admission.  Sodium has decrease to 120 on IV fluids. IV fluids stopped.  Discussed with Dr Jonnie Finner, PICC line placement, 3 % hypertonic,  pharmacy to help dosing. Transfer to step down unit.  Urine sodium 112, urine osmolality   607, serum osmolality 264, more consistent with SIADH  Stop Wellbutrin.  Check cortisol level.   3-Alzheimer;    DVT prophylaxis: Lovenox Code Status: full code.  Family Communication: caregiver at bedside.  Disposition Plan: remain inpatient for treatment of hyponatremia.    Consultants:   none   Procedures:   none   Antimicrobials:  none   Subjective: More sleepy today, confuse, she is not alert to person or place. Yesterday she was more alert.    Objective: Vitals:   02/02/17 0205 02/02/17 0415 02/02/17 0600 02/02/17 0900  BP:  (!) 187/96 (!) 169/102   Pulse:  85 83   Resp:  18    Temp:  98.5 F (36.9 C)    TempSrc:  Oral    SpO2:  100% 100% 98%  Weight: 75 kg (165 lb 5.5 oz) 75 kg (165 lb 5.5 oz)    Height:        Intake/Output Summary (Last 24 hours) at 02/02/17 1149 Last data filed at 02/01/17 1858  Gross per 24 hour  Intake              360 ml  Output                0 ml  Net              360 ml   Filed Weights   02/01/17 1225 02/02/17 0205 02/02/17 0415  Weight: 74 kg (163 lb 2.3 oz) 75 kg (165 lb 5.5 oz) 75  kg (165 lb 5.5 oz)    Examination:  General exam: Appears calm and comfortable  Respiratory system: Clear to auscultation. Respiratory effort normal. Cardiovascular system: S1 & S2 heard, RRR. No JVD, murmurs, rubs, gallops or clicks. No pedal edema. Gastrointestinal system: Abdomen is nondistended, soft and nontender. No organomegaly or masses felt. Normal bowel sounds heard. Central nervous system: Alert not oriented,  No focal neurological deficits. Extremities: Symmetric 5 x 5 power. Skin: No rashes, lesions or ulcers Psychiatry: confuse, lethargic    Data Reviewed: I have personally reviewed following labs and imaging studies  CBC:  Recent Labs Lab 01/31/17 2010  WBC 9.5  HGB 12.3  HCT 33.8*  MCV 82.2  PLT 0000000   Basic  Metabolic Panel:  Recent Labs Lab 02/01/17 0041 02/01/17 0423 02/01/17 1027 02/01/17 1457 02/02/17 1010  NA 125* 124* 123* 125* 120*  K 3.7 3.9 3.8 4.0 3.9  CL 92* 93* 91* 93* 88*  CO2 25 22 24 26 24   GLUCOSE 152* 172* 206* 156* 181*  BUN 13 10 10 11 8   CREATININE 0.69 0.75 0.83 0.66 0.58  CALCIUM 8.7* 8.5* 8.4* 8.5* 8.6*   GFR: Estimated Creatinine Clearance: 57.6 mL/min (by C-G formula based on SCr of 0.58 mg/dL). Liver Function Tests:  Recent Labs Lab 01/31/17 2010  AST 37  ALT 24  ALKPHOS 55  BILITOT 0.8  PROT 7.3  ALBUMIN 4.2   No results for input(s): LIPASE, AMYLASE in the last 168 hours.  Recent Labs Lab 02/01/17 0041  AMMONIA 20   Coagulation Profile: No results for input(s): INR, PROTIME in the last 168 hours. Cardiac Enzymes: No results for input(s): CKTOTAL, CKMB, CKMBINDEX, TROPONINI in the last 168 hours. BNP (last 3 results) No results for input(s): PROBNP in the last 8760 hours. HbA1C: No results for input(s): HGBA1C in the last 72 hours. CBG:  Recent Labs Lab 02/01/17 0727 02/01/17 1232 02/01/17 1714 02/01/17 2006 02/02/17 0800  GLUCAP 183* 273* 115* 169* 182*   Lipid Profile: No results for input(s): CHOL, HDL, LDLCALC, TRIG, CHOLHDL, LDLDIRECT in the last 72 hours. Thyroid Function Tests:  Recent Labs  02/01/17 0041  TSH 2.940   Anemia Panel: No results for input(s): VITAMINB12, FOLATE, FERRITIN, TIBC, IRON, RETICCTPCT in the last 72 hours. Sepsis Labs: No results for input(s): PROCALCITON, LATICACIDVEN in the last 168 hours.  Recent Results (from the past 240 hour(s))  MRSA PCR Screening     Status: Abnormal   Collection Time: 01/25/17  9:07 PM  Result Value Ref Range Status   MRSA by PCR INVALID RESULTS, SPECIMEN SENT FOR CULTURE (A) NEGATIVE Final    Comment:        The GeneXpert MRSA Assay (FDA approved for NASAL specimens only), is one component of a comprehensive MRSA colonization surveillance program. It is  not intended to diagnose MRSA infection nor to guide or monitor treatment for MRSA infections. RESULT CALLED TO, READ BACK BY AND VERIFIED WITH: B BAKER RN @ [id number] ON 01/25/17 BY C DAVIS   MRSA culture     Status: None   Collection Time: 01/25/17  9:07 PM  Result Value Ref Range Status   Specimen Description NOSE  Final   Special Requests NONE  Final   Culture   Final    NO MRSA DETECTED Performed at [facility], [address] [place], [place] [address]    Report Status 01/27/2017 FINAL  Final  Urine culture     Status: Abnormal  Collection Time: 02/01/17 12:33 AM  Result Value Ref Range Status   Specimen Description URINE, CATHETERIZED  Final   Special Requests NONE  Final   Culture (A)  Final    <10,000 COLONIES/mL INSIGNIFICANT GROWTH Performed at [facility], [place] [address], [place], [place] [address]    Report Status 02/02/2017 FINAL  Final         Radiology Studies: Dg Chest 2 View  Result Date: 01/31/2017 CLINICAL DATA:  Weakness.  Recent diagnosis of flu. EXAM: CHEST  2 VIEW COMPARISON:  01/25/2017 FINDINGS: Left-sided pacemaker remains in place. Unchanged heart size and mediastinal contours. No pulmonary edema, focal airspace disease, pleural fluid or pneumothorax. Chronic mild compression deformity in the lower thoracic spine. Chronic change about the left proximal humerus. IMPRESSION: No acute abnormality or change from prior exam. Electronically Signed   By: Jeb Levering M.D.   On: 01/31/2017 21:27   Ct Head Wo Contrast  Result Date: 01/31/2017 CLINICAL DATA:  pt's mental status is altered, pt cannot find the words to complete a sentence. Also pt is very weak and unable to ambulate at this time. Per pt's daughter these sx were all present during last visit and admission. EXAM: CT HEAD WITHOUT CONTRAST TECHNIQUE: Contiguous axial images were obtained from the base of the skull through the vertex without intravenous contrast. COMPARISON:   04/25/2016 FINDINGS: Brain: There is central and cortical atrophy. Periventricular white matter changes are consistent with small vessel disease. There is no intra or extra-axial fluid collection or mass lesion. The basilar cisterns and ventricles have a normal appearance. There is no CT evidence for acute infarction or hemorrhage. Vascular: There is atherosclerotic calcification of the carotid siphons. Skull: Normal. Negative for fracture or focal lesion. Sinuses/Orbits: No acute finding. Other: None IMPRESSION: 1. Atrophy and small vessel disease. 2.  No evidence for acute intracranial abnormality. Electronically Signed   By: Nolon Nations M.D.   On: 01/31/2017 21:34        Scheduled Meds: . calcium-vitamin D  1 tablet Oral Daily  . enoxaparin (LOVENOX) injection  40 mg Subcutaneous QHS  . feeding supplement (ENSURE ENLIVE)  237 mL Oral BID BM  . insulin aspart  0-15 Units Subcutaneous TID WC  . insulin aspart  0-5 Units Subcutaneous QHS  . loratadine  10 mg Oral Daily  . mometasone-formoterol  2 puff Inhalation BID  . multivitamin with minerals  1 tablet Oral Daily   Continuous Infusions:    LOS: 1 day    Time spent: 35 minutes.     Elmarie Shiley, MD [facility] Pager [phone]  If 7PM-7AM, please contact night-coverage [email or web address] Password [email or web address] 02/02/2017, 11:49 AM

## 2017-02-02 NOTE — Progress Notes (Signed)
PT Cancellation Note  Patient Details Name: Melissa Fischer MRN: [id number] DOB: 1934/01/27   Cancelled Treatment:    Reason Eval/Treat Not Completed: Medical issues which prohibited therapy. Pt transferred to ICU. Will hold PT today and check back another day.    Weston Anna, MPT Pager: [phone]

## 2017-02-03 LAB — GLUCOSE, CAPILLARY
GLUCOSE-CAPILLARY: 182 mg/dL — AB (ref 65–99)
GLUCOSE-CAPILLARY: 248 mg/dL — AB (ref 65–99)
Glucose-Capillary: 151 mg/dL — ABNORMAL HIGH (ref 65–99)
Glucose-Capillary: 185 mg/dL — ABNORMAL HIGH (ref 65–99)

## 2017-02-03 LAB — BASIC METABOLIC PANEL
Anion gap: 8 (ref 5–15)
BUN: 13 mg/dL (ref 6–20)
CALCIUM: 8.7 mg/dL — AB (ref 8.9–10.3)
CO2: 25 mmol/L (ref 22–32)
CREATININE: 0.7 mg/dL (ref 0.44–1.00)
Chloride: 90 mmol/L — ABNORMAL LOW (ref 101–111)
Glucose, Bld: 170 mg/dL — ABNORMAL HIGH (ref 65–99)
Potassium: 3.8 mmol/L (ref 3.5–5.1)
SODIUM: 123 mmol/L — AB (ref 135–145)

## 2017-02-03 LAB — SODIUM
SODIUM: 124 mmol/L — AB (ref 135–145)
Sodium: 123 mmol/L — ABNORMAL LOW (ref 135–145)
Sodium: 124 mmol/L — ABNORMAL LOW (ref 135–145)
Sodium: 125 mmol/L — ABNORMAL LOW (ref 135–145)

## 2017-02-03 LAB — CORTISOL: CORTISOL PLASMA: 11.1 ug/dL

## 2017-02-03 LAB — CBC
HCT: 34.2 % — ABNORMAL LOW (ref 36.0–46.0)
Hemoglobin: 12.3 g/dL (ref 12.0–15.0)
MCH: 29.6 pg (ref 26.0–34.0)
MCHC: 36 g/dL (ref 30.0–36.0)
MCV: 82.4 fL (ref 78.0–100.0)
PLATELETS: 263 10*3/uL (ref 150–400)
RBC: 4.15 MIL/uL (ref 3.87–5.11)
RDW: 12.3 % (ref 11.5–15.5)
WBC: 10.4 10*3/uL (ref 4.0–10.5)

## 2017-02-03 LAB — CORTISOL-AM, BLOOD: CORTISOL - AM: 11.1 ug/dL (ref 6.7–22.6)

## 2017-02-03 MED ORDER — SODIUM CHLORIDE 1 G PO TABS
2.0000 g | ORAL_TABLET | Freq: Two times a day (BID) | ORAL | Status: DC
  Filled 2017-02-03: qty 2

## 2017-02-03 MED ORDER — SODIUM CHLORIDE 1 G PO TABS
2.0000 g | ORAL_TABLET | Freq: Three times a day (TID) | ORAL | Status: DC
  Administered 2017-02-03 – 2017-02-06 (×10): 2 g via ORAL
  Filled 2017-02-03 (×11): qty 2

## 2017-02-03 MED ORDER — FUROSEMIDE 20 MG PO TABS
20.0000 mg | ORAL_TABLET | Freq: Every day | ORAL | Status: DC
  Administered 2017-02-03 – 2017-02-06 (×4): 20 mg via ORAL
  Filled 2017-02-03 (×4): qty 1

## 2017-02-03 MED ORDER — CHLORHEXIDINE GLUCONATE CLOTH 2 % EX PADS: 6.0000 | MEDICATED_PAD | Freq: Every day | CUTANEOUS | Status: DC

## 2017-02-03 NOTE — Progress Notes (Addendum)
PROGRESS NOTE    Melissa Fischer  [id number] DOB: 09-18-1934 DOA: 01/31/2017 PCP: Mathews Argyle, MD    Brief Narrative:  Melissa Fischer is a 83 y.o. female with medical history significant for Alzheimer's dementia, type 2 diabetes mellitus, COPD, and cardiac syncope status-post pacer placement with no recurrence, now presenting to the emergency department for evaluation of generalized weakness, confusion, and nausea. Patient is accompanied by her daughter who assists with the history. The patient had been admitted on 01/25/2017 with very similar symptoms, found to have influenza a, was treated with Tamiflu and given IV hydration, and reports returning to her usual state by time of discharge.  Unfortunately, she has had a recurrence in weakness and confusion over the past couple days, now with nausea as well. There is been no fevers or chills and the patient has not coughing or dyspneic. There has been no recent fall or head trauma and no recent change in her medications.  Patient admitted with hyponatremia and encephalopathy. 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 Wellbutrin stopped.      Assessment & Plan:   Principal Problem:   Acute encephalopathy Active Problems:   Depression   Alzheimer's dementia with behavioral disturbance   Type 2 diabetes mellitus with hyperglycemia (HCC)   COPD (chronic obstructive pulmonary disease) (HCC)   Hyponatremia   Encephalopathy acute  1-Acute encephalopathy; Metabolic Probably related to hyponatremia and or dementia.  -Worsening confusion over last 3 days prior to admission.  -CT head negative. No evidence of infection: chest x ray negative, UA negative.  -Ammonia 20. TSH normal,  -Sodium has decrease to 120. Nephrology consulted. Patient was  treated with hypertonic fluid 2-05.   2-Hyponatremia; Related to SIADH.  -Sodium on admission at 123.  -Initially sodium increase to 125 with IV fluids.  Suspect patient has SIADH and was also  dehydrated on admission. Subsequently sodium got worse with IV fluids.  -Patient was more lethargic  and  Confuse on 2-05, sodium decrease to 120. SHe was started on 3 % hypertonic through picc line. Patient was transfer to step down unit.  -Appreciate  Dr Jonnie Finner help. -Urine sodium 112, urine osmolality   607, serum osmolality 264, more consistent with SIADH  Stop Wellbutrin.  cortisol level normal,. TSH normal.  Plan to start lasix, water restriction and sodium tablet. Marland Kitchen   3-Alzheimer;  At baseline patient is able to recognize daughter and is oriented to person.  Not oriented today.   4-HTN; started Norvasc 2-05.  DVT prophylaxis: Lovenox Code Status: full code.  Family Communication: Daughter at bedside.  Disposition Plan: remain inpatient for treatment of hyponatremia.    Consultants:   none   Procedures:   none   Antimicrobials:  none   Subjective: More alert, she is is not able to tell me her name and is not oriented to place.   Objective: Vitals:   02/03/17 1000 02/03/17 1100 02/03/17 1145 02/03/17 1200  BP: (!) 143/67 (!) 123/59  (!) 124/51  Pulse:      Resp: 18 20  12   Temp:   98.3 F (36.8 C)   TempSrc:   Oral   SpO2: 98% 98%  96%  Weight:      Height:        Intake/Output Summary (Last 24 hours) at 02/03/17 1246 Last data filed at 02/03/17 0500  Gross per 24 hour  Intake            220.5 ml  Output  0 ml  Net            220.5 ml   Filed Weights   02/02/17 0205 02/02/17 0415 02/03/17 0421  Weight: 75 kg (165 lb 5.5 oz) 75 kg (165 lb 5.5 oz) 72.7 kg (160 lb 4.4 oz)    Examination:  General exam: Appears calm and comfortable  Respiratory system: Clear to auscultation. Respiratory effort normal. Cardiovascular system: S1 & S2 heard, RRR. No JVD, murmurs, rubs, gallops or clicks. No pedal edema. Gastrointestinal system: Abdomen is nondistended, soft and nontender. No organomegaly or masses felt. Normal bowel sounds  heard. Central nervous system: Alert not oriented,  No focal neurological deficits. Extremities: Symmetric 5 x 5 power. Skin: No rashes, lesions or ulcers Psychiatry: confuse, lethargic    Data Reviewed: I have personally reviewed following labs and imaging studies  CBC:  Recent Labs Lab 01/31/17 2010  WBC 9.5  HGB 12.3  HCT 33.8*  MCV 82.2  PLT 0000000   Basic Metabolic Panel:  Recent Labs Lab 02/01/17 0423 02/01/17 1027 02/01/17 1457 02/02/17 1010  02/02/17 2011 02/02/17 2300 02/03/17 0420 02/03/17 0825 02/03/17 1119  NA 124* 123* 125* 120*  < > 130* 122* 124* 123* 123*  K 3.9 3.8 4.0 3.9  --   --   --   --  3.8  --   CL 93* 91* 93* 88*  --   --   --   --  90*  --   CO2 22 24 26 24   --   --   --   --  25  --   GLUCOSE 172* 206* 156* 181*  --   --   --   --  170*  --   BUN 10 10 11 8   --   --   --   --  13  --   CREATININE 0.75 0.83 0.66 0.58  --   --   --   --  0.70  --   CALCIUM 8.5* 8.4* 8.5* 8.6*  --   --   --   --  8.7*  --   < > = values in this interval not displayed. GFR: Estimated Creatinine Clearance: 57.6 mL/min (by C-G formula based on SCr of 0.7 mg/dL). Liver Function Tests:  Recent Labs Lab 01/31/17 2010  AST 37  ALT 24  ALKPHOS 55  BILITOT 0.8  PROT 7.3  ALBUMIN 4.2   No results for input(s): LIPASE, AMYLASE in the last 168 hours.  Recent Labs Lab 02/01/17 0041  AMMONIA 20   Coagulation Profile: No results for input(s): INR, PROTIME in the last 168 hours. Cardiac Enzymes: No results for input(s): CKTOTAL, CKMB, CKMBINDEX, TROPONINI in the last 168 hours. BNP (last 3 results) No results for input(s): PROBNP in the last 8760 hours. HbA1C: No results for input(s): HGBA1C in the last 72 hours. CBG:  Recent Labs Lab 02/02/17 1259 02/02/17 1513 02/02/17 2133 02/03/17 0841 02/03/17 1119  GLUCAP 248* 167* 165* 182* 248*   Lipid Profile: No results for input(s): CHOL, HDL, LDLCALC, TRIG, CHOLHDL, LDLDIRECT in the last 72  hours. Thyroid Function Tests:  Recent Labs  02/01/17 0041  TSH 2.940   Anemia Panel: No results for input(s): VITAMINB12, FOLATE, FERRITIN, TIBC, IRON, RETICCTPCT in the last 72 hours. Sepsis Labs: No results for input(s): PROCALCITON, LATICACIDVEN in the last 168 hours.  Recent Results (from the past 240 hour(s))  MRSA PCR Screening     Status: Abnormal   Collection Time:  01/25/17  9:07 PM  Result Value Ref Range Status   MRSA by PCR INVALID RESULTS, SPECIMEN SENT FOR CULTURE (A) NEGATIVE Final    Comment:        The GeneXpert MRSA Assay (FDA approved for NASAL specimens only), is one component of a comprehensive MRSA colonization surveillance program. It is not intended to diagnose MRSA infection nor to guide or monitor treatment for MRSA infections. RESULT CALLED TO, READ BACK BY AND VERIFIED WITH: B BAKER RN @ [id number] ON 01/25/17 BY C DAVIS   MRSA culture     Status: None   Collection Time: 01/25/17  9:07 PM  Result Value Ref Range Status   Specimen Description NOSE  Final   Special Requests NONE  Final   Culture   Final    NO MRSA DETECTED Performed at [facility], [address] [place], [place] [address]    Report Status 01/27/2017 FINAL  Final  Urine culture     Status: Abnormal   Collection Time: 02/01/17 12:33 AM  Result Value Ref Range Status   Specimen Description URINE, CATHETERIZED  Final   Special Requests NONE  Final   Culture (A)  Final    <10,000 COLONIES/mL INSIGNIFICANT GROWTH Performed at [facility] [address] [place], [place] [address]    Report Status 02/02/2017 FINAL  Final  MRSA PCR Screening     Status: None   Collection Time: 02/02/17  2:00 PM  Result Value Ref Range Status   MRSA by PCR NEGATIVE NEGATIVE Final    Comment:        The GeneXpert MRSA Assay (FDA approved for NASAL specimens only), is one component of a comprehensive MRSA colonization surveillance program. It is not intended to diagnose  MRSA infection nor to guide or monitor treatment for MRSA infections.          Radiology Studies: No results found.      Scheduled Meds: . amLODipine  5 mg Oral Daily  . calcium-vitamin D  1 tablet Oral Daily  . chlorhexidine  15 mL Mouth Rinse BID  . enoxaparin (LOVENOX) injection  40 mg Subcutaneous QHS  . feeding supplement (ENSURE ENLIVE)  237 mL Oral BID BM  . furosemide  20 mg Oral Daily  . hydrALAZINE  25 mg Oral Q8H  . insulin aspart  0-15 Units Subcutaneous TID WC  . insulin aspart  0-5 Units Subcutaneous QHS  . loratadine  10 mg Oral Daily  . mouth rinse  15 mL Mouth Rinse q12n4p  . mometasone-formoterol  2 puff Inhalation BID  . multivitamin with minerals  1 tablet Oral Daily  . sodium chloride flush  10-40 mL Intracatheter Q12H  . sodium chloride  2 g Oral TID WC   Continuous Infusions:    LOS: 2 days    Time spent: 35 minutes.     Elmarie Shiley, MD [facility] Pager [phone]  If 7PM-7AM, please contact night-coverage [email or web address] Password [email or web address] 02/03/2017, 12:46 PM

## 2017-02-03 NOTE — Evaluation (Signed)
Physical Therapy Evaluation Patient Details Name: Melissa Fischer MRN: [id number] DOB: 02-28-1934 Today's Date: 02/03/2017   History of Present Illness  83 y.o. female admitted with acute encephalopathy, hyponatremia,  confusion. Hx of Alzheimer's dementia, COPD, T2DM, PPM, R hip pinning 2017     Clinical Impression  On eval, pt required Mod assist +2 for mobility. Pt is still confused (different from baseline) and weak. She had difficulty following 1 step commands on today. She did not tolerate OOB activity well. At this time, recommendation is for ST rehab at [facility]. Will follow and progress activity as tolerated.     Follow Up Recommendations SNF (if family is agreeable and depending on progress)    Equipment Recommendations  None recommended by PT    Recommendations for Other Services OT consult     Precautions / Restrictions Precautions Precautions: Fall Restrictions Weight Bearing Restrictions: No      Mobility  Bed Mobility Overal bed mobility: Needs Assistance Bed Mobility: Supine to Sit;Sit to Supine     Supine to sit: Mod assist;HOB elevated Sit to supine: Mod assist;HOB elevated   General bed mobility comments: Assist for trunk and LEs. Mod multimodal cues required. Utilized bedpad for scooting, positioning. Increased time.   Transfers Overall transfer level: Needs assistance Equipment used: Rolling walker (2 wheeled) Transfers: Sit to/from Stand Sit to Stand: Mod assist;+2 physical assistance;+2 safety/equipment;From elevated surface         General transfer comment: Assist to rise, stabilize, control descent. Mod multimodal cues required. Pt stood EOB for ~30 seconds with a RW.   Ambulation/Gait             General Gait Details: Attempted to take a few steps forward. Pt was only able to take 1 step forward before she had to be assisted back to EOB then onto bed.   Stairs            Wheelchair Mobility    Modified Rankin (Stroke Patients  Only)       Balance Overall balance assessment: Needs assistance           Standing balance-Leahy Scale: Poor                               Pertinent Vitals/Pain Pain Assessment: No/denies pain    Home Living Family/patient expects to be discharged to:: Unsure Living Arrangements: Alone             Home Equipment: Walker - 2 wheels;Walker - 4 wheels Additional Comments: caregivers 7days a wk, 8am-4pm and 7pm-10pm; pt is alone 10p to 8a and is able to amb to bathroom at night I'ly    Prior Function Level of Independence: Needs assistance;Independent with assistive device(s)   Gait / Transfers Assistance Needed: prefers 2 wheeled walker, also furniture walks           Hand Dominance        Extremity/Trunk Assessment   Upper Extremity Assessment Upper Extremity Assessment: Generalized weakness    Lower Extremity Assessment Lower Extremity Assessment: Generalized weakness    Cervical / Trunk Assessment Cervical / Trunk Assessment: Kyphotic  Communication   Communication: No difficulties  Cognition Arousal/Alertness: Awake/alert Behavior During Therapy: Anxious Overall Cognitive Status: Impaired/Different from baseline Area of Impairment: Problem solving;Following commands       Following Commands: Follows one step commands inconsistently     Problem Solving: Requires tactile cues;Requires verbal cues;Difficulty sequencing;Decreased initiation;Slow processing General Comments: Pt  has a history of Alz however cognition is more impaired than baseline    General Comments      Exercises     Assessment/Plan    PT Assessment Patient needs continued PT services  PT Problem List Decreased strength;Decreased mobility;Decreased balance;Decreased activity tolerance;Decreased knowledge of use of DME;Decreased cognition          PT Treatment Interventions DME instruction;Gait training;Therapeutic activities;Therapeutic  exercise;Patient/family education;Functional mobility training;Balance training    PT Goals (Current goals can be found in the Care Plan section)  Acute Rehab PT Goals Patient Stated Goal: pt unable to state.  PT Goal Formulation: With patient Time For Goal Achievement: 02/17/17 Potential to Achieve Goals: Good    Frequency Min 3X/week   Barriers to discharge        Co-evaluation               End of Session Equipment Utilized During Treatment: Gait belt Activity Tolerance:  (Limited by weakness, confusion) Patient left: in bed;with call bell/phone within reach;with family/visitor present;with bed alarm set           Time: 1130-1146 PT Time Calculation (min) (ACUTE ONLY): 16 min   Charges:   PT Evaluation $PT Eval Moderate Complexity: 1 Procedure     PT G Codes:        Weston Anna, MPT Pager: [phone]

## 2017-02-03 NOTE — Progress Notes (Signed)
Inpatient Diabetes Program Recommendations  AACE/ADA: New Consensus Statement on Inpatient Glycemic Control (2015)  Target Ranges:  Prepandial:   less than 140 mg/dL      Peak postprandial:   less than 180 mg/dL (1-2 hours)      Critically ill patients:  140 - 180 mg/dL   Results for [name], [name] (MRN [id number]) as of 02/03/2017 11:48  Ref. Range 02/02/2017 08:00 02/02/2017 12:59 02/02/2017 15:13 02/02/2017 21:33 02/03/2017 08:41 02/03/2017 11:19  Glucose-Capillary Latest Ref Range: 65 - 99 mg/dL 182 (H) 248 (H) After Ensure 167 (H) 165 (H) 182 (H) 248 (H) After Ensure   Review of Glycemic Control  Diabetes history: DM 2 Outpatient Diabetes medications: Metformin 500 Daily Current orders for Inpatient glycemic control: Novolog Moderate TID + HS scale  Inpatient Diabetes Program Recommendations:  Glucose elevated in the mid 200's after Ensure supplement (unsure reasons for supplement). Patient now has Regular diet ordered.  Please consider, if appropriate, Carb modified diet and switching Ensure to Glucerna.  Thanks,  Tama Headings RN, MSN, [facility] Inpatient Diabetes Coordinator Team Pager [phone] (8a-5p)

## 2017-02-03 NOTE — Progress Notes (Signed)
Melissa Fischer Progress Note   Subjective: Na up to 130 on 3% saline, 3% stopped and then dropped to 124.  Pt more alert, still confused.   Vitals:   02/03/17 0400 02/03/17 0421 02/03/17 0500 02/03/17 0600  BP: (!) 149/70  (!) 142/97 (!) 164/118  Pulse:      Resp: 14  13 14   Temp:      TempSrc:      SpO2: 95%  96% 96%  Weight:  72.7 kg (160 lb 4.4 oz)    Height:        Inpatient medications: . amLODipine  5 mg Oral Daily  . calcium-vitamin D  1 tablet Oral Daily  . chlorhexidine  15 mL Mouth Rinse BID  . enoxaparin (LOVENOX) injection  40 mg Subcutaneous QHS  . feeding supplement (ENSURE ENLIVE)  237 mL Oral BID BM  . furosemide  20 mg Oral Daily  . hydrALAZINE  25 mg Oral Q8H  . insulin aspart  0-15 Units Subcutaneous TID WC  . insulin aspart  0-5 Units Subcutaneous QHS  . loratadine  10 mg Oral Daily  . mouth rinse  15 mL Mouth Rinse q12n4p  . mometasone-formoterol  2 puff Inhalation BID  . multivitamin with minerals  1 tablet Oral Daily  . sodium chloride flush  10-40 mL Intracatheter Q12H  . sodium chloride  2 g Oral BID WC    acetaminophen **OR** acetaminophen, dextromethorphan, hydrALAZINE, ondansetron **OR** ondansetron (ZOFRAN) IV, polyethylene glycol, sodium chloride flush  Exam: Gen confused, somewhat frail No jvd or bruits Chest clear bilat RRR no MRG Abd soft ntnd no mass or ascites +bs GU deferred MS no joint effusions or deformity Ext no LE or UE edema / no wounds or ulcers Neuro is alert, Ox person only, minimal recall of old events Gen'd weakness all ext 3/5    Assessment: 1. Hyponatremia - appears euvolemic, with high Uosm ~650 suggesting SIADH. 3% saline dc'd appropriately, will change to low dose lasix/ NaCl tabs and fluid restriction for now. Will follow.  2. Dementia - mod severe 3. HTN - not on any BP meds at home.  If needed would use norvasc or hydralazine.  4. DM on oral agents 5. Hist PPM 6. Hist COPD , stable  Plan  - as above   Kelly Splinter MD [place] pager [phone]   02/03/2017, 8:39 AM    Recent Labs Lab 02/01/17 1027 02/01/17 1457 02/02/17 1010  02/02/17 2011 02/02/17 2300 02/03/17 0420  NA 123* 125* 120*  < > 130* 122* 124*  K 3.8 4.0 3.9  --   --   --   --   CL 91* 93* 88*  --   --   --   --   CO2 24 26 24   --   --   --   --   GLUCOSE 206* 156* 181*  --   --   --   --   BUN 10 11 8   --   --   --   --   CREATININE 0.83 0.66 0.58  --   --   --   --   CALCIUM 8.4* 8.5* 8.6*  --   --   --   --   < > = values in this interval not displayed.  Recent Labs Lab 01/31/17 2010  AST 37  ALT 24  ALKPHOS 55  BILITOT 0.8  PROT 7.3  ALBUMIN 4.2    Recent Labs Lab 01/31/17  2010  WBC 9.5  HGB 12.3  HCT 33.8*  MCV 82.2  PLT 246   Iron/TIBC/Ferritin/ %Sat No results found for: IRON, TIBC, FERRITIN, IRONPCTSAT

## 2017-02-04 DIAGNOSIS — G9340 Encephalopathy, unspecified: Secondary | ICD-10-CM

## 2017-02-04 DIAGNOSIS — E871 Hypo-osmolality and hyponatremia: Secondary | ICD-10-CM

## 2017-02-04 DIAGNOSIS — G308 Other Alzheimer's disease: Secondary | ICD-10-CM

## 2017-02-04 DIAGNOSIS — F0281 Dementia in other diseases classified elsewhere with behavioral disturbance: Secondary | ICD-10-CM

## 2017-02-04 DIAGNOSIS — E1165 Type 2 diabetes mellitus with hyperglycemia: Secondary | ICD-10-CM

## 2017-02-04 LAB — GLUCOSE, CAPILLARY
GLUCOSE-CAPILLARY: 217 mg/dL — AB (ref 65–99)
GLUCOSE-CAPILLARY: 203 mg/dL — AB (ref 65–99)
Glucose-Capillary: 176 mg/dL — ABNORMAL HIGH (ref 65–99)
Glucose-Capillary: 255 mg/dL — ABNORMAL HIGH (ref 65–99)

## 2017-02-04 LAB — BASIC METABOLIC PANEL
ANION GAP: 9 (ref 5–15)
ANION GAP: 8 (ref 5–15)
Anion gap: 9 (ref 5–15)
BUN: 19 mg/dL (ref 6–20)
BUN: 15 mg/dL (ref 6–20)
BUN: 14 mg/dL (ref 6–20)
CO2: 23 mmol/L (ref 22–32)
CO2: 24 mmol/L (ref 22–32)
CO2: 25 mmol/L (ref 22–32)
Calcium: 8.5 mg/dL — ABNORMAL LOW (ref 8.9–10.3)
Calcium: 8.7 mg/dL — ABNORMAL LOW (ref 8.9–10.3)
Calcium: 8.6 mg/dL — ABNORMAL LOW (ref 8.9–10.3)
Chloride: 94 mmol/L — ABNORMAL LOW (ref 101–111)
Chloride: 92 mmol/L — ABNORMAL LOW (ref 101–111)
Chloride: 94 mmol/L — ABNORMAL LOW (ref 101–111)
Creatinine, Ser: 0.8 mg/dL (ref 0.44–1.00)
Creatinine, Ser: 0.76 mg/dL (ref 0.44–1.00)
Creatinine, Ser: 0.69 mg/dL (ref 0.44–1.00)
GFR calc Af Amer: >60 mL/min (ref >60)
GLUCOSE: 198 mg/dL — AB (ref 65–99)
Glucose, Bld: 282 mg/dL — ABNORMAL HIGH (ref 65–99)
Glucose, Bld: 180 mg/dL — ABNORMAL HIGH (ref 65–99)
POTASSIUM: 3.5 mmol/L (ref 3.5–5.1)
POTASSIUM: 3.6 mmol/L (ref 3.5–5.1)
Potassium: 3.7 mmol/L (ref 3.5–5.1)
SODIUM: 126 mmol/L — AB (ref 135–145)
SODIUM: 125 mmol/L — AB (ref 135–145)
SODIUM: 127 mmol/L — AB (ref 135–145)

## 2017-02-04 LAB — SODIUM: SODIUM: 126 mmol/L — AB (ref 135–145)

## 2017-02-04 MED ORDER — GLUCERNA SHAKE PO LIQD
237.0000 mL | Freq: Two times a day (BID) | ORAL | Status: DC
  Administered 2017-02-04 – 2017-02-06 (×2): 237 mL via ORAL
  Filled 2017-02-04 (×6): qty 237

## 2017-02-04 MED ORDER — ENOXAPARIN SODIUM 40 MG/0.4ML ~~LOC~~ SOLN
40.0000 mg | Freq: Every day | SUBCUTANEOUS | Status: DC
  Administered 2017-02-04 – 2017-02-05 (×2): 40 mg via SUBCUTANEOUS
  Filled 2017-02-04 (×2): qty 0.4

## 2017-02-04 NOTE — Progress Notes (Signed)
[facility] Progress Note   Subjective: Na 126.  Vitals:   02/04/17 1157 02/04/17 1200 02/04/17 1323 02/04/17 1501  BP:  (!) 137/57 (!) 142/69 138/72  Pulse:   100 (!) 101  Resp:  17 18 18   Temp: 98.3 F (36.8 C)  98.3 F (36.8 C) 98.8 F (37.1 C)  TempSrc: Oral  Oral Oral  SpO2:  96% 98% 98%  Weight:      Height:        Inpatient medications: . amLODipine  5 mg Oral Daily  . calcium-vitamin D  1 tablet Oral Daily  . chlorhexidine  15 mL Mouth Rinse BID  . Chlorhexidine Gluconate Cloth  6 each Topical Q0600  . enoxaparin (LOVENOX) injection  40 mg Subcutaneous QHS  . feeding supplement (GLUCERNA SHAKE)  237 mL Oral BID BM  . furosemide  20 mg Oral Daily  . hydrALAZINE  25 mg Oral Q8H  . insulin aspart  0-15 Units Subcutaneous TID WC  . insulin aspart  0-5 Units Subcutaneous QHS  . loratadine  10 mg Oral Daily  . mouth rinse  15 mL Mouth Rinse q12n4p  . mometasone-formoterol  2 puff Inhalation BID  . multivitamin with minerals  1 tablet Oral Daily  . sodium chloride flush  10-40 mL Intracatheter Q12H  . sodium chloride  2 g Oral TID WC    acetaminophen **OR** acetaminophen, dextromethorphan, hydrALAZINE, ondansetron **OR** ondansetron (ZOFRAN) IV, polyethylene glycol, sodium chloride flush  Exam: Gen confused, somewhat frail No jvd or bruits Chest clear bilat RRR no MRG Abd soft ntnd no mass or ascites +bs GU deferred MS no joint effusions or deformity Ext no LE or UE edema / no wounds or ulcers Neuro is alert, Ox person only, minimal recall of old events Gen'd weakness all ext 3/5    Assessment: 1. Hyponatremia - euvolemic w high Uosm ~650 c/w SIADH.  Wellbutrin stopped. Slow Na rise with lasix/ salt tabs/ fluid restriction. No further suggestions.  PCP can f/u in OP setting.  Will sign off.  2. Dementia - mod severe 3. HTN - not on any BP meds at home.  If needed would use norvasc or hydralazine. Avoid ACEi/ ARB with siadh.  4. DM on oral  agents 5. Hist PPM 6. Hist COPD , stable  Plan - as above   Kelly Splinter MD [facility] pager [phone]   02/04/2017, 3:52 PM    Recent Labs Lab 02/02/17 1010  02/03/17 0825  02/03/17 2210 02/04/17 0455 02/04/17 1130  NA 120*  < > 123*  < > 125* 126* 126*  K 3.9  --  3.8  --   --   --  3.7  CL 88*  --  90*  --   --   --  94*  CO2 24  --  25  --   --   --  23  GLUCOSE 181*  --  170*  --   --   --  198*  BUN 8  --  13  --   --   --  19  CREATININE 0.58  --  0.70  --   --   --  0.80  CALCIUM 8.6*  --  8.7*  --   --   --  8.5*  < > = values in this interval not displayed.  Recent Labs Lab 01/31/17 2010  AST 37  ALT 24  ALKPHOS 55  BILITOT 0.8  PROT 7.3  ALBUMIN 4.2  Recent Labs Lab 01/31/17 2010 02/03/17 1500  WBC 9.5 10.4  HGB 12.3 12.3  HCT 33.8* 34.2*  MCV 82.2 82.4  PLT 246 263   Iron/TIBC/Ferritin/ %Sat No results found for: IRON, TIBC, FERRITIN, IRONPCTSAT

## 2017-02-04 NOTE — Progress Notes (Signed)
Consulted Natalie, Associate Professor. Patient positive flu A on 1/28. Patient has completed course of tamiflu and is afebrile, so patient does not need to be on droplet precautions at this time.

## 2017-02-04 NOTE — Clinical Social Work Note (Signed)
Clinical Social Work Assessment  Patient Details  Name: Melissa Fischer MRN: [id number] Date of Birth: 01/25/1934  Date of referral:  02/04/17               Reason for consult:  Facility Placement                Permission sought to share information with:  Facility Art therapist granted to share information::  Yes, Verbal Permission Granted  Name::        Agency::     Relationship::     Contact Information:     Housing/Transportation Living arrangements for the past 2 months:  [place] of Information:  Adult Children Patient Interpreter Needed:  None Criminal Activity/Legal Involvement Pertinent to Current Situation/Hospitalization:    Significant Relationships:  Adult Children Lives with:  Facility Resident (Independent Living) Do you feel safe going back to the place where you live?  Yes Need for family participation in patient care:  Yes (Comment)  Care giving concerns:  N/A   Social Worker assessment / plan:  CSW met with pt's daughter Melissa Fischer at ph: [phone] and confirmed pt's daughter's plan for pt to return to [facility] with 24 hour care, paid for by the pt's daughter.  Pt's daughter agreed to advise CSW if this is not possible.  Pt has been living independently prior to discharge at [facility]   Employment status:  Retired Health visitor, Managed Care PT Recommendations:  [place] / Referral to community resources:  [place]  Patient/Family's Response to care:  Patient's daughter is agreeable to plan.  Pt's daugher supportive and strongly involved in pt.'s care.  Pt.'s daughter was pleasant and appreciated CSW intervention.    Patient/Family's Understanding of and Emotional Response to Diagnosis, Current Treatment, and Prognosis:  Pt's family understand current prognosis and treatment  Emotional  Assessment Appearance:  Other (Comment Required (Unknown, pt was not oriented) Attitude/Demeanor/Rapport:  Unable to Assess Affect (typically observed):  Unable to Assess Orientation:  Fluctuating Orientation (Suspected and/or reported Sundowners) Alcohol / Substance use:    Psych involvement (Current and /or in the community):     Discharge Needs  Concerns to be addressed:  No discharge needs identified Readmission within the last 30 days:  No Current discharge risk:  None Barriers to Discharge:  No Barriers Identified   Claudine Mouton, LCSWA 02/04/2017, 3:45 PM

## 2017-02-04 NOTE — Progress Notes (Signed)
PROGRESS NOTE  Melissa Fischer  [id number] DOB: 1934/02/22 DOA: 01/31/2017 PCP: Mathews Argyle, MD  Brief Narrative:   Melissa Fischer a 83 y.o.femalewith medical history significant forAlzheimer's dementia, type 2 diabetes mellitus, COPD, and cardiac syncope status-post pacer placement with no recurrence, now presenting to the emergency department for evaluation of generalized weakness, confusion, and nausea. Patient is accompanied by her daughter who assists with the history. The patient had been admitted on 01/25/2017 with very similar symptoms, found to have influenza a, was treated with Tamiflu and given IV hydration, and reports returning to her usual state by time of discharge. Unfortunately, she had a recurrence in weakness and confusion and was readmitted with hyponatremia and encephalopathy. 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 Wellbutrin has been stopped.  She was given hypertonic saline which corrected her sodium to 130 but her hyponatremia recurred after cessation of fluids.  She was started on lasix and sodium chloride tabs by nephrology and her sodium is gradually trending up.    Assessment & Plan:   Principal Problem:   Acute encephalopathy Active Problems:   Depression   Alzheimer's dementia with behavioral disturbance   Type 2 diabetes mellitus with hyperglycemia (HCC)   COPD (chronic obstructive pulmonary disease) (HCC)   Hyponatremia   Encephalopathy acute  Acute encephalopathy; Metabolic Probably related to recent tamiflu and hyponatremia with underlying dementia.  -Worsening confusion over last 3 days prior to admission.  -CT head negative. No evidence of infection: chest x ray negative, UA negative.  -Ammonia 20. TSH normal,  -Sodium gradually trending up but still confused  Hyponatremia due to SIADH, 123 on admission - Initially sodium increase to 125 with IV fluids but then decreased to 120.  - She was started on 3% hypertonic through [facility]  line. - Appreciate Nephrology, Dr Jonnie Finner assistance - Wellbutrin has been stopped - cortisol and TSH normal.  - Continue lasix, water restriction and sodium tablets -  Repeat BMP in AM -  Transfer to med-surg  Alzheimer, at baseline patient is able to recognize daughter and is oriented to person.  Not oriented today.   HTN; started Norvasc 2-05.  Diabetes mellitus, A1c 7.9 -  On regular diet -  Change ensure to glucerna -  Continue moderate dose SSI with HS   Poor oral intake -  Regular diet with supplements -  Nutrition consultation  DVT prophylaxis:  lovenox Code Status:  Full code Family Communication:  Patient alone Disposition Plan:  To SNF for ST rehab once lasix and salt tab doses stable   Consultants:   Nephrology  Procedures:  PICC line placement  Antimicrobials:  Anti-infectives    None       Subjective: Confused.  Demented.  Has headache.    Objective: Vitals:   02/04/17 0800 02/04/17 0824 02/04/17 1157 02/04/17 1200  BP: (!) 143/121   (!) 137/57  Pulse:      Resp: (!) 22   17  Temp: 98.5 F (36.9 C)  98.3 F (36.8 C)   TempSrc: Oral  Oral   SpO2: 95% 96%  96%  Weight:      Height:        Intake/Output Summary (Last 24 hours) at 02/04/17 1253 Last data filed at 02/04/17 0800  Gross per 24 hour  Intake              190 ml  Output                0 ml  Net              190 ml   Filed Weights   02/02/17 0415 02/03/17 0421 02/04/17 0329  Weight: 75 kg (165 lb 5.5 oz) 72.7 kg (160 lb 4.4 oz) 72.2 kg (159 lb 2.8 oz)    Examination:  General exam:  Adult female.  No acute distress.   HEENT:  NCAT, MMM Respiratory system: Clear to auscultation bilaterally Cardiovascular system: Regular rate and rhythm, normal 99991111. 2/6 systolic murmur, rubs, gallops or clicks.  Warm extremities Gastrointestinal system: Normal active bowel sounds, soft, nondistended, nontender. MSK:  Normal tone and bulk, no lower extremity edema Neuro:  Grossly  moves all extremities    Data Reviewed: I have personally reviewed following labs and imaging studies  CBC:  Recent Labs Lab 01/31/17 2010 02/03/17 1500  WBC 9.5 10.4  HGB 12.3 12.3  HCT 33.8* 34.2*  MCV 82.2 82.4  PLT 246 99991111   Basic Metabolic Panel:  Recent Labs Lab 02/01/17 1027 02/01/17 1457 02/02/17 1010  02/03/17 0825 02/03/17 1119 02/03/17 1650 02/03/17 2210 02/04/17 0455 02/04/17 1130  NA 123* 125* 120*  < > 123* 123* 124* 125* 126* 126*  K 3.8 4.0 3.9  --  3.8  --   --   --   --  3.7  CL 91* 93* 88*  --  90*  --   --   --   --  94*  CO2 24 26 24   --  25  --   --   --   --  23  GLUCOSE 206* 156* 181*  --  170*  --   --   --   --  198*  BUN 10 11 8   --  13  --   --   --   --  19  CREATININE 0.83 0.66 0.58  --  0.70  --   --   --   --  0.80  CALCIUM 8.4* 8.5* 8.6*  --  8.7*  --   --   --   --  8.5*  < > = values in this interval not displayed. GFR: Estimated Creatinine Clearance: 57.6 mL/min (by C-G formula based on SCr of 0.8 mg/dL). Liver Function Tests:  Recent Labs Lab 01/31/17 2010  AST 37  ALT 24  ALKPHOS 55  BILITOT 0.8  PROT 7.3  ALBUMIN 4.2   No results for input(s): LIPASE, AMYLASE in the last 168 hours.  Recent Labs Lab 02/01/17 0041  AMMONIA 20   Coagulation Profile: No results for input(s): INR, PROTIME in the last 168 hours. Cardiac Enzymes: No results for input(s): CKTOTAL, CKMB, CKMBINDEX, TROPONINI in the last 168 hours. BNP (last 3 results) No results for input(s): PROBNP in the last 8760 hours. HbA1C: No results for input(s): HGBA1C in the last 72 hours. CBG:  Recent Labs Lab 02/03/17 1119 02/03/17 1555 02/03/17 2216 02/04/17 0752 02/04/17 1155  GLUCAP 248* 151* 185* 217* 203*   Lipid Profile: No results for input(s): CHOL, HDL, LDLCALC, TRIG, CHOLHDL, LDLDIRECT in the last 72 hours. Thyroid Function Tests: No results for input(s): TSH, T4TOTAL, FREET4, T3FREE, THYROIDAB in the last 72 hours. Anemia  Panel: No results for input(s): VITAMINB12, FOLATE, FERRITIN, TIBC, IRON, RETICCTPCT in the last 72 hours. Urine analysis:    Component Value Date/Time   COLORURINE YELLOW 02/01/2017 0034   APPEARANCEUR CLEAR 02/01/2017 0034   LABSPEC 1.014 02/01/2017 0034   PHURINE 7.0 02/01/2017 0034   GLUCOSEU >=500 (A)  02/01/2017 0034   HGBUR NEGATIVE 02/01/2017 0034   BILIRUBINUR NEGATIVE 02/01/2017 0034   KETONESUR NEGATIVE 02/01/2017 0034   PROTEINUR NEGATIVE 02/01/2017 0034   NITRITE NEGATIVE 02/01/2017 0034   LEUKOCYTESUR NEGATIVE 02/01/2017 0034   Sepsis Labs: @LABRCNTIP (procalcitonin:4,lacticidven:4)  ) Recent Results (from the past 240 hour(s))  MRSA PCR Screening     Status: Abnormal   Collection Time: 01/25/17  9:07 PM  Result Value Ref Range Status   MRSA by PCR INVALID RESULTS, SPECIMEN SENT FOR CULTURE (A) NEGATIVE Final    Comment:        The GeneXpert MRSA Assay (FDA approved for NASAL specimens only), is one component of a comprehensive MRSA colonization surveillance program. It is not intended to diagnose MRSA infection nor to guide or monitor treatment for MRSA infections. RESULT CALLED TO, READ BACK BY AND VERIFIED WITH: B BAKER RN @ [id number] ON 01/25/17 BY C DAVIS   MRSA culture     Status: None   Collection Time: 01/25/17  9:07 PM  Result Value Ref Range Status   Specimen Description NOSE  Final   Special Requests NONE  Final   Culture   Final    NO MRSA DETECTED Performed at [facility], [address], [place], [place] [address]    Report Status 01/27/2017 FINAL  Final  Urine culture     Status: Abnormal   Collection Time: 02/01/17 12:33 AM  Result Value Ref Range Status   Specimen Description URINE, CATHETERIZED  Final   Special Requests NONE  Final   Culture (A)  Final    <10,000 COLONIES/mL INSIGNIFICANT GROWTH Performed at [facility] [address], [place], [place] [address]    Report Status 02/02/2017 FINAL  Final  MRSA PCR  Screening     Status: None   Collection Time: 02/02/17  2:00 PM  Result Value Ref Range Status   MRSA by PCR NEGATIVE NEGATIVE Final    Comment:        The GeneXpert MRSA Assay (FDA approved for NASAL specimens only), is one component of a comprehensive MRSA colonization surveillance program. It is not intended to diagnose MRSA infection nor to guide or monitor treatment for MRSA infections.       Radiology Studies: No results found.   Scheduled Meds: . amLODipine  5 mg Oral Daily  . calcium-vitamin D  1 tablet Oral Daily  . chlorhexidine  15 mL Mouth Rinse BID  . Chlorhexidine Gluconate Cloth  6 each Topical Q0600  . enoxaparin (LOVENOX) injection  40 mg Subcutaneous QHS  . feeding supplement (GLUCERNA SHAKE)  237 mL Oral BID BM  . furosemide  20 mg Oral Daily  . hydrALAZINE  25 mg Oral Q8H  . insulin aspart  0-15 Units Subcutaneous TID WC  . insulin aspart  0-5 Units Subcutaneous QHS  . loratadine  10 mg Oral Daily  . mouth rinse  15 mL Mouth Rinse q12n4p  . mometasone-formoterol  2 puff Inhalation BID  . multivitamin with minerals  1 tablet Oral Daily  . sodium chloride flush  10-40 mL Intracatheter Q12H  . sodium chloride  2 g Oral TID WC   Continuous Infusions:   LOS: 3 days    Time spent: 30 min    Janece Canterbury, MD [facility] Pager [phone]  If 7PM-7AM, please contact night-coverage [email or web address] Password [facility] 02/04/2017, 12:53 PM

## 2017-02-05 DIAGNOSIS — Z888 Allergy status to other drugs, medicaments and biological substances status: Secondary | ICD-10-CM

## 2017-02-05 DIAGNOSIS — Z833 Family history of diabetes mellitus: Secondary | ICD-10-CM

## 2017-02-05 DIAGNOSIS — Z950 Presence of cardiac pacemaker: Secondary | ICD-10-CM

## 2017-02-05 DIAGNOSIS — Z794 Long term (current) use of insulin: Secondary | ICD-10-CM

## 2017-02-05 DIAGNOSIS — Z79899 Other long term (current) drug therapy: Secondary | ICD-10-CM

## 2017-02-05 DIAGNOSIS — Z880 Allergy status to penicillin: Secondary | ICD-10-CM

## 2017-02-05 DIAGNOSIS — G309 Alzheimer's disease, unspecified: Secondary | ICD-10-CM

## 2017-02-05 DIAGNOSIS — Z87891 Personal history of nicotine dependence: Secondary | ICD-10-CM

## 2017-02-05 DIAGNOSIS — Z882 Allergy status to sulfonamides status: Secondary | ICD-10-CM

## 2017-02-05 LAB — BASIC METABOLIC PANEL
Anion gap: 11 (ref 5–15)
BUN: 14 mg/dL (ref 6–20)
CALCIUM: 8.6 mg/dL — AB (ref 8.9–10.3)
CO2: 22 mmol/L (ref 22–32)
Chloride: 94 mmol/L — ABNORMAL LOW (ref 101–111)
Creatinine, Ser: 0.65 mg/dL (ref 0.44–1.00)
GFR calc Af Amer: >60 mL/min (ref >60)
GLUCOSE: 212 mg/dL — AB (ref 65–99)
Potassium: 3.7 mmol/L (ref 3.5–5.1)
Sodium: 127 mmol/L — ABNORMAL LOW (ref 135–145)

## 2017-02-05 LAB — GLUCOSE, CAPILLARY
GLUCOSE-CAPILLARY: 198 mg/dL — AB (ref 65–99)
Glucose-Capillary: 260 mg/dL — ABNORMAL HIGH (ref 65–99)
Glucose-Capillary: 224 mg/dL — ABNORMAL HIGH (ref 65–99)
Glucose-Capillary: 218 mg/dL — ABNORMAL HIGH (ref 65–99)

## 2017-02-05 MED ORDER — VENLAFAXINE HCL ER 75 MG PO CP24
75.0000 mg | ORAL_CAPSULE | Freq: Every day | ORAL | Status: DC
  Administered 2017-02-06: 75 mg via ORAL
  Filled 2017-02-05: qty 1

## 2017-02-05 MED ORDER — AMLODIPINE BESYLATE 10 MG PO TABS
10.0000 mg | ORAL_TABLET | Freq: Every day | ORAL | Status: DC
  Administered 2017-02-05 – 2017-02-06 (×2): 10 mg via ORAL
  Filled 2017-02-05 (×2): qty 1

## 2017-02-05 NOTE — Care Management Important Message (Signed)
Important Message  Patient Details  Name: Melissa Fischer MRN: [id number] Date of Birth: 1934-09-28   Medicare Important Message Given:  Yes    Kerin Salen 02/05/2017, 10:35 AMImportant Message  Patient Details  Name: Melissa Fischer MRN: [id number] Date of Birth: Oct 11, 1934   Medicare Important Message Given:  Yes    Kerin Salen 02/05/2017, 10:35 AM

## 2017-02-05 NOTE — Progress Notes (Signed)
Patient started to develop rash on lower legs, cream applied to legs, and rash has since improved.  Now has consolidated to right knee.

## 2017-02-05 NOTE — Consult Note (Signed)
[facility] Face-to-Face Psychiatry Consult   Reason for Consult:  Depression and dementia Referring Physician:  Dr. Sheran Fava Patient Identification: Melissa Fischer MRN:  [id number] Principal Diagnosis: Acute encephalopathy Diagnosis:   Patient Active Problem List   Diagnosis Date Noted  . Encephalopathy acute [G93.40] 02/01/2017  . Hyponatremia [E87.1] 01/31/2017  . Acute encephalopathy [G93.40] 01/31/2017  . Generalized weakness [R53.1]   . Influenza A [J10.1] 01/25/2017  . Hip fracture ([place]) [S72.009A] 04/26/2016  . Type 2 diabetes mellitus with hyperglycemia ([place]) [E11.65] 04/26/2016  . COPD (chronic obstructive pulmonary disease) ([place]) [J44.9] 04/26/2016  . Closed right hip fracture ([place]) [S72.001A] 04/26/2016  . Subcapital fracture of right femur ([place]) [S72.011A]   . Alzheimer's dementia with behavioral disturbance [G30.8, F02.81] 03/31/2016  . DM (diabetes mellitus) type II controlled, neurological manifestation ([place]) [E11.49] 10/30/2015  . Depression [F32.9] 10/30/2015  . CKD (chronic kidney disease) [N18.9] 10/30/2015    Total Time spent with patient: 1 hour  Subjective:   Melissa Fischer is a 83 y.o. female patient admitted with Dementia and depression.  HPI:  Melissa Fischer is a 83 y.o. female, seen, chart reviewed for this face-to-face psychiatry consultation and evaluation of medication management for depression and also has a history of Alzheimer's dementia. Patient is a poor historian and patient caretaker from home is at bedside. Reportedly patient has been able to care for herself and needed additional help from caretaker. Patient has been hospitalized secondary to increasing nausea, generalized weakness and confusion. Reportedly patient has been living in a independent living and may benefit from assisted living placement. Patient suffered with upper respiratory tract infections like to and also required hydration. Patient medications Wellbutrin 75 mg twice daily has been stopped  secondary to hyponatremia. Review of basic metabolic panel indicated patient sodium of his 127. Patient may benefit from venlafaxine XL 75 mg daily and also will discuss with patient daughter regarding medication for dementia. Reportedly patient has been allergic to Aricept, Exelon and Namenda.  Medical history: Patient with medical history significant for Alzheimer's dementia, type 2 diabetes mellitus, COPD, and cardiac syncope status-post pacer placement with no recurrence, now presenting to the emergency department for evaluation of generalized weakness, confusion, and nausea. Patient is accompanied by her daughter who assists with the history. The patient had been admitted on 01/25/2017 with very similar symptoms, found to have influenza a, was treated with Tamiflu and given IV hydration, and reports returning to her usual state by time of discharge. Unfortunately, she has had a recurrence in weakness and confusion over the past couple days, now with nausea as well. There is been no fevers or chills and the patient has not coughing or dyspneic. There has been no recent fall or head trauma and no recent change in her medications. Patient has complained of nausea, but there has been no vomiting or diarrhea. She denies abdominal pain. Sodium was low at time of the recent admission, but improved with hydration prior to discharge. Patient denies change in vision or hearing, or focal numbness or weakness. She has not complained of headache per se, but her daughter has noted her to be holding her forehead in apparent discomfort intermittently over the past couple days.  ED Course: Upon arrival to the ED, patient is found to be afebrile, saturating adequately on room air, and with vital signs otherwise stable. EKG features a sinus rhythm with left anterior fascicular block and no significant change from prior. Noncontrast head CT is negative for acute intracranial abnormality. Chest x-ray is negative  for acute  cardiopulmonary disease. Chemistry panels notable for sodium of 123, down from 128 on 01/26/17. CBC is unremarkable and troponin is undetectable. UA was ordered and remains pending. Patient was started on a gentle infusion of normal saline in the ED. She remains hemodynamically stable and in no acute respiratory distress, but given her worsening confusion with nausea and acute on chronic hyponatremia, she'll be observed in the hospital for further evaluation and management of this.  Review of Systems:  All other systems reviewed and apart from HPI, are negative.  Past Psychiatric History: Patient has been suffering with Alzheimer's dementia and also depression but no previous acute psychiatric hospitalization. Patient has been treated by primary care physician.  Risk to Self: Is patient at risk for suicide?: No Risk to Others:   Prior Inpatient Therapy:   Prior Outpatient Therapy:    Past Medical History:  Past Medical History:  Diagnosis Date  . Alzheimer disease   . Bronchitis   . COPD (chronic obstructive pulmonary disease) ([place])   . Depression   . Diabetes ([place])   . Hypercholesteremia   . Memory loss   . Pacemaker    [employer] DOI 2011  . Peripheral neuropathy ([place])   . Renal disease   . Shoulder pain, left   . Syncope     Past Surgical History:  Procedure Laterality Date  . HIP PINNING,CANNULATED Right 04/26/2016   Procedure: CANNULATED HIP PINNING;  Surgeon: Tania Ade, MD;  Location: [facility] ORS;  Service: Orthopedics;  Laterality: Right;  . no surgical history    . PACEMAKER PLACEMENT     Family History:  Family History  Problem Relation Age of Onset  . Diabetes Mother   . Dementia Neg Hx    Family Psychiatric  History: Noncontributory Social History:  History  Alcohol Use  . 0.0 oz/week    Comment: 1 drinks per day (10/29/15)     History  Drug Use No    Social History   Social History  . Marital status: Widowed    Spouse name: N/A  . Number of  children: 2  . Years of education: 65   Social History Main Topics  . Smoking status: Former Smoker    Quit date: 12/29/1974  . Smokeless tobacco: Never Used  . Alcohol use 0.0 oz/week     Comment: 1 drinks per day (10/29/15)  . Drug use: No  . Sexual activity: Not Asked   Other Topics Concern  . None   Social History Narrative   Lives at home with daughter, [name]   Caffeine use: 1 drink per day   Additional Social History:    Allergies:   Allergies  Allergen Reactions  . Aricept [Donepezil] Other (See Comments)    Leg cramps  . Exelon [Rivastigmine] Other (See Comments)    dizziness and sad  . Namenda [Memantine] Other (See Comments)    dizziness  . Penicillins     "pt does not know its been so long" Has patient had a PCN reaction causing immediate rash, facial/tongue/throat swelling, SOB or lightheadedness with hypotension: Unknown Has patient had a PCN reaction causing severe rash involving mucus membranes or skin necrosis: Unknown Has patient had a PCN reaction that required hospitalization: Unknown Has patient had a PCN reaction occurring within the last 10 years: Unknown If all of the above answers are "NO", then may proceed with Cephalosporin use.   . Statins Other (See Comments)    unknown  . Sulfa Antibiotics Other (See  Comments)    Unknown    Labs:  Results for orders placed or performed during the hospital encounter of 01/31/17 (from the past 48 hour(s))  CBC     Status: Abnormal   Collection Time: 02/03/17  3:00 PM  Result Value Ref Range   WBC 10.4 4.0 - 10.5 K/uL   RBC 4.15 3.87 - 5.11 MIL/uL   Hemoglobin 12.3 12.0 - 15.0 g/dL   HCT 34.2 (L) 36.0 - 46.0 %   MCV 82.4 78.0 - 100.0 fL   MCH 29.6 26.0 - 34.0 pg   MCHC 36.0 30.0 - 36.0 g/dL   RDW 12.3 11.5 - 15.5 %   Platelets 263 150 - 400 K/uL  Glucose, capillary     Status: Abnormal   Collection Time: 02/03/17  3:55 PM  Result Value Ref Range   Glucose-Capillary 151 (H) 65 - 99 mg/dL   Comment  1 Notify RN    Comment 2 Document in Chart   Sodium     Status: Abnormal   Collection Time: 02/03/17  4:50 PM  Result Value Ref Range   Sodium 124 (L) 135 - 145 mmol/L  Sodium     Status: Abnormal   Collection Time: 02/03/17 10:10 PM  Result Value Ref Range   Sodium 125 (L) 135 - 145 mmol/L  Glucose, capillary     Status: Abnormal   Collection Time: 02/03/17 10:16 PM  Result Value Ref Range   Glucose-Capillary 185 (H) 65 - 99 mg/dL  Sodium     Status: Abnormal   Collection Time: 02/04/17  4:55 AM  Result Value Ref Range   Sodium 126 (L) 135 - 145 mmol/L  Glucose, capillary     Status: Abnormal   Collection Time: 02/04/17  7:52 AM  Result Value Ref Range   Glucose-Capillary 217 (H) 65 - 99 mg/dL   Comment 1 Notify RN    Comment 2 Document in Chart   Basic metabolic panel     Status: Abnormal   Collection Time: 02/04/17 11:30 AM  Result Value Ref Range   Sodium 126 (L) 135 - 145 mmol/L   Potassium 3.7 3.5 - 5.1 mmol/L   Chloride 94 (L) 101 - 111 mmol/L   CO2 23 22 - 32 mmol/L   Glucose, Bld 198 (H) 65 - 99 mg/dL   BUN 19 6 - 20 mg/dL   Creatinine, Ser 0.80 0.44 - 1.00 mg/dL   Calcium 8.5 (L) 8.9 - 10.3 mg/dL   GFR calc non Af Amer >60 >60 mL/min   GFR calc Af Amer >60 >60 mL/min    Comment: (NOTE) The eGFR has been calculated using the CKD EPI equation. This calculation has not been validated in all clinical situations. eGFR's persistently <60 mL/min signify possible Chronic Kidney Disease.    Anion gap 9 5 - 15  Glucose, capillary     Status: Abnormal   Collection Time: 02/04/17 11:55 AM  Result Value Ref Range   Glucose-Capillary 203 (H) 65 - 99 mg/dL   Comment 1 Notify RN    Comment 2 Document in Chart   Glucose, capillary     Status: Abnormal   Collection Time: 02/04/17  5:18 PM  Result Value Ref Range   Glucose-Capillary 176 (H) 65 - 99 mg/dL  Basic metabolic panel     Status: Abnormal   Collection Time: 02/04/17  6:39 PM  Result Value Ref Range   Sodium  125 (L) 135 - 145 mmol/L   Potassium 3.5  3.5 - 5.1 mmol/L   Chloride 92 (L) 101 - 111 mmol/L   CO2 24 22 - 32 mmol/L   Glucose, Bld 282 (H) 65 - 99 mg/dL   BUN 15 6 - 20 mg/dL   Creatinine, Ser 0.76 0.44 - 1.00 mg/dL   Calcium 8.7 (L) 8.9 - 10.3 mg/dL   GFR calc non Af Amer >60 >60 mL/min   GFR calc Af Amer >60 >60 mL/min    Comment: (NOTE) The eGFR has been calculated using the CKD EPI equation. This calculation has not been validated in all clinical situations. eGFR's persistently <60 mL/min signify possible Chronic Kidney Disease.    Anion gap 9 5 - 15  Glucose, capillary     Status: Abnormal   Collection Time: 02/04/17  8:20 PM  Result Value Ref Range   Glucose-Capillary 255 (H) 65 - 99 mg/dL  Basic metabolic panel     Status: Abnormal   Collection Time: 02/04/17 11:04 PM  Result Value Ref Range   Sodium 127 (L) 135 - 145 mmol/L   Potassium 3.6 3.5 - 5.1 mmol/L   Chloride 94 (L) 101 - 111 mmol/L   CO2 25 22 - 32 mmol/L   Glucose, Bld 180 (H) 65 - 99 mg/dL   BUN 14 6 - 20 mg/dL   Creatinine, Ser 0.69 0.44 - 1.00 mg/dL   Calcium 8.6 (L) 8.9 - 10.3 mg/dL   GFR calc non Af Amer >60 >60 mL/min   GFR calc Af Amer >60 >60 mL/min    Comment: (NOTE) The eGFR has been calculated using the CKD EPI equation. This calculation has not been validated in all clinical situations. eGFR's persistently <60 mL/min signify possible Chronic Kidney Disease.    Anion gap 8 5 - 15  Basic metabolic panel     Status: Abnormal   Collection Time: 02/05/17  5:33 AM  Result Value Ref Range   Sodium 127 (L) 135 - 145 mmol/L   Potassium 3.7 3.5 - 5.1 mmol/L   Chloride 94 (L) 101 - 111 mmol/L   CO2 22 22 - 32 mmol/L   Glucose, Bld 212 (H) 65 - 99 mg/dL   BUN 14 6 - 20 mg/dL   Creatinine, Ser 0.65 0.44 - 1.00 mg/dL   Calcium 8.6 (L) 8.9 - 10.3 mg/dL   GFR calc non Af Amer >60 >60 mL/min   GFR calc Af Amer >60 >60 mL/min    Comment: (NOTE) The eGFR has been calculated using the CKD EPI  equation. This calculation has not been validated in all clinical situations. eGFR's persistently <60 mL/min signify possible Chronic Kidney Disease.    Anion gap 11 5 - 15  Glucose, capillary     Status: Abnormal   Collection Time: 02/05/17  7:36 AM  Result Value Ref Range   Glucose-Capillary 198 (H) 65 - 99 mg/dL  Glucose, capillary     Status: Abnormal   Collection Time: 02/05/17 11:34 AM  Result Value Ref Range   Glucose-Capillary 260 (H) 65 - 99 mg/dL    Current Facility-Administered Medications  Medication Dose Route Frequency Provider Last Rate Last Dose  . acetaminophen (TYLENOL) tablet 650 mg  650 mg Oral Q6H PRN Vianne Bulls, MD   650 mg at 02/05/17 0451   Or  . acetaminophen (TYLENOL) suppository 650 mg  650 mg Rectal Q6H PRN Vianne Bulls, MD      . amLODipine (NORVASC) tablet 10 mg  10 mg Oral Daily Janece Canterbury, MD  10 mg at 02/05/17 1032  . calcium-vitamin D (OSCAL WITH D) 500-200 MG-UNIT per tablet 1 tablet  1 tablet Oral Daily Vianne Bulls, MD   1 tablet at 02/05/17 1033  . chlorhexidine (PERIDEX) 0.12 % solution 15 mL  15 mL Mouth Rinse BID Belkys A Regalado, MD   15 mL at 02/05/17 1033  . Chlorhexidine Gluconate Cloth 2 % PADS 6 each  6 each Topical Q0600 Belkys A Regalado, MD      . dextromethorphan (DELSYM) 30 MG/5ML liquid 60 mg  60 mg Oral Q12H PRN Vianne Bulls, MD   60 mg at 02/02/17 1825  . enoxaparin (LOVENOX) injection 40 mg  40 mg Subcutaneous QHS Roney Jaffe, MD   40 mg at 02/04/17 2226  . feeding supplement (GLUCERNA SHAKE) (GLUCERNA SHAKE) liquid 237 mL  237 mL Oral BID BM Janece Canterbury, MD   237 mL at 02/04/17 1134  . furosemide (LASIX) tablet 20 mg  20 mg Oral Daily Roney Jaffe, MD   20 mg at 02/05/17 1033  . hydrALAZINE (APRESOLINE) injection 5 mg  5 mg Intravenous Q6H PRN Belkys A Regalado, MD   5 mg at 02/04/17 0840  . hydrALAZINE (APRESOLINE) tablet 25 mg  25 mg Oral Q8H Roney Jaffe, MD   25 mg at 02/05/17 0451  . insulin  aspart (novoLOG) injection 0-15 Units  0-15 Units Subcutaneous TID WC Vianne Bulls, MD   3 Units at 02/05/17 0754  . insulin aspart (novoLOG) injection 0-5 Units  0-5 Units Subcutaneous QHS Vianne Bulls, MD   3 Units at 02/04/17 2226  . loratadine (CLARITIN) tablet 10 mg  10 mg Oral Daily Vianne Bulls, MD   10 mg at 02/05/17 1032  . MEDLINE mouth rinse  15 mL Mouth Rinse q12n4p Belkys A Regalado, MD   15 mL at 02/04/17 1842  . mometasone-formoterol (DULERA) 200-5 MCG/ACT inhaler 2 puff  2 puff Inhalation BID Vianne Bulls, MD   2 puff at 02/05/17 0820  . multivitamin with minerals tablet 1 tablet  1 tablet Oral Daily Vianne Bulls, MD   1 tablet at 02/05/17 1032  . ondansetron (ZOFRAN) tablet 4 mg  4 mg Oral Q6H PRN Vianne Bulls, MD       Or  . ondansetron (ZOFRAN) injection 4 mg  4 mg Intravenous Q6H PRN Ilene Qua Opyd, MD      . polyethylene glycol (MIRALAX / GLYCOLAX) packet 17 g  17 g Oral Daily PRN Ilene Qua Opyd, MD      . sodium chloride flush (NS) 0.9 % injection 10-40 mL  10-40 mL Intracatheter Q12H Belkys A Regalado, MD   10 mL at 02/04/17 1133  . sodium chloride flush (NS) 0.9 % injection 10-40 mL  10-40 mL Intracatheter PRN Belkys A Regalado, MD   10 mL at 02/05/17 0539  . sodium chloride tablet 2 g  2 g Oral TID WC Roney Jaffe, MD   2 g at 02/05/17 2774    Musculoskeletal: Strength & Muscle Tone: decreased Gait & Station: unable to stand Patient leans: N/A  Psychiatric Specialty Exam: Physical Exam as per history and physical   ROS generalized weakness, nausea and forgetfulness. No Fever-chills, No Headache, No changes with Vision or hearing, reports vertigo No problems swallowing food or Liquids, No Chest pain, Cough or Shortness of Breath, No Abdominal pain, No Nausea or Vommitting, Bowel movements are regular, No Blood in stool or Urine, No dysuria, No new  skin rashes or bruises, No new joints pains-aches,  No new weakness, tingling, numbness in any  extremity, No recent weight gain or loss, No polyuria, polydypsia or polyphagia,  A full 10 point Review of Systems was done, except as stated above, all other Review of Systems were negative.   Blood pressure (!) 148/70, pulse (!) 108, temperature 98.7 F (37.1 C), temperature source Oral, resp. rate 20, height 5' 8.5" (1.74 m), weight 72.2 kg (159 lb 2.8 oz), SpO2 95 %.Body mass index is 23.85 kg/m.  General Appearance: Casual  Eye Contact:  Good  Speech:  Blocked, Clear and Coherent and Slow  Volume:  Decreased  Mood:  Anxious and Depressed  Affect:  Constricted and Depressed  Thought Process:  Coherent  Orientation:  Full (Time, Place, and Person)  Thought Content:  Logical  Suicidal Thoughts:  No  Homicidal Thoughts:  No  Memory:  Immediate;   Fair Recent;   Poor Remote;   Poor  Judgement:  Fair  Insight:  Shallow  Psychomotor Activity:  Decreased  Concentration:  Concentration: Poor and Attention Span: Poor  Recall:  Poor  Fund of Knowledge:  Poor  Language:  Fair  Akathisia:  Negative  Handed:  Right  AIMS (if indicated):     Assets:  Catering manager Housing Leisure Time Social Support Transportation  ADL's:  Intact  Cognition:  Impaired,  Moderate  Sleep:        Treatment Plan Summary: 83 years old female with Alzheimer's dementia, depression, hyponatremia secondary to flu symptoms presented with generalized weakness, nausea and increased forgetfulness. Patient Wellbutrin has been discontinued to correct her hyponatremia.  Agree with the Wellbutrin discontinuation secondary to treatment need of hyponatremia We start venlafaxine75 mg daily for depression Case discussed with the units [name] who will contact patient daughter for collateral information Appreciate psychiatric consultation and we sign off as of today Please contact [phone] or [phone] if needs further assistance   Disposition: No evidence of imminent risk to self or others at  present.   Supportive therapy provided about ongoing stressors.  Ambrose Finland, MD 02/05/2017 12:15 PM

## 2017-02-05 NOTE — Progress Notes (Signed)
Inpatient Diabetes Program Recommendations  AACE/ADA: New Consensus Statement on Inpatient Glycemic Control (2015)  Target Ranges:  Prepandial:   less than 140 mg/dL      Peak postprandial:   less than 180 mg/dL (1-2 hours)      Critically ill patients:  140 - 180 mg/dL   Results for Melissa Fischer, Melissa Fischer (MRN [id number]) as of 02/05/2017 10:56  Ref. Range 02/04/2017 07:52 02/04/2017 11:55 02/04/2017 17:18 02/04/2017 20:20  Glucose-Capillary Latest Ref Range: 65 - 99 mg/dL 217 (H) 203 (H) 176 (H) 255 (H)   Results for Melissa Fischer, Melissa Fischer (MRN [id number]) as of 02/05/2017 10:56  Ref. Range 02/05/2017 07:36  Glucose-Capillary Latest Ref Range: 65 - 99 mg/dL 198 (H)    Home DM Meds: Metformin 500 mg daily  Current Insulin Orders: Novolog Moderate Correction Scale/ SSI (0-15 units) TID AC + HS      MD- Note patient having elevated glucose levels in hospital.  Getting Regular PO diet with NO carbohydrate restrictions and also getting Glucerna supplements.  May want to change PO diet orders to Carbohydrate Modified diet  May also consider starting low dose basal insulin for patient in-hospital: Lantus 7 units daily (0.1 units/kg based on weight of 72 kg)    --Will follow patient during hospitalization--  Wyn Quaker RN, MSN, CDE Diabetes Coordinator Inpatient Glycemic Control Team Team Pager: [phone] (8a-5p)

## 2017-02-05 NOTE — Progress Notes (Signed)
Initial Nutrition Assessment  DOCUMENTATION CODES:   Not applicable  INTERVENTION:   Glucerna Shake po TID, each supplement provides 220 kcal and 10 grams of protein  NUTRITION DIAGNOSIS:   Inadequate oral intake related to lethargy/confusion, acute illness as evidenced by meal completion < 50%.  GOAL:   Patient will meet greater than or equal to 90% of their needs  MONITOR:   PO intake, Supplement acceptance, Weight trends, Labs  REASON FOR ASSESSMENT:   Consult Assessment of nutrition requirement/status  ASSESSMENT:   83 y.o. female with medical history significant for Alzheimer's dementia, type 2 diabetes mellitus, COPD, and cardiac syncope status-post pacer placement with no recurrence, now presenting to the emergency department for evaluation of generalized weakness, confusion, and nausea. Patient is accompanied by her daughter who assists with the history. The patient had been admitted on 01/25/2017 with very similar symptoms, found to have influenza a, was treated with Tamiflu and given IV hydration, and reports returning to her usual state by time of discharge. Unfortunately, she had a recurrence in weakness and confusion and was readmitted with hyponatremia and encephalopathy. 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 Pt moderately confused so information was obtained from caregiver who reports that pt has a great appetite at home and always eats well. Caregiver reports that pt is definitely not feeling herself today, because she has been sleeping a lot and pt is usually very outgoing and always ready to go. Pt is eating about 50% of her meals currently. Pt did drink 2 Glucerna yesterday. Per chart, pt is weight stable. Pt with SIADH and intermittent hyponatremia. Pt on 1060m fluid restriction and getting NaCl tabs. RD talked with caregiver about encouraging pt to drink her Glucerna.   Medications reviewed and include: Ca-Vit  D, lovenox, lasix, insulin, MVI, NaCl  Labs reviewed: Na 127(L), Cl 94(L), Ca 8.6(L) cbgs- 198, 282, 180, 212 x 24hrs AIC- 7.9(H) 1/29  Nutrition-Focused physical exam completed. Findings are no fat depletion, no muscle depletion, and no edema.   Diet Order:  Diet regular Room service appropriate? Yes; Fluid consistency: Thin  Skin:  Reviewed, no issues  Last BM:  2/5  Height:   Ht Readings from Last 1 Encounters:  02/01/17 5' 8.5" (1.74 m)    Weight:   Wt Readings from Last 1 Encounters:  02/04/17 159 lb 2.8 oz (72.2 kg)    Ideal Body Weight:  64.8 kg  BMI:  Body mass index is 23.85 kg/m.  Estimated Nutritional Needs:   Kcal:  1600-1900kcal/day   Protein:  70-84g/day   Fluid:  >1.6L/day   EDUCATION NEEDS:   No education needs identified at this time  CKoleen Distance RD, LDN Pager #-[phone]

## 2017-02-05 NOTE — Progress Notes (Signed)
Physical Therapy Treatment Patient Details Name: Esa Cadd MRN: [id number] DOB: [birth year] Today's Date: 02/05/2017    History of Present Illness 83 y.o. female admitted with acute encephalopathy, hyponatremia,  confusion. Hx of Alzheimer's dementia, COPD, T2DM, PPM, R hip pinning 2017    PT Comments    Pt required Max assist +2 for mobility on today. Max encouragement required for participation. Pt remains weak and continues to have difficulty following 1 step commands. Continue to recommend SNF.   Follow Up Recommendations  SNF     Equipment Recommendations  None recommended by PT    Recommendations for Other Services OT consult     Precautions / Restrictions Precautions Precautions: Fall Precaution Comments: Hx of rotator cuff injury-limited use of R UE Restrictions Weight Bearing Restrictions: No    Mobility  Bed Mobility Overal bed mobility: Needs Assistance Bed Mobility: Supine to Sit     Supine to sit: Max assist;+2 for physical assistance;+2 for safety/equipment;HOB elevated     General bed mobility comments: Assist for trunk and bil LEs. Utilized bedpad for scooting, positioning. Pt did not initiate any of task so required increased assistance for participation.   Transfers Overall transfer level: Needs assistance Equipment used: Rolling walker (2 wheeled) Transfers: Sit to/from Stand Sit to Stand: Max assist;+2 physical assistance;+2 safety/equipment;From elevated surface         General transfer comment: x 2. Once with RW-heavy leaning to R side despite cueing. Once with STEDY-moderate leaning to R side. Both instances, pt required assistance to place hands on walker handles/bar on STEDY  Ambulation/Gait             General Gait Details: NT-pt unable   Stairs            Wheelchair Mobility    Modified Rankin (Stroke Patients Only)       Balance Overall balance assessment: Needs assistance Sitting-balance support: Feet  supported Sitting balance-Leahy Scale: Poor Sitting balance - Comments: Intially, pt sitting with Min guard assist with feet unsupported. As session progressed, noted increased R side lean. Pt was unable to self correct.  Postural control: Right lateral lean   Standing balance-Leahy Scale: Zero                      Cognition Arousal/Alertness: Awake/alert Behavior During Therapy: Anxious Overall Cognitive Status: Impaired/Different from baseline Area of Impairment: Problem solving;Following commands       Following Commands: Follows one step commands inconsistently     Problem Solving: Requires tactile cues;Requires verbal cues;Difficulty sequencing;Decreased initiation;Slow processing General Comments: Pt has a history of Alz however cognition is more impaired than baseline    Exercises      General Comments        Pertinent Vitals/Pain Pain Assessment: Faces Faces Pain Scale: Hurts even more Pain Location: neck mostly but also generalized pain all over Pain Descriptors / Indicators: Sore;Aching Pain Intervention(s): Limited activity within patient's tolerance;Repositioned    Home Living                      Prior Function            PT Goals (current goals can now be found in the care plan section) Progress towards PT goals: Progressing toward goals    Frequency    Min 3X/week      PT Plan Current plan remains appropriate    Co-evaluation  End of Session Equipment Utilized During Treatment: Gait belt Activity Tolerance: Patient limited by fatigue;Patient limited by pain Patient left: in chair;with call bell/phone within reach;with family/visitor present;with chair alarm set     Time: [id number] PT Time Calculation (min) (ACUTE ONLY): 23 min  Charges:  $Therapeutic Activity: 23-37 mins                    G Codes:      Weston Anna, MPT Pager: [phone]

## 2017-02-05 NOTE — Progress Notes (Signed)
PROGRESS NOTE  Melissa Fischer  [id number] DOB: [birth year] DOA: 01/31/2017 PCP: Mathews Argyle, MD  Brief Narrative:   Melissa Fischer a 83 y.o.femalewith medical history significant forAlzheimer's dementia, type 2 diabetes mellitus, COPD, and cardiac syncope status-post pacer placement with no recurrence, now presenting to the emergency department for evaluation of generalized weakness, confusion, and nausea. Patient is accompanied by her daughter who assists with the history. The patient had been admitted on 01/25/2017 with very similar symptoms, found to have influenza a, was treated with Tamiflu and given IV hydration, and reports returning to her usual state by time of discharge. Unfortunately, she had a recurrence in weakness and confusion and was readmitted with hyponatremia and encephalopathy. 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 Wellbutrin has been stopped.  She was given hypertonic saline which corrected her sodium to 130 but her hyponatremia recurred after cessation of fluids.  She was started on lasix and sodium chloride tabs by nephrology and her sodium is gradually trending up.    Assessment & Plan:   Principal Problem:   Acute encephalopathy Active Problems:   Depression   Alzheimer's dementia with behavioral disturbance   Type 2 diabetes mellitus with hyperglycemia (HCC)   COPD (chronic obstructive pulmonary disease) (HCC)   Hyponatremia   Encephalopathy acute  Acute encephalopathy; Metabolic Probably related to recent tamiflu and hyponatremia with underlying dementia.  -improving confusion since yesterday -CT head negative. No evidence of infection: chest x ray negative, UA negative.  -Ammonia 20. TSH normal,  -Sodium gradually trending up  Hyponatremia due to SIADH, 123 on admission - Initially sodium increase to 125 with IV fluids but then decreased to 120.  - She was started on 3% hypertonic through picc line. - Appreciate Nephrology, Dr Jonnie Finner  assistance - Wellbutrin has been stopped - cortisol and TSH normal.  - Continue lasix, water restriction and sodium tablets -  Repeat BMP in AM and then in 5-7 days by outpatient MD  Alzheimer with depression and anxiety, at baseline patient is able to recognize daughter and is oriented to person.  - oriented to person today but not place - has been tearful and expressed hopelessness to staff and daughter - had to stop wellbutrin due to hyponatremia - Psych consult:  Is there anything we could try for depression that would not contribute to hyponatremia?  HTN; started Norvasc 2-05.  Diabetes mellitus, A1c 7.9 -  On regular diet -  Change ensure to glucerna -  Continue moderate dose SSI with HS   Poor oral intake -  Regular diet with supplements -  Nutrition consultation  DVT prophylaxis:  lovenox Code Status:  Full code Family Communication:  Patient and her daughter who was present at bedside Disposition Plan:  To SNF for ST rehab once lasix and salt tab doses stable   Consultants:   Nephrology  Procedures:  PICC line placement  Antimicrobials:  Anti-infectives    None       Subjective: Confused.  Demented.  Has headache and left neck pain.  Denies SOB, nausea, abdominal pain.  Coughing some  Objective: Vitals:   02/04/17 1943 02/04/17 2018 02/05/17 0522 02/05/17 0820  BP:  138/67 (!) 148/70   Pulse:  (!) 110 (!) 108   Resp:  18 20   Temp:  99.5 F (37.5 C) 98.7 F (37.1 C)   TempSrc:  Oral Oral   SpO2: 96% 98% 95% 95%  Weight:      Height:  Intake/Output Summary (Last 24 hours) at 02/05/17 1354 Last data filed at 02/05/17 1326  Gross per 24 hour  Intake              590 ml  Output                0 ml  Net              590 ml   Filed Weights   02/02/17 0415 02/03/17 0421 02/04/17 0329  Weight: 75 kg (165 lb 5.5 oz) 72.7 kg (160 lb 4.4 oz) 72.2 kg (159 lb 2.8 oz)    Examination:  General exam:  Adult female.  No acute distress.     HEENT:  NCAT, MMM Respiratory system: Clear to auscultation bilaterally Cardiovascular system: Regular rate and rhythm, normal 99991111. 2/6 systolic murmur, rubs, gallops or clicks.  Warm extremities Gastrointestinal system: Normal active bowel sounds, soft, nondistended, nontender. MSK:  Normal tone and bulk, no lower extremity edema Neuro:  Grossly moves all extremities    Data Reviewed: I have personally reviewed following labs and imaging studies  CBC:  Recent Labs Lab 01/31/17 2010 02/03/17 1500  WBC 9.5 10.4  HGB 12.3 12.3  HCT 33.8* 34.2*  MCV 82.2 82.4  PLT 246 99991111   Basic Metabolic Panel:  Recent Labs Lab 02/03/17 0825  02/04/17 0455 02/04/17 1130 02/04/17 1839 02/04/17 2304 02/05/17 0533  NA 123*  < > 126* 126* 125* 127* 127*  K 3.8  --   --  3.7 3.5 3.6 3.7  CL 90*  --   --  94* 92* 94* 94*  CO2 25  --   --  23 24 25 22   GLUCOSE 170*  --   --  198* 282* 180* 212*  BUN 13  --   --  19 15 14 14   CREATININE 0.70  --   --  0.80 0.76 0.69 0.65  CALCIUM 8.7*  --   --  8.5* 8.7* 8.6* 8.6*  < > = values in this interval not displayed. GFR: Estimated Creatinine Clearance: 57.6 mL/min (by C-G formula based on SCr of 0.65 mg/dL). Liver Function Tests:  Recent Labs Lab 01/31/17 2010  AST 37  ALT 24  ALKPHOS 55  BILITOT 0.8  PROT 7.3  ALBUMIN 4.2   No results for input(s): LIPASE, AMYLASE in the last 168 hours.  Recent Labs Lab 02/01/17 0041  AMMONIA 20   Coagulation Profile: No results for input(s): INR, PROTIME in the last 168 hours. Cardiac Enzymes: No results for input(s): CKTOTAL, CKMB, CKMBINDEX, TROPONINI in the last 168 hours. BNP (last 3 results) No results for input(s): PROBNP in the last 8760 hours. HbA1C: No results for input(s): HGBA1C in the last 72 hours. CBG:  Recent Labs Lab 02/04/17 1155 02/04/17 1718 02/04/17 2020 02/05/17 0736 02/05/17 1134  GLUCAP 203* 176* 255* 198* 260*   Lipid Profile: No results for input(s):  CHOL, HDL, LDLCALC, TRIG, CHOLHDL, LDLDIRECT in the last 72 hours. Thyroid Function Tests: No results for input(s): TSH, T4TOTAL, FREET4, T3FREE, THYROIDAB in the last 72 hours. Anemia Panel: No results for input(s): VITAMINB12, FOLATE, FERRITIN, TIBC, IRON, RETICCTPCT in the last 72 hours. Urine analysis:    Component Value Date/Time   COLORURINE YELLOW 02/01/2017 0034   APPEARANCEUR CLEAR 02/01/2017 0034   LABSPEC 1.014 02/01/2017 0034   PHURINE 7.0 02/01/2017 0034   GLUCOSEU >=500 (A) 02/01/2017 0034   HGBUR NEGATIVE 02/01/2017 0034   BILIRUBINUR NEGATIVE 02/01/2017 0034  KETONESUR NEGATIVE 02/01/2017 0034   PROTEINUR NEGATIVE 02/01/2017 0034   NITRITE NEGATIVE 02/01/2017 0034   LEUKOCYTESUR NEGATIVE 02/01/2017 0034   Sepsis Labs: @LABRCNTIP (procalcitonin:4,lacticidven:4)  ) Recent Results (from the past 240 hour(s))  Urine culture     Status: Abnormal   Collection Time: 02/01/17 12:33 AM  Result Value Ref Range Status   Specimen Description URINE, CATHETERIZED  Final   Special Requests NONE  Final   Culture (A)  Final    <10,000 COLONIES/mL INSIGNIFICANT GROWTH Performed at [facility], [address], [place], [place] [address]    Report Status 02/02/2017 FINAL  Final  MRSA PCR Screening     Status: None   Collection Time: 02/02/17  2:00 PM  Result Value Ref Range Status   MRSA by PCR NEGATIVE NEGATIVE Final    Comment:        The GeneXpert MRSA Assay (FDA approved for NASAL specimens only), is one component of a comprehensive MRSA colonization surveillance program. It is not intended to diagnose MRSA infection nor to guide or monitor treatment for MRSA infections.       Radiology Studies: No results found.   Scheduled Meds: . amLODipine  10 mg Oral Daily  . calcium-vitamin D  1 tablet Oral Daily  . chlorhexidine  15 mL Mouth Rinse BID  . Chlorhexidine Gluconate Cloth  6 each Topical Q0600  . enoxaparin (LOVENOX) injection  40 mg Subcutaneous  QHS  . feeding supplement (GLUCERNA SHAKE)  237 mL Oral BID BM  . furosemide  20 mg Oral Daily  . hydrALAZINE  25 mg Oral Q8H  . insulin aspart  0-15 Units Subcutaneous TID WC  . insulin aspart  0-5 Units Subcutaneous QHS  . loratadine  10 mg Oral Daily  . mouth rinse  15 mL Mouth Rinse q12n4p  . mometasone-formoterol  2 puff Inhalation BID  . multivitamin with minerals  1 tablet Oral Daily  . sodium chloride flush  10-40 mL Intracatheter Q12H  . sodium chloride  2 g Oral TID WC   Continuous Infusions:   LOS: 4 days    Time spent: 30 min    Janece Canterbury, MD [facility] Pager [phone]  If 7PM-7AM, please contact night-coverage [email or web address] Password [email or web address] 02/05/2017, 1:54 PM

## 2017-02-05 NOTE — NC FL2 (Signed)
[place] MEDICAID FL2 LEVEL OF CARE SCREENING TOOL     IDENTIFICATION  Patient Name: Melissa Fischer Birthdate: [birth year] Sex: female Admission Date (Current Location): 01/31/2017  [facility] and [place] Number:  Herbalist and Address:  [facility],  [place], [place]      Provider Number: [id number]  Attending Physician Name and Address:  Janece Canterbury, MD  Relative Name and Phone Number:       Current Level of Care: Hospital Recommended Level of Care: [place] Prior Approval Number:    Date Approved/Denied:   PASRR Number:  [id number]   Discharge Plan: SNF    Current Diagnoses: Patient Active Problem List   Diagnosis Date Noted  . Encephalopathy acute 02/01/2017  . Hyponatremia 01/31/2017  . Acute encephalopathy 01/31/2017  . Generalized weakness   . Influenza A 01/25/2017  . Hip fracture ([place]) 04/26/2016  . Type 2 diabetes mellitus with hyperglycemia ([place]) 04/26/2016  . COPD (chronic obstructive pulmonary disease) ([place]) 04/26/2016  . Closed right hip fracture ([place]) 04/26/2016  . Subcapital fracture of right femur ([place])   . Alzheimer's dementia with behavioral disturbance 03/31/2016  . DM (diabetes mellitus) type II controlled, neurological manifestation ([place]) 10/30/2015  . Depression 10/30/2015  . CKD (chronic kidney disease) 10/30/2015    Orientation RESPIRATION BLADDER Height & Weight     Self  Normal Incontinent Weight: 159 lb 2.8 oz (72.2 kg) Height:  5' 8.5" (174 cm)  BEHAVIORAL SYMPTOMS/MOOD NEUROLOGICAL BOWEL NUTRITION STATUS      Incontinent Diet (Regular)  AMBULATORY STATUS COMMUNICATION OF NEEDS Skin   Extensive Assist Verbally Normal                       Personal Care Assistance Level of Assistance  Bathing, Feeding, Dressing Bathing Assistance: Limited assistance Feeding assistance: Limited assistance Dressing Assistance: Limited assistance     Functional Limitations Info   Sight, Hearing, Speech Sight Info: Adequate Hearing Info: Adequate Speech Info: Adequate    SPECIAL CARE FACTORS FREQUENCY  PT (By licensed PT), OT (By licensed OT)     PT Frequency: 5 OT Frequency: 5            Contractures Contractures Info: Not present    Additional Factors Info  Code Status, Allergies Code Status Info: Fullcode Allergies Info: Aricept Donepezil, Exelon Rivastigmine, Namenda Memantine, Penicillins, Statins, Sulfa Antibiotics           Current Medications (02/05/2017):  This is the current hospital active medication list Current Facility-Administered Medications  Medication Dose Route Frequency Provider Last Rate Last Dose  . acetaminophen (TYLENOL) tablet 650 mg  650 mg Oral Q6H PRN Vianne Bulls, MD   650 mg at 02/05/17 0451   Or  . acetaminophen (TYLENOL) suppository 650 mg  650 mg Rectal Q6H PRN Vianne Bulls, MD      . amLODipine (NORVASC) tablet 10 mg  10 mg Oral Daily Janece Canterbury, MD   10 mg at 02/05/17 1032  . calcium-vitamin D (OSCAL WITH D) 500-200 MG-UNIT per tablet 1 tablet  1 tablet Oral Daily Vianne Bulls, MD   1 tablet at 02/05/17 1033  . chlorhexidine (PERIDEX) 0.12 % solution 15 mL  15 mL Mouth Rinse BID Belkys A Regalado, MD   15 mL at 02/05/17 1033  . Chlorhexidine Gluconate Cloth 2 % PADS 6 each  6 each Topical Q0600 Belkys A Regalado, MD      . dextromethorphan (  DELSYM) 30 MG/5ML liquid 60 mg  60 mg Oral Q12H PRN Vianne Bulls, MD   60 mg at 02/02/17 1825  . enoxaparin (LOVENOX) injection 40 mg  40 mg Subcutaneous QHS Roney Jaffe, MD   40 mg at 02/04/17 2226  . feeding supplement (GLUCERNA SHAKE) (GLUCERNA SHAKE) liquid 237 mL  237 mL Oral BID BM Janece Canterbury, MD   237 mL at 02/04/17 1134  . furosemide (LASIX) tablet 20 mg  20 mg Oral Daily Roney Jaffe, MD   20 mg at 02/05/17 1033  . hydrALAZINE (APRESOLINE) injection 5 mg  5 mg Intravenous Q6H PRN Belkys A Regalado, MD   5 mg at 02/04/17 0840  . hydrALAZINE  (APRESOLINE) tablet 25 mg  25 mg Oral Q8H Roney Jaffe, MD   25 mg at 02/05/17 0451  . insulin aspart (novoLOG) injection 0-15 Units  0-15 Units Subcutaneous TID WC Vianne Bulls, MD   8 Units at 02/05/17 1226  . insulin aspart (novoLOG) injection 0-5 Units  0-5 Units Subcutaneous QHS Vianne Bulls, MD   3 Units at 02/04/17 2226  . loratadine (CLARITIN) tablet 10 mg  10 mg Oral Daily Vianne Bulls, MD   10 mg at 02/05/17 1032  . MEDLINE mouth rinse  15 mL Mouth Rinse q12n4p Belkys A Regalado, MD   15 mL at 02/05/17 1226  . mometasone-formoterol (DULERA) 200-5 MCG/ACT inhaler 2 puff  2 puff Inhalation BID Vianne Bulls, MD   2 puff at 02/05/17 0820  . multivitamin with minerals tablet 1 tablet  1 tablet Oral Daily Vianne Bulls, MD   1 tablet at 02/05/17 1032  . ondansetron (ZOFRAN) tablet 4 mg  4 mg Oral Q6H PRN Vianne Bulls, MD       Or  . ondansetron (ZOFRAN) injection 4 mg  4 mg Intravenous Q6H PRN Ilene Qua Opyd, MD      . polyethylene glycol (MIRALAX / GLYCOLAX) packet 17 g  17 g Oral Daily PRN Ilene Qua Opyd, MD      . sodium chloride flush (NS) 0.9 % injection 10-40 mL  10-40 mL Intracatheter Q12H Belkys A Regalado, MD   10 mL at 02/04/17 1133  . sodium chloride flush (NS) 0.9 % injection 10-40 mL  10-40 mL Intracatheter PRN Belkys A Regalado, MD   20 mL at 02/05/17 1326  . sodium chloride tablet 2 g  2 g Oral TID WC Roney Jaffe, MD   2 g at 02/05/17 1225     Discharge Medications: Please see discharge summary for a list of discharge medications.  Relevant Imaging Results:  Relevant Lab Results:   Additional Information SSN: [id number]  Lia Hopping, [name]

## 2017-02-06 DIAGNOSIS — F321 Major depressive disorder, single episode, moderate: Secondary | ICD-10-CM

## 2017-02-06 DIAGNOSIS — E119 Type 2 diabetes mellitus without complications: Secondary | ICD-10-CM | POA: Diagnosis not present

## 2017-02-06 DIAGNOSIS — R278 Other lack of coordination: Secondary | ICD-10-CM | POA: Diagnosis not present

## 2017-02-06 DIAGNOSIS — R509 Fever, unspecified: Secondary | ICD-10-CM | POA: Diagnosis not present

## 2017-02-06 DIAGNOSIS — G301 Alzheimer's disease with late onset: Secondary | ICD-10-CM

## 2017-02-06 DIAGNOSIS — R262 Difficulty in walking, not elsewhere classified: Secondary | ICD-10-CM | POA: Diagnosis not present

## 2017-02-06 DIAGNOSIS — J101 Influenza due to other identified influenza virus with other respiratory manifestations: Secondary | ICD-10-CM | POA: Diagnosis not present

## 2017-02-06 DIAGNOSIS — E222 Syndrome of inappropriate secretion of antidiuretic hormone: Secondary | ICD-10-CM | POA: Diagnosis not present

## 2017-02-06 DIAGNOSIS — R410 Disorientation, unspecified: Secondary | ICD-10-CM | POA: Diagnosis not present

## 2017-02-06 DIAGNOSIS — E1165 Type 2 diabetes mellitus with hyperglycemia: Secondary | ICD-10-CM | POA: Diagnosis not present

## 2017-02-06 DIAGNOSIS — J449 Chronic obstructive pulmonary disease, unspecified: Secondary | ICD-10-CM | POA: Diagnosis not present

## 2017-02-06 DIAGNOSIS — E871 Hypo-osmolality and hyponatremia: Secondary | ICD-10-CM | POA: Diagnosis not present

## 2017-02-06 DIAGNOSIS — R066 Hiccough: Secondary | ICD-10-CM | POA: Diagnosis not present

## 2017-02-06 DIAGNOSIS — F0390 Unspecified dementia without behavioral disturbance: Secondary | ICD-10-CM | POA: Diagnosis not present

## 2017-02-06 DIAGNOSIS — F329 Major depressive disorder, single episode, unspecified: Secondary | ICD-10-CM | POA: Diagnosis not present

## 2017-02-06 DIAGNOSIS — G309 Alzheimer's disease, unspecified: Secondary | ICD-10-CM | POA: Diagnosis not present

## 2017-02-06 DIAGNOSIS — M542 Cervicalgia: Secondary | ICD-10-CM | POA: Diagnosis not present

## 2017-02-06 DIAGNOSIS — G9340 Encephalopathy, unspecified: Secondary | ICD-10-CM | POA: Diagnosis not present

## 2017-02-06 DIAGNOSIS — F0281 Dementia in other diseases classified elsewhere with behavioral disturbance: Secondary | ICD-10-CM | POA: Diagnosis not present

## 2017-02-06 LAB — GLUCOSE, CAPILLARY
Glucose-Capillary: 212 mg/dL — ABNORMAL HIGH (ref 65–99)
Glucose-Capillary: 287 mg/dL — ABNORMAL HIGH (ref 65–99)

## 2017-02-06 LAB — BASIC METABOLIC PANEL
ANION GAP: 7 (ref 5–15)
BUN: 17 mg/dL (ref 6–20)
CALCIUM: 8.7 mg/dL — AB (ref 8.9–10.3)
CO2: 26 mmol/L (ref 22–32)
CREATININE: 0.68 mg/dL (ref 0.44–1.00)
Chloride: 98 mmol/L — ABNORMAL LOW (ref 101–111)
GFR calc Af Amer: >60 mL/min (ref >60)
GLUCOSE: 229 mg/dL — AB (ref 65–99)
Potassium: 3.3 mmol/L — ABNORMAL LOW (ref 3.5–5.1)
Sodium: 131 mmol/L — ABNORMAL LOW (ref 135–145)

## 2017-02-06 MED ORDER — FUROSEMIDE 20 MG PO TABS: 20.0000 mg | ORAL_TABLET | Freq: Every day | ORAL | 0 refills | Status: DC

## 2017-02-06 MED ORDER — LABETALOL HCL 100 MG PO TABS: 100.0000 mg | ORAL_TABLET | Freq: Three times a day (TID) | ORAL | 0 refills | Status: DC

## 2017-02-06 MED ORDER — POLYETHYLENE GLYCOL 3350 17 G PO PACK: 17.0000 g | PACK | Freq: Every day | ORAL | 0 refills | Status: DC | PRN

## 2017-02-06 MED ORDER — GLUCERNA SHAKE PO LIQD: 237.0000 mL | Freq: Two times a day (BID) | ORAL | 0 refills | Status: DC

## 2017-02-06 MED ORDER — AMLODIPINE BESYLATE 10 MG PO TABS: 10.0000 mg | ORAL_TABLET | Freq: Every day | ORAL | 0 refills | Status: DC

## 2017-02-06 MED ORDER — NAPROXEN 250 MG PO TABS
250.0000 mg | ORAL_TABLET | Freq: Once | ORAL | Status: AC
  Administered 2017-02-06: 250 mg via ORAL
  Filled 2017-02-06: qty 1

## 2017-02-06 NOTE — Discharge Instructions (Signed)
Hyponatremia Introduction Hyponatremia is when the amount of salt (sodium) in your blood is too low. When sodium levels are low, your cells absorb extra water and they swell. The swelling happens throughout the body, but it mostly affects the brain. What are the causes? This condition may be caused by:  Heart, kidney, or liver problems.  Thyroid problems.  Adrenal gland problems.  Metabolic conditions, such as syndrome of inappropriate antidiuretic hormone (SIADH).  Severe vomiting and diarrhea.  Certain medicines or illegal drugs.  Dehydration.  Drinking too much water.  Eating a diet that is low in sodium.  Large burns on your body.  Sweating. What increases the risk? This condition is more likely to develop in people who:  Have long-term (chronic) kidney disease.  Have heart failure.  Have a medical condition that causes frequent or excessive diarrhea.  Have metabolic conditions, such as Addison disease or SIADH.  Take certain medicines that affect the sodium and fluid balance in the blood. Some of these medicine types include:  Diuretics.  NSAIDs.  Some opioid pain medicines.  Some antidepressants.  Some seizure prevention medicines. What are the signs or symptoms? Symptoms of this condition include:  Nausea and vomiting.  Confusion.  Lethargy.  Agitation.  Headache.  Seizures.  Unconsciousness.  Appetite loss.  Muscle weakness and cramping.  Feeling weak or light-headed.  Having a rapid heart rate.  Fainting, in severe cases. How is this diagnosed? This condition is diagnosed with a medical history and physical exam. You will also have other tests, including:  Blood tests.  Urine tests. How is this treated? Treatment for this condition depends on the cause. Treatment may include:  Fluids given through an IV tube that is inserted into one of your veins.  Medicines to correct the sodium imbalance. If medicines are causing the  condition, the medicines will need to be adjusted.  Limiting water or fluid intake to get the correct sodium balance. Follow these instructions at home:  Take medicines only as directed by your health care provider. Many medicines can make this condition worse. Talk with your health care provider about any medicines that you are currently taking.  Carefully follow a recommended diet as directed by your health care provider.  Carefully follow instructions from your health care provider about fluid restrictions.  Keep all follow-up visits as directed by your health care provider. This is important.  Do not drink alcohol. Contact a health care provider if:  You develop worsening nausea, fatigue, headache, confusion, or weakness.  Your symptoms go away and then return.  You have problems following the recommended diet. Get help right away if:  You have a seizure.  You faint.  You have ongoing diarrhea or vomiting. This information is not intended to replace advice given to you by your health care provider. Make sure you discuss any questions you have with your health care provider. Document Released: 12/05/2002 Document Revised: 05/22/2016 Document Reviewed: 01/04/2015  2017 [employer]

## 2017-02-06 NOTE — Discharge Summary (Signed)
Physician Discharge Summary  Melissa Fischer [id number] DOB: [birth year] DOA: 01/31/2017  PCP: Mathews Argyle, MD  Admit date: 01/31/2017 Discharge date: 02/06/2017  [facility]  Disposition:  SNF  Recommendations for Outpatient Follow-up:  1. Repeat sodium level on Monday and adjustments to sodium chloride/lasix as needed 2. Blood pressure monitoring 3. Discussed starting venlafaxine 75mg  once daily or concerta 2.5mg  once daily for depression/apathy and poor appetite, but daughter would like to wait until mother has adjusted to new living environment before trying anything new 4. Please avoid ACEI/ARB  Home Health:  Ongoing PT/OT at SNF  Equipment/Devices:  none  Discharge Condition:  Stable, improved CODE STATUS:  Full code  Diet recommendation:  Regular with a 1.2L fluid restriction  Brief/Interim Summary:   Melissa Fischer a 83 y.o.femalewith medical history significant forAlzheimer's dementia, type 2 diabetes mellitus, COPD, and cardiac syncope status-post pacer placement with no recurrence, presented to the emergency department for evaluation of generalized weakness, confusion, and nausea. Patient was accompanied by her daughter who assisted with the history. The patient had been admitted on 01/25/2017 with very similar symptoms, found to have influenza A, was treated with Tamiflu and given IV hydration, and reports returning to her usual state by time of discharge. Unfortunately, she had a recurrence in weakness and confusion and was readmitted with hyponatremia and encephalopathy.  Hyponatremia was thought to be related to SIADH possibly due to her pneumonia or antidepressant. Wellbutrin was stopped.  She was given hypertonic saline which corrected her sodium to 130 but her hyponatremia recurred after cessation of fluids.  She was started on lasix and sodium chloride tabs by nephrology and her sodium has been gradually trending up.  She will need ongoing  monitoring and adjustment of her medications at SNF over the next few weeks.    Discharge Diagnoses:  Principal Problem:   Acute encephalopathy Active Problems:   Depression   Alzheimer's dementia with behavioral disturbance   Type 2 diabetes mellitus with hyperglycemia (HCC)   COPD (chronic obstructive pulmonary disease) (HCC)   Hyponatremia   Encephalopathy acute  Acute encephalopathy; Metabolic Probably related to recent tamiflu and hyponatremia with underlying dementia.  - confusion has gradually improved -CT head negative.  - No evidence of infection: chest x ray negative, UA negative.  -Ammonia 20. TSH normal -Sodium gradually trending up  Hyponatremia due to SIADH, 123 on admission - Initially sodium increase to 125 with IV fluids but then decreased to 120.  - She was started on 3% hypertonic through picc line. - Appreciate Nephrology,Dr [place]hertz assistance - Wellbutrin has been stopped - cortisol and TSH normal.  - Continue lasix, water restriction and sodium tablets -  Repeat BMP on Monday and as needed per new supervising physician  Alzheimer with depression and anxiety, at baseline patient is able to recognize daughter and is oriented to person.  - oriented to person - has been tearful and expressed hopelessness to staff and daughter - had to stop wellbutrin due to hyponatremia - Psych recommended a trial of venlafaxine with close monitoring of sodium - Alternatively could try concerta 2.5mg  once daily which may also stimulate appetite - Daughter would like to hold off for now on trying new medications and wait until her mother is settled in to her new environment  HTN; started Norvasc 2-05.  Tried hydralazine but this resulted in sinus tachycardia.  Started labetalol 100mg  TID  Diabetes mellitus, A1c 7.9 -  On regular diet as not eating well -  Continue glucerna -  Continue moderate dose SSI with HS   Poor oral intake -  Regular diet with supplements -   Nutrition assisted with supplements  Discharge Instructions  Discharge Instructions    Call MD for:  difficulty breathing, headache or visual disturbances    Complete by:  As directed    Call MD for:  extreme fatigue    Complete by:  As directed    Call MD for:  hives    Complete by:  As directed    Call MD for:  persistant dizziness or light-headedness    Complete by:  As directed    Call MD for:  persistant nausea and vomiting    Complete by:  As directed    Call MD for:  severe uncontrolled pain    Complete by:  As directed    Call MD for:  temperature >100.4    Complete by:  As directed    Diet general    Complete by:  As directed    Increase activity slowly    Complete by:  As directed        Medication List    STOP taking these medications   buPROPion 75 MG tablet Commonly known as:  WELLBUTRIN   ciprofloxacin 500 MG tablet Commonly known as:  CIPRO   ibuprofen 600 MG tablet Commonly known as:  ADVIL,MOTRIN     TAKE these medications   amLODipine 10 MG tablet Commonly known as:  NORVASC Take 1 tablet (10 mg total) by mouth daily.   calcium-vitamin D 250-125 MG-UNIT tablet Commonly known as:  OSCAL WITH D Take 1 tablet by mouth daily.   DELSYM 30 MG/5ML liquid Generic drug:  dextromethorphan Take 60 mg by mouth every 12 (twelve) hours as needed for cough.   feeding supplement (GLUCERNA SHAKE) Liqd Take 237 mLs by mouth 2 (two) times daily between meals.   fexofenadine 180 MG tablet Commonly known as:  ALLEGRA Take 180 mg by mouth daily.   Fluticasone-Salmeterol 250-50 MCG/DOSE Aepb Commonly known as:  ADVAIR Inhale 1 puff into the lungs 2 (two) times daily.   FOSAMAX 70 MG tablet Generic drug:  alendronate Take 70 mg by mouth every Monday. Take with a full glass of water on an empty stomach.   furosemide 20 MG tablet Commonly known as:  LASIX Take 1 tablet (20 mg total) by mouth daily.   labetalol 100 MG tablet Commonly known as:   NORMODYNE Take 1 tablet (100 mg total) by mouth 3 (three) times daily.   metFORMIN 500 MG 24 hr tablet Commonly known as:  GLUCOPHAGE-XR Take 500 mg by mouth daily with breakfast.   multivitamin with minerals Tabs tablet Take 1 tablet by mouth daily.   polyethylene glycol packet Commonly known as:  MIRALAX / GLYCOLAX Take 17 g by mouth daily as needed for mild constipation.      Follow-up [place], MD Follow up.   Specialty:  Internal Medicine Contact information: [address] [place] [address] [phone]          Allergies  Allergen Reactions  . Aricept [Donepezil] Other (See Comments)    Leg cramps  . Exelon [Rivastigmine] Other (See Comments)    dizziness and sad  . Namenda [Memantine] Other (See Comments)    dizziness  . Penicillins     "pt does not know its been so long" Has patient had a PCN reaction causing immediate rash, facial/tongue/throat swelling,  SOB or lightheadedness with hypotension: Unknown Has patient had a PCN reaction causing severe rash involving mucus membranes or skin necrosis: Unknown Has patient had a PCN reaction that required hospitalization: Unknown Has patient had a PCN reaction occurring within the last 10 years: Unknown If all of the above answers are "NO", then may proceed with Cephalosporin use.   . Statins Other (See Comments)    unknown  . Sulfa Antibiotics Other (See Comments)    Unknown    Consultations:  Nephrology   Procedures/Studies: Dg Chest 2 View  Result Date: 01/31/2017 CLINICAL DATA:  Weakness.  Recent diagnosis of flu. EXAM: CHEST  2 VIEW COMPARISON:  01/25/2017 FINDINGS: Left-sided pacemaker remains in place. Unchanged heart size and mediastinal contours. No pulmonary edema, focal airspace disease, pleural fluid or pneumothorax. Chronic mild compression deformity in the lower thoracic spine. Chronic change about the left proximal humerus. IMPRESSION: No acute  abnormality or change from prior exam. Electronically Signed   By: Jeb Levering M.D.   On: 01/31/2017 21:27   Dg Chest 2 View  Result Date: 01/25/2017 CLINICAL DATA:  Fever and cough EXAM: CHEST  2 VIEW COMPARISON:  04/26/2016 FINDINGS: Heart size within normal limits. Dual lead pacemaker unchanged. Negative for heart failure. Lungs are clear without infiltrate effusion or mass. No change from the prior study. IMPRESSION: No active cardiopulmonary disease. Electronically Signed   By: Franchot Gallo M.D.   On: 01/25/2017 06:30   Ct Head Wo Contrast  Result Date: 01/31/2017 CLINICAL DATA:  pt's mental status is altered, pt cannot find the words to complete a sentence. Also pt is very weak and unable to ambulate at this time. Per pt's daughter these sx were all present during last visit and admission. EXAM: CT HEAD WITHOUT CONTRAST TECHNIQUE: Contiguous axial images were obtained from the base of the skull through the vertex without intravenous contrast. COMPARISON:  04/25/2016 FINDINGS: Brain: There is central and cortical atrophy. Periventricular white matter changes are consistent with small vessel disease. There is no intra or extra-axial fluid collection or mass lesion. The basilar cisterns and ventricles have a normal appearance. There is no CT evidence for acute infarction or hemorrhage. Vascular: There is atherosclerotic calcification of the carotid siphons. Skull: Normal. Negative for fracture or focal lesion. Sinuses/Orbits: No acute finding. Other: None IMPRESSION: 1. Atrophy and small vessel disease. 2.  No evidence for acute intracranial abnormality. Electronically Signed   By: Nolon Nations M.D.   On: 01/31/2017 21:34   Subjective:  Having ongoing left neck pain and stiffness.  No other pains, difficulty breathing, nausea.  Aid states she has been eating well.    Discharge Exam: Vitals:   02/05/17 2057 02/06/17 0508  BP: (!) 116/56 (!) 156/74  Pulse: (!) 105 100  Resp: 18 18   Temp: 98.2 F (36.8 C) 98.5 F (36.9 C)   Vitals:   02/06/17 0500 02/06/17 0508 02/06/17 0752 02/06/17 0754  BP:  (!) 156/74    Pulse:  100    Resp:  18    Temp:  98.5 F (36.9 C)    TempSrc:  Oral    SpO2:  94% 97% 97%  Weight: 70.6 kg (155 lb 11.2 oz)     Height:        General exam:  Adult female.  No acute distress.   HEENT:  NCAT, MMM.  Head turned to the right and muscle spasms on back of neck.  Heating pack applied Respiratory system: Clear to auscultation  bilaterally Cardiovascular system: Regular rate and rhythm, normal 99991111. 2/6 systolic murmur, rubs, gallops or clicks.  Warm extremities Gastrointestinal system: Normal active bowel sounds, soft, nondistended, nontender. MSK:  Normal tone and bulk, no lower extremity edema Neuro:  Grossly moves all extremities    The results of significant diagnostics from this hospitalization (including imaging, microbiology, ancillary and laboratory) are listed below for reference.     Microbiology: Recent Results (from the past 240 hour(s))  Urine culture     Status: Abnormal   Collection Time: 02/01/17 12:33 AM  Result Value Ref Range Status   Specimen Description URINE, CATHETERIZED  Final   Special Requests NONE  Final   Culture (A)  Final    <10,000 COLONIES/mL INSIGNIFICANT GROWTH Performed at [facility], [address], [place], [place] [address]    Report Status 02/02/2017 FINAL  Final  MRSA PCR Screening     Status: None   Collection Time: 02/02/17  2:00 PM  Result Value Ref Range Status   MRSA by PCR NEGATIVE NEGATIVE Final    Comment:        The GeneXpert MRSA Assay (FDA approved for NASAL specimens only), is one component of a comprehensive MRSA colonization surveillance program. It is not intended to diagnose MRSA infection nor to guide or monitor treatment for MRSA infections.      Labs: BNP (last 3 results) No results for input(s): BNP in the last 8760 hours. Basic Metabolic  Panel:  Recent Labs Lab 02/04/17 1130 02/04/17 1839 02/04/17 2304 02/05/17 0533 02/06/17 0833  NA 126* 125* 127* 127* 131*  K 3.7 3.5 3.6 3.7 3.3*  CL 94* 92* 94* 94* 98*  CO2 23 24 25 22 26   GLUCOSE 198* 282* 180* 212* 229*  BUN 19 15 14 14 17   CREATININE 0.80 0.76 0.69 0.65 0.68  CALCIUM 8.5* 8.7* 8.6* 8.6* 8.7*   Liver Function Tests:  Recent Labs Lab 01/31/17 2010  AST 37  ALT 24  ALKPHOS 55  BILITOT 0.8  PROT 7.3  ALBUMIN 4.2   No results for input(s): LIPASE, AMYLASE in the last 168 hours.  Recent Labs Lab 02/01/17 0041  AMMONIA 20   CBC:  Recent Labs Lab 01/31/17 2010 02/03/17 1500  WBC 9.5 10.4  HGB 12.3 12.3  HCT 33.8* 34.2*  MCV 82.2 82.4  PLT 246 263   Cardiac Enzymes: No results for input(s): CKTOTAL, CKMB, CKMBINDEX, TROPONINI in the last 168 hours. BNP: Invalid input(s): POCBNP CBG:  Recent Labs Lab 02/05/17 0736 02/05/17 1134 02/05/17 1705 02/05/17 2059 02/06/17 0720  GLUCAP 198* 260* 224* 218* 212*   D-Dimer No results for input(s): DDIMER in the last 72 hours. Hgb A1c No results for input(s): HGBA1C in the last 72 hours. Lipid Profile No results for input(s): CHOL, HDL, LDLCALC, TRIG, CHOLHDL, LDLDIRECT in the last 72 hours. Thyroid function studies No results for input(s): TSH, T4TOTAL, T3FREE, THYROIDAB in the last 72 hours.  Invalid input(s): FREET3 Anemia work up No results for input(s): VITAMINB12, FOLATE, FERRITIN, TIBC, IRON, RETICCTPCT in the last 72 hours. Urinalysis    Component Value Date/Time   COLORURINE YELLOW 02/01/2017 0034   APPEARANCEUR CLEAR 02/01/2017 0034   LABSPEC 1.014 02/01/2017 0034   PHURINE 7.0 02/01/2017 0034   GLUCOSEU >=500 (A) 02/01/2017 0034   HGBUR NEGATIVE 02/01/2017 0034   BILIRUBINUR NEGATIVE 02/01/2017 0034   KETONESUR NEGATIVE 02/01/2017 0034   PROTEINUR NEGATIVE 02/01/2017 0034   NITRITE NEGATIVE 02/01/2017 0034   LEUKOCYTESUR  NEGATIVE 02/01/2017 0034   Sepsis  Labs Invalid input(s): PROCALCITONIN,  WBC,  LACTICIDVEN   Time coordinating discharge: Over 30 minutes  SIGNED:   Janece Canterbury, MD  [facility] 02/06/2017, 10:38 AM Pager   If 7PM-7AM, please contact night-coverage [email or web address] Password [email or web address]

## 2017-02-06 NOTE — Progress Notes (Signed)
Patient daughter completing admission paperwork at 1:00pm [facility] scheduled for pick up for 2:00pm.  RN and daughter informed.   Kathrin Greathouse, Latanya Presser, MSW Clinical Social Worker 5E and Psychiatric Service Line [phone] 02/06/2017  12:30 PM

## 2017-02-06 NOTE — Progress Notes (Signed)
Called report to SNF. Gave report to RN  

## 2017-02-06 NOTE — Clinical Social Work Placement (Signed)
   CLINICAL SOCIAL WORK PLACEMENT  NOTE  Date:  02/06/2017  Patient Details  Name: Melissa Fischer MRN: [id number] Date of Birth: 12/18/1934  Clinical Social Work is seeking post-discharge placement for this patient at the [place] level of care (*CSW will initial, date and re-position this form in  chart as items are completed):  Yes   Patient/family provided with [place] Work Department's list of facilities offering this level of care within the geographic area requested by the patient (or if unable, by the patient's family).  Yes   Patient/family informed of their freedom to choose among providers that offer the needed level of care, that participate in Medicare, Medicaid or managed care program needed by the patient, have an available bed and are willing to accept the patient.  Yes   Patient/family informed of [facility]'s ownership interest in [facility] and [facility], as well as of the fact that they are under no obligation to receive care at these facilities.  PASRR submitted to EDS on       PASRR number received on       Existing PASRR number confirmed on 02/05/17     FL2 transmitted to all facilities in geographic area requested by pt/family on       FL2 transmitted to all facilities within larger geographic area on       Patient informed that his/her managed care company has contracts with or will negotiate with certain facilities, including the following:  [name]         Patient/family informed of bed offers received.  Patient chooses bed at [facility]     Physician recommends and patient chooses bed at      Patient to be transferred to [facility] on 02/06/17.  Patient to be transferred to facility by [facility]     Patient family notified on 02/06/17 of transfer.  Name of family member notified:  Daughter     PHYSICIAN       Additional Comment:     _______________________________________________ Lia Hopping, [name] 02/06/2017, 11:02 AM

## 2017-02-06 NOTE — Progress Notes (Signed)
Date: February 06, 2017 Discharge orders checked for needs. No case management needs present at time of discharge. Velva Harman, RN, BSN, [place]   [phone]

## 2017-02-06 NOTE — Evaluation (Addendum)
Occupational Therapy Evaluation Patient Details Name: Melissa Fischer MRN: [id number] DOB: 1934-01-15 Today's Date: 02/06/2017    History of Present Illness 83 y.o. female admitted with acute encephalopathy, hyponatremia,  confusion. Hx of Alzheimer's dementia, COPD, T2DM, PPM, R hip pinning 2017   Clinical Impression   Pt was admitted for the above.  She will benefit from continued OT with goal of working on strength, endurance and balance for toilet transfers and hygiene as she was able to perform this at mod I level at baseline.  She has daytime caregivers who assist with all other adls.    Follow Up Recommendations  SNF;Supervision/Assistance - 24 hour (needs 2 caregivers at home for mobility)    Equipment Recommendations   (to be further assessed)    Recommendations for Other Services       Precautions / Restrictions Precautions Precautions: Fall Precaution Comments: Hx of rotator cuff injury-limited use of R UE Restrictions Weight Bearing Restrictions: No      Mobility Bed Mobility         Supine to sit: Max assist Sit to supine: Max assist;+2 for physical assistance   General bed mobility comments: cargiver assisted with getting pt back to bed  Transfers                 General transfer comment: could not attempt this session due to decreased trunk control in sitting    Balance     Sitting balance-Leahy Scale: Poor                                      ADL Overall ADL's : Needs assistance/impaired                                       General ADL Comments: pt has total A for adls at baseline, except for toileting.  Assisted pt in getting to EOB, but she was unable to sit unsupported. She was leaning both R and posteriorly.  Could not safely transfer to commode.     Vision     Perception     Praxis      Pertinent Vitals/Pain Pain Assessment: Faces Faces Pain Scale: Hurts even more Pain Location: neck Pain  Intervention(s): Limited activity within patient's tolerance;Monitored during session;Repositioned (heat pack to caregiver for after ADL (she was to do next))     Hand Dominance Right   Extremity/Trunk Assessment Upper Extremity Assessment Upper Extremity Assessment: RUE deficits/detail RUE Deficits / Details: h/o rotator cuff injury; limited use.  Generalized weakness in LUE           Communication Communication Communication: No difficulties   Cognition Arousal/Alertness: Lethargic Behavior During Therapy: WFL for tasks assessed/performed Overall Cognitive Status: Impaired/Different from baseline                 General Comments: pt was following commands with extra time; she cued herself verbally for what she wanted to do.  Had spoken to RN prior to evaluation; pt doing better following commands now than she did when admitted.   General Comments       Exercises       Shoulder Instructions      Home Living Family/patient expects to be discharged to::  (will likely need SNF) Living Arrangements: Alone  Home Equipment: [place] - 2 wheels;Walker - 4 wheels   Additional Comments: caregivers 7days a wk, 8am-4pm and 7pm-10pm; pt is alone 10p to 8a and is able to amb to bathroom at night I'ly      Prior Functioning/Environment    Gait / Transfers Assistance Needed: prefers 2 wheeled walker, also furniture walks     Comments: assistance with ADLs; pt got to toilet at night herself (mod I)        OT Problem List: Decreased strength;Decreased activity tolerance;Impaired balance (sitting and/or standing);Decreased cognition;Decreased safety awareness;Pain   OT Treatment/Interventions: Self-care/ADL training;DME and/or AE instruction;Therapeutic activities;Cognitive remediation/compensation;Patient/family education;Balance training    OT Goals(Current goals can be found in the care plan section) Acute Rehab OT Goals Patient  Stated Goal: family's:  for pt to get strength back OT Goal Formulation: With family Time For Goal Achievement: 02/13/17 Potential to Achieve Goals: Fair ADL Goals Pt Will Transfer to Toilet: with mod assist;with +2 assist;stand pivot transfer;bedside commode Additional ADL Goal #1: pt will sit eob unsupported with min guard for 2 minutes in preparation for toilet transfer Additional ADL Goal #2: pt will go from sit to stand with mod +2 assist for adls, and maintain with min A for 2 minutes  OT Frequency: Min 2X/week   Barriers to D/C:            Co-evaluation              End of Session    Activity Tolerance: Patient limited by fatigue Patient left: in bed;with call bell/phone within reach;with family/visitor present;with nursing/sitter in room (NT to turn alarm on after ADL)   Time: [id number] OT Time Calculation (min): 16 min Charges:  OT General Charges $OT Visit: 1 Procedure OT Evaluation $OT Eval Moderate Complexity: 1 Procedure G-Codes:    Cristian Davitt 02/10/17, 10:48 AM Lesle Chris, OTR/L [phone] 02-10-2017

## 2017-02-09 DIAGNOSIS — E222 Syndrome of inappropriate secretion of antidiuretic hormone: Secondary | ICD-10-CM | POA: Diagnosis not present

## 2017-02-09 DIAGNOSIS — E871 Hypo-osmolality and hyponatremia: Secondary | ICD-10-CM | POA: Diagnosis not present

## 2017-02-09 DIAGNOSIS — J101 Influenza due to other identified influenza virus with other respiratory manifestations: Secondary | ICD-10-CM | POA: Diagnosis not present

## 2017-02-09 DIAGNOSIS — E119 Type 2 diabetes mellitus without complications: Secondary | ICD-10-CM | POA: Diagnosis not present

## 2017-02-09 DIAGNOSIS — J449 Chronic obstructive pulmonary disease, unspecified: Secondary | ICD-10-CM | POA: Diagnosis not present

## 2017-02-09 DIAGNOSIS — F0390 Unspecified dementia without behavioral disturbance: Secondary | ICD-10-CM | POA: Diagnosis not present

## 2017-02-17 DIAGNOSIS — J449 Chronic obstructive pulmonary disease, unspecified: Secondary | ICD-10-CM | POA: Diagnosis not present

## 2017-02-17 DIAGNOSIS — G309 Alzheimer's disease, unspecified: Secondary | ICD-10-CM | POA: Diagnosis not present

## 2017-02-17 DIAGNOSIS — E222 Syndrome of inappropriate secretion of antidiuretic hormone: Secondary | ICD-10-CM | POA: Diagnosis not present

## 2017-02-17 DIAGNOSIS — E119 Type 2 diabetes mellitus without complications: Secondary | ICD-10-CM | POA: Diagnosis not present

## 2017-02-20 DIAGNOSIS — L84 Corns and callosities: Secondary | ICD-10-CM | POA: Diagnosis not present

## 2017-02-20 DIAGNOSIS — R41841 Cognitive communication deficit: Secondary | ICD-10-CM | POA: Diagnosis not present

## 2017-02-20 DIAGNOSIS — M6281 Muscle weakness (generalized): Secondary | ICD-10-CM | POA: Diagnosis not present

## 2017-02-20 DIAGNOSIS — R278 Other lack of coordination: Secondary | ICD-10-CM | POA: Diagnosis not present

## 2017-02-20 DIAGNOSIS — I739 Peripheral vascular disease, unspecified: Secondary | ICD-10-CM | POA: Diagnosis not present

## 2017-02-20 DIAGNOSIS — L603 Nail dystrophy: Secondary | ICD-10-CM | POA: Diagnosis not present

## 2017-02-23 DIAGNOSIS — M6281 Muscle weakness (generalized): Secondary | ICD-10-CM | POA: Diagnosis not present

## 2017-02-23 DIAGNOSIS — R278 Other lack of coordination: Secondary | ICD-10-CM | POA: Diagnosis not present

## 2017-02-23 DIAGNOSIS — R41841 Cognitive communication deficit: Secondary | ICD-10-CM | POA: Diagnosis not present

## 2017-02-24 DIAGNOSIS — R262 Difficulty in walking, not elsewhere classified: Secondary | ICD-10-CM | POA: Diagnosis not present

## 2017-02-24 DIAGNOSIS — R2681 Unsteadiness on feet: Secondary | ICD-10-CM | POA: Diagnosis not present

## 2017-02-25 DIAGNOSIS — R278 Other lack of coordination: Secondary | ICD-10-CM | POA: Diagnosis not present

## 2017-02-25 DIAGNOSIS — R41841 Cognitive communication deficit: Secondary | ICD-10-CM | POA: Diagnosis not present

## 2017-02-25 DIAGNOSIS — M6281 Muscle weakness (generalized): Secondary | ICD-10-CM | POA: Diagnosis not present

## 2017-02-26 DIAGNOSIS — R262 Difficulty in walking, not elsewhere classified: Secondary | ICD-10-CM | POA: Diagnosis not present

## 2017-02-26 DIAGNOSIS — R2681 Unsteadiness on feet: Secondary | ICD-10-CM | POA: Diagnosis not present

## 2017-02-27 DIAGNOSIS — R41841 Cognitive communication deficit: Secondary | ICD-10-CM | POA: Diagnosis not present

## 2017-03-02 DIAGNOSIS — R2681 Unsteadiness on feet: Secondary | ICD-10-CM | POA: Diagnosis not present

## 2017-03-02 DIAGNOSIS — R262 Difficulty in walking, not elsewhere classified: Secondary | ICD-10-CM | POA: Diagnosis not present

## 2017-03-02 DIAGNOSIS — R41841 Cognitive communication deficit: Secondary | ICD-10-CM | POA: Diagnosis not present

## 2017-03-03 DIAGNOSIS — R2681 Unsteadiness on feet: Secondary | ICD-10-CM | POA: Diagnosis not present

## 2017-03-03 DIAGNOSIS — R262 Difficulty in walking, not elsewhere classified: Secondary | ICD-10-CM | POA: Diagnosis not present

## 2017-03-04 DIAGNOSIS — R41841 Cognitive communication deficit: Secondary | ICD-10-CM | POA: Diagnosis not present

## 2017-03-06 DIAGNOSIS — R41841 Cognitive communication deficit: Secondary | ICD-10-CM | POA: Diagnosis not present

## 2017-03-09 DIAGNOSIS — E871 Hypo-osmolality and hyponatremia: Secondary | ICD-10-CM | POA: Diagnosis not present

## 2017-03-10 DIAGNOSIS — G309 Alzheimer's disease, unspecified: Secondary | ICD-10-CM | POA: Diagnosis not present

## 2017-03-10 DIAGNOSIS — I10 Essential (primary) hypertension: Secondary | ICD-10-CM | POA: Diagnosis not present

## 2017-03-10 DIAGNOSIS — R41841 Cognitive communication deficit: Secondary | ICD-10-CM | POA: Diagnosis not present

## 2017-03-10 DIAGNOSIS — E1140 Type 2 diabetes mellitus with diabetic neuropathy, unspecified: Secondary | ICD-10-CM | POA: Diagnosis not present

## 2017-03-10 DIAGNOSIS — R2681 Unsteadiness on feet: Secondary | ICD-10-CM | POA: Diagnosis not present

## 2017-03-10 DIAGNOSIS — R262 Difficulty in walking, not elsewhere classified: Secondary | ICD-10-CM | POA: Diagnosis not present

## 2017-03-10 DIAGNOSIS — Z7984 Long term (current) use of oral hypoglycemic drugs: Secondary | ICD-10-CM | POA: Diagnosis not present

## 2017-03-10 DIAGNOSIS — E222 Syndrome of inappropriate secretion of antidiuretic hormone: Secondary | ICD-10-CM | POA: Diagnosis not present

## 2017-03-11 DIAGNOSIS — R2681 Unsteadiness on feet: Secondary | ICD-10-CM | POA: Diagnosis not present

## 2017-03-11 DIAGNOSIS — R262 Difficulty in walking, not elsewhere classified: Secondary | ICD-10-CM | POA: Diagnosis not present

## 2017-03-11 DIAGNOSIS — R41841 Cognitive communication deficit: Secondary | ICD-10-CM | POA: Diagnosis not present

## 2017-03-16 DIAGNOSIS — E222 Syndrome of inappropriate secretion of antidiuretic hormone: Secondary | ICD-10-CM | POA: Diagnosis not present

## 2017-03-19 DIAGNOSIS — R262 Difficulty in walking, not elsewhere classified: Secondary | ICD-10-CM | POA: Diagnosis not present

## 2017-03-19 DIAGNOSIS — R2681 Unsteadiness on feet: Secondary | ICD-10-CM | POA: Diagnosis not present

## 2017-03-20 DIAGNOSIS — Z79899 Other long term (current) drug therapy: Secondary | ICD-10-CM | POA: Diagnosis not present

## 2017-03-23 DIAGNOSIS — R2681 Unsteadiness on feet: Secondary | ICD-10-CM | POA: Diagnosis not present

## 2017-03-23 DIAGNOSIS — R262 Difficulty in walking, not elsewhere classified: Secondary | ICD-10-CM | POA: Diagnosis not present

## 2017-03-24 DIAGNOSIS — R262 Difficulty in walking, not elsewhere classified: Secondary | ICD-10-CM | POA: Diagnosis not present

## 2017-03-24 DIAGNOSIS — Z79899 Other long term (current) drug therapy: Secondary | ICD-10-CM | POA: Diagnosis not present

## 2017-03-24 DIAGNOSIS — R350 Frequency of micturition: Secondary | ICD-10-CM | POA: Diagnosis not present

## 2017-03-24 DIAGNOSIS — R2681 Unsteadiness on feet: Secondary | ICD-10-CM | POA: Diagnosis not present

## 2017-03-25 ENCOUNTER — Ambulatory Visit
Admission: RE | Admit: 2017-03-25 | Discharge: 2017-03-25 | Disposition: A | Discharge status: Home / Self Care | Payer: Medicare Other | Source: Ambulatory Visit | Attending: Internal Medicine | Admitting: Internal Medicine

## 2017-03-25 ENCOUNTER — Other Ambulatory Visit: Payer: Self-pay | Admitting: Internal Medicine

## 2017-03-25 DIAGNOSIS — R531 Weakness: Secondary | ICD-10-CM | POA: Diagnosis not present

## 2017-03-25 DIAGNOSIS — E559 Vitamin D deficiency, unspecified: Secondary | ICD-10-CM | POA: Diagnosis not present

## 2017-03-25 DIAGNOSIS — E871 Hypo-osmolality and hyponatremia: Secondary | ICD-10-CM | POA: Diagnosis not present

## 2017-03-25 DIAGNOSIS — M791 Myalgia: Secondary | ICD-10-CM | POA: Diagnosis not present

## 2017-03-25 DIAGNOSIS — M542 Cervicalgia: Secondary | ICD-10-CM | POA: Diagnosis not present

## 2017-03-26 DIAGNOSIS — R2681 Unsteadiness on feet: Secondary | ICD-10-CM | POA: Diagnosis not present

## 2017-03-26 DIAGNOSIS — R262 Difficulty in walking, not elsewhere classified: Secondary | ICD-10-CM | POA: Diagnosis not present

## 2017-03-30 ENCOUNTER — Ambulatory Visit
Admission: RE | Admit: 2017-03-30 | Discharge: 2017-03-30 | Disposition: A | Discharge status: Home / Self Care | Payer: Medicare Other | Source: Ambulatory Visit | Attending: Geriatric Medicine | Admitting: Geriatric Medicine

## 2017-03-30 ENCOUNTER — Other Ambulatory Visit: Payer: Self-pay | Admitting: Geriatric Medicine

## 2017-03-30 DIAGNOSIS — I10 Essential (primary) hypertension: Secondary | ICD-10-CM | POA: Diagnosis not present

## 2017-03-30 DIAGNOSIS — J449 Chronic obstructive pulmonary disease, unspecified: Secondary | ICD-10-CM | POA: Diagnosis not present

## 2017-03-30 DIAGNOSIS — M542 Cervicalgia: Secondary | ICD-10-CM

## 2017-03-30 DIAGNOSIS — E119 Type 2 diabetes mellitus without complications: Secondary | ICD-10-CM | POA: Diagnosis not present

## 2017-03-30 DIAGNOSIS — Z7984 Long term (current) use of oral hypoglycemic drugs: Secondary | ICD-10-CM | POA: Diagnosis not present

## 2017-03-31 DIAGNOSIS — R262 Difficulty in walking, not elsewhere classified: Secondary | ICD-10-CM | POA: Diagnosis not present

## 2017-03-31 DIAGNOSIS — R2681 Unsteadiness on feet: Secondary | ICD-10-CM | POA: Diagnosis not present

## 2017-04-01 DIAGNOSIS — R262 Difficulty in walking, not elsewhere classified: Secondary | ICD-10-CM | POA: Diagnosis not present

## 2017-04-01 DIAGNOSIS — R2681 Unsteadiness on feet: Secondary | ICD-10-CM | POA: Diagnosis not present

## 2017-04-02 ENCOUNTER — Ambulatory Visit (INDEPENDENT_AMBULATORY_CARE_PROVIDER_SITE_OTHER): Payer: Medicare Other | Admitting: Nurse Practitioner

## 2017-04-02 ENCOUNTER — Encounter: Payer: Self-pay | Admitting: Nurse Practitioner

## 2017-04-02 VITALS — BP 111/55 | HR 74 | Wt 162.4 lb

## 2017-04-02 DIAGNOSIS — R269 Unspecified abnormalities of gait and mobility: Secondary | ICD-10-CM | POA: Diagnosis not present

## 2017-04-02 DIAGNOSIS — F02818 Dementia in other diseases classified elsewhere, unspecified severity, with other behavioral disturbance: Secondary | ICD-10-CM

## 2017-04-02 DIAGNOSIS — F0281 Dementia in other diseases classified elsewhere with behavioral disturbance: Secondary | ICD-10-CM

## 2017-04-02 DIAGNOSIS — G301 Alzheimer's disease with late onset: Secondary | ICD-10-CM | POA: Diagnosis not present

## 2017-04-02 DIAGNOSIS — R262 Difficulty in walking, not elsewhere classified: Secondary | ICD-10-CM | POA: Diagnosis not present

## 2017-04-02 DIAGNOSIS — R2681 Unsteadiness on feet: Secondary | ICD-10-CM | POA: Diagnosis not present
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
## 2017-04-02 NOTE — Patient Instructions (Addendum)
Patient did not tolerate Exelon, Namenda or Aricept. Independent setting seems adequate at present with supervision Continue Physical therapy and rolling walker Continue activities for socialization Follow up in 6 to 8 months  Alzheimer Disease Caregiver Guide Alzheimer disease is an illness that affects a person's brain. It causes a person to lose the ability to remember things and make good decisions. As the disease progresses, the person is unable to take care of himself or herself and needs more and more help to do simple tasks. Taking care of someone with Alzheimer disease can be very challenging and overwhelming. Memory loss and confusion Memory loss and confusion is mild in the beginning stages of the disease. Both of these problems become more severe as the disease progresses. Eventually, the person will not recognize places or even close family members and friends.  Stay calm.  Respond with a short explanation. Long explanations can be overwhelming and confusing.  Avoid corrections that sound like scolding.  Try not to take it personally, even if the person forgets your name. Behavior changes Behavior changes are part of the disease. The person may develop depression, anxiety, anger, hallucinations, or other behavior changes. These changes can come on suddenly and may be in response to pain, infection, changes in the environment (temperature, noise), overstimulation, or feeling lost or scared.  Try not to take behavior changes personally.  Remain calm and patient.  Do not argue or try to convince the person about a specific point. This will only make him or her more agitated.  Know that the behavior changes are part of the disease process and try to work through it. Tips to reduce frustration  Schedule wisely by making appointments and doing daily tasks, like bathing and dressing, when the person is at his or her best.  Take your time. Simple tasks may take a lot longer, so  be sure to allow for plenty of time.  Limit choices. Too many choices can be overwhelming and stressful for the person.  Involve the person in what you are doing.  Stick to a routine.  Avoid new or crowded situations, if possible.  Use simple words, short sentences, and a calm voice. Only give one direction at a time.  Buy clothes and shoes that are easy to put on and take off.  Let people help if they offer. Home safety Keeping the home safe is very important to reduce the risk of falls and injuries.  Keep floors clear of clutter. Remove rugs, magazine racks, and floor lamps.  Keep hallways well lit.  Put a handrail and nonslip mat in the bathtub or shower.  Put childproof locks on cabinets with dangerous items, such as medicine, alcohol, guns, toxic cleaning items, sharp tools or utensils, matches, or lighters.  Place locks on doors where the person cannot easily see or reach them. This helps ensure that the person cannot wander out of the house and get lost.  Be prepared for emergencies. Keep a list of emergency phone numbers and addresses in a convenient area. Plans for the future  Do not put off talking about finances.  Talk about money management. People with Alzheimer disease have trouble managing their money as the disease gets worse.  Get help from professional advisors regarding financial and legal matters.  Do not put off talking about future care.  Choose a power of attorney. This is someone who can make decisions for the person with Alzheimer disease when he or she is no longer able to  do so.  Talk about driving and when it is the right time to stop. The person's health care provider can help give advice on this matter.  Talk about the person's living situation. If he or she lives alone, you need to make sure he or she is safe. Some people need extra help at home, and others need more care at a nursing home or care center. Support groups Joining a support  group can be very helpful for caregivers of people with Alzheimer disease. Some advantages to being part of a support group include:  Getting strategies to manage stress.  Sharing experiences with others.  Receiving emotional comfort and support.  Learning new caregiving skills as the disease progresses.  Knowing what community resources are available and taking advantage of them. Contact a health care provider if:  The person has a fever.  The person has a sudden change in behavior that does not improve with calming strategies.  The person is unable to manage in his or her current living situation.  The person threatens you or anyone else, including himself or herself.  You are no longer able to care for the person. This information is not intended to replace advice given to you by your health care provider. Make sure you discuss any questions you have with your health care provider. Document Released: 08/26/2004 Document Revised: 05/28/2016 Document Reviewed: 01/21/2012 Elsevier Interactive Patient Education  2017 [employer].

## 2017-04-02 NOTE — Progress Notes (Signed)
I agree with the assessment and plan as directed by NP .The patient is known to Dr Wyvonne Lenz, MD

## 2017-04-03 DIAGNOSIS — E222 Syndrome of inappropriate secretion of antidiuretic hormone: Secondary | ICD-10-CM | POA: Diagnosis not present

## 2017-04-03 DIAGNOSIS — I10 Essential (primary) hypertension: Secondary | ICD-10-CM | POA: Diagnosis not present

## 2017-04-03 DIAGNOSIS — M542 Cervicalgia: Secondary | ICD-10-CM | POA: Diagnosis not present

## 2017-04-07 DIAGNOSIS — R2681 Unsteadiness on feet: Secondary | ICD-10-CM | POA: Diagnosis not present

## 2017-04-07 DIAGNOSIS — R262 Difficulty in walking, not elsewhere classified: Secondary | ICD-10-CM | POA: Diagnosis not present

## 2017-04-08 DIAGNOSIS — R262 Difficulty in walking, not elsewhere classified: Secondary | ICD-10-CM | POA: Diagnosis not present

## 2017-04-08 DIAGNOSIS — R2681 Unsteadiness on feet: Secondary | ICD-10-CM | POA: Diagnosis not present

## 2017-04-10 DIAGNOSIS — R2681 Unsteadiness on feet: Secondary | ICD-10-CM | POA: Diagnosis not present

## 2017-04-10 DIAGNOSIS — R262 Difficulty in walking, not elsewhere classified: Secondary | ICD-10-CM | POA: Diagnosis not present

## 2017-04-14 DIAGNOSIS — R2681 Unsteadiness on feet: Secondary | ICD-10-CM | POA: Diagnosis not present

## 2017-04-14 DIAGNOSIS — R262 Difficulty in walking, not elsewhere classified: Secondary | ICD-10-CM | POA: Diagnosis not present

## 2017-04-17 DIAGNOSIS — R2681 Unsteadiness on feet: Secondary | ICD-10-CM | POA: Diagnosis not present

## 2017-04-17 DIAGNOSIS — R262 Difficulty in walking, not elsewhere classified: Secondary | ICD-10-CM | POA: Diagnosis not present

## 2017-04-20 DIAGNOSIS — R262 Difficulty in walking, not elsewhere classified: Secondary | ICD-10-CM | POA: Diagnosis not present

## 2017-04-20 DIAGNOSIS — R2681 Unsteadiness on feet: Secondary | ICD-10-CM | POA: Diagnosis not present

## 2017-04-22 DIAGNOSIS — R262 Difficulty in walking, not elsewhere classified: Secondary | ICD-10-CM | POA: Diagnosis not present

## 2017-04-22 DIAGNOSIS — R2681 Unsteadiness on feet: Secondary | ICD-10-CM | POA: Diagnosis not present

## 2017-04-23 DIAGNOSIS — R262 Difficulty in walking, not elsewhere classified: Secondary | ICD-10-CM | POA: Diagnosis not present

## 2017-04-23 DIAGNOSIS — R2681 Unsteadiness on feet: Secondary | ICD-10-CM | POA: Diagnosis not present

## 2017-04-28 DIAGNOSIS — R2681 Unsteadiness on feet: Secondary | ICD-10-CM | POA: Diagnosis not present

## 2017-04-28 DIAGNOSIS — R262 Difficulty in walking, not elsewhere classified: Secondary | ICD-10-CM | POA: Diagnosis not present

## 2017-05-05 DIAGNOSIS — R262 Difficulty in walking, not elsewhere classified: Secondary | ICD-10-CM | POA: Diagnosis not present

## 2017-05-05 DIAGNOSIS — R2681 Unsteadiness on feet: Secondary | ICD-10-CM | POA: Diagnosis not present

## 2017-05-06 DIAGNOSIS — R262 Difficulty in walking, not elsewhere classified: Secondary | ICD-10-CM | POA: Diagnosis not present

## 2017-05-06 DIAGNOSIS — R2681 Unsteadiness on feet: Secondary | ICD-10-CM | POA: Diagnosis not present

## 2017-05-08 DIAGNOSIS — I739 Peripheral vascular disease, unspecified: Secondary | ICD-10-CM | POA: Diagnosis not present

## 2017-05-08 DIAGNOSIS — L84 Corns and callosities: Secondary | ICD-10-CM | POA: Diagnosis not present

## 2017-05-08 DIAGNOSIS — R262 Difficulty in walking, not elsewhere classified: Secondary | ICD-10-CM | POA: Diagnosis not present

## 2017-05-08 DIAGNOSIS — E1151 Type 2 diabetes mellitus with diabetic peripheral angiopathy without gangrene: Secondary | ICD-10-CM | POA: Diagnosis not present

## 2017-05-08 DIAGNOSIS — R2681 Unsteadiness on feet: Secondary | ICD-10-CM | POA: Diagnosis not present

## 2017-05-08 DIAGNOSIS — L603 Nail dystrophy: Secondary | ICD-10-CM | POA: Diagnosis not present

## 2017-05-11 DIAGNOSIS — R262 Difficulty in walking, not elsewhere classified: Secondary | ICD-10-CM | POA: Diagnosis not present

## 2017-05-11 DIAGNOSIS — R2681 Unsteadiness on feet: Secondary | ICD-10-CM | POA: Diagnosis not present

## 2017-05-13 DIAGNOSIS — R2681 Unsteadiness on feet: Secondary | ICD-10-CM | POA: Diagnosis not present

## 2017-05-13 DIAGNOSIS — R262 Difficulty in walking, not elsewhere classified: Secondary | ICD-10-CM | POA: Diagnosis not present

## 2017-05-14 DIAGNOSIS — R262 Difficulty in walking, not elsewhere classified: Secondary | ICD-10-CM | POA: Diagnosis not present

## 2017-05-14 DIAGNOSIS — R2681 Unsteadiness on feet: Secondary | ICD-10-CM | POA: Diagnosis not present

## 2017-05-18 DIAGNOSIS — R262 Difficulty in walking, not elsewhere classified: Secondary | ICD-10-CM | POA: Diagnosis not present

## 2017-05-18 DIAGNOSIS — R2681 Unsteadiness on feet: Secondary | ICD-10-CM | POA: Diagnosis not present

## 2017-05-19 DIAGNOSIS — R2681 Unsteadiness on feet: Secondary | ICD-10-CM | POA: Diagnosis not present

## 2017-05-19 DIAGNOSIS — R262 Difficulty in walking, not elsewhere classified: Secondary | ICD-10-CM | POA: Diagnosis not present

## 2017-05-21 DIAGNOSIS — R262 Difficulty in walking, not elsewhere classified: Secondary | ICD-10-CM | POA: Diagnosis not present

## 2017-05-21 DIAGNOSIS — R2681 Unsteadiness on feet: Secondary | ICD-10-CM | POA: Diagnosis not present

## 2017-05-26 DIAGNOSIS — R262 Difficulty in walking, not elsewhere classified: Secondary | ICD-10-CM | POA: Diagnosis not present

## 2017-05-26 DIAGNOSIS — R2681 Unsteadiness on feet: Secondary | ICD-10-CM | POA: Diagnosis not present

## 2017-05-27 DIAGNOSIS — R2681 Unsteadiness on feet: Secondary | ICD-10-CM | POA: Diagnosis not present

## 2017-05-27 DIAGNOSIS — R262 Difficulty in walking, not elsewhere classified: Secondary | ICD-10-CM | POA: Diagnosis not present

## 2017-05-28 DIAGNOSIS — R2681 Unsteadiness on feet: Secondary | ICD-10-CM | POA: Diagnosis not present

## 2017-05-28 DIAGNOSIS — R262 Difficulty in walking, not elsewhere classified: Secondary | ICD-10-CM | POA: Diagnosis not present

## 2017-06-02 DIAGNOSIS — R262 Difficulty in walking, not elsewhere classified: Secondary | ICD-10-CM | POA: Diagnosis not present

## 2017-06-02 DIAGNOSIS — R2681 Unsteadiness on feet: Secondary | ICD-10-CM | POA: Diagnosis not present

## 2017-06-04 DIAGNOSIS — R262 Difficulty in walking, not elsewhere classified: Secondary | ICD-10-CM | POA: Diagnosis not present

## 2017-06-04 DIAGNOSIS — R2681 Unsteadiness on feet: Secondary | ICD-10-CM | POA: Diagnosis not present

## 2017-06-05 DIAGNOSIS — R262 Difficulty in walking, not elsewhere classified: Secondary | ICD-10-CM | POA: Diagnosis not present

## 2017-06-05 DIAGNOSIS — R2681 Unsteadiness on feet: Secondary | ICD-10-CM | POA: Diagnosis not present

## 2017-06-09 DIAGNOSIS — R2681 Unsteadiness on feet: Secondary | ICD-10-CM | POA: Diagnosis not present

## 2017-06-09 DIAGNOSIS — R262 Difficulty in walking, not elsewhere classified: Secondary | ICD-10-CM | POA: Diagnosis not present

## 2017-06-24 DIAGNOSIS — K644 Residual hemorrhoidal skin tags: Secondary | ICD-10-CM | POA: Diagnosis not present

## 2017-06-24 DIAGNOSIS — Z79899 Other long term (current) drug therapy: Secondary | ICD-10-CM | POA: Diagnosis not present

## 2017-06-24 DIAGNOSIS — E1140 Type 2 diabetes mellitus with diabetic neuropathy, unspecified: Secondary | ICD-10-CM | POA: Diagnosis not present

## 2017-06-24 DIAGNOSIS — E1165 Type 2 diabetes mellitus with hyperglycemia: Secondary | ICD-10-CM | POA: Diagnosis not present

## 2017-06-24 DIAGNOSIS — E222 Syndrome of inappropriate secretion of antidiuretic hormone: Secondary | ICD-10-CM | POA: Diagnosis not present

## 2017-06-24 DIAGNOSIS — F325 Major depressive disorder, single episode, in full remission: Secondary | ICD-10-CM | POA: Diagnosis not present

## 2017-06-24 DIAGNOSIS — Z0000 Encounter for general adult medical examination without abnormal findings: Secondary | ICD-10-CM | POA: Diagnosis not present

## 2017-06-24 DIAGNOSIS — G309 Alzheimer's disease, unspecified: Secondary | ICD-10-CM | POA: Diagnosis not present

## 2017-06-24 DIAGNOSIS — I10 Essential (primary) hypertension: Secondary | ICD-10-CM | POA: Diagnosis not present

## 2017-06-24 DIAGNOSIS — Z1389 Encounter for screening for other disorder: Secondary | ICD-10-CM | POA: Diagnosis not present

## 2017-06-24 DIAGNOSIS — J449 Chronic obstructive pulmonary disease, unspecified: Secondary | ICD-10-CM | POA: Diagnosis not present

## 2017-06-29 DIAGNOSIS — Z961 Presence of intraocular lens: Secondary | ICD-10-CM | POA: Diagnosis not present

## 2017-06-29 DIAGNOSIS — E113291 Type 2 diabetes mellitus with mild nonproliferative diabetic retinopathy without macular edema, right eye: Secondary | ICD-10-CM | POA: Diagnosis not present

## 2017-06-29 DIAGNOSIS — E119 Type 2 diabetes mellitus without complications: Secondary | ICD-10-CM | POA: Diagnosis not present

## 2017-06-29 DIAGNOSIS — H40013 Open angle with borderline findings, low risk, bilateral: Secondary | ICD-10-CM | POA: Diagnosis not present

## 2017-06-29 DIAGNOSIS — H35372 Puckering of macula, left eye: Secondary | ICD-10-CM | POA: Diagnosis not present

## 2017-07-08 DIAGNOSIS — Z79899 Other long term (current) drug therapy: Secondary | ICD-10-CM | POA: Diagnosis not present

## 2017-07-17 DIAGNOSIS — Z79899 Other long term (current) drug therapy: Secondary | ICD-10-CM | POA: Diagnosis not present

## 2017-08-10 DIAGNOSIS — I739 Peripheral vascular disease, unspecified: Secondary | ICD-10-CM | POA: Diagnosis not present

## 2017-08-10 DIAGNOSIS — E1151 Type 2 diabetes mellitus with diabetic peripheral angiopathy without gangrene: Secondary | ICD-10-CM | POA: Diagnosis not present

## 2017-08-10 DIAGNOSIS — L603 Nail dystrophy: Secondary | ICD-10-CM | POA: Diagnosis not present

## 2017-08-10 DIAGNOSIS — L84 Corns and callosities: Secondary | ICD-10-CM | POA: Diagnosis not present

## 2017-08-12 DIAGNOSIS — R011 Cardiac murmur, unspecified: Secondary | ICD-10-CM | POA: Diagnosis not present

## 2017-08-12 DIAGNOSIS — R600 Localized edema: Secondary | ICD-10-CM | POA: Diagnosis not present

## 2017-08-12 DIAGNOSIS — I10 Essential (primary) hypertension: Secondary | ICD-10-CM | POA: Diagnosis not present

## 2017-08-12 DIAGNOSIS — Z111 Encounter for screening for respiratory tuberculosis: Secondary | ICD-10-CM | POA: Diagnosis not present

## 2017-08-21 DIAGNOSIS — R41841 Cognitive communication deficit: Secondary | ICD-10-CM | POA: Diagnosis not present

## 2017-08-21 DIAGNOSIS — R2681 Unsteadiness on feet: Secondary | ICD-10-CM | POA: Diagnosis not present

## 2017-08-24 DIAGNOSIS — R41841 Cognitive communication deficit: Secondary | ICD-10-CM | POA: Diagnosis not present

## 2017-08-24 DIAGNOSIS — R2681 Unsteadiness on feet: Secondary | ICD-10-CM | POA: Diagnosis not present

## 2017-08-26 DIAGNOSIS — R41841 Cognitive communication deficit: Secondary | ICD-10-CM | POA: Diagnosis not present

## 2017-08-26 DIAGNOSIS — R2681 Unsteadiness on feet: Secondary | ICD-10-CM | POA: Diagnosis not present

## 2017-08-27 DIAGNOSIS — R41841 Cognitive communication deficit: Secondary | ICD-10-CM | POA: Diagnosis not present

## 2017-08-27 DIAGNOSIS — R2681 Unsteadiness on feet: Secondary | ICD-10-CM | POA: Diagnosis not present

## 2017-08-28 DIAGNOSIS — R2681 Unsteadiness on feet: Secondary | ICD-10-CM | POA: Diagnosis not present

## 2017-08-28 DIAGNOSIS — R41841 Cognitive communication deficit: Secondary | ICD-10-CM | POA: Diagnosis not present

## 2017-09-01 DIAGNOSIS — R41841 Cognitive communication deficit: Secondary | ICD-10-CM | POA: Diagnosis not present

## 2017-09-01 DIAGNOSIS — R2681 Unsteadiness on feet: Secondary | ICD-10-CM | POA: Diagnosis not present

## 2017-09-03 DIAGNOSIS — R2681 Unsteadiness on feet: Secondary | ICD-10-CM | POA: Diagnosis not present

## 2017-09-03 DIAGNOSIS — R41841 Cognitive communication deficit: Secondary | ICD-10-CM | POA: Diagnosis not present

## 2017-09-04 DIAGNOSIS — R2681 Unsteadiness on feet: Secondary | ICD-10-CM | POA: Diagnosis not present

## 2017-09-04 DIAGNOSIS — R41841 Cognitive communication deficit: Secondary | ICD-10-CM | POA: Diagnosis not present

## 2017-09-07 DIAGNOSIS — R41841 Cognitive communication deficit: Secondary | ICD-10-CM | POA: Diagnosis not present

## 2017-09-07 DIAGNOSIS — R2681 Unsteadiness on feet: Secondary | ICD-10-CM | POA: Diagnosis not present

## 2017-09-08 DIAGNOSIS — R41841 Cognitive communication deficit: Secondary | ICD-10-CM | POA: Diagnosis not present

## 2017-09-08 DIAGNOSIS — R2681 Unsteadiness on feet: Secondary | ICD-10-CM | POA: Diagnosis not present

## 2017-09-09 DIAGNOSIS — R2681 Unsteadiness on feet: Secondary | ICD-10-CM | POA: Diagnosis not present

## 2017-09-09 DIAGNOSIS — R41841 Cognitive communication deficit: Secondary | ICD-10-CM | POA: Diagnosis not present

## 2017-09-16 DIAGNOSIS — R41841 Cognitive communication deficit: Secondary | ICD-10-CM | POA: Diagnosis not present

## 2017-09-16 DIAGNOSIS — R2681 Unsteadiness on feet: Secondary | ICD-10-CM | POA: Diagnosis not present

## 2017-09-18 DIAGNOSIS — R2681 Unsteadiness on feet: Secondary | ICD-10-CM | POA: Diagnosis not present

## 2017-09-18 DIAGNOSIS — R41841 Cognitive communication deficit: Secondary | ICD-10-CM | POA: Diagnosis not present

## 2017-09-21 DIAGNOSIS — E113293 Type 2 diabetes mellitus with mild nonproliferative diabetic retinopathy without macular edema, bilateral: Secondary | ICD-10-CM | POA: Diagnosis not present

## 2017-09-21 DIAGNOSIS — E1140 Type 2 diabetes mellitus with diabetic neuropathy, unspecified: Secondary | ICD-10-CM | POA: Diagnosis not present

## 2017-09-21 DIAGNOSIS — E222 Syndrome of inappropriate secretion of antidiuretic hormone: Secondary | ICD-10-CM | POA: Diagnosis not present

## 2017-09-21 DIAGNOSIS — E1165 Type 2 diabetes mellitus with hyperglycemia: Secondary | ICD-10-CM | POA: Diagnosis not present

## 2017-09-21 DIAGNOSIS — R011 Cardiac murmur, unspecified: Secondary | ICD-10-CM | POA: Diagnosis not present

## 2017-09-21 DIAGNOSIS — I10 Essential (primary) hypertension: Secondary | ICD-10-CM | POA: Diagnosis not present

## 2017-09-21 DIAGNOSIS — Z23 Encounter for immunization: Secondary | ICD-10-CM | POA: Diagnosis not present

## 2017-09-21 DIAGNOSIS — Z79899 Other long term (current) drug therapy: Secondary | ICD-10-CM | POA: Diagnosis not present

## 2017-09-21 DIAGNOSIS — G309 Alzheimer's disease, unspecified: Secondary | ICD-10-CM | POA: Diagnosis not present

## 2017-09-21 DIAGNOSIS — J449 Chronic obstructive pulmonary disease, unspecified: Secondary | ICD-10-CM | POA: Diagnosis not present

## 2017-09-21 DIAGNOSIS — R41841 Cognitive communication deficit: Secondary | ICD-10-CM | POA: Diagnosis not present

## 2017-09-21 DIAGNOSIS — R2681 Unsteadiness on feet: Secondary | ICD-10-CM | POA: Diagnosis not present

## 2017-09-23 DIAGNOSIS — R2681 Unsteadiness on feet: Secondary | ICD-10-CM | POA: Diagnosis not present

## 2017-09-23 DIAGNOSIS — R41841 Cognitive communication deficit: Secondary | ICD-10-CM | POA: Diagnosis not present

## 2017-09-28 ENCOUNTER — Encounter (HOSPITAL_COMMUNITY): Payer: Self-pay | Admitting: *Deleted

## 2017-09-28 ENCOUNTER — Emergency Department (HOSPITAL_COMMUNITY): Payer: Medicare Other

## 2017-09-28 ENCOUNTER — Emergency Department (HOSPITAL_COMMUNITY)
Admission: EM | Admit: 2017-09-28 | Discharge: 2017-09-28 | Disposition: A | Discharge status: Home / Self Care | Payer: Medicare Other | Attending: Emergency Medicine | Admitting: Emergency Medicine

## 2017-09-28 DIAGNOSIS — Z79899 Other long term (current) drug therapy: Secondary | ICD-10-CM | POA: Diagnosis not present

## 2017-09-28 DIAGNOSIS — Z950 Presence of cardiac pacemaker: Secondary | ICD-10-CM | POA: Insufficient documentation

## 2017-09-28 DIAGNOSIS — Y999 Unspecified external cause status: Secondary | ICD-10-CM | POA: Insufficient documentation

## 2017-09-28 DIAGNOSIS — G4489 Other headache syndrome: Secondary | ICD-10-CM | POA: Diagnosis not present

## 2017-09-28 DIAGNOSIS — S0990XA Unspecified injury of head, initial encounter: Secondary | ICD-10-CM

## 2017-09-28 DIAGNOSIS — S0081XA Abrasion of other part of head, initial encounter: Secondary | ICD-10-CM | POA: Insufficient documentation

## 2017-09-28 DIAGNOSIS — J449 Chronic obstructive pulmonary disease, unspecified: Secondary | ICD-10-CM | POA: Diagnosis not present

## 2017-09-28 DIAGNOSIS — W01198A Fall on same level from slipping, tripping and stumbling with subsequent striking against other object, initial encounter: Secondary | ICD-10-CM | POA: Insufficient documentation

## 2017-09-28 DIAGNOSIS — Z87891 Personal history of nicotine dependence: Secondary | ICD-10-CM | POA: Diagnosis not present

## 2017-09-28 DIAGNOSIS — E1165 Type 2 diabetes mellitus with hyperglycemia: Secondary | ICD-10-CM | POA: Diagnosis not present

## 2017-09-28 DIAGNOSIS — S098XXA Other specified injuries of head, initial encounter: Secondary | ICD-10-CM | POA: Diagnosis not present

## 2017-09-28 DIAGNOSIS — R2681 Unsteadiness on feet: Secondary | ICD-10-CM | POA: Diagnosis not present

## 2017-09-28 DIAGNOSIS — S0091XA Abrasion of unspecified part of head, initial encounter: Secondary | ICD-10-CM | POA: Diagnosis not present

## 2017-09-28 DIAGNOSIS — G309 Alzheimer's disease, unspecified: Secondary | ICD-10-CM | POA: Insufficient documentation

## 2017-09-28 DIAGNOSIS — N189 Chronic kidney disease, unspecified: Secondary | ICD-10-CM | POA: Insufficient documentation

## 2017-09-28 DIAGNOSIS — Y929 Unspecified place or not applicable: Secondary | ICD-10-CM | POA: Insufficient documentation

## 2017-09-28 DIAGNOSIS — T148XXA Other injury of unspecified body region, initial encounter: Secondary | ICD-10-CM

## 2017-09-28 DIAGNOSIS — R41841 Cognitive communication deficit: Secondary | ICD-10-CM | POA: Diagnosis not present

## 2017-09-28 DIAGNOSIS — Z7984 Long term (current) use of oral hypoglycemic drugs: Secondary | ICD-10-CM | POA: Diagnosis not present

## 2017-09-28 DIAGNOSIS — Y9389 Activity, other specified: Secondary | ICD-10-CM | POA: Diagnosis not present

## 2017-09-28 DIAGNOSIS — Z9181 History of falling: Secondary | ICD-10-CM | POA: Diagnosis not present

## 2017-09-28 MED ORDER — BACITRACIN ZINC 500 UNIT/GM EX OINT
TOPICAL_OINTMENT | Freq: Two times a day (BID) | CUTANEOUS | Status: DC
  Administered 2017-09-28: 1 via TOPICAL
  Filled 2017-09-28: qty 0.9

## 2017-09-28 MED ORDER — ACETAMINOPHEN 325 MG PO TABS
650.0000 mg | ORAL_TABLET | Freq: Once | ORAL | Status: AC
  Administered 2017-09-28: 650 mg via ORAL
  Filled 2017-09-28: qty 2

## 2017-09-28 MED ORDER — BACITRACIN ZINC 500 UNIT/GM EX OINT: 1.0000 "application " | TOPICAL_OINTMENT | Freq: Two times a day (BID) | CUTANEOUS | 0 refills | Status: AC

## 2017-09-28 NOTE — ED Provider Notes (Signed)
[facility] DEPT Provider Note   CSN: [id number] Arrival date & time: 09/28/17  1022     History   Chief Complaint Chief Complaint  Patient presents with  . Fall  . Head Injury    HPI Melissa Fischer is a 83 y.o. female.  HPI 83 year old Caucasian female past medical history significant for Alzheimer's disease, COPD, depression, diabetes that presents to the emergency department today from nursing facility with daughter at bedside for mechanical fall. The patient states that she was in the bathroom when she fell. Unknown reason why she fell but feels like she tripped over her feet. Daughter states that she was near her depends and felt like she was trying to lean over to pick things up when she fell. Patient uses a walker at baseline. Patient complains of some intermittent mild head pain and right-sided facial pain. Also reports a skin tear to the right forearm. Denies any blood thinner use. Status post tetanus shot is up-to-date. Patient has been ambulatory since the event. Patient denies any LOC. Patient was assisted up soon after the fall. She denies any extremity pain, chest pain, abdominal pain, lightheadedness, dizziness, back pain, neck pain. Past Medical History:  Diagnosis Date  . Alzheimer disease   . Bronchitis   . COPD (chronic obstructive pulmonary disease) ([place])   . Depression   . Diabetes ([place])   . Hypercholesteremia   . Memory loss   . Pacemaker    Medtronic DOI 2011  . Peripheral neuropathy   . Renal disease   . Shoulder pain, left   . Syncope     Patient Active Problem List   Diagnosis Date Noted  . Gait abnormality 04/02/2017  . SIADH (syndrome of inappropriate ADH production) ([place]) 02/06/2017  . Encephalopathy acute 02/01/2017  . Hyponatremia 01/31/2017  . Acute encephalopathy 01/31/2017  . Generalized weakness   . Influenza A 01/25/2017  . Hip fracture ([place]) 04/26/2016  . Type 2 diabetes mellitus with hyperglycemia ([place]) 04/26/2016  . COPD  (chronic obstructive pulmonary disease) ([place]) 04/26/2016  . Closed right hip fracture ([place]) 04/26/2016  . Subcapital fracture of right femur ([place])   . Alzheimer's dementia with behavioral disturbance 03/31/2016  . DM (diabetes mellitus) type II controlled, neurological manifestation ([place]) 10/30/2015  . Depression 10/30/2015  . CKD (chronic kidney disease) 10/30/2015    Past Surgical History:  Procedure Laterality Date  . HIP PINNING,CANNULATED Right 04/26/2016   Procedure: CANNULATED HIP PINNING;  Surgeon: Tania Ade, MD;  Location: [facility];  Service: Orthopedics;  Laterality: Right;  . no surgical history    . PACEMAKER PLACEMENT      OB History    No data available       Home Medications    Prior to Admission medications   Medication Sig Start Date End Date Taking? Authorizing Provider  alendronate (FOSAMAX) 70 MG tablet Take 70 mg by mouth every Monday. Take with a full glass of water on an empty stomach.   Yes [provider]  calcium-vitamin D (OSCAL WITH D) 250-125 MG-UNIT tablet Take 1 tablet by mouth daily.    Yes [provider]  fexofenadine (ALLEGRA) 180 MG tablet Take 180 mg by mouth daily.    Yes [provider]  Fluticasone-Salmeterol (ADVAIR) 250-50 MCG/DOSE AEPB Inhale 1 puff into the lungs 2 (two) times daily.   Yes [provider]  labetalol (NORMODYNE) 100 MG tablet Take 1 tablet (100 mg total) by mouth 3 (three) times daily. 02/06/17  Yes Short, Noah Delaine,  MD  metFORMIN (GLUCOPHAGE-XR) 500 MG 24 hr tablet Take 500-1,000 mg by mouth 2 (two) times daily. Take two tablets in the morning and one tablet in the evening. 01/23/16  Yes [provider]  methocarbamol (ROBAXIN) 500 MG tablet Take 250 mg by mouth 2 (two) times daily. 1/2 tab 9, 12, 3, 6, full tab 9 pm 03/25/17  Yes [provider]  bacitracin ointment Apply 1 application topically 2 (two) times daily. 09/28/17 10/12/17  Duffy Bruce, MD  feeding  supplement, GLUCERNA SHAKE, (GLUCERNA SHAKE) LIQD Take 237 mLs by mouth 2 (two) times daily between meals. Patient not taking: Reported on 09/28/2017 02/06/17   Janece Canterbury, MD  polyethylene glycol [facility] / GLYCOLAX) packet Take 17 g by mouth daily as needed for mild constipation. Patient not taking: Reported on 04/02/2017 02/06/17   Janece Canterbury, MD    Family History Family History  Problem Relation Age of Onset  . Diabetes Mother   . Dementia Neg Hx     Social History Social History  Substance Use Topics  . Smoking status: Former Smoker    Quit date: 12/29/1974  . Smokeless tobacco: Never Used  . Alcohol use 0.0 oz/week     Comment: 1 drinks per day (10/29/15)     Allergies   Aricept [donepezil]; Exelon [rivastigmine]; Namenda [memantine]; Penicillins; Statins; Sulfa antibiotics; and Wellbutrin [bupropion]   Review of Systems Review of Systems  Constitutional: Negative for chills and fever.  Eyes: Negative for visual disturbance.  Respiratory: Negative for shortness of breath.   Cardiovascular: Negative for chest pain.  Gastrointestinal: Negative for abdominal pain, nausea and vomiting.  Musculoskeletal: Negative for back pain.  Skin: Positive for wound. Negative for color change.  Neurological: Positive for headaches. Negative for dizziness, syncope, weakness and light-headedness.     Physical Exam Updated Vital Signs BP (!) 148/91 (BP Location: Right Arm)   Pulse 61   Temp 98.4 F (36.9 C) (Oral)   Resp 18   SpO2 100%   Physical Exam Physical Exam  Constitutional: Pt is oriented to person, place, and time. Appears well-developed and well-nourished. No distress.  HENT:  Head: Normocephalic and atraumatic.  Ears: No bilateral hemotympanum. Nose: Nose normal. No septal hematoma. Mouth/Throat: Uvula is midline, oropharynx is clear and moist and mucous membranes are normal.  Eyes: Conjunctivae and EOM are normal. Pupils are equal, round, and reactive to  light.  Neck: No spinous process tenderness and no muscular tenderness present. No rigidity. Normal range of motion present.  Full ROM without pain No midline cervical tenderness No crepitus, deformity or step-offs  No paraspinal tenderness  Cardiovascular: Normal rate, regular rhythm and intact distal pulses.   Pulses:      Radial pulses are 2+ on the right side, and 2+ on the left side.       Dorsalis pedis pulses are 2+ on the right side, and 2+ on the left side.       Posterior tibial pulses are 2+ on the right side, and 2+ on the left side.  Pulmonary/Chest: Effort normal and breath sounds normal. No accessory muscle usage. No respiratory distress. No decreased breath sounds. No wheezes. No rhonchi. No rales. Exhibits no tenderness and no bony tenderness.  No flail segment, crepitus or deformity Equal chest expansion  Abdominal: Soft. Normal appearance and bowel sounds are normal. There is no tenderness. There is no rigidity, no guarding and no CVA tenderness.  Abd soft and nontender  Musculoskeletal: Normal range of motion.  Thoracic back: Exhibits normal range of motion.       Lumbar back: Exhibits normal range of motion.  Full range of motion of the T-spine and L-spine No tenderness to palpation of the spinous processes of the T-spine or L-spine No crepitus, deformity or step-offs No tenderness to palpation of the paraspinous muscles of the L-spine Pelvis is stable.  Moving all 4 extremities with out difficulty.   Lymphadenopathy:    Pt has no cervical adenopathy.  Neurological: Pt is alert and oriented to person, place, and time. Normal reflexes. No cranial nerve deficit. GCS eye subscore is 4. GCS verbal subscore is 5. GCS motor subscore is 6.  Reflex Scores:      Bicep reflexes are 2+ on the right side and 2+ on the left side.      Brachioradialis reflexes are 2+ on the right side and 2+ on the left side.      Patellar reflexes are 2+ on the right side and 2+ on the  left side.      Achilles reflexes are 2+ on the right side and 2+ on the left side. Speech is clear and goal oriented, follows commands Normal 5/5 strength in upper and lower extremities bilaterally including dorsiflexion and plantar flexion, strong and equal grip strength Sensation normal to light and sharp touch Moves extremities without ataxia, coordination intact No Clonus  Skin: Skin is warm and dry. No rash noted. Pt is not diaphoretic. Pt with 2 small skin tears to the right forearm with bleeding controlled. Moves right forearm joints without difficulties.  No erythema.  Psychiatric: Normal mood and affect.  Nursing note and vitals reviewed.     ED Treatments / Results  Labs (all labs ordered are listed, but only abnormal results are displayed) Labs Reviewed - No data to display  EKG  EKG Interpretation None       Radiology No results found.  Procedures Procedures (including critical care time)  Medications Ordered in ED Medications  bacitracin ointment (1 application Topical Given 09/28/17 1246)  acetaminophen (TYLENOL) tablet 650 mg (650 mg Oral Given 09/28/17 1246)     Initial Impression / Assessment and Plan / ED Course  I have reviewed the triage vital signs and the nursing notes.  Pertinent labs & imaging results that were available during my care of the patient were reviewed by me and considered in my medical decision making (see chart for details).     Patient presents to the ED after mechanical fall without any LOC. No signs of intracranial, intrathoracic, intra-abdominal trauma. Patient moving all 4 extremities without any difficulties. Has been ambulatory. Doubt pelvic fracture. Skin abrasions are not able to be sutured. They were cleaned and dressed. No focal neuro deficits on exam. Neurovascularly intact in all extremities. Ordered imaging of head, face, and neck. Family would not like to stay for imaging. They feel that pt is ok. They dicussed with Dr.  Myrene Buddy about being discharged. Pt remains hemodynamically stable. Discussed with family return precautions.  Pt is hemodynamically stable, in NAD, & able to ambulate in the ED. Evaluation does not show pathology that would require ongoing emergent intervention or inpatient treatment. I explained the diagnosis to the patient. Pain has been managed & has no complaints prior to dc. Pt is comfortable with above plan and is stable for discharge at this time. All questions were answered prior to disposition. Strict return precautions for f/u to the ED were discussed. Encouraged follow up with PCP.  Pt  seen and eval by my attending Dr. Ellender Hose who is agreeable to the above plan.   Final Clinical Impressions(s) / ED Diagnoses   Final diagnoses:  Injury of head, initial encounter  Abrasion    New Prescriptions Discharge Medication List as of 09/28/2017  1:49 PM    START taking these medications   Details  bacitracin ointment Apply 1 application topically 2 (two) times daily., Starting Mon 09/28/2017, Until Mon 10/12/2017, Print         Doristine Devoid, PA-C 09/28/17 1528    Duffy Bruce, MD 09/29/17 [phone]

## 2017-09-28 NOTE — ED Notes (Signed)
Bed: WA08 Expected date:  Expected time:  Means of arrival:  Comments: 

## 2017-09-28 NOTE — ED Triage Notes (Addendum)
Per EMS, pt from [facility] sent for fall in shower this morning. Pt complains of head pain and right sided facial pain. Pt has skin tear to right forearm. Pt is not on blood thinners. Pt has hx of dementia. Pt was ambulatory with walker for EMS.   BP 130/100 HR 62 O2 98% CBG 197

## 2017-09-30 DIAGNOSIS — R41841 Cognitive communication deficit: Secondary | ICD-10-CM | POA: Diagnosis not present

## 2017-09-30 DIAGNOSIS — R2681 Unsteadiness on feet: Secondary | ICD-10-CM | POA: Diagnosis not present

## 2017-09-30 DIAGNOSIS — Z9181 History of falling: Secondary | ICD-10-CM | POA: Diagnosis not present

## 2017-10-02 DIAGNOSIS — R41841 Cognitive communication deficit: Secondary | ICD-10-CM | POA: Diagnosis not present

## 2017-10-02 DIAGNOSIS — R2681 Unsteadiness on feet: Secondary | ICD-10-CM | POA: Diagnosis not present

## 2017-10-02 DIAGNOSIS — Z9181 History of falling: Secondary | ICD-10-CM | POA: Diagnosis not present

## 2017-10-05 DIAGNOSIS — J449 Chronic obstructive pulmonary disease, unspecified: Secondary | ICD-10-CM | POA: Diagnosis not present

## 2017-10-05 DIAGNOSIS — E1165 Type 2 diabetes mellitus with hyperglycemia: Secondary | ICD-10-CM | POA: Diagnosis not present

## 2017-10-05 DIAGNOSIS — G309 Alzheimer's disease, unspecified: Secondary | ICD-10-CM | POA: Diagnosis not present

## 2017-10-05 DIAGNOSIS — F325 Major depressive disorder, single episode, in full remission: Secondary | ICD-10-CM | POA: Diagnosis not present

## 2017-10-05 DIAGNOSIS — I10 Essential (primary) hypertension: Secondary | ICD-10-CM | POA: Diagnosis not present

## 2017-10-05 DIAGNOSIS — M810 Age-related osteoporosis without current pathological fracture: Secondary | ICD-10-CM | POA: Diagnosis not present

## 2017-10-05 DIAGNOSIS — E1140 Type 2 diabetes mellitus with diabetic neuropathy, unspecified: Secondary | ICD-10-CM | POA: Diagnosis not present

## 2017-10-07 DIAGNOSIS — R2681 Unsteadiness on feet: Secondary | ICD-10-CM | POA: Diagnosis not present

## 2017-10-07 DIAGNOSIS — Z9181 History of falling: Secondary | ICD-10-CM | POA: Diagnosis not present

## 2017-10-07 DIAGNOSIS — R41841 Cognitive communication deficit: Secondary | ICD-10-CM | POA: Diagnosis not present

## 2017-10-08 DIAGNOSIS — Z9181 History of falling: Secondary | ICD-10-CM | POA: Diagnosis not present

## 2017-10-08 DIAGNOSIS — R2681 Unsteadiness on feet: Secondary | ICD-10-CM | POA: Diagnosis not present

## 2017-10-08 DIAGNOSIS — R41841 Cognitive communication deficit: Secondary | ICD-10-CM | POA: Diagnosis not present

## 2017-10-09 DIAGNOSIS — R41841 Cognitive communication deficit: Secondary | ICD-10-CM | POA: Diagnosis not present

## 2017-10-09 DIAGNOSIS — R2681 Unsteadiness on feet: Secondary | ICD-10-CM | POA: Diagnosis not present

## 2017-10-09 DIAGNOSIS — Z9181 History of falling: Secondary | ICD-10-CM | POA: Diagnosis not present

## 2017-10-12 ENCOUNTER — Other Ambulatory Visit: Payer: Self-pay | Admitting: Geriatric Medicine

## 2017-10-12 DIAGNOSIS — R011 Cardiac murmur, unspecified: Secondary | ICD-10-CM

## 2017-10-12 DIAGNOSIS — R41841 Cognitive communication deficit: Secondary | ICD-10-CM | POA: Diagnosis not present

## 2017-10-12 DIAGNOSIS — Z9181 History of falling: Secondary | ICD-10-CM | POA: Diagnosis not present

## 2017-10-12 DIAGNOSIS — R2681 Unsteadiness on feet: Secondary | ICD-10-CM | POA: Diagnosis not present

## 2017-10-13 ENCOUNTER — Ambulatory Visit (HOSPITAL_COMMUNITY): Payer: Medicare Other | Attending: Cardiovascular Disease

## 2017-10-13 ENCOUNTER — Other Ambulatory Visit: Payer: Self-pay

## 2017-10-13 DIAGNOSIS — I352 Nonrheumatic aortic (valve) stenosis with insufficiency: Secondary | ICD-10-CM | POA: Insufficient documentation

## 2017-10-13 DIAGNOSIS — R011 Cardiac murmur, unspecified: Secondary | ICD-10-CM | POA: Diagnosis not present

## 2017-10-13 DIAGNOSIS — R2681 Unsteadiness on feet: Secondary | ICD-10-CM | POA: Diagnosis not present

## 2017-10-13 DIAGNOSIS — Z9181 History of falling: Secondary | ICD-10-CM | POA: Diagnosis not present

## 2017-10-13 DIAGNOSIS — R41841 Cognitive communication deficit: Secondary | ICD-10-CM | POA: Diagnosis not present

## 2017-10-16 DIAGNOSIS — Z9181 History of falling: Secondary | ICD-10-CM | POA: Diagnosis not present

## 2017-10-16 DIAGNOSIS — R41841 Cognitive communication deficit: Secondary | ICD-10-CM | POA: Diagnosis not present

## 2017-10-16 DIAGNOSIS — R2681 Unsteadiness on feet: Secondary | ICD-10-CM | POA: Diagnosis not present

## 2017-10-20 DIAGNOSIS — R41841 Cognitive communication deficit: Secondary | ICD-10-CM | POA: Diagnosis not present

## 2017-10-20 DIAGNOSIS — R2681 Unsteadiness on feet: Secondary | ICD-10-CM | POA: Diagnosis not present

## 2017-10-20 DIAGNOSIS — Z9181 History of falling: Secondary | ICD-10-CM | POA: Diagnosis not present

## 2017-10-21 DIAGNOSIS — Z9181 History of falling: Secondary | ICD-10-CM | POA: Diagnosis not present

## 2017-10-21 DIAGNOSIS — R41841 Cognitive communication deficit: Secondary | ICD-10-CM | POA: Diagnosis not present

## 2017-10-21 DIAGNOSIS — R2681 Unsteadiness on feet: Secondary | ICD-10-CM | POA: Diagnosis not present

## 2017-10-22 DIAGNOSIS — R2681 Unsteadiness on feet: Secondary | ICD-10-CM | POA: Diagnosis not present

## 2017-10-22 DIAGNOSIS — Z9181 History of falling: Secondary | ICD-10-CM | POA: Diagnosis not present

## 2017-10-22 DIAGNOSIS — R41841 Cognitive communication deficit: Secondary | ICD-10-CM | POA: Diagnosis not present

## 2017-10-23 DIAGNOSIS — R05 Cough: Secondary | ICD-10-CM | POA: Diagnosis not present

## 2017-10-23 DIAGNOSIS — H1131 Conjunctival hemorrhage, right eye: Secondary | ICD-10-CM | POA: Diagnosis not present

## 2017-10-27 DIAGNOSIS — Z9181 History of falling: Secondary | ICD-10-CM | POA: Diagnosis not present

## 2017-10-27 DIAGNOSIS — R2681 Unsteadiness on feet: Secondary | ICD-10-CM | POA: Diagnosis not present

## 2017-10-27 DIAGNOSIS — R41841 Cognitive communication deficit: Secondary | ICD-10-CM | POA: Diagnosis not present

## 2017-10-28 DIAGNOSIS — R2681 Unsteadiness on feet: Secondary | ICD-10-CM | POA: Diagnosis not present

## 2017-10-28 DIAGNOSIS — Z9181 History of falling: Secondary | ICD-10-CM | POA: Diagnosis not present

## 2017-10-28 DIAGNOSIS — R41841 Cognitive communication deficit: Secondary | ICD-10-CM | POA: Diagnosis not present

## 2017-11-02 DIAGNOSIS — Z9181 History of falling: Secondary | ICD-10-CM | POA: Diagnosis not present

## 2017-11-02 DIAGNOSIS — R2681 Unsteadiness on feet: Secondary | ICD-10-CM | POA: Diagnosis not present

## 2017-11-03 DIAGNOSIS — Z9181 History of falling: Secondary | ICD-10-CM | POA: Diagnosis not present

## 2017-11-03 DIAGNOSIS — R2681 Unsteadiness on feet: Secondary | ICD-10-CM | POA: Diagnosis not present

## 2017-11-06 DIAGNOSIS — Z9181 History of falling: Secondary | ICD-10-CM | POA: Diagnosis not present

## 2017-11-06 DIAGNOSIS — R2681 Unsteadiness on feet: Secondary | ICD-10-CM | POA: Diagnosis not present

## 2017-11-09 DIAGNOSIS — E1151 Type 2 diabetes mellitus with diabetic peripheral angiopathy without gangrene: Secondary | ICD-10-CM | POA: Diagnosis not present

## 2017-11-09 DIAGNOSIS — L84 Corns and callosities: Secondary | ICD-10-CM | POA: Diagnosis not present

## 2017-11-09 DIAGNOSIS — L603 Nail dystrophy: Secondary | ICD-10-CM | POA: Diagnosis not present

## 2017-11-09 DIAGNOSIS — I739 Peripheral vascular disease, unspecified: Secondary | ICD-10-CM | POA: Diagnosis not present

## 2017-11-10 DIAGNOSIS — R2681 Unsteadiness on feet: Secondary | ICD-10-CM | POA: Diagnosis not present

## 2017-11-10 DIAGNOSIS — Z9181 History of falling: Secondary | ICD-10-CM | POA: Diagnosis not present

## 2017-11-11 DIAGNOSIS — R2681 Unsteadiness on feet: Secondary | ICD-10-CM | POA: Diagnosis not present

## 2017-11-11 DIAGNOSIS — Z9181 History of falling: Secondary | ICD-10-CM | POA: Diagnosis not present

## 2017-11-17 DIAGNOSIS — R2681 Unsteadiness on feet: Secondary | ICD-10-CM | POA: Diagnosis not present

## 2017-11-17 DIAGNOSIS — Z9181 History of falling: Secondary | ICD-10-CM | POA: Diagnosis not present

## 2017-11-20 DIAGNOSIS — Z9181 History of falling: Secondary | ICD-10-CM | POA: Diagnosis not present

## 2017-11-20 DIAGNOSIS — R2681 Unsteadiness on feet: Secondary | ICD-10-CM | POA: Diagnosis not present

## 2017-11-23 DIAGNOSIS — Z9181 History of falling: Secondary | ICD-10-CM | POA: Diagnosis not present

## 2017-11-23 DIAGNOSIS — R2681 Unsteadiness on feet: Secondary | ICD-10-CM | POA: Diagnosis not present

## 2017-11-25 DIAGNOSIS — Z9181 History of falling: Secondary | ICD-10-CM | POA: Diagnosis not present

## 2017-11-25 DIAGNOSIS — R2681 Unsteadiness on feet: Secondary | ICD-10-CM | POA: Diagnosis not present

## 2017-12-02 ENCOUNTER — Ambulatory Visit (INDEPENDENT_AMBULATORY_CARE_PROVIDER_SITE_OTHER): Payer: Medicare Other | Admitting: Neurology

## 2017-12-02 ENCOUNTER — Encounter: Payer: Self-pay | Admitting: Neurology

## 2017-12-02 VITALS — BP 106/60 | HR 76 | Ht 67.25 in | Wt 164.0 lb

## 2017-12-02 DIAGNOSIS — G301 Alzheimer's disease with late onset: Secondary | ICD-10-CM | POA: Diagnosis not present

## 2017-12-02 DIAGNOSIS — F0280 Dementia in other diseases classified elsewhere without behavioral disturbance: Secondary | ICD-10-CM | POA: Diagnosis not present
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
## 2017-12-03 DIAGNOSIS — G309 Alzheimer's disease, unspecified: Secondary | ICD-10-CM

## 2017-12-03 DIAGNOSIS — F0280 Dementia in other diseases classified elsewhere without behavioral disturbance: Secondary | ICD-10-CM | POA: Insufficient documentation

## 2017-12-03 DIAGNOSIS — R2681 Unsteadiness on feet: Secondary | ICD-10-CM | POA: Diagnosis not present

## 2017-12-03 DIAGNOSIS — Z9181 History of falling: Secondary | ICD-10-CM | POA: Diagnosis not present

## 2017-12-04 DIAGNOSIS — R2681 Unsteadiness on feet: Secondary | ICD-10-CM | POA: Diagnosis not present

## 2017-12-04 DIAGNOSIS — Z9181 History of falling: Secondary | ICD-10-CM | POA: Diagnosis not present

## 2017-12-31 DIAGNOSIS — H40013 Open angle with borderline findings, low risk, bilateral: Secondary | ICD-10-CM | POA: Diagnosis not present

## 2018-01-05 DIAGNOSIS — R5383 Other fatigue: Secondary | ICD-10-CM | POA: Diagnosis not present

## 2018-01-06 DIAGNOSIS — R41841 Cognitive communication deficit: Secondary | ICD-10-CM | POA: Diagnosis not present

## 2018-01-06 DIAGNOSIS — R41840 Attention and concentration deficit: Secondary | ICD-10-CM | POA: Diagnosis not present

## 2018-01-06 DIAGNOSIS — M6281 Muscle weakness (generalized): Secondary | ICD-10-CM | POA: Diagnosis not present

## 2018-01-06 DIAGNOSIS — N393 Stress incontinence (female) (male): Secondary | ICD-10-CM | POA: Diagnosis not present

## 2018-01-06 DIAGNOSIS — R2681 Unsteadiness on feet: Secondary | ICD-10-CM | POA: Diagnosis not present

## 2018-01-08 DIAGNOSIS — R41841 Cognitive communication deficit: Secondary | ICD-10-CM | POA: Diagnosis not present

## 2018-01-08 DIAGNOSIS — R41840 Attention and concentration deficit: Secondary | ICD-10-CM | POA: Diagnosis not present

## 2018-01-08 DIAGNOSIS — R2681 Unsteadiness on feet: Secondary | ICD-10-CM | POA: Diagnosis not present

## 2018-01-08 DIAGNOSIS — M6281 Muscle weakness (generalized): Secondary | ICD-10-CM | POA: Diagnosis not present

## 2018-01-08 DIAGNOSIS — N393 Stress incontinence (female) (male): Secondary | ICD-10-CM | POA: Diagnosis not present

## 2018-01-11 DIAGNOSIS — R41841 Cognitive communication deficit: Secondary | ICD-10-CM | POA: Diagnosis not present

## 2018-01-11 DIAGNOSIS — R41840 Attention and concentration deficit: Secondary | ICD-10-CM | POA: Diagnosis not present

## 2018-01-11 DIAGNOSIS — N393 Stress incontinence (female) (male): Secondary | ICD-10-CM | POA: Diagnosis not present

## 2018-01-11 DIAGNOSIS — M6281 Muscle weakness (generalized): Secondary | ICD-10-CM | POA: Diagnosis not present

## 2018-01-11 DIAGNOSIS — R2681 Unsteadiness on feet: Secondary | ICD-10-CM | POA: Diagnosis not present

## 2018-01-13 DIAGNOSIS — M6281 Muscle weakness (generalized): Secondary | ICD-10-CM | POA: Diagnosis not present

## 2018-01-13 DIAGNOSIS — R41841 Cognitive communication deficit: Secondary | ICD-10-CM | POA: Diagnosis not present

## 2018-01-13 DIAGNOSIS — R2681 Unsteadiness on feet: Secondary | ICD-10-CM | POA: Diagnosis not present

## 2018-01-13 DIAGNOSIS — N393 Stress incontinence (female) (male): Secondary | ICD-10-CM | POA: Diagnosis not present

## 2018-01-13 DIAGNOSIS — R41840 Attention and concentration deficit: Secondary | ICD-10-CM | POA: Diagnosis not present

## 2018-01-14 DIAGNOSIS — R2681 Unsteadiness on feet: Secondary | ICD-10-CM | POA: Diagnosis not present

## 2018-01-14 DIAGNOSIS — R41841 Cognitive communication deficit: Secondary | ICD-10-CM | POA: Diagnosis not present

## 2018-01-14 DIAGNOSIS — N393 Stress incontinence (female) (male): Secondary | ICD-10-CM | POA: Diagnosis not present

## 2018-01-14 DIAGNOSIS — M6281 Muscle weakness (generalized): Secondary | ICD-10-CM | POA: Diagnosis not present

## 2018-01-14 DIAGNOSIS — R41840 Attention and concentration deficit: Secondary | ICD-10-CM | POA: Diagnosis not present

## 2018-01-19 DIAGNOSIS — R41841 Cognitive communication deficit: Secondary | ICD-10-CM | POA: Diagnosis not present

## 2018-01-19 DIAGNOSIS — N393 Stress incontinence (female) (male): Secondary | ICD-10-CM | POA: Diagnosis not present

## 2018-01-19 DIAGNOSIS — R2681 Unsteadiness on feet: Secondary | ICD-10-CM | POA: Diagnosis not present

## 2018-01-19 DIAGNOSIS — M6281 Muscle weakness (generalized): Secondary | ICD-10-CM | POA: Diagnosis not present

## 2018-01-19 DIAGNOSIS — R41840 Attention and concentration deficit: Secondary | ICD-10-CM | POA: Diagnosis not present

## 2018-01-20 DIAGNOSIS — Z79899 Other long term (current) drug therapy: Secondary | ICD-10-CM | POA: Diagnosis not present

## 2018-01-20 DIAGNOSIS — R2681 Unsteadiness on feet: Secondary | ICD-10-CM | POA: Diagnosis not present

## 2018-01-20 DIAGNOSIS — G309 Alzheimer's disease, unspecified: Secondary | ICD-10-CM | POA: Diagnosis not present

## 2018-01-20 DIAGNOSIS — R410 Disorientation, unspecified: Secondary | ICD-10-CM | POA: Diagnosis not present

## 2018-01-20 DIAGNOSIS — M6281 Muscle weakness (generalized): Secondary | ICD-10-CM | POA: Diagnosis not present

## 2018-01-20 DIAGNOSIS — E222 Syndrome of inappropriate secretion of antidiuretic hormone: Secondary | ICD-10-CM | POA: Diagnosis not present

## 2018-01-20 DIAGNOSIS — N393 Stress incontinence (female) (male): Secondary | ICD-10-CM | POA: Diagnosis not present

## 2018-01-20 DIAGNOSIS — E1140 Type 2 diabetes mellitus with diabetic neuropathy, unspecified: Secondary | ICD-10-CM | POA: Diagnosis not present

## 2018-01-20 DIAGNOSIS — I10 Essential (primary) hypertension: Secondary | ICD-10-CM | POA: Diagnosis not present

## 2018-01-20 DIAGNOSIS — R0609 Other forms of dyspnea: Secondary | ICD-10-CM | POA: Diagnosis not present

## 2018-01-20 DIAGNOSIS — E1165 Type 2 diabetes mellitus with hyperglycemia: Secondary | ICD-10-CM | POA: Diagnosis not present

## 2018-01-20 DIAGNOSIS — R41841 Cognitive communication deficit: Secondary | ICD-10-CM | POA: Diagnosis not present

## 2018-01-20 DIAGNOSIS — R41840 Attention and concentration deficit: Secondary | ICD-10-CM | POA: Diagnosis not present

## 2018-01-20 DIAGNOSIS — Z7984 Long term (current) use of oral hypoglycemic drugs: Secondary | ICD-10-CM | POA: Diagnosis not present

## 2018-01-20 DIAGNOSIS — R350 Frequency of micturition: Secondary | ICD-10-CM | POA: Diagnosis not present

## 2018-01-21 DIAGNOSIS — R41841 Cognitive communication deficit: Secondary | ICD-10-CM | POA: Diagnosis not present

## 2018-01-21 DIAGNOSIS — R2681 Unsteadiness on feet: Secondary | ICD-10-CM | POA: Diagnosis not present

## 2018-01-21 DIAGNOSIS — M6281 Muscle weakness (generalized): Secondary | ICD-10-CM | POA: Diagnosis not present

## 2018-01-21 DIAGNOSIS — N393 Stress incontinence (female) (male): Secondary | ICD-10-CM | POA: Diagnosis not present

## 2018-01-21 DIAGNOSIS — R41840 Attention and concentration deficit: Secondary | ICD-10-CM | POA: Diagnosis not present

## 2018-01-22 DIAGNOSIS — R41841 Cognitive communication deficit: Secondary | ICD-10-CM | POA: Diagnosis not present

## 2018-01-22 DIAGNOSIS — M6281 Muscle weakness (generalized): Secondary | ICD-10-CM | POA: Diagnosis not present

## 2018-01-22 DIAGNOSIS — N393 Stress incontinence (female) (male): Secondary | ICD-10-CM | POA: Diagnosis not present

## 2018-01-22 DIAGNOSIS — R2681 Unsteadiness on feet: Secondary | ICD-10-CM | POA: Diagnosis not present

## 2018-01-22 DIAGNOSIS — R41840 Attention and concentration deficit: Secondary | ICD-10-CM | POA: Diagnosis not present

## 2018-01-25 DIAGNOSIS — M6281 Muscle weakness (generalized): Secondary | ICD-10-CM | POA: Diagnosis not present

## 2018-01-25 DIAGNOSIS — N393 Stress incontinence (female) (male): Secondary | ICD-10-CM | POA: Diagnosis not present

## 2018-01-25 DIAGNOSIS — R41841 Cognitive communication deficit: Secondary | ICD-10-CM | POA: Diagnosis not present

## 2018-01-25 DIAGNOSIS — R2681 Unsteadiness on feet: Secondary | ICD-10-CM | POA: Diagnosis not present

## 2018-01-25 DIAGNOSIS — R41840 Attention and concentration deficit: Secondary | ICD-10-CM | POA: Diagnosis not present

## 2018-01-26 DIAGNOSIS — M6281 Muscle weakness (generalized): Secondary | ICD-10-CM | POA: Diagnosis not present

## 2018-01-26 DIAGNOSIS — N393 Stress incontinence (female) (male): Secondary | ICD-10-CM | POA: Diagnosis not present

## 2018-01-26 DIAGNOSIS — R41840 Attention and concentration deficit: Secondary | ICD-10-CM | POA: Diagnosis not present

## 2018-01-26 DIAGNOSIS — R41841 Cognitive communication deficit: Secondary | ICD-10-CM | POA: Diagnosis not present

## 2018-01-26 DIAGNOSIS — R2681 Unsteadiness on feet: Secondary | ICD-10-CM | POA: Diagnosis not present

## 2018-01-27 DIAGNOSIS — R41840 Attention and concentration deficit: Secondary | ICD-10-CM | POA: Diagnosis not present

## 2018-01-27 DIAGNOSIS — R41841 Cognitive communication deficit: Secondary | ICD-10-CM | POA: Diagnosis not present

## 2018-01-27 DIAGNOSIS — M6281 Muscle weakness (generalized): Secondary | ICD-10-CM | POA: Diagnosis not present

## 2018-01-27 DIAGNOSIS — N393 Stress incontinence (female) (male): Secondary | ICD-10-CM | POA: Diagnosis not present

## 2018-01-27 DIAGNOSIS — R2681 Unsteadiness on feet: Secondary | ICD-10-CM | POA: Diagnosis not present

## 2018-01-28 DIAGNOSIS — R41841 Cognitive communication deficit: Secondary | ICD-10-CM | POA: Diagnosis not present

## 2018-01-28 DIAGNOSIS — R2681 Unsteadiness on feet: Secondary | ICD-10-CM | POA: Diagnosis not present

## 2018-01-28 DIAGNOSIS — N393 Stress incontinence (female) (male): Secondary | ICD-10-CM | POA: Diagnosis not present

## 2018-01-28 DIAGNOSIS — M6281 Muscle weakness (generalized): Secondary | ICD-10-CM | POA: Diagnosis not present

## 2018-01-28 DIAGNOSIS — R41840 Attention and concentration deficit: Secondary | ICD-10-CM | POA: Diagnosis not present

## 2018-02-01 DIAGNOSIS — N393 Stress incontinence (female) (male): Secondary | ICD-10-CM | POA: Diagnosis not present

## 2018-02-01 DIAGNOSIS — R41841 Cognitive communication deficit: Secondary | ICD-10-CM | POA: Diagnosis not present

## 2018-02-01 DIAGNOSIS — M6281 Muscle weakness (generalized): Secondary | ICD-10-CM | POA: Diagnosis not present

## 2018-02-01 DIAGNOSIS — R41840 Attention and concentration deficit: Secondary | ICD-10-CM | POA: Diagnosis not present

## 2018-02-01 DIAGNOSIS — R2681 Unsteadiness on feet: Secondary | ICD-10-CM | POA: Diagnosis not present

## 2018-02-02 DIAGNOSIS — M6281 Muscle weakness (generalized): Secondary | ICD-10-CM | POA: Diagnosis not present

## 2018-02-02 DIAGNOSIS — R41840 Attention and concentration deficit: Secondary | ICD-10-CM | POA: Diagnosis not present

## 2018-02-02 DIAGNOSIS — N393 Stress incontinence (female) (male): Secondary | ICD-10-CM | POA: Diagnosis not present

## 2018-02-02 DIAGNOSIS — R41841 Cognitive communication deficit: Secondary | ICD-10-CM | POA: Diagnosis not present

## 2018-02-02 DIAGNOSIS — R2681 Unsteadiness on feet: Secondary | ICD-10-CM | POA: Diagnosis not present

## 2018-02-03 DIAGNOSIS — M6281 Muscle weakness (generalized): Secondary | ICD-10-CM | POA: Diagnosis not present

## 2018-02-03 DIAGNOSIS — R41840 Attention and concentration deficit: Secondary | ICD-10-CM | POA: Diagnosis not present

## 2018-02-03 DIAGNOSIS — N393 Stress incontinence (female) (male): Secondary | ICD-10-CM | POA: Diagnosis not present

## 2018-02-03 DIAGNOSIS — R2681 Unsteadiness on feet: Secondary | ICD-10-CM | POA: Diagnosis not present

## 2018-02-03 DIAGNOSIS — R41841 Cognitive communication deficit: Secondary | ICD-10-CM | POA: Diagnosis not present

## 2018-02-04 DIAGNOSIS — R2681 Unsteadiness on feet: Secondary | ICD-10-CM | POA: Diagnosis not present

## 2018-02-04 DIAGNOSIS — M6281 Muscle weakness (generalized): Secondary | ICD-10-CM | POA: Diagnosis not present

## 2018-02-04 DIAGNOSIS — N393 Stress incontinence (female) (male): Secondary | ICD-10-CM | POA: Diagnosis not present

## 2018-02-04 DIAGNOSIS — R41841 Cognitive communication deficit: Secondary | ICD-10-CM | POA: Diagnosis not present

## 2018-02-04 DIAGNOSIS — R41840 Attention and concentration deficit: Secondary | ICD-10-CM | POA: Diagnosis not present

## 2018-02-05 DIAGNOSIS — R2681 Unsteadiness on feet: Secondary | ICD-10-CM | POA: Diagnosis not present

## 2018-02-05 DIAGNOSIS — R41841 Cognitive communication deficit: Secondary | ICD-10-CM | POA: Diagnosis not present

## 2018-02-05 DIAGNOSIS — R41840 Attention and concentration deficit: Secondary | ICD-10-CM | POA: Diagnosis not present

## 2018-02-05 DIAGNOSIS — M4316 Spondylolisthesis, lumbar region: Secondary | ICD-10-CM | POA: Diagnosis not present

## 2018-02-05 DIAGNOSIS — N393 Stress incontinence (female) (male): Secondary | ICD-10-CM | POA: Diagnosis not present

## 2018-02-05 DIAGNOSIS — S0990XA Unspecified injury of head, initial encounter: Secondary | ICD-10-CM | POA: Diagnosis not present

## 2018-02-05 DIAGNOSIS — M6281 Muscle weakness (generalized): Secondary | ICD-10-CM | POA: Diagnosis not present

## 2018-02-05 DIAGNOSIS — M533 Sacrococcygeal disorders, not elsewhere classified: Secondary | ICD-10-CM | POA: Diagnosis not present

## 2018-02-05 DIAGNOSIS — M5137 Other intervertebral disc degeneration, lumbosacral region: Secondary | ICD-10-CM | POA: Diagnosis not present

## 2018-02-05 DIAGNOSIS — R42 Dizziness and giddiness: Secondary | ICD-10-CM | POA: Diagnosis not present

## 2018-02-05 DIAGNOSIS — R269 Unspecified abnormalities of gait and mobility: Secondary | ICD-10-CM | POA: Diagnosis not present

## 2018-02-08 DIAGNOSIS — N393 Stress incontinence (female) (male): Secondary | ICD-10-CM | POA: Diagnosis not present

## 2018-02-08 DIAGNOSIS — M6281 Muscle weakness (generalized): Secondary | ICD-10-CM | POA: Diagnosis not present

## 2018-02-08 DIAGNOSIS — R41840 Attention and concentration deficit: Secondary | ICD-10-CM | POA: Diagnosis not present

## 2018-02-08 DIAGNOSIS — R41841 Cognitive communication deficit: Secondary | ICD-10-CM | POA: Diagnosis not present

## 2018-02-08 DIAGNOSIS — R2681 Unsteadiness on feet: Secondary | ICD-10-CM | POA: Diagnosis not present

## 2018-02-09 DIAGNOSIS — R41841 Cognitive communication deficit: Secondary | ICD-10-CM | POA: Diagnosis not present

## 2018-02-09 DIAGNOSIS — R41840 Attention and concentration deficit: Secondary | ICD-10-CM | POA: Diagnosis not present

## 2018-02-09 DIAGNOSIS — R2681 Unsteadiness on feet: Secondary | ICD-10-CM | POA: Diagnosis not present

## 2018-02-09 DIAGNOSIS — N393 Stress incontinence (female) (male): Secondary | ICD-10-CM | POA: Diagnosis not present

## 2018-02-09 DIAGNOSIS — M6281 Muscle weakness (generalized): Secondary | ICD-10-CM | POA: Diagnosis not present

## 2018-02-10 DIAGNOSIS — M6281 Muscle weakness (generalized): Secondary | ICD-10-CM | POA: Diagnosis not present

## 2018-02-10 DIAGNOSIS — R41841 Cognitive communication deficit: Secondary | ICD-10-CM | POA: Diagnosis not present

## 2018-02-10 DIAGNOSIS — R41840 Attention and concentration deficit: Secondary | ICD-10-CM | POA: Diagnosis not present

## 2018-02-10 DIAGNOSIS — R2681 Unsteadiness on feet: Secondary | ICD-10-CM | POA: Diagnosis not present

## 2018-02-10 DIAGNOSIS — N393 Stress incontinence (female) (male): Secondary | ICD-10-CM | POA: Diagnosis not present

## 2018-02-11 DIAGNOSIS — R41840 Attention and concentration deficit: Secondary | ICD-10-CM | POA: Diagnosis not present

## 2018-02-11 DIAGNOSIS — M6281 Muscle weakness (generalized): Secondary | ICD-10-CM | POA: Diagnosis not present

## 2018-02-11 DIAGNOSIS — N393 Stress incontinence (female) (male): Secondary | ICD-10-CM | POA: Diagnosis not present

## 2018-02-11 DIAGNOSIS — R41841 Cognitive communication deficit: Secondary | ICD-10-CM | POA: Diagnosis not present

## 2018-02-11 DIAGNOSIS — R2681 Unsteadiness on feet: Secondary | ICD-10-CM | POA: Diagnosis not present

## 2018-02-16 DIAGNOSIS — N393 Stress incontinence (female) (male): Secondary | ICD-10-CM | POA: Diagnosis not present

## 2018-02-16 DIAGNOSIS — M6281 Muscle weakness (generalized): Secondary | ICD-10-CM | POA: Diagnosis not present

## 2018-02-16 DIAGNOSIS — R2681 Unsteadiness on feet: Secondary | ICD-10-CM | POA: Diagnosis not present

## 2018-02-16 DIAGNOSIS — R41841 Cognitive communication deficit: Secondary | ICD-10-CM | POA: Diagnosis not present

## 2018-02-16 DIAGNOSIS — R41840 Attention and concentration deficit: Secondary | ICD-10-CM | POA: Diagnosis not present

## 2018-02-17 DIAGNOSIS — R2681 Unsteadiness on feet: Secondary | ICD-10-CM | POA: Diagnosis not present

## 2018-02-17 DIAGNOSIS — N393 Stress incontinence (female) (male): Secondary | ICD-10-CM | POA: Diagnosis not present

## 2018-02-17 DIAGNOSIS — R41841 Cognitive communication deficit: Secondary | ICD-10-CM | POA: Diagnosis not present

## 2018-02-17 DIAGNOSIS — M6281 Muscle weakness (generalized): Secondary | ICD-10-CM | POA: Diagnosis not present

## 2018-02-17 DIAGNOSIS — R41840 Attention and concentration deficit: Secondary | ICD-10-CM | POA: Diagnosis not present

## 2018-02-22 DIAGNOSIS — R41841 Cognitive communication deficit: Secondary | ICD-10-CM | POA: Diagnosis not present

## 2018-02-22 DIAGNOSIS — R41840 Attention and concentration deficit: Secondary | ICD-10-CM | POA: Diagnosis not present

## 2018-02-22 DIAGNOSIS — N393 Stress incontinence (female) (male): Secondary | ICD-10-CM | POA: Diagnosis not present

## 2018-02-22 DIAGNOSIS — M6281 Muscle weakness (generalized): Secondary | ICD-10-CM | POA: Diagnosis not present

## 2018-02-22 DIAGNOSIS — R2681 Unsteadiness on feet: Secondary | ICD-10-CM | POA: Diagnosis not present

## 2018-02-24 DIAGNOSIS — R41841 Cognitive communication deficit: Secondary | ICD-10-CM | POA: Diagnosis not present

## 2018-02-24 DIAGNOSIS — M6281 Muscle weakness (generalized): Secondary | ICD-10-CM | POA: Diagnosis not present

## 2018-02-24 DIAGNOSIS — R41840 Attention and concentration deficit: Secondary | ICD-10-CM | POA: Diagnosis not present

## 2018-02-24 DIAGNOSIS — N393 Stress incontinence (female) (male): Secondary | ICD-10-CM | POA: Diagnosis not present

## 2018-02-24 DIAGNOSIS — R2681 Unsteadiness on feet: Secondary | ICD-10-CM | POA: Diagnosis not present

## 2018-02-25 DIAGNOSIS — N393 Stress incontinence (female) (male): Secondary | ICD-10-CM | POA: Diagnosis not present

## 2018-02-25 DIAGNOSIS — R41841 Cognitive communication deficit: Secondary | ICD-10-CM | POA: Diagnosis not present

## 2018-02-25 DIAGNOSIS — M6281 Muscle weakness (generalized): Secondary | ICD-10-CM | POA: Diagnosis not present

## 2018-02-25 DIAGNOSIS — R2681 Unsteadiness on feet: Secondary | ICD-10-CM | POA: Diagnosis not present

## 2018-02-25 DIAGNOSIS — R41840 Attention and concentration deficit: Secondary | ICD-10-CM | POA: Diagnosis not present

## 2018-02-26 DIAGNOSIS — N393 Stress incontinence (female) (male): Secondary | ICD-10-CM | POA: Diagnosis not present

## 2018-02-26 DIAGNOSIS — R41840 Attention and concentration deficit: Secondary | ICD-10-CM | POA: Diagnosis not present

## 2018-02-26 DIAGNOSIS — R41841 Cognitive communication deficit: Secondary | ICD-10-CM | POA: Diagnosis not present

## 2018-02-26 DIAGNOSIS — R2681 Unsteadiness on feet: Secondary | ICD-10-CM | POA: Diagnosis not present

## 2018-02-26 DIAGNOSIS — M6281 Muscle weakness (generalized): Secondary | ICD-10-CM | POA: Diagnosis not present

## 2018-03-01 DIAGNOSIS — R41841 Cognitive communication deficit: Secondary | ICD-10-CM | POA: Diagnosis not present

## 2018-03-01 DIAGNOSIS — R41840 Attention and concentration deficit: Secondary | ICD-10-CM | POA: Diagnosis not present

## 2018-03-01 DIAGNOSIS — M6281 Muscle weakness (generalized): Secondary | ICD-10-CM | POA: Diagnosis not present

## 2018-03-01 DIAGNOSIS — N393 Stress incontinence (female) (male): Secondary | ICD-10-CM | POA: Diagnosis not present

## 2018-03-01 DIAGNOSIS — R2681 Unsteadiness on feet: Secondary | ICD-10-CM | POA: Diagnosis not present

## 2018-03-02 DIAGNOSIS — R41841 Cognitive communication deficit: Secondary | ICD-10-CM | POA: Diagnosis not present

## 2018-03-02 DIAGNOSIS — M6281 Muscle weakness (generalized): Secondary | ICD-10-CM | POA: Diagnosis not present

## 2018-03-02 DIAGNOSIS — R2681 Unsteadiness on feet: Secondary | ICD-10-CM | POA: Diagnosis not present

## 2018-03-02 DIAGNOSIS — N393 Stress incontinence (female) (male): Secondary | ICD-10-CM | POA: Diagnosis not present

## 2018-03-02 DIAGNOSIS — R41840 Attention and concentration deficit: Secondary | ICD-10-CM | POA: Diagnosis not present

## 2018-03-03 DIAGNOSIS — N393 Stress incontinence (female) (male): Secondary | ICD-10-CM | POA: Diagnosis not present

## 2018-03-03 DIAGNOSIS — R41841 Cognitive communication deficit: Secondary | ICD-10-CM | POA: Diagnosis not present

## 2018-03-03 DIAGNOSIS — R41840 Attention and concentration deficit: Secondary | ICD-10-CM | POA: Diagnosis not present

## 2018-03-03 DIAGNOSIS — M6281 Muscle weakness (generalized): Secondary | ICD-10-CM | POA: Diagnosis not present

## 2018-03-03 DIAGNOSIS — R2681 Unsteadiness on feet: Secondary | ICD-10-CM | POA: Diagnosis not present

## 2018-03-05 DIAGNOSIS — R41841 Cognitive communication deficit: Secondary | ICD-10-CM | POA: Diagnosis not present

## 2018-03-05 DIAGNOSIS — M6281 Muscle weakness (generalized): Secondary | ICD-10-CM | POA: Diagnosis not present

## 2018-03-05 DIAGNOSIS — R41840 Attention and concentration deficit: Secondary | ICD-10-CM | POA: Diagnosis not present

## 2018-03-05 DIAGNOSIS — R2681 Unsteadiness on feet: Secondary | ICD-10-CM | POA: Diagnosis not present

## 2018-03-05 DIAGNOSIS — N393 Stress incontinence (female) (male): Secondary | ICD-10-CM | POA: Diagnosis not present

## 2018-03-08 DIAGNOSIS — M6281 Muscle weakness (generalized): Secondary | ICD-10-CM | POA: Diagnosis not present

## 2018-03-08 DIAGNOSIS — R2681 Unsteadiness on feet: Secondary | ICD-10-CM | POA: Diagnosis not present

## 2018-03-08 DIAGNOSIS — R41841 Cognitive communication deficit: Secondary | ICD-10-CM | POA: Diagnosis not present

## 2018-03-08 DIAGNOSIS — R41840 Attention and concentration deficit: Secondary | ICD-10-CM | POA: Diagnosis not present

## 2018-03-08 DIAGNOSIS — N393 Stress incontinence (female) (male): Secondary | ICD-10-CM | POA: Diagnosis not present

## 2018-03-09 DIAGNOSIS — R2681 Unsteadiness on feet: Secondary | ICD-10-CM | POA: Diagnosis not present

## 2018-03-09 DIAGNOSIS — M6281 Muscle weakness (generalized): Secondary | ICD-10-CM | POA: Diagnosis not present

## 2018-03-09 DIAGNOSIS — R41841 Cognitive communication deficit: Secondary | ICD-10-CM | POA: Diagnosis not present

## 2018-03-09 DIAGNOSIS — R41840 Attention and concentration deficit: Secondary | ICD-10-CM | POA: Diagnosis not present

## 2018-03-09 DIAGNOSIS — N393 Stress incontinence (female) (male): Secondary | ICD-10-CM | POA: Diagnosis not present

## 2018-03-10 DIAGNOSIS — N393 Stress incontinence (female) (male): Secondary | ICD-10-CM | POA: Diagnosis not present

## 2018-03-10 DIAGNOSIS — R2681 Unsteadiness on feet: Secondary | ICD-10-CM | POA: Diagnosis not present

## 2018-03-10 DIAGNOSIS — R41840 Attention and concentration deficit: Secondary | ICD-10-CM | POA: Diagnosis not present

## 2018-03-10 DIAGNOSIS — M6281 Muscle weakness (generalized): Secondary | ICD-10-CM | POA: Diagnosis not present

## 2018-03-10 DIAGNOSIS — R41841 Cognitive communication deficit: Secondary | ICD-10-CM | POA: Diagnosis not present

## 2018-03-15 DIAGNOSIS — R41840 Attention and concentration deficit: Secondary | ICD-10-CM | POA: Diagnosis not present

## 2018-03-15 DIAGNOSIS — N393 Stress incontinence (female) (male): Secondary | ICD-10-CM | POA: Diagnosis not present

## 2018-03-15 DIAGNOSIS — R41841 Cognitive communication deficit: Secondary | ICD-10-CM | POA: Diagnosis not present

## 2018-03-15 DIAGNOSIS — M6281 Muscle weakness (generalized): Secondary | ICD-10-CM | POA: Diagnosis not present

## 2018-03-15 DIAGNOSIS — R2681 Unsteadiness on feet: Secondary | ICD-10-CM | POA: Diagnosis not present

## 2018-03-22 DIAGNOSIS — Z7984 Long term (current) use of oral hypoglycemic drugs: Secondary | ICD-10-CM | POA: Diagnosis not present

## 2018-03-22 DIAGNOSIS — E1140 Type 2 diabetes mellitus with diabetic neuropathy, unspecified: Secondary | ICD-10-CM | POA: Diagnosis not present

## 2018-03-22 DIAGNOSIS — I10 Essential (primary) hypertension: Secondary | ICD-10-CM | POA: Diagnosis not present

## 2018-03-22 DIAGNOSIS — G309 Alzheimer's disease, unspecified: Secondary | ICD-10-CM | POA: Diagnosis not present

## 2018-03-22 DIAGNOSIS — Z79899 Other long term (current) drug therapy: Secondary | ICD-10-CM | POA: Diagnosis not present

## 2018-05-26 DIAGNOSIS — E222 Syndrome of inappropriate secretion of antidiuretic hormone: Secondary | ICD-10-CM | POA: Diagnosis not present

## 2018-05-26 DIAGNOSIS — R5383 Other fatigue: Secondary | ICD-10-CM | POA: Diagnosis not present

## 2018-05-26 DIAGNOSIS — Z7984 Long term (current) use of oral hypoglycemic drugs: Secondary | ICD-10-CM | POA: Diagnosis not present

## 2018-05-26 DIAGNOSIS — E119 Type 2 diabetes mellitus without complications: Secondary | ICD-10-CM | POA: Diagnosis not present

## 2018-05-26 DIAGNOSIS — R41841 Cognitive communication deficit: Secondary | ICD-10-CM | POA: Diagnosis not present

## 2018-05-26 DIAGNOSIS — N3000 Acute cystitis without hematuria: Secondary | ICD-10-CM | POA: Diagnosis not present

## 2018-05-26 DIAGNOSIS — H1033 Unspecified acute conjunctivitis, bilateral: Secondary | ICD-10-CM | POA: Diagnosis not present

## 2018-05-26 DIAGNOSIS — R8290 Unspecified abnormal findings in urine: Secondary | ICD-10-CM | POA: Diagnosis not present

## 2018-05-28 DIAGNOSIS — R41841 Cognitive communication deficit: Secondary | ICD-10-CM | POA: Diagnosis not present

## 2018-05-31 DIAGNOSIS — M6281 Muscle weakness (generalized): Secondary | ICD-10-CM | POA: Diagnosis not present

## 2018-05-31 DIAGNOSIS — R2681 Unsteadiness on feet: Secondary | ICD-10-CM | POA: Diagnosis not present

## 2018-05-31 DIAGNOSIS — R488 Other symbolic dysfunctions: Secondary | ICD-10-CM | POA: Diagnosis not present

## 2018-05-31 DIAGNOSIS — R278 Other lack of coordination: Secondary | ICD-10-CM | POA: Diagnosis not present

## 2018-05-31 DIAGNOSIS — R41841 Cognitive communication deficit: Secondary | ICD-10-CM | POA: Diagnosis not present

## 2018-06-01 DIAGNOSIS — R278 Other lack of coordination: Secondary | ICD-10-CM | POA: Diagnosis not present

## 2018-06-01 DIAGNOSIS — R2681 Unsteadiness on feet: Secondary | ICD-10-CM | POA: Diagnosis not present

## 2018-06-01 DIAGNOSIS — R41841 Cognitive communication deficit: Secondary | ICD-10-CM | POA: Diagnosis not present

## 2018-06-01 DIAGNOSIS — M6281 Muscle weakness (generalized): Secondary | ICD-10-CM | POA: Diagnosis not present

## 2018-06-01 DIAGNOSIS — R488 Other symbolic dysfunctions: Secondary | ICD-10-CM | POA: Diagnosis not present

## 2018-06-02 DIAGNOSIS — M6281 Muscle weakness (generalized): Secondary | ICD-10-CM | POA: Diagnosis not present

## 2018-06-02 DIAGNOSIS — R2681 Unsteadiness on feet: Secondary | ICD-10-CM | POA: Diagnosis not present

## 2018-06-02 DIAGNOSIS — R488 Other symbolic dysfunctions: Secondary | ICD-10-CM | POA: Diagnosis not present

## 2018-06-02 DIAGNOSIS — R278 Other lack of coordination: Secondary | ICD-10-CM | POA: Diagnosis not present

## 2018-06-02 DIAGNOSIS — R41841 Cognitive communication deficit: Secondary | ICD-10-CM | POA: Diagnosis not present

## 2018-06-03 DIAGNOSIS — R488 Other symbolic dysfunctions: Secondary | ICD-10-CM | POA: Diagnosis not present

## 2018-06-03 DIAGNOSIS — M6281 Muscle weakness (generalized): Secondary | ICD-10-CM | POA: Diagnosis not present

## 2018-06-03 DIAGNOSIS — R2681 Unsteadiness on feet: Secondary | ICD-10-CM | POA: Diagnosis not present

## 2018-06-03 DIAGNOSIS — R278 Other lack of coordination: Secondary | ICD-10-CM | POA: Diagnosis not present

## 2018-06-03 DIAGNOSIS — R41841 Cognitive communication deficit: Secondary | ICD-10-CM | POA: Diagnosis not present

## 2018-06-04 DIAGNOSIS — R278 Other lack of coordination: Secondary | ICD-10-CM | POA: Diagnosis not present

## 2018-06-04 DIAGNOSIS — M6281 Muscle weakness (generalized): Secondary | ICD-10-CM | POA: Diagnosis not present

## 2018-06-04 DIAGNOSIS — R2681 Unsteadiness on feet: Secondary | ICD-10-CM | POA: Diagnosis not present

## 2018-06-04 DIAGNOSIS — R41841 Cognitive communication deficit: Secondary | ICD-10-CM | POA: Diagnosis not present

## 2018-06-04 DIAGNOSIS — R488 Other symbolic dysfunctions: Secondary | ICD-10-CM | POA: Diagnosis not present

## 2018-06-07 DIAGNOSIS — M6281 Muscle weakness (generalized): Secondary | ICD-10-CM | POA: Diagnosis not present

## 2018-06-07 DIAGNOSIS — R278 Other lack of coordination: Secondary | ICD-10-CM | POA: Diagnosis not present

## 2018-06-07 DIAGNOSIS — R41841 Cognitive communication deficit: Secondary | ICD-10-CM | POA: Diagnosis not present

## 2018-06-07 DIAGNOSIS — R488 Other symbolic dysfunctions: Secondary | ICD-10-CM | POA: Diagnosis not present

## 2018-06-07 DIAGNOSIS — R2681 Unsteadiness on feet: Secondary | ICD-10-CM | POA: Diagnosis not present

## 2018-06-08 DIAGNOSIS — R2681 Unsteadiness on feet: Secondary | ICD-10-CM | POA: Diagnosis not present

## 2018-06-08 DIAGNOSIS — R278 Other lack of coordination: Secondary | ICD-10-CM | POA: Diagnosis not present

## 2018-06-08 DIAGNOSIS — R41841 Cognitive communication deficit: Secondary | ICD-10-CM | POA: Diagnosis not present

## 2018-06-08 DIAGNOSIS — R488 Other symbolic dysfunctions: Secondary | ICD-10-CM | POA: Diagnosis not present

## 2018-06-08 DIAGNOSIS — M6281 Muscle weakness (generalized): Secondary | ICD-10-CM | POA: Diagnosis not present

## 2018-06-09 DIAGNOSIS — R488 Other symbolic dysfunctions: Secondary | ICD-10-CM | POA: Diagnosis not present

## 2018-06-09 DIAGNOSIS — R41841 Cognitive communication deficit: Secondary | ICD-10-CM | POA: Diagnosis not present

## 2018-06-09 DIAGNOSIS — M6281 Muscle weakness (generalized): Secondary | ICD-10-CM | POA: Diagnosis not present

## 2018-06-09 DIAGNOSIS — R278 Other lack of coordination: Secondary | ICD-10-CM | POA: Diagnosis not present

## 2018-06-09 DIAGNOSIS — R2681 Unsteadiness on feet: Secondary | ICD-10-CM | POA: Diagnosis not present

## 2018-06-10 DIAGNOSIS — R488 Other symbolic dysfunctions: Secondary | ICD-10-CM | POA: Diagnosis not present

## 2018-06-10 DIAGNOSIS — R2681 Unsteadiness on feet: Secondary | ICD-10-CM | POA: Diagnosis not present

## 2018-06-10 DIAGNOSIS — M6281 Muscle weakness (generalized): Secondary | ICD-10-CM | POA: Diagnosis not present

## 2018-06-10 DIAGNOSIS — R278 Other lack of coordination: Secondary | ICD-10-CM | POA: Diagnosis not present

## 2018-06-10 DIAGNOSIS — R41841 Cognitive communication deficit: Secondary | ICD-10-CM | POA: Diagnosis not present

## 2018-06-14 DIAGNOSIS — R278 Other lack of coordination: Secondary | ICD-10-CM | POA: Diagnosis not present

## 2018-06-14 DIAGNOSIS — M6281 Muscle weakness (generalized): Secondary | ICD-10-CM | POA: Diagnosis not present

## 2018-06-14 DIAGNOSIS — R488 Other symbolic dysfunctions: Secondary | ICD-10-CM | POA: Diagnosis not present

## 2018-06-14 DIAGNOSIS — R41841 Cognitive communication deficit: Secondary | ICD-10-CM | POA: Diagnosis not present

## 2018-06-14 DIAGNOSIS — R2681 Unsteadiness on feet: Secondary | ICD-10-CM | POA: Diagnosis not present

## 2018-06-15 DIAGNOSIS — M6281 Muscle weakness (generalized): Secondary | ICD-10-CM | POA: Diagnosis not present

## 2018-06-15 DIAGNOSIS — R41841 Cognitive communication deficit: Secondary | ICD-10-CM | POA: Diagnosis not present

## 2018-06-15 DIAGNOSIS — R488 Other symbolic dysfunctions: Secondary | ICD-10-CM | POA: Diagnosis not present

## 2018-06-15 DIAGNOSIS — R2681 Unsteadiness on feet: Secondary | ICD-10-CM | POA: Diagnosis not present

## 2018-06-15 DIAGNOSIS — R278 Other lack of coordination: Secondary | ICD-10-CM | POA: Diagnosis not present

## 2018-06-16 DIAGNOSIS — R278 Other lack of coordination: Secondary | ICD-10-CM | POA: Diagnosis not present

## 2018-06-16 DIAGNOSIS — M6281 Muscle weakness (generalized): Secondary | ICD-10-CM | POA: Diagnosis not present

## 2018-06-16 DIAGNOSIS — R2681 Unsteadiness on feet: Secondary | ICD-10-CM | POA: Diagnosis not present

## 2018-06-16 DIAGNOSIS — R41841 Cognitive communication deficit: Secondary | ICD-10-CM | POA: Diagnosis not present

## 2018-06-16 DIAGNOSIS — R488 Other symbolic dysfunctions: Secondary | ICD-10-CM | POA: Diagnosis not present

## 2018-06-17 DIAGNOSIS — M6281 Muscle weakness (generalized): Secondary | ICD-10-CM | POA: Diagnosis not present

## 2018-06-17 DIAGNOSIS — R2681 Unsteadiness on feet: Secondary | ICD-10-CM | POA: Diagnosis not present

## 2018-06-17 DIAGNOSIS — R488 Other symbolic dysfunctions: Secondary | ICD-10-CM | POA: Diagnosis not present

## 2018-06-17 DIAGNOSIS — R41841 Cognitive communication deficit: Secondary | ICD-10-CM | POA: Diagnosis not present

## 2018-06-17 DIAGNOSIS — R278 Other lack of coordination: Secondary | ICD-10-CM | POA: Diagnosis not present

## 2018-06-18 DIAGNOSIS — R278 Other lack of coordination: Secondary | ICD-10-CM | POA: Diagnosis not present

## 2018-06-18 DIAGNOSIS — M6281 Muscle weakness (generalized): Secondary | ICD-10-CM | POA: Diagnosis not present

## 2018-06-18 DIAGNOSIS — R2681 Unsteadiness on feet: Secondary | ICD-10-CM | POA: Diagnosis not present

## 2018-06-18 DIAGNOSIS — R488 Other symbolic dysfunctions: Secondary | ICD-10-CM | POA: Diagnosis not present

## 2018-06-18 DIAGNOSIS — R41841 Cognitive communication deficit: Secondary | ICD-10-CM | POA: Diagnosis not present

## 2018-06-21 DIAGNOSIS — M6281 Muscle weakness (generalized): Secondary | ICD-10-CM | POA: Diagnosis not present

## 2018-06-21 DIAGNOSIS — R278 Other lack of coordination: Secondary | ICD-10-CM | POA: Diagnosis not present

## 2018-06-21 DIAGNOSIS — R2681 Unsteadiness on feet: Secondary | ICD-10-CM | POA: Diagnosis not present

## 2018-06-21 DIAGNOSIS — R41841 Cognitive communication deficit: Secondary | ICD-10-CM | POA: Diagnosis not present

## 2018-06-21 DIAGNOSIS — R488 Other symbolic dysfunctions: Secondary | ICD-10-CM | POA: Diagnosis not present

## 2018-06-22 DIAGNOSIS — R278 Other lack of coordination: Secondary | ICD-10-CM | POA: Diagnosis not present

## 2018-06-22 DIAGNOSIS — M6281 Muscle weakness (generalized): Secondary | ICD-10-CM | POA: Diagnosis not present

## 2018-06-22 DIAGNOSIS — R2681 Unsteadiness on feet: Secondary | ICD-10-CM | POA: Diagnosis not present

## 2018-06-22 DIAGNOSIS — R41841 Cognitive communication deficit: Secondary | ICD-10-CM | POA: Diagnosis not present

## 2018-06-22 DIAGNOSIS — R488 Other symbolic dysfunctions: Secondary | ICD-10-CM | POA: Diagnosis not present

## 2018-06-23 DIAGNOSIS — R278 Other lack of coordination: Secondary | ICD-10-CM | POA: Diagnosis not present

## 2018-06-23 DIAGNOSIS — M6281 Muscle weakness (generalized): Secondary | ICD-10-CM | POA: Diagnosis not present

## 2018-06-23 DIAGNOSIS — R41841 Cognitive communication deficit: Secondary | ICD-10-CM | POA: Diagnosis not present

## 2018-06-23 DIAGNOSIS — R2681 Unsteadiness on feet: Secondary | ICD-10-CM | POA: Diagnosis not present

## 2018-06-23 DIAGNOSIS — R488 Other symbolic dysfunctions: Secondary | ICD-10-CM | POA: Diagnosis not present

## 2018-06-24 DIAGNOSIS — M6281 Muscle weakness (generalized): Secondary | ICD-10-CM | POA: Diagnosis not present

## 2018-06-24 DIAGNOSIS — R488 Other symbolic dysfunctions: Secondary | ICD-10-CM | POA: Diagnosis not present

## 2018-06-24 DIAGNOSIS — R278 Other lack of coordination: Secondary | ICD-10-CM | POA: Diagnosis not present

## 2018-06-24 DIAGNOSIS — R41841 Cognitive communication deficit: Secondary | ICD-10-CM | POA: Diagnosis not present

## 2018-06-24 DIAGNOSIS — R2681 Unsteadiness on feet: Secondary | ICD-10-CM | POA: Diagnosis not present

## 2018-06-25 DIAGNOSIS — R278 Other lack of coordination: Secondary | ICD-10-CM | POA: Diagnosis not present

## 2018-06-25 DIAGNOSIS — R41841 Cognitive communication deficit: Secondary | ICD-10-CM | POA: Diagnosis not present

## 2018-06-25 DIAGNOSIS — R2681 Unsteadiness on feet: Secondary | ICD-10-CM | POA: Diagnosis not present

## 2018-06-25 DIAGNOSIS — M6281 Muscle weakness (generalized): Secondary | ICD-10-CM | POA: Diagnosis not present

## 2018-06-25 DIAGNOSIS — R488 Other symbolic dysfunctions: Secondary | ICD-10-CM | POA: Diagnosis not present

## 2018-06-28 DIAGNOSIS — R488 Other symbolic dysfunctions: Secondary | ICD-10-CM | POA: Diagnosis not present

## 2018-06-28 DIAGNOSIS — R278 Other lack of coordination: Secondary | ICD-10-CM | POA: Diagnosis not present

## 2018-06-28 DIAGNOSIS — R2681 Unsteadiness on feet: Secondary | ICD-10-CM | POA: Diagnosis not present

## 2018-06-28 DIAGNOSIS — R41841 Cognitive communication deficit: Secondary | ICD-10-CM | POA: Diagnosis not present

## 2018-06-28 DIAGNOSIS — M6281 Muscle weakness (generalized): Secondary | ICD-10-CM | POA: Diagnosis not present

## 2018-06-29 DIAGNOSIS — R2681 Unsteadiness on feet: Secondary | ICD-10-CM | POA: Diagnosis not present

## 2018-06-29 DIAGNOSIS — M6281 Muscle weakness (generalized): Secondary | ICD-10-CM | POA: Diagnosis not present

## 2018-06-29 DIAGNOSIS — R41841 Cognitive communication deficit: Secondary | ICD-10-CM | POA: Diagnosis not present

## 2018-06-29 DIAGNOSIS — R278 Other lack of coordination: Secondary | ICD-10-CM | POA: Diagnosis not present

## 2018-06-29 DIAGNOSIS — R488 Other symbolic dysfunctions: Secondary | ICD-10-CM | POA: Diagnosis not present

## 2018-06-30 DIAGNOSIS — R278 Other lack of coordination: Secondary | ICD-10-CM | POA: Diagnosis not present

## 2018-06-30 DIAGNOSIS — R41841 Cognitive communication deficit: Secondary | ICD-10-CM | POA: Diagnosis not present

## 2018-06-30 DIAGNOSIS — R488 Other symbolic dysfunctions: Secondary | ICD-10-CM | POA: Diagnosis not present

## 2018-06-30 DIAGNOSIS — E119 Type 2 diabetes mellitus without complications: Secondary | ICD-10-CM | POA: Diagnosis not present

## 2018-06-30 DIAGNOSIS — Z961 Presence of intraocular lens: Secondary | ICD-10-CM | POA: Diagnosis not present

## 2018-06-30 DIAGNOSIS — R2681 Unsteadiness on feet: Secondary | ICD-10-CM | POA: Diagnosis not present

## 2018-06-30 DIAGNOSIS — H40013 Open angle with borderline findings, low risk, bilateral: Secondary | ICD-10-CM | POA: Diagnosis not present

## 2018-06-30 DIAGNOSIS — M6281 Muscle weakness (generalized): Secondary | ICD-10-CM | POA: Diagnosis not present

## 2018-06-30 DIAGNOSIS — H26491 Other secondary cataract, right eye: Secondary | ICD-10-CM | POA: Diagnosis not present

## 2018-07-01 DIAGNOSIS — R2681 Unsteadiness on feet: Secondary | ICD-10-CM | POA: Diagnosis not present

## 2018-07-01 DIAGNOSIS — R41841 Cognitive communication deficit: Secondary | ICD-10-CM | POA: Diagnosis not present

## 2018-07-01 DIAGNOSIS — M6281 Muscle weakness (generalized): Secondary | ICD-10-CM | POA: Diagnosis not present

## 2018-07-01 DIAGNOSIS — R488 Other symbolic dysfunctions: Secondary | ICD-10-CM | POA: Diagnosis not present

## 2018-07-01 DIAGNOSIS — R278 Other lack of coordination: Secondary | ICD-10-CM | POA: Diagnosis not present

## 2018-07-05 DIAGNOSIS — R41841 Cognitive communication deficit: Secondary | ICD-10-CM | POA: Diagnosis not present

## 2018-07-05 DIAGNOSIS — M6281 Muscle weakness (generalized): Secondary | ICD-10-CM | POA: Diagnosis not present

## 2018-07-05 DIAGNOSIS — R488 Other symbolic dysfunctions: Secondary | ICD-10-CM | POA: Diagnosis not present

## 2018-07-05 DIAGNOSIS — R278 Other lack of coordination: Secondary | ICD-10-CM | POA: Diagnosis not present

## 2018-07-05 DIAGNOSIS — R2681 Unsteadiness on feet: Secondary | ICD-10-CM | POA: Diagnosis not present

## 2018-07-07 DIAGNOSIS — R278 Other lack of coordination: Secondary | ICD-10-CM | POA: Diagnosis not present

## 2018-07-07 DIAGNOSIS — R488 Other symbolic dysfunctions: Secondary | ICD-10-CM | POA: Diagnosis not present

## 2018-07-07 DIAGNOSIS — M6281 Muscle weakness (generalized): Secondary | ICD-10-CM | POA: Diagnosis not present

## 2018-07-07 DIAGNOSIS — R2681 Unsteadiness on feet: Secondary | ICD-10-CM | POA: Diagnosis not present

## 2018-07-07 DIAGNOSIS — R41841 Cognitive communication deficit: Secondary | ICD-10-CM | POA: Diagnosis not present

## 2018-07-08 DIAGNOSIS — R278 Other lack of coordination: Secondary | ICD-10-CM | POA: Diagnosis not present

## 2018-07-08 DIAGNOSIS — R488 Other symbolic dysfunctions: Secondary | ICD-10-CM | POA: Diagnosis not present

## 2018-07-08 DIAGNOSIS — M6281 Muscle weakness (generalized): Secondary | ICD-10-CM | POA: Diagnosis not present

## 2018-07-08 DIAGNOSIS — R2681 Unsteadiness on feet: Secondary | ICD-10-CM | POA: Diagnosis not present

## 2018-07-08 DIAGNOSIS — R41841 Cognitive communication deficit: Secondary | ICD-10-CM | POA: Diagnosis not present

## 2018-07-15 ENCOUNTER — Encounter: Payer: Self-pay | Admitting: Cardiology

## 2018-07-15 DIAGNOSIS — R41841 Cognitive communication deficit: Secondary | ICD-10-CM | POA: Diagnosis not present

## 2018-07-16 ENCOUNTER — Emergency Department (HOSPITAL_COMMUNITY): Payer: Medicare Other

## 2018-07-16 ENCOUNTER — Emergency Department (HOSPITAL_COMMUNITY)
Admission: EM | Admit: 2018-07-16 | Discharge: 2018-07-16 | Disposition: A | Discharge status: Home / Self Care | Payer: Medicare Other | Attending: Emergency Medicine | Admitting: Emergency Medicine

## 2018-07-16 ENCOUNTER — Encounter (HOSPITAL_COMMUNITY): Payer: Self-pay | Admitting: *Deleted

## 2018-07-16 DIAGNOSIS — R41841 Cognitive communication deficit: Secondary | ICD-10-CM | POA: Diagnosis not present

## 2018-07-16 DIAGNOSIS — R609 Edema, unspecified: Secondary | ICD-10-CM | POA: Diagnosis not present

## 2018-07-16 DIAGNOSIS — R52 Pain, unspecified: Secondary | ICD-10-CM | POA: Diagnosis not present

## 2018-07-16 DIAGNOSIS — Z950 Presence of cardiac pacemaker: Secondary | ICD-10-CM | POA: Diagnosis not present

## 2018-07-16 DIAGNOSIS — Y939 Activity, unspecified: Secondary | ICD-10-CM | POA: Diagnosis not present

## 2018-07-16 DIAGNOSIS — J449 Chronic obstructive pulmonary disease, unspecified: Secondary | ICD-10-CM | POA: Insufficient documentation

## 2018-07-16 DIAGNOSIS — S79911A Unspecified injury of right hip, initial encounter: Secondary | ICD-10-CM | POA: Diagnosis not present

## 2018-07-16 DIAGNOSIS — W1830XA Fall on same level, unspecified, initial encounter: Secondary | ICD-10-CM | POA: Diagnosis not present

## 2018-07-16 DIAGNOSIS — Z87891 Personal history of nicotine dependence: Secondary | ICD-10-CM | POA: Diagnosis not present

## 2018-07-16 DIAGNOSIS — S0990XA Unspecified injury of head, initial encounter: Secondary | ICD-10-CM | POA: Diagnosis not present

## 2018-07-16 DIAGNOSIS — S199XXA Unspecified injury of neck, initial encounter: Secondary | ICD-10-CM | POA: Diagnosis not present

## 2018-07-16 DIAGNOSIS — F0390 Unspecified dementia without behavioral disturbance: Secondary | ICD-10-CM | POA: Diagnosis not present

## 2018-07-16 DIAGNOSIS — R457 State of emotional shock and stress, unspecified: Secondary | ICD-10-CM | POA: Diagnosis not present

## 2018-07-16 DIAGNOSIS — I10 Essential (primary) hypertension: Secondary | ICD-10-CM | POA: Diagnosis not present

## 2018-07-16 DIAGNOSIS — Y999 Unspecified external cause status: Secondary | ICD-10-CM | POA: Diagnosis not present

## 2018-07-16 DIAGNOSIS — R51 Headache: Secondary | ICD-10-CM | POA: Insufficient documentation

## 2018-07-16 DIAGNOSIS — E119 Type 2 diabetes mellitus without complications: Secondary | ICD-10-CM | POA: Diagnosis not present

## 2018-07-16 DIAGNOSIS — S0181XA Laceration without foreign body of other part of head, initial encounter: Secondary | ICD-10-CM | POA: Diagnosis not present

## 2018-07-16 DIAGNOSIS — M25511 Pain in right shoulder: Secondary | ICD-10-CM | POA: Diagnosis not present

## 2018-07-16 DIAGNOSIS — W19XXXA Unspecified fall, initial encounter: Secondary | ICD-10-CM

## 2018-07-16 DIAGNOSIS — G309 Alzheimer's disease, unspecified: Secondary | ICD-10-CM | POA: Insufficient documentation

## 2018-07-16 DIAGNOSIS — Y92129 Unspecified place in nursing home as the place of occurrence of the external cause: Secondary | ICD-10-CM | POA: Diagnosis not present

## 2018-07-16 DIAGNOSIS — S01111A Laceration without foreign body of right eyelid and periocular area, initial encounter: Secondary | ICD-10-CM | POA: Diagnosis not present

## 2018-07-16 DIAGNOSIS — Z7984 Long term (current) use of oral hypoglycemic drugs: Secondary | ICD-10-CM | POA: Insufficient documentation

## 2018-07-16 DIAGNOSIS — S6991XA Unspecified injury of right wrist, hand and finger(s), initial encounter: Secondary | ICD-10-CM | POA: Diagnosis not present

## 2018-07-16 MED ORDER — OXYCODONE-ACETAMINOPHEN 5-325 MG PO TABS
1.0000 | ORAL_TABLET | Freq: Once | ORAL | Status: AC
  Administered 2018-07-16: 1 via ORAL
  Filled 2018-07-16: qty 1

## 2018-07-16 MED ORDER — ACETAMINOPHEN 325 MG PO TABS: 650.0000 mg | ORAL_TABLET | Freq: Three times a day (TID) | ORAL | 0 refills | Status: AC | PRN

## 2018-07-16 MED ORDER — LIDOCAINE-EPINEPHRINE-TETRACAINE (LET) SOLUTION
3.0000 mL | Freq: Once | NASAL | Status: AC
  Administered 2018-07-16: 3 mL via TOPICAL
  Filled 2018-07-16: qty 3

## 2018-07-16 NOTE — ED Provider Notes (Addendum)
[facility] DEPT Provider Note   CSN: [id number] Arrival date & time: 07/16/18  0550     History   Chief Complaint Chief Complaint  Patient presents with  . Fall    HPI Melissa Fischer is a 84 y.o. female with history of Alzheimer's dementia presenting from nursing home following unwitnessed fall earlier this morning.  EMS was called around 5 AM and at that time patient was complaining of right shoulder and right cheek pain.  Patient with laceration to the lateral aspect of her right orbit.  CT head, neck and right shoulder x-ray ordered by nursing staff.  Patient is alert, not oriented, pleasant.  States that she is having pain in her shoulder and her face.   Patient's daughter at bedside, she reports that the patient's mental status is at baseline baseline.  HPI  Past Medical History:  Diagnosis Date  . Alzheimer disease   . Bronchitis   . COPD (chronic obstructive pulmonary disease) ([place])   . Depression   . Diabetes ([place])   . Hypercholesteremia   . Memory loss   . Pacemaker    [employer] DOI 2011  . Peripheral neuropathy   . Renal disease   . Shoulder pain, left   . Syncope     Patient Active Problem List   Diagnosis Date Noted  . Alzheimer's dementia without behavioral disturbance 12/03/2017  . Gait abnormality 04/02/2017  . SIADH (syndrome of inappropriate ADH production) ([place]) 02/06/2017  . Encephalopathy acute 02/01/2017  . Hyponatremia 01/31/2017  . Acute encephalopathy 01/31/2017  . Generalized weakness   . Influenza A 01/25/2017  . Hip fracture ([place]) 04/26/2016  . Type 2 diabetes mellitus with hyperglycemia ([place]) 04/26/2016  . COPD (chronic obstructive pulmonary disease) ([place]) 04/26/2016  . Closed right hip fracture ([place]) 04/26/2016  . Subcapital fracture of right femur ([place])   . Alzheimer's dementia with behavioral disturbance 03/31/2016  . DM (diabetes mellitus) type II controlled, neurological manifestation ([place])  10/30/2015  . Depression 10/30/2015  . CKD (chronic kidney disease) 10/30/2015    Past Surgical History:  Procedure Laterality Date  . HIP PINNING,CANNULATED Right 04/26/2016   Procedure: CANNULATED HIP PINNING;  Surgeon: Tania Ade, MD;  Location: [facility];  Service: Orthopedics;  Laterality: Right;  . no surgical history    . PACEMAKER PLACEMENT       OB History   None      Home Medications    Prior to Admission medications   Medication Sig Start Date End Date Taking? Authorizing Provider  alendronate (FOSAMAX) 70 MG tablet Take 70 mg by mouth every Monday. Take with a full glass of water on an empty stomach.   Yes [provider]  calcium-vitamin D (OSCAL WITH D) 250-125 MG-UNIT tablet Take 1 tablet by mouth daily.    Yes [provider]  Ergocalciferol (VITAMIN D2) 2000 units TABS Take 1 tablet by mouth daily.   Yes [provider]  fexofenadine (ALLEGRA) 180 MG tablet Take 180 mg by mouth daily.    Yes [provider]  Fluticasone-Salmeterol (ADVAIR) 250-50 MCG/DOSE AEPB Inhale 1 puff into the lungs 2 (two) times daily.   Yes [provider]  metFORMIN (GLUCOPHAGE-XR) 500 MG 24 hr tablet Take 500-1,000 mg by mouth 2 (two) times daily. Take two tablets in the morning and one tablet in the evening. 01/23/16  Yes [provider]  methocarbamol (ROBAXIN) 500 MG tablet Take 125 mg by mouth 2 (two) times daily.  03/25/17  Yes [provider]  acetaminophen (TYLENOL) 325 MG tablet Take 2 tablets (650 mg total) by mouth every 8 (eight) hours as needed. 07/16/18   Deliah Boston, PA-C    Family History Family History  Problem Relation Age of Onset  . Diabetes Mother   . Dementia Neg Hx     Social History Social History   Tobacco Use  . Smoking status: Former Smoker    Last attempt to quit: 12/29/1974    Years since quitting: 43.5  . Smokeless tobacco: Never Used  Substance Use Topics  . Alcohol use: Yes     Alcohol/week: 0.0 oz    Comment: 1 drinks per day (10/29/15) sometimes none per day  . Drug use: No     Allergies   Aricept [donepezil]; Exelon [rivastigmine]; Namenda [memantine]; Penicillins; Statins; Sulfa antibiotics; and Wellbutrin [bupropion]   Review of Systems Review of Systems  Unable to perform ROS: Dementia    Physical Exam Updated Vital Signs BP (!) 126/55 (BP Location: Left Arm)   Pulse 65   Temp 97.9 F (36.6 C) (Oral)   Resp 18   SpO2 99%   Physical Exam  Constitutional: No distress.  HENT:  Head: Head is with laceration. Head is without raccoon's eyes and without Battle's sign.    Right Ear: External ear normal.  Left Ear: External ear normal.  Nose: Nose normal.  Mouth/Throat: Oropharynx is clear and moist.  Eyes: Pupils are equal, round, and reactive to light. Conjunctivae and EOM are normal.  Neck: Normal range of motion. Neck supple. No JVD present. No tracheal deviation present.  Cardiovascular: Normal rate, regular rhythm, normal heart sounds and intact distal pulses.  Pulmonary/Chest: Effort normal and breath sounds normal. No respiratory distress. She has no wheezes.  Abdominal: Soft. Bowel sounds are normal. There is no tenderness. There is no rebound and no guarding.  Musculoskeletal:       Right shoulder: She exhibits tenderness. She exhibits no swelling and no deformity.       Right wrist: She exhibits tenderness. She exhibits no swelling and no deformity.       Cervical back: She exhibits normal range of motion, no swelling, no edema and no deformity.       Thoracic back: Normal.       Lumbar back: Normal.  No cervical, thoracic or lumbar spinal deformity, crepitus, or step-off noted.  Neurological: She is alert.  Skin: Skin is warm and dry.  Psychiatric: She has a normal mood and affect. Her behavior is normal.     ED Treatments / Results  Labs (all labs ordered are listed, but only abnormal results are displayed) Labs Reviewed - No  data to display  EKG None  Radiology Dg Shoulder Right  Result Date: 07/16/2018 CLINICAL DATA:  84 year old female with fall.  Right shoulder pain. EXAM: RIGHT SHOULDER - 2+ VIEW COMPARISON:  Right shoulder CT dated 10/29/2015 FINDINGS: There is no acute fracture or dislocation. The bones are osteopenic. No significant arthritic changes. The soft tissues are unremarkable. Partially visualized pacemaker wires. IMPRESSION: No acute fracture or dislocation. Electronically Signed   By: Anner Crete M.D.   On: 07/16/2018 06:45   Dg Wrist Complete Right  Result Date: 07/16/2018 CLINICAL DATA:  Fall EXAM: RIGHT WRIST - COMPLETE 3+ VIEW COMPARISON:  07/16/2018 FINDINGS: Negative for fracture Moderate degenerative change base of thumb. Chronic fracture of the radial styloid. Chondrocalcinosis in the triangular fibrocartilage. IMPRESSION: Negative for acute fracture. Electronically Signed  By: Franchot Gallo M.D.   On: 07/16/2018 07:35   Ct Head Wo Contrast  Result Date: 07/16/2018 CLINICAL DATA:  84 year old female with fall. EXAM: CT HEAD WITHOUT CONTRAST CT CERVICAL SPINE WITHOUT CONTRAST TECHNIQUE: Multidetector CT imaging of the head and cervical spine was performed following the standard protocol without intravenous contrast. Multiplanar CT image reconstructions of the cervical spine were also generated. COMPARISON:  Head CT dated 01/31/2017 FINDINGS: Evaluation of this exam is limited due to motion artifact. CT HEAD FINDINGS Brain: Mild to moderate age-related atrophy and chronic microvascular ischemic changes. There is no acute intracranial hemorrhage. No mass effect or midline shift. No extra-axial fluid collection. Vascular: No hyperdense vessel or unexpected calcification. Faint ill-defined density in the suprasellar region (series 3, image 12) may represent optic chiasm. However vascular aneurysm is not entirely excluded. CT angiography may provide better evaluation. Skull: Normal. Negative  for fracture or focal lesion. Sinuses/Orbits: There is mild mucoperiosteal thickening of paranasal sinuses. There is probable fracture of the posterolateral wall of the right maxillary sinus. Further evaluation with maxillofacial CT recommended. Right maxillary sinus air-fluid level. The mastoid air cells are clear. Other: Right periorbital contusion. CT CERVICAL SPINE FINDINGS Alignment: No acute subluxation.  Grade 1 C4-C5 anterolisthesis. Skull base and vertebrae: No acute fracture. Soft tissues and spinal canal: No prevertebral fluid or swelling. No visible canal hematoma. Disc levels: Multilevel degenerative changes. Multilevel facet hypertrophy most prominent at C4-C5 on the right with associated narrowing of the neural foramina at this level. Upper chest: Negative. Other: Small left thyroid hypodense nodule. Bilateral carotid bulb calcified plaques. IMPRESSION: 1. No acute intracranial hemorrhage. 2. Age-related atrophy and chronic microvascular ischemic changes. 3. Ill-defined density in the suprasellar region likely represent optic chiasm. Vascular aneurysm is not excluded. CT angiography may provide better evaluation. 4. No acute/traumatic cervical spine pathology. Multilevel degenerative changes. Electronically Signed   By: Anner Crete M.D.   On: 07/16/2018 06:43   Ct Cervical Spine Wo Contrast  Result Date: 07/16/2018 CLINICAL DATA:  84 year old female with fall. EXAM: CT HEAD WITHOUT CONTRAST CT CERVICAL SPINE WITHOUT CONTRAST TECHNIQUE: Multidetector CT imaging of the head and cervical spine was performed following the standard protocol without intravenous contrast. Multiplanar CT image reconstructions of the cervical spine were also generated. COMPARISON:  Head CT dated 01/31/2017 FINDINGS: Evaluation of this exam is limited due to motion artifact. CT HEAD FINDINGS Brain: Mild to moderate age-related atrophy and chronic microvascular ischemic changes. There is no acute intracranial  hemorrhage. No mass effect or midline shift. No extra-axial fluid collection. Vascular: No hyperdense vessel or unexpected calcification. Faint ill-defined density in the suprasellar region (series 3, image 12) may represent optic chiasm. However vascular aneurysm is not entirely excluded. CT angiography may provide better evaluation. Skull: Normal. Negative for fracture or focal lesion. Sinuses/Orbits: There is mild mucoperiosteal thickening of paranasal sinuses. There is probable fracture of the posterolateral wall of the right maxillary sinus. Further evaluation with maxillofacial CT recommended. Right maxillary sinus air-fluid level. The mastoid air cells are clear. Other: Right periorbital contusion. CT CERVICAL SPINE FINDINGS Alignment: No acute subluxation.  Grade 1 C4-C5 anterolisthesis. Skull base and vertebrae: No acute fracture. Soft tissues and spinal canal: No prevertebral fluid or swelling. No visible canal hematoma. Disc levels: Multilevel degenerative changes. Multilevel facet hypertrophy most prominent at C4-C5 on the right with associated narrowing of the neural foramina at this level. Upper chest: Negative. Other: Small left thyroid hypodense nodule. Bilateral carotid bulb calcified plaques. IMPRESSION: 1.  No acute intracranial hemorrhage. 2. Age-related atrophy and chronic microvascular ischemic changes. 3. Ill-defined density in the suprasellar region likely represent optic chiasm. Vascular aneurysm is not excluded. CT angiography may provide better evaluation. 4. No acute/traumatic cervical spine pathology. Multilevel degenerative changes. Electronically Signed   By: Anner Crete M.D.   On: 07/16/2018 06:43   Dg Hand Complete Right  Result Date: 07/16/2018 CLINICAL DATA:  Unwitnessed fall. EXAM: RIGHT HAND - COMPLETE 3+ VIEW COMPARISON:  No recent prior. FINDINGS: Diffuse degenerative change. Diffuse osteopenia. Corticated bony density noted adjacent to the radius styloid, most likely  an old fracture fragment. No acute bony or joint abnormality. No evidence of acute fracture or dislocation. IMPRESSION: Diffuse degenerative change. Diffuse osteopenia. No acute bony abnormality. Electronically Signed   By: Marcello Moores  Register   On: 07/16/2018 07:31   Dg Hips Bilat W Or Wo Pelvis 5 Views  Result Date: 07/16/2018 CLINICAL DATA:  Unwitnessed fall. EXAM: DG HIP (WITH OR WITHOUT PELVIS) 5+V BILAT COMPARISON:  04/26/2016. FINDINGS: Surgical screws noted in the right hip. Hardware intact. Anatomic alignment. Degenerative changes lumbar spine and both hips. Diffuse osteopenia. No acute bony abnormality. Peripheral vascular calcification. IMPRESSION: 1. Postsurgical change right hip. Hardware intact. Anatomic alignment. Diffuse osteopenia and degenerative change. No acute abnormality identified. No evidence of acute fracture or dislocation. 2.  Peripheral vascular disease. Electronically Signed   By: Marcello Moores  Register   On: 07/16/2018 07:34    Procedures .[place] KitchenLaceration Repair Date/Time: 07/16/2018 7:52 AM Performed by: Deliah Boston, PA-C Authorized by: Deliah Boston, PA-C   Consent:    Consent obtained:  Verbal   Consent given by:  Guardian   Risks discussed:  Poor cosmetic result, pain, infection, poor wound healing, need for additional repair, retained foreign body, tendon damage, vascular damage and nerve damage Anesthesia (see MAR for exact dosages):    Anesthesia method:  Topical application   Topical anesthetic:  LET Laceration details:    Location:  Face   Face location:  R eyebrow   Length (cm):  3   Depth (mm):  3 Pre-procedure details:    Preparation:  Patient was prepped and draped in usual sterile fashion Exploration:    Hemostasis achieved with:  LET   Wound exploration: entire depth of wound probed and visualized     Wound extent: no foreign bodies/material noted, no muscle damage noted, no nerve damage noted, no tendon damage noted, no underlying fracture  noted and no vascular damage noted     Contaminated: no   Treatment:    Area cleansed with:  Soap and water Skin repair:    Repair method:  Tissue adhesive Approximation:    Approximation:  Close Post-procedure details:    Dressing:  Sterile dressing   Patient tolerance of procedure:  Tolerated well, no immediate complications Comments:     Patient's wound cleaned and let applied by nursing staff. Patient with clean approximately 3cm non-gaping laceration on the lateral aspect of the right orbit, below the edge of the right eyebrow.  Dr. Nicholes Stairs and I have both seen this laceration, we agree that Dermabond would be the best option due to patient's mental state, anxious/dementia. I have applied the Dermabond to the laceration with help of nursing staff, that approximation, no active bleeding, wound edges held together well. Wound covered by nursing staff.   (including critical care time)  Medications Ordered in ED Medications  lidocaine-EPINEPHrine-tetracaine (LET) solution (3 mLs Topical Given 07/16/18 0700)  oxyCODONE-acetaminophen (PERCOCET/ROXICET) 5-325 MG  per tablet 1 tablet (1 tablet Oral Given 07/16/18 0703)     Initial Impression / Assessment and Plan / ED Course  I have reviewed the triage vital signs and the nursing notes.  Pertinent labs & imaging results that were available during my care of the patient were reviewed by me and considered in my medical decision making (see chart for details).    Elderly patient with dementia presenting after fall at nursing home. Patient at baseline mental status per patient's daughter.  Laceration repaired with dermabond as documented above. Imaging negative for acute findings today. Follow-up with primary-care provider strongly encouraged. Appears safe to return to SNF.  At this time there does not appear to be any evidence of an acute emergency medical condition and the patient appears stable for discharge with appropriate  outpatient follow up. Diagnosis was discussed with patient's daughter who verbalizes understanding and is agreeable to discharge. I have discussed return precautions with patient's family who verbalize understanding of return precautions. Patient strongly encouraged to follow-up with their PCP.  Patient's case discussed with and patient seen by Dr. Randal Buba who agrees with plan to discharge with follow-up.     This note was dictated using DragonOne dictation software; please contact for any inconsistencies within the note.    Final Clinical Impressions(s) / ED Diagnoses   Final diagnoses:  Fall, initial encounter  Facial laceration, initial encounter    ED Discharge Orders        Ordered    acetaminophen (TYLENOL) 325 MG tablet  Every 8 hours PRN     07/16/18 0807       Deliah Boston, PA-C 07/16/18 1501    Deliah Boston, PA-C 07/16/18 1503    Deliah Boston, PA-C 07/16/18 1506    Palumbo, April, MD 07/16/18 2346

## 2018-07-16 NOTE — ED Notes (Signed)
Pt cleaned and changed from urinary incontinence with 2 assist for log rolling. Brief placed on pt at this time. Pt tolerated change well.

## 2018-07-16 NOTE — ED Notes (Signed)
Pt's earring given to daughter, [name]

## 2018-07-16 NOTE — Discharge Instructions (Addendum)
Please call your primary care provider as soon as you can to follow-up regarding your visit today. Please be sure to discuss your fall, laceration and CT scan results with your doctor. Please return to the emergency department for any new or worsening symptoms or if your symptoms do not improve. You may use Tylenol as prescribed for pain.  Contact a health care provider if: Your pain is not controlled with medicine. You have more redness, swelling, or pain around the wound. You have more fluid or blood coming from the wound. Your wound feels warm to the touch. You have pus or a bad smell coming from the wound. You have a fever or chills. You are nauseous or you vomit. You are dizzy. Get help right away if: You have a red streak going away from your wound. The edges of the wound open up and separate. Your wound is bleeding and the bleeding does not stop with gentle pressure. You have a rash. You faint. You have trouble breathing. Get help right away if: You have: A very bad (severe) headache that is not helped by medicine. Trouble walking or weakness in your arms and legs. Clear or bloody fluid coming from your nose or ears. Changes in your seeing (vision). Jerky movements that you cannot control (seizure). You throw up (vomit). Your symptoms get worse. You lose balance. Your speech is slurred. You pass out. You are sleepier and have trouble staying awake. The black centers of your eyes (pupils) change in size.

## 2018-07-16 NOTE — ED Notes (Signed)
Pt returned from CT scan and daughter at bedside.

## 2018-07-16 NOTE — ED Triage Notes (Signed)
Pt from [facility] in the [place] unit. Pt was found by staff on the floor. EMS was called out at 0458. Bruising noticed by EMS to Right cheek and pt complaining of right shoulder pain. Laceration noted to right cheek bone. Pt freely moving all extremities. Pt with incontinence of urine and not aware of history of incontinence. Staff at [facility] states that pt's current orientation is her baseline. Vitals with EMS 146/88 72 bpm, 18RR, CBG 147

## 2018-07-16 NOTE — ED Notes (Signed)
Bed: OE69 Expected date:  Expected time:  Means of arrival:  Comments: EMS 84yo Fall

## 2018-07-16 NOTE — ED Notes (Signed)
Patient transported to X-ray 

## 2018-07-16 NOTE — ED Notes (Addendum)
Laceration is to pt's right lateral brow line. Clean 2x2 gauze placed over wound.  C-collar in place from EMS

## 2018-07-19 DIAGNOSIS — R41841 Cognitive communication deficit: Secondary | ICD-10-CM | POA: Diagnosis not present

## 2018-07-26 DIAGNOSIS — R41841 Cognitive communication deficit: Secondary | ICD-10-CM | POA: Diagnosis not present

## 2018-08-02 DIAGNOSIS — R41841 Cognitive communication deficit: Secondary | ICD-10-CM | POA: Diagnosis not present

## 2018-08-23 DIAGNOSIS — I10 Essential (primary) hypertension: Secondary | ICD-10-CM | POA: Diagnosis not present

## 2018-08-23 DIAGNOSIS — G309 Alzheimer's disease, unspecified: Secondary | ICD-10-CM | POA: Diagnosis not present

## 2018-08-23 DIAGNOSIS — Z79899 Other long term (current) drug therapy: Secondary | ICD-10-CM | POA: Diagnosis not present

## 2018-08-23 DIAGNOSIS — E7800 Pure hypercholesterolemia, unspecified: Secondary | ICD-10-CM | POA: Diagnosis not present

## 2018-08-23 DIAGNOSIS — E1140 Type 2 diabetes mellitus with diabetic neuropathy, unspecified: Secondary | ICD-10-CM | POA: Diagnosis not present

## 2018-08-23 DIAGNOSIS — Z7984 Long term (current) use of oral hypoglycemic drugs: Secondary | ICD-10-CM | POA: Diagnosis not present

## 2018-08-23 DIAGNOSIS — Z950 Presence of cardiac pacemaker: Secondary | ICD-10-CM | POA: Diagnosis not present

## 2018-08-23 DIAGNOSIS — R269 Unspecified abnormalities of gait and mobility: Secondary | ICD-10-CM | POA: Diagnosis not present

## 2018-11-01 DIAGNOSIS — E1140 Type 2 diabetes mellitus with diabetic neuropathy, unspecified: Secondary | ICD-10-CM | POA: Diagnosis not present

## 2018-11-01 DIAGNOSIS — G309 Alzheimer's disease, unspecified: Secondary | ICD-10-CM | POA: Diagnosis not present

## 2018-11-01 DIAGNOSIS — B372 Candidiasis of skin and nail: Secondary | ICD-10-CM | POA: Diagnosis not present

## 2018-11-01 DIAGNOSIS — Z23 Encounter for immunization: Secondary | ICD-10-CM | POA: Diagnosis not present

## 2018-11-01 DIAGNOSIS — I10 Essential (primary) hypertension: Secondary | ICD-10-CM | POA: Diagnosis not present

## 2018-12-06 ENCOUNTER — Ambulatory Visit (INDEPENDENT_AMBULATORY_CARE_PROVIDER_SITE_OTHER): Payer: Medicare Other | Admitting: Adult Health

## 2018-12-06 ENCOUNTER — Encounter: Payer: Self-pay | Admitting: Adult Health

## 2018-12-06 VITALS — BP 132/65 | HR 64 | Ht 67.25 in | Wt 155.6 lb

## 2018-12-06 DIAGNOSIS — F0280 Dementia in other diseases classified elsewhere without behavioral disturbance: Secondary | ICD-10-CM

## 2018-12-06 DIAGNOSIS — G301 Alzheimer's disease with late onset: Secondary | ICD-10-CM | POA: Diagnosis not present

## 2018-12-06 NOTE — Patient Instructions (Signed)
Your Plan:  Continue to monitor symptoms Memory score slightly declined If crying episodes continue we can consider adding depakote If your symptoms worsen or you develop new symptoms please let us know.   Thank you for coming to see [place] at [facility]. I hope we have been able to provide you high quality care today.  You may receive a patient satisfaction survey over the next few weeks. We would appreciate your feedback and comments so that we may continue to improve ourselves and the health of our patients.
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
## 2019-02-22 DIAGNOSIS — Z950 Presence of cardiac pacemaker: Secondary | ICD-10-CM | POA: Diagnosis not present

## 2019-02-22 DIAGNOSIS — E1140 Type 2 diabetes mellitus with diabetic neuropathy, unspecified: Secondary | ICD-10-CM | POA: Diagnosis not present

## 2019-02-22 DIAGNOSIS — E113293 Type 2 diabetes mellitus with mild nonproliferative diabetic retinopathy without macular edema, bilateral: Secondary | ICD-10-CM | POA: Diagnosis not present

## 2019-02-22 DIAGNOSIS — G309 Alzheimer's disease, unspecified: Secondary | ICD-10-CM | POA: Diagnosis not present

## 2019-02-22 DIAGNOSIS — Z1389 Encounter for screening for other disorder: Secondary | ICD-10-CM | POA: Diagnosis not present

## 2019-02-22 DIAGNOSIS — E222 Syndrome of inappropriate secretion of antidiuretic hormone: Secondary | ICD-10-CM | POA: Diagnosis not present

## 2019-02-22 DIAGNOSIS — I10 Essential (primary) hypertension: Secondary | ICD-10-CM | POA: Diagnosis not present

## 2019-02-22 DIAGNOSIS — Z79899 Other long term (current) drug therapy: Secondary | ICD-10-CM | POA: Diagnosis not present

## 2019-05-08 ENCOUNTER — Emergency Department (HOSPITAL_COMMUNITY): Payer: Medicare Other

## 2019-05-08 ENCOUNTER — Encounter (HOSPITAL_COMMUNITY): Payer: Self-pay

## 2019-05-08 ENCOUNTER — Other Ambulatory Visit: Payer: Self-pay

## 2019-05-08 ENCOUNTER — Emergency Department (HOSPITAL_COMMUNITY)
Admission: EM | Admit: 2019-05-08 | Discharge: 2019-05-08 | Disposition: A | Discharge status: Home / Self Care | Payer: Medicare Other | Attending: Emergency Medicine | Admitting: Emergency Medicine

## 2019-05-08 DIAGNOSIS — Z7984 Long term (current) use of oral hypoglycemic drugs: Secondary | ICD-10-CM | POA: Insufficient documentation

## 2019-05-08 DIAGNOSIS — Y999 Unspecified external cause status: Secondary | ICD-10-CM | POA: Insufficient documentation

## 2019-05-08 DIAGNOSIS — Z79899 Other long term (current) drug therapy: Secondary | ICD-10-CM | POA: Insufficient documentation

## 2019-05-08 DIAGNOSIS — I10 Essential (primary) hypertension: Secondary | ICD-10-CM | POA: Diagnosis not present

## 2019-05-08 DIAGNOSIS — Y939 Activity, unspecified: Secondary | ICD-10-CM | POA: Diagnosis not present

## 2019-05-08 DIAGNOSIS — G309 Alzheimer's disease, unspecified: Secondary | ICD-10-CM | POA: Diagnosis not present

## 2019-05-08 DIAGNOSIS — R51 Headache: Secondary | ICD-10-CM | POA: Diagnosis not present

## 2019-05-08 DIAGNOSIS — R404 Transient alteration of awareness: Secondary | ICD-10-CM | POA: Diagnosis not present

## 2019-05-08 DIAGNOSIS — F0280 Dementia in other diseases classified elsewhere without behavioral disturbance: Secondary | ICD-10-CM | POA: Diagnosis not present

## 2019-05-08 DIAGNOSIS — W19XXXA Unspecified fall, initial encounter: Secondary | ICD-10-CM

## 2019-05-08 DIAGNOSIS — Z950 Presence of cardiac pacemaker: Secondary | ICD-10-CM | POA: Diagnosis not present

## 2019-05-08 DIAGNOSIS — S199XXA Unspecified injury of neck, initial encounter: Secondary | ICD-10-CM | POA: Diagnosis not present

## 2019-05-08 DIAGNOSIS — S0990XA Unspecified injury of head, initial encounter: Secondary | ICD-10-CM | POA: Diagnosis not present

## 2019-05-08 DIAGNOSIS — S79912A Unspecified injury of left hip, initial encounter: Secondary | ICD-10-CM | POA: Diagnosis not present

## 2019-05-08 DIAGNOSIS — Y92129 Unspecified place in nursing home as the place of occurrence of the external cause: Secondary | ICD-10-CM | POA: Insufficient documentation

## 2019-05-08 DIAGNOSIS — Z87891 Personal history of nicotine dependence: Secondary | ICD-10-CM | POA: Insufficient documentation

## 2019-05-08 DIAGNOSIS — N189 Chronic kidney disease, unspecified: Secondary | ICD-10-CM | POA: Insufficient documentation

## 2019-05-08 DIAGNOSIS — Z23 Encounter for immunization: Secondary | ICD-10-CM | POA: Diagnosis not present

## 2019-05-08 DIAGNOSIS — E1122 Type 2 diabetes mellitus with diabetic chronic kidney disease: Secondary | ICD-10-CM | POA: Insufficient documentation

## 2019-05-08 DIAGNOSIS — J449 Chronic obstructive pulmonary disease, unspecified: Secondary | ICD-10-CM | POA: Insufficient documentation

## 2019-05-08 DIAGNOSIS — S0001XA Abrasion of scalp, initial encounter: Secondary | ICD-10-CM | POA: Diagnosis not present

## 2019-05-08 DIAGNOSIS — S79911A Unspecified injury of right hip, initial encounter: Secondary | ICD-10-CM | POA: Diagnosis not present

## 2019-05-08 DIAGNOSIS — R58 Hemorrhage, not elsewhere classified: Secondary | ICD-10-CM | POA: Diagnosis not present

## 2019-05-08 DIAGNOSIS — W010XXA Fall on same level from slipping, tripping and stumbling without subsequent striking against object, initial encounter: Secondary | ICD-10-CM | POA: Diagnosis not present

## 2019-05-08 DIAGNOSIS — S299XXA Unspecified injury of thorax, initial encounter: Secondary | ICD-10-CM | POA: Diagnosis not present

## 2019-05-08 LAB — BASIC METABOLIC PANEL
Anion gap: 11 (ref 5–15)
BUN: 13 mg/dL (ref 8–23)
CO2: 24 mmol/L (ref 22–32)
Calcium: 9.6 mg/dL (ref 8.9–10.3)
Chloride: 102 mmol/L (ref 98–111)
Creatinine, Ser: 0.72 mg/dL (ref 0.44–1.00)
GFR calc Af Amer: >60 mL/min (ref >60)
GFR calc non Af Amer: >60 mL/min (ref >60)
Glucose, Bld: 129 mg/dL — ABNORMAL HIGH (ref 70–99)
Potassium: 4.3 mmol/L (ref 3.5–5.1)
Sodium: 137 mmol/L (ref 135–145)

## 2019-05-08 LAB — CBC
HCT: 37.9 % (ref 36.0–46.0)
Hemoglobin: 12.8 g/dL (ref 12.0–15.0)
MCH: 30.5 pg (ref 26.0–34.0)
MCHC: 33.8 g/dL (ref 30.0–36.0)
MCV: 90.5 fL (ref 80.0–100.0)
Platelets: 285 10*3/uL (ref 150–400)
RBC: 4.19 MIL/uL (ref 3.87–5.11)
RDW: 12.2 % (ref 11.5–15.5)
WBC: 10.7 10*3/uL — ABNORMAL HIGH (ref 4.0–10.5)
nRBC: 0 % (ref 0.0–0.2)

## 2019-05-08 LAB — URINALYSIS, ROUTINE W REFLEX MICROSCOPIC
Bilirubin Urine: NEGATIVE
Glucose, UA: NEGATIVE mg/dL
Hgb urine dipstick: NEGATIVE
Ketones, ur: 5 mg/dL — AB
Leukocytes,Ua: NEGATIVE
Nitrite: NEGATIVE
Protein, ur: NEGATIVE mg/dL
Specific Gravity, Urine: 1.016 (ref 1.005–1.030)
pH: 5 (ref 5.0–8.0)

## 2019-05-08 MED ORDER — TETANUS-DIPHTH-ACELL PERTUSSIS 5-2.5-18.5 LF-MCG/0.5 IM SUSP
0.5000 mL | Freq: Once | INTRAMUSCULAR | Status: AC
  Administered 2019-05-08: 0.5 mL via INTRAMUSCULAR
  Filled 2019-05-08: qty 0.5

## 2019-05-08 NOTE — ED Provider Notes (Signed)
[facility] DEPT Provider Note   CSN: [id number] Arrival date & time: 05/08/19  1240    History   Chief Complaint Chief Complaint  Patient presents with  . Fall  . Head Laceration    HPI Melissa Fischer is a 85 y.o. female.     HPI Patient presents to the emergency room for evaluation of a fall.  Patient lives at a memory care unit.  She had an unwitnessed fall.  Patient was found lying on top of her walker.  She was noted to have a laceration behind the right side of her head.  The patient is confused but according to the EMS report this is her baseline.  Patient herself states she had a fall but is unable to tell me the details.  She states it is a long story.  She complains of hurting all over.  She denies any chest pain or shortness of breath.  No fevers.  No vomiting or diarrhea. Past Medical History:  Diagnosis Date  . Alzheimer disease ([place])   . Bronchitis   . COPD (chronic obstructive pulmonary disease) ([place])   . Depression   . Diabetes ([place])   . Hypercholesteremia   . Memory loss   . Pacemaker    [employer] DOI 2011  . Peripheral neuropathy   . Renal disease   . Shoulder pain, left   . Syncope     Patient Active Problem List   Diagnosis Date Noted  . Alzheimer's dementia without behavioral disturbance ([place]) 12/03/2017  . Gait abnormality 04/02/2017  . SIADH (syndrome of inappropriate ADH production) ([place]) 02/06/2017  . Encephalopathy acute 02/01/2017  . Hyponatremia 01/31/2017  . Acute encephalopathy 01/31/2017  . Generalized weakness   . Influenza A 01/25/2017  . Hip fracture ([place]) 04/26/2016  . Type 2 diabetes mellitus with hyperglycemia ([place]) 04/26/2016  . COPD (chronic obstructive pulmonary disease) ([place]) 04/26/2016  . Closed right hip fracture ([place]) 04/26/2016  . Subcapital fracture of right femur ([place])   . Alzheimer's dementia with behavioral disturbance ([place]) 03/31/2016  . DM (diabetes mellitus) type II controlled,  neurological manifestation ([place]) 10/30/2015  . Depression 10/30/2015  . CKD (chronic kidney disease) 10/30/2015    Past Surgical History:  Procedure Laterality Date  . HIP PINNING,CANNULATED Right 04/26/2016   Procedure: CANNULATED HIP PINNING;  Surgeon: Tania Ade, MD;  Location: [facility] ORS;  Service: Orthopedics;  Laterality: Right;  . no surgical history    . PACEMAKER PLACEMENT       OB History   No obstetric history on file.      Home Medications    Prior to Admission medications   Medication Sig Start Date End Date Taking? Authorizing Provider  acetaminophen (TYLENOL) 325 MG tablet Take 2 tablets (650 mg total) by mouth every 8 (eight) hours as needed. 07/16/18   Deliah Boston, PA-C  alendronate (FOSAMAX) 70 MG tablet Take 70 mg by mouth every Monday. Take with a full glass of water on an empty stomach.    [provider]  calcium-vitamin D (OSCAL WITH D) 250-125 MG-UNIT tablet Take 1 tablet by mouth daily.     [provider]  Ergocalciferol (VITAMIN D2) 2000 units TABS Take 1 tablet by mouth daily.    [provider]  fexofenadine (ALLEGRA) 180 MG tablet Take 180 mg by mouth daily.     [provider]  Fluticasone-Salmeterol (ADVAIR) 250-50 MCG/DOSE AEPB Inhale 1 puff into the lungs 2 (two) times daily.  [provider]  metFORMIN (GLUCOPHAGE-XR) 500 MG 24 hr tablet Take 500-1,000 mg by mouth 2 (two) times daily. Take two tablets in the morning and one tablet in the evening. 01/23/16   [provider]  methocarbamol (ROBAXIN) 500 MG tablet Take 125 mg by mouth 2 (two) times daily.  03/25/17   [provider]    Family History Family History  Problem Relation Age of Onset  . Diabetes Mother   . Dementia Neg Hx     Social History Social History   Tobacco Use  . Smoking status: Former Smoker    Last attempt to quit: 12/29/1974    Years since quitting: 44.3  . Smokeless tobacco: Never Used  Substance  Use Topics  . Alcohol use: Yes    Alcohol/week: 0.0 standard drinks    Comment: 1 drinks per day (10/29/15) sometimes none per day  . Drug use: No     Allergies   Aricept [donepezil]; Exelon [rivastigmine]; Namenda [memantine]; Penicillins; Statins; Sulfa antibiotics; and Wellbutrin [bupropion]   Review of Systems Review of Systems  All other systems reviewed and are negative.    Physical Exam Updated Vital Signs BP (!) 162/62 (BP Location: Right Arm)   Pulse 95   Resp 16   Ht 1.727 m (5\' 8" )   SpO2 97% Comment: RA  BMI 23.66 kg/m   Physical Exam Vitals signs and nursing note reviewed.  Constitutional:      General: She is not in acute distress.    Appearance: She is well-developed.     Comments: Elderly frail, alert  HENT:     Head: Normocephalic.     Comments: Laceration right temporal region, dried blood in the hair    Right Ear: External ear normal.     Left Ear: External ear normal.  Eyes:     General: No scleral icterus.       Right eye: No discharge.        Left eye: No discharge.     Conjunctiva/sclera: Conjunctivae normal.  Neck:     Musculoskeletal: Neck supple.     Trachea: No tracheal deviation.  Cardiovascular:     Rate and Rhythm: Normal rate and regular rhythm.  Pulmonary:     Effort: Pulmonary effort is normal. No respiratory distress.     Breath sounds: Normal breath sounds. No stridor. No wheezing or rales.  Abdominal:     General: Bowel sounds are normal. There is no distension.     Palpations: Abdomen is soft.     Tenderness: There is no abdominal tenderness. There is no guarding or rebound.  Musculoskeletal:        General: No tenderness.  Skin:    General: Skin is warm and dry.     Findings: No rash.  Neurological:     Cranial Nerves: No cranial nerve deficit (no facial droop, extraocular movements intact, no slurred speech).     Sensory: No sensory deficit.     Motor: No abnormal muscle tone or seizure activity.     Coordination:  Coordination normal.     Comments: Patient moves all extremities      ED Treatments / Results  Labs (all labs ordered are listed, but only abnormal results are displayed) Labs Reviewed  CBC  BASIC METABOLIC PANEL  URINALYSIS, ROUTINE W REFLEX MICROSCOPIC    EKG EKG Interpretation  Date/Time:  Sunday May 08 2019 13:33:38 EDT Ventricular Rate:  62 PR Interval:    QRS Duration: 96 QT  Interval:  424 QTC Calculation: 431 R Axis:   -56 Text Interpretation:  Sinus rhythm Prolonged PR interval Left anterior fascicular block Low voltage, precordial leads Baseline wander in lead(s) V3 V6 No significant change since last tracing Confirmed by Dorie Rank [phone]) on 05/08/2019 2:01:53 PM Also confirmed by Dorie Rank [phone]), editor Philomena Doheny [phone])  on 05/09/2019 7:14:24 AM   Radiology Dg Chest 1 View  Result Date: 05/08/2019 CLINICAL DATA:  Unwitnessed fall. EXAM: CHEST  1 VIEW COMPARISON:  Radiographs of January 31, 2017. FINDINGS: The heart size and mediastinal contours are within normal limits. Both lungs are clear. No pneumothorax or pleural effusion is noted. Left-sided pacemaker is unchanged in position. The visualized skeletal structures are unremarkable. IMPRESSION: No active disease. Electronically Signed   By: Marijo Conception M.D.   On: 05/08/2019 14:20   Ct Head Wo Contrast  Result Date: 05/08/2019 CLINICAL DATA:  85 year old female with head and neck injury following fall. EXAM: CT HEAD WITHOUT CONTRAST CT CERVICAL SPINE WITHOUT CONTRAST TECHNIQUE: Multidetector CT imaging of the head and cervical spine was performed following the standard protocol without intravenous contrast. Multiplanar CT image reconstructions of the cervical spine were also generated. COMPARISON:  07/16/2018 and prior CTs FINDINGS: CT HEAD FINDINGS Brain: No evidence of acute infarction, hemorrhage, hydrocephalus, extra-axial collection or mass lesion/mass effect. Atrophy and chronic small-vessel white  matter ischemic changes again noted. Vascular: Carotid atherosclerotic calcifications again noted. Skull: No acute abnormality or suspicious focal lesion. A remote RIGHT zygoma fracture is noted. Sinuses/Orbits: No acute finding. Other: None. CT CERVICAL SPINE FINDINGS Alignment: Normal. Skull base and vertebrae: No acute fracture. No primary bone lesion or focal pathologic process. Soft tissues and spinal canal: No prevertebral fluid or swelling. No visible canal hematoma. Disc levels: Mild moderate multilevel degenerative disc disease/spondylosis noted, greatest at C5-6. Moderate multilevel facet arthropathy identified. Upper chest: No acute abnormality Other: None IMPRESSION: 1. No evidence of acute intracranial abnormality. Atrophy and chronic small-vessel white matter ischemic changes. 2. No static evidence of acute injury to the cervical spine. Multilevel degenerative changes. Electronically Signed   By: Margarette Canada M.D.   On: 05/08/2019 14:37   Ct Cervical Spine Wo Contrast  Result Date: 05/08/2019 CLINICAL DATA:  85 year old female with head and neck injury following fall. EXAM: CT HEAD WITHOUT CONTRAST CT CERVICAL SPINE WITHOUT CONTRAST TECHNIQUE: Multidetector CT imaging of the head and cervical spine was performed following the standard protocol without intravenous contrast. Multiplanar CT image reconstructions of the cervical spine were also generated. COMPARISON:  07/16/2018 and prior CTs FINDINGS: CT HEAD FINDINGS Brain: No evidence of acute infarction, hemorrhage, hydrocephalus, extra-axial collection or mass lesion/mass effect. Atrophy and chronic small-vessel white matter ischemic changes again noted. Vascular: Carotid atherosclerotic calcifications again noted. Skull: No acute abnormality or suspicious focal lesion. A remote RIGHT zygoma fracture is noted. Sinuses/Orbits: No acute finding. Other: None. CT CERVICAL SPINE FINDINGS Alignment: Normal. Skull base and vertebrae: No acute fracture. No  primary bone lesion or focal pathologic process. Soft tissues and spinal canal: No prevertebral fluid or swelling. No visible canal hematoma. Disc levels: Mild moderate multilevel degenerative disc disease/spondylosis noted, greatest at C5-6. Moderate multilevel facet arthropathy identified. Upper chest: No acute abnormality Other: None IMPRESSION: 1. No evidence of acute intracranial abnormality. Atrophy and chronic small-vessel white matter ischemic changes. 2. No static evidence of acute injury to the cervical spine. Multilevel degenerative changes. Electronically Signed   By: Margarette Canada M.D.   On: 05/08/2019 14:37  Dg Hips Bilat W Or Wo Pelvis 2 Views  Result Date: 05/08/2019 CLINICAL DATA:  Unwitnessed fall. EXAM: DG HIP (WITH OR WITHOUT PELVIS) 2V BILAT COMPARISON:  Radiographs of July 16, 2018. FINDINGS: There is no evidence of acute hip fracture or dislocation. There is no evidence of arthropathy. Status post surgical repair of old right proximal femoral fracture. IMPRESSION: No acute abnormality seen involving either hip. Electronically Signed   By: Marijo Conception M.D.   On: 05/08/2019 14:22    Procedures Procedures (including critical care time)  Medications Ordered in ED Medications  Tdap (BOOSTRIX) injection 0.5 mL (has no administration in time range)     Initial Impression / Assessment and Plan / ED Course  I have reviewed the triage vital signs and the nursing notes.  Pertinent labs & imaging results that were available during my care of the patient were reviewed by me and considered in my medical decision making (see chart for details).   CT scans and xrays reviewed.  No significant abnormalities noted.  Discussed findings with pt;s daughter.  Since this was an unwitnessed fall, labs ordered.  Test results pending at time of shift change.  Dr Wilson Singer will follow up on results.  Anticipate dc if negative.  Final Clinical Impressions(s) / ED Diagnoses   Final diagnoses:   Injury of head, initial encounter  Abrasion of scalp, initial encounter  Fall, initial encounter    ED Discharge Orders    None       Dorie Rank, MD 05/09/19 1200

## 2019-05-08 NOTE — ED Triage Notes (Signed)
Pt from [facility] care unit via EMS post unwitnessed fall. Pt was found on the floor lying on top of her walker. Pt has lac to right side of head. Pt is not on blood thinners. Pt is alert to her baseline which is self and situation. Pt in NAD. Pt daughter wants to be called with status, [name] [phone]

## 2019-05-08 NOTE — ED Notes (Signed)
Bed: WA20 Expected date:  Expected time:  Means of arrival:  Comments: 85 yo fall, head lac

## 2019-05-08 NOTE — ED Notes (Signed)
Patient discharged with her daughter to take her back home to the nursing home. Discharged paper work explain to the daughter and the discharged paper work given to the daughter to take back to the nursing home with the rest of the nursing home paper work that was sent with the patient. Patient was unable to sign for her discharged.

## 2019-05-08 NOTE — ED Provider Notes (Signed)
Assumed care in sign out at change of shift. Please see Dr Johnsie Kindred note for full HPI. She has an abrasion to high R temporal region. Cleaned. Some mild oozing but nothing that is amendable to closure. Should be fine with local wound care. Unwitnessed fall. EKG w/o overt concerning findings. Imaging ok. Labs unrevealing. Discussed with her daughter [name] who will be coming back to pick pt up.    Virgel Manifold, MD 05/08/19 [phone]

## 2019-05-08 NOTE — ED Notes (Signed)
Patient refused a temperature check because "everything hurts too much"

## 2019-05-08 NOTE — ED Notes (Signed)
Patient transported to CT 

## 2019-05-10 ENCOUNTER — Telehealth: Payer: Self-pay

## 2019-05-10 DIAGNOSIS — M6281 Muscle weakness (generalized): Secondary | ICD-10-CM | POA: Diagnosis not present

## 2019-05-10 DIAGNOSIS — R52 Pain, unspecified: Secondary | ICD-10-CM | POA: Diagnosis not present

## 2019-05-10 DIAGNOSIS — R488 Other symbolic dysfunctions: Secondary | ICD-10-CM | POA: Diagnosis not present

## 2019-05-10 DIAGNOSIS — R2689 Other abnormalities of gait and mobility: Secondary | ICD-10-CM | POA: Diagnosis not present

## 2019-05-10 DIAGNOSIS — M79601 Pain in right arm: Secondary | ICD-10-CM | POA: Diagnosis not present

## 2019-05-10 DIAGNOSIS — R296 Repeated falls: Secondary | ICD-10-CM | POA: Diagnosis not present

## 2019-05-10 NOTE — Telephone Encounter (Signed)
Pt daughter states that she have not done a transmission on the pt for a long time and need instructions to send transmission mailed to her.

## 2019-05-12 DIAGNOSIS — M79601 Pain in right arm: Secondary | ICD-10-CM | POA: Diagnosis not present

## 2019-05-12 DIAGNOSIS — R52 Pain, unspecified: Secondary | ICD-10-CM | POA: Diagnosis not present

## 2019-05-12 DIAGNOSIS — M6281 Muscle weakness (generalized): Secondary | ICD-10-CM | POA: Diagnosis not present

## 2019-05-12 DIAGNOSIS — R296 Repeated falls: Secondary | ICD-10-CM | POA: Diagnosis not present

## 2019-05-12 DIAGNOSIS — R2689 Other abnormalities of gait and mobility: Secondary | ICD-10-CM | POA: Diagnosis not present

## 2019-05-12 DIAGNOSIS — R488 Other symbolic dysfunctions: Secondary | ICD-10-CM | POA: Diagnosis not present

## 2019-05-13 DIAGNOSIS — R52 Pain, unspecified: Secondary | ICD-10-CM | POA: Diagnosis not present

## 2019-05-13 DIAGNOSIS — R488 Other symbolic dysfunctions: Secondary | ICD-10-CM | POA: Diagnosis not present

## 2019-05-13 DIAGNOSIS — M79601 Pain in right arm: Secondary | ICD-10-CM | POA: Diagnosis not present

## 2019-05-13 DIAGNOSIS — R296 Repeated falls: Secondary | ICD-10-CM | POA: Diagnosis not present

## 2019-05-13 DIAGNOSIS — M6281 Muscle weakness (generalized): Secondary | ICD-10-CM | POA: Diagnosis not present

## 2019-05-13 DIAGNOSIS — R2689 Other abnormalities of gait and mobility: Secondary | ICD-10-CM | POA: Diagnosis not present

## 2019-05-16 DIAGNOSIS — R488 Other symbolic dysfunctions: Secondary | ICD-10-CM | POA: Diagnosis not present

## 2019-05-16 DIAGNOSIS — M79601 Pain in right arm: Secondary | ICD-10-CM | POA: Diagnosis not present

## 2019-05-16 DIAGNOSIS — R296 Repeated falls: Secondary | ICD-10-CM | POA: Diagnosis not present

## 2019-05-16 DIAGNOSIS — M6281 Muscle weakness (generalized): Secondary | ICD-10-CM | POA: Diagnosis not present

## 2019-05-16 DIAGNOSIS — R2689 Other abnormalities of gait and mobility: Secondary | ICD-10-CM | POA: Diagnosis not present

## 2019-05-16 DIAGNOSIS — R52 Pain, unspecified: Secondary | ICD-10-CM | POA: Diagnosis not present

## 2019-05-19 DIAGNOSIS — Z20828 Contact with and (suspected) exposure to other viral communicable diseases: Secondary | ICD-10-CM | POA: Diagnosis not present

## 2019-05-20 ENCOUNTER — Other Ambulatory Visit: Payer: Self-pay

## 2019-05-20 ENCOUNTER — Inpatient Hospital Stay (HOSPITAL_COMMUNITY)
Admission: EM | Admit: 2019-05-20 | Discharge: 2019-05-30 | DRG: 177 | Disposition: A | Discharge status: Skilled Nursing Facility | Payer: Medicare Other | Attending: Internal Medicine | Admitting: Internal Medicine

## 2019-05-20 ENCOUNTER — Emergency Department (HOSPITAL_COMMUNITY): Payer: Medicare Other

## 2019-05-20 ENCOUNTER — Encounter (HOSPITAL_COMMUNITY): Payer: Self-pay | Admitting: Family Medicine

## 2019-05-20 DIAGNOSIS — G92 Toxic encephalopathy: Secondary | ICD-10-CM | POA: Diagnosis not present

## 2019-05-20 DIAGNOSIS — R4781 Slurred speech: Secondary | ICD-10-CM | POA: Diagnosis present

## 2019-05-20 DIAGNOSIS — Z7984 Long term (current) use of oral hypoglycemic drugs: Secondary | ICD-10-CM

## 2019-05-20 DIAGNOSIS — Z79899 Other long term (current) drug therapy: Secondary | ICD-10-CM

## 2019-05-20 DIAGNOSIS — G9340 Encephalopathy, unspecified: Secondary | ICD-10-CM | POA: Diagnosis not present

## 2019-05-20 DIAGNOSIS — J440 Chronic obstructive pulmonary disease with acute lower respiratory infection: Secondary | ICD-10-CM | POA: Diagnosis present

## 2019-05-20 DIAGNOSIS — S0990XA Unspecified injury of head, initial encounter: Secondary | ICD-10-CM | POA: Diagnosis not present

## 2019-05-20 DIAGNOSIS — G301 Alzheimer's disease with late onset: Secondary | ICD-10-CM | POA: Diagnosis not present

## 2019-05-20 DIAGNOSIS — R109 Unspecified abdominal pain: Secondary | ICD-10-CM | POA: Diagnosis not present

## 2019-05-20 DIAGNOSIS — R51 Headache: Secondary | ICD-10-CM | POA: Diagnosis not present

## 2019-05-20 DIAGNOSIS — E1165 Type 2 diabetes mellitus with hyperglycemia: Secondary | ICD-10-CM | POA: Diagnosis present

## 2019-05-20 DIAGNOSIS — M542 Cervicalgia: Secondary | ICD-10-CM | POA: Diagnosis not present

## 2019-05-20 DIAGNOSIS — N390 Urinary tract infection, site not specified: Secondary | ICD-10-CM | POA: Diagnosis present

## 2019-05-20 DIAGNOSIS — E876 Hypokalemia: Secondary | ICD-10-CM | POA: Diagnosis present

## 2019-05-20 DIAGNOSIS — E1142 Type 2 diabetes mellitus with diabetic polyneuropathy: Secondary | ICD-10-CM | POA: Diagnosis present

## 2019-05-20 DIAGNOSIS — J438 Other emphysema: Secondary | ICD-10-CM | POA: Diagnosis not present

## 2019-05-20 DIAGNOSIS — Z1159 Encounter for screening for other viral diseases: Secondary | ICD-10-CM

## 2019-05-20 DIAGNOSIS — R0602 Shortness of breath: Secondary | ICD-10-CM

## 2019-05-20 DIAGNOSIS — Z882 Allergy status to sulfonamides status: Secondary | ICD-10-CM

## 2019-05-20 DIAGNOSIS — I4430 Unspecified atrioventricular block: Secondary | ICD-10-CM | POA: Diagnosis not present

## 2019-05-20 DIAGNOSIS — N183 Chronic kidney disease, stage 3 unspecified: Secondary | ICD-10-CM

## 2019-05-20 DIAGNOSIS — J449 Chronic obstructive pulmonary disease, unspecified: Secondary | ICD-10-CM | POA: Diagnosis present

## 2019-05-20 DIAGNOSIS — U071 COVID-19: Principal | ICD-10-CM | POA: Diagnosis present

## 2019-05-20 DIAGNOSIS — E7800 Pure hypercholesterolemia, unspecified: Secondary | ICD-10-CM | POA: Diagnosis present

## 2019-05-20 DIAGNOSIS — I440 Atrioventricular block, first degree: Secondary | ICD-10-CM | POA: Diagnosis not present

## 2019-05-20 DIAGNOSIS — Z880 Allergy status to penicillin: Secondary | ICD-10-CM

## 2019-05-20 DIAGNOSIS — F0281 Dementia in other diseases classified elsewhere with behavioral disturbance: Secondary | ICD-10-CM | POA: Diagnosis present

## 2019-05-20 DIAGNOSIS — Z833 Family history of diabetes mellitus: Secondary | ICD-10-CM

## 2019-05-20 DIAGNOSIS — S199XXA Unspecified injury of neck, initial encounter: Secondary | ICD-10-CM | POA: Diagnosis not present

## 2019-05-20 DIAGNOSIS — J1289 Other viral pneumonia: Secondary | ICD-10-CM | POA: Diagnosis not present

## 2019-05-20 DIAGNOSIS — R410 Disorientation, unspecified: Secondary | ICD-10-CM | POA: Diagnosis not present

## 2019-05-20 DIAGNOSIS — Z87891 Personal history of nicotine dependence: Secondary | ICD-10-CM

## 2019-05-20 DIAGNOSIS — Z7951 Long term (current) use of inhaled steroids: Secondary | ICD-10-CM

## 2019-05-20 DIAGNOSIS — S3993XA Unspecified injury of pelvis, initial encounter: Secondary | ICD-10-CM | POA: Diagnosis not present

## 2019-05-20 DIAGNOSIS — G309 Alzheimer's disease, unspecified: Secondary | ICD-10-CM | POA: Diagnosis present

## 2019-05-20 DIAGNOSIS — Z888 Allergy status to other drugs, medicaments and biological substances status: Secondary | ICD-10-CM

## 2019-05-20 DIAGNOSIS — F02818 Dementia in other diseases classified elsewhere, unspecified severity, with other behavioral disturbance: Secondary | ICD-10-CM

## 2019-05-20 DIAGNOSIS — R471 Dysarthria and anarthria: Secondary | ICD-10-CM | POA: Diagnosis present

## 2019-05-20 DIAGNOSIS — E860 Dehydration: Secondary | ICD-10-CM | POA: Diagnosis not present

## 2019-05-20 DIAGNOSIS — R21 Rash and other nonspecific skin eruption: Secondary | ICD-10-CM | POA: Diagnosis not present

## 2019-05-20 DIAGNOSIS — Z950 Presence of cardiac pacemaker: Secondary | ICD-10-CM

## 2019-05-20 DIAGNOSIS — R404 Transient alteration of awareness: Secondary | ICD-10-CM | POA: Diagnosis not present

## 2019-05-20 DIAGNOSIS — R9431 Abnormal electrocardiogram [ECG] [EKG]: Secondary | ICD-10-CM | POA: Diagnosis not present

## 2019-05-20 DIAGNOSIS — S3991XA Unspecified injury of abdomen, initial encounter: Secondary | ICD-10-CM | POA: Diagnosis not present

## 2019-05-20 DIAGNOSIS — R4182 Altered mental status, unspecified: Secondary | ICD-10-CM | POA: Diagnosis not present

## 2019-05-20 DIAGNOSIS — R627 Adult failure to thrive: Secondary | ICD-10-CM | POA: Diagnosis present

## 2019-05-20 DIAGNOSIS — Z7983 Long term (current) use of bisphosphonates: Secondary | ICD-10-CM

## 2019-05-20 DIAGNOSIS — F329 Major depressive disorder, single episode, unspecified: Secondary | ICD-10-CM | POA: Diagnosis present

## 2019-05-20 DIAGNOSIS — Z66 Do not resuscitate: Secondary | ICD-10-CM | POA: Diagnosis present

## 2019-05-20 DIAGNOSIS — K047 Periapical abscess without sinus: Secondary | ICD-10-CM | POA: Diagnosis present

## 2019-05-20 LAB — URINALYSIS, COMPLETE (UACMP) WITH MICROSCOPIC
Bilirubin Urine: NEGATIVE
Glucose, UA: 150 mg/dL — AB
Hgb urine dipstick: NEGATIVE
Ketones, ur: 20 mg/dL — AB
Leukocytes,Ua: NEGATIVE
Nitrite: NEGATIVE
Protein, ur: NEGATIVE mg/dL
Specific Gravity, Urine: 1.038 — ABNORMAL HIGH (ref 1.005–1.030)
pH: 5 (ref 5.0–8.0)

## 2019-05-20 LAB — CBG MONITORING, ED: Glucose-Capillary: 229 mg/dL — ABNORMAL HIGH (ref 70–99)

## 2019-05-20 LAB — CBC WITH DIFFERENTIAL/PLATELET
Abs Immature Granulocytes: 0.07 10*3/uL (ref 0.00–0.07)
Basophils Absolute: 0 10*3/uL (ref 0.0–0.1)
Basophils Relative: 0 %
Eosinophils Absolute: 0.1 10*3/uL (ref 0.0–0.5)
Eosinophils Relative: 1 %
HCT: 38.8 % (ref 36.0–46.0)
Hemoglobin: 12.8 g/dL (ref 12.0–15.0)
Immature Granulocytes: 1 %
Lymphocytes Relative: 21 %
Lymphs Abs: 2.4 10*3/uL (ref 0.7–4.0)
MCH: 29.9 pg (ref 26.0–34.0)
MCHC: 33 g/dL (ref 30.0–36.0)
MCV: 90.7 fL (ref 80.0–100.0)
Monocytes Absolute: 1 10*3/uL (ref 0.1–1.0)
Monocytes Relative: 8 %
Neutro Abs: 7.9 10*3/uL — ABNORMAL HIGH (ref 1.7–7.7)
Neutrophils Relative %: 69 %
Platelets: 393 10*3/uL (ref 150–400)
RBC: 4.28 MIL/uL (ref 3.87–5.11)
RDW: 12 % (ref 11.5–15.5)
WBC: 11.4 10*3/uL — ABNORMAL HIGH (ref 4.0–10.5)
nRBC: 0 % (ref 0.0–0.2)

## 2019-05-20 LAB — RAPID URINE DRUG SCREEN, HOSP PERFORMED
Amphetamines: NOT DETECTED
Barbiturates: NOT DETECTED
Benzodiazepines: POSITIVE — AB
Cocaine: NOT DETECTED
Opiates: NOT DETECTED
Tetrahydrocannabinol: NOT DETECTED

## 2019-05-20 LAB — COMPREHENSIVE METABOLIC PANEL
ALT: 14 U/L (ref 0–44)
AST: 14 U/L — ABNORMAL LOW (ref 15–41)
Albumin: 3.1 g/dL — ABNORMAL LOW (ref 3.5–5.0)
Alkaline Phosphatase: 83 U/L (ref 38–126)
Anion gap: 12 (ref 5–15)
BUN: 28 mg/dL — ABNORMAL HIGH (ref 8–23)
CO2: 25 mmol/L (ref 22–32)
Calcium: 10.1 mg/dL (ref 8.9–10.3)
Chloride: 107 mmol/L (ref 98–111)
Creatinine, Ser: 0.9 mg/dL (ref 0.44–1.00)
GFR calc Af Amer: >60 mL/min (ref >60)
GFR calc non Af Amer: 59 mL/min — ABNORMAL LOW (ref >60)
Glucose, Bld: 231 mg/dL — ABNORMAL HIGH (ref 70–99)
Potassium: 3.6 mmol/L (ref 3.5–5.1)
Sodium: 144 mmol/L (ref 135–145)
Total Bilirubin: 0.6 mg/dL (ref 0.3–1.2)
Total Protein: 6.7 g/dL (ref 6.5–8.1)

## 2019-05-20 LAB — LACTIC ACID, PLASMA: Lactic Acid, Venous: 1.4 mmol/L (ref 0.5–1.9)

## 2019-05-20 LAB — PROTIME-INR
INR: 1.1 (ref 0.8–1.2)
Prothrombin Time: 14.1 seconds (ref 11.4–15.2)

## 2019-05-20 LAB — TSH: TSH: 2.207 u[IU]/mL (ref 0.350–4.500)

## 2019-05-20 MED ORDER — LACTATED RINGERS IV BOLUS
1000.0000 mL | Freq: Once | INTRAVENOUS | Status: AC
  Administered 2019-05-20: 1000 mL via INTRAVENOUS

## 2019-05-20 MED ORDER — SODIUM CHLORIDE 0.9 % IV SOLN: INTRAVENOUS | Status: DC

## 2019-05-20 MED ORDER — ENOXAPARIN SODIUM 40 MG/0.4ML ~~LOC~~ SOLN
40.0000 mg | SUBCUTANEOUS | Status: DC
  Administered 2019-05-21 – 2019-05-23 (×3): 40 mg via SUBCUTANEOUS
  Filled 2019-05-20 (×3): qty 0.4

## 2019-05-20 MED ORDER — INSULIN ASPART 100 UNIT/ML ~~LOC~~ SOLN
0.0000 [IU] | Freq: Three times a day (TID) | SUBCUTANEOUS | Status: DC
  Administered 2019-05-21: 3 [IU] via SUBCUTANEOUS
  Administered 2019-05-21 – 2019-05-22 (×2): 2 [IU] via SUBCUTANEOUS
  Administered 2019-05-24 (×2): 1 [IU] via SUBCUTANEOUS
  Administered 2019-05-24: 2 [IU] via SUBCUTANEOUS
  Administered 2019-05-25 (×2): 1 [IU] via SUBCUTANEOUS
  Administered 2019-05-25: 7 [IU] via SUBCUTANEOUS
  Administered 2019-05-26: 3 [IU] via SUBCUTANEOUS
  Administered 2019-05-27: 18:00:00 7 [IU] via SUBCUTANEOUS
  Administered 2019-05-27 – 2019-05-30 (×6): 2 [IU] via SUBCUTANEOUS

## 2019-05-20 MED ORDER — ONDANSETRON HCL 4 MG PO TABS: 4.0000 mg | ORAL_TABLET | Freq: Four times a day (QID) | ORAL | Status: DC | PRN

## 2019-05-20 MED ORDER — INSULIN ASPART 100 UNIT/ML ~~LOC~~ SOLN
0.0000 [IU] | Freq: Every day | SUBCUTANEOUS | Status: DC
  Administered 2019-05-26 – 2019-05-27 (×2): 5 [IU] via SUBCUTANEOUS

## 2019-05-20 MED ORDER — MOMETASONE FURO-FORMOTEROL FUM 200-5 MCG/ACT IN AERO
2.0000 | INHALATION_SPRAY | Freq: Two times a day (BID) | RESPIRATORY_TRACT | Status: DC
  Administered 2019-05-21 – 2019-05-22 (×3): 2 via RESPIRATORY_TRACT
  Filled 2019-05-20 (×2): qty 8.8

## 2019-05-20 MED ORDER — MORPHINE SULFATE (PF) 4 MG/ML IV SOLN
4.0000 mg | Freq: Once | INTRAVENOUS | Status: DC
  Filled 2019-05-20: qty 1

## 2019-05-20 MED ORDER — SENNOSIDES-DOCUSATE SODIUM 8.6-50 MG PO TABS: 1.0000 | ORAL_TABLET | Freq: Every evening | ORAL | Status: DC | PRN

## 2019-05-20 MED ORDER — ACETAMINOPHEN 650 MG RE SUPP: 650.0000 mg | Freq: Four times a day (QID) | RECTAL | Status: DC | PRN

## 2019-05-20 MED ORDER — ACETAMINOPHEN 325 MG PO TABS
650.0000 mg | ORAL_TABLET | Freq: Four times a day (QID) | ORAL | Status: DC | PRN
  Administered 2019-05-27 – 2019-05-29 (×2): 650 mg via ORAL
  Filled 2019-05-20 (×3): qty 2

## 2019-05-20 MED ORDER — SODIUM CHLORIDE 0.9% FLUSH
3.0000 mL | Freq: Two times a day (BID) | INTRAVENOUS | Status: DC
  Administered 2019-05-21 – 2019-05-30 (×12): 3 mL via INTRAVENOUS

## 2019-05-20 MED ORDER — HYDROCODONE-ACETAMINOPHEN 5-325 MG PO TABS
1.0000 | ORAL_TABLET | ORAL | Status: DC | PRN
  Administered 2019-05-22: 1 via ORAL
  Filled 2019-05-20 (×2): qty 1

## 2019-05-20 MED ORDER — IOHEXOL 300 MG/ML  SOLN
100.0000 mL | Freq: Once | INTRAMUSCULAR | Status: AC | PRN
  Administered 2019-05-20: 100 mL via INTRAVENOUS

## 2019-05-20 MED ORDER — LORAZEPAM 0.5 MG PO TABS
0.5000 mg | ORAL_TABLET | Freq: Two times a day (BID) | ORAL | Status: DC | PRN
  Administered 2019-05-23: 0.5 mg via ORAL
  Filled 2019-05-20: qty 1

## 2019-05-20 MED ORDER — ONDANSETRON HCL 4 MG/2ML IJ SOLN: 4.0000 mg | Freq: Four times a day (QID) | INTRAMUSCULAR | Status: DC | PRN

## 2019-05-20 MED ORDER — ALBUTEROL SULFATE HFA 108 (90 BASE) MCG/ACT IN AERS
1.0000 | INHALATION_SPRAY | Freq: Four times a day (QID) | RESPIRATORY_TRACT | Status: DC | PRN
  Administered 2019-05-22: 2 via RESPIRATORY_TRACT
  Filled 2019-05-20: qty 6.7

## 2019-05-20 NOTE — ED Notes (Signed)
ED TO INPATIENT HANDOFF REPORT  ED Nurse Name and Phone #: [phone]  S Name/Age/Gender Arta Silence 85 y.o. female Room/Bed: 022C/022C  Code Status   Code Status: Prior  Home/SNF/Other Nursing Home Patient oriented to: self Is this baseline? No   Triage Complete: Triage complete  Chief Complaint generalize weakness   Triage Note [facility] reports that patient had a fall on May 10th but no injures were reported. She is complaining of neck pain since 5/13. She has had altered mental status, right sided weakness and slurred speech for 2 days.    Allergies Allergies  Allergen Reactions  . Aricept [Donepezil] Other (See Comments)    Leg cramps  . Exelon [Rivastigmine] Other (See Comments)    dizziness and sad  . Namenda [Memantine] Other (See Comments)    dizziness  . Penicillins     "pt does not know its been so long" Has patient had a PCN reaction causing immediate rash, facial/tongue/throat swelling, SOB or lightheadedness with hypotension: Unknown Has patient had a PCN reaction causing severe rash involving mucus membranes or skin necrosis: Unknown Has patient had a PCN reaction that required hospitalization: Unknown Has patient had a PCN reaction occurring within the last 10 years: Unknown If all of the above answers are "NO", then may proceed with Cephalosporin use.   . Statins Other (See Comments)    unknown  . Sulfa Antibiotics Other (See Comments)    Unknown  . Wellbutrin [Bupropion] Other (See Comments)    Low sodium    Level of Care/Admitting Diagnosis ED Disposition    ED Disposition Condition Comment   Admit  Hospital Area: [facility] [[id number]]  Level of Care: Telemetry Medical [104]  I expect the patient will be discharged within 24 hours: No (not a candidate for 5C-Observation unit)  Covid Evaluation: Screening Protocol (No Symptoms)  Diagnosis: Acute encephalopathy [831517]  Admitting Physician: Vianne Bulls [[id number]]  Attending  Physician: Vianne Bulls [[id number]]  PT Class (Do Not Modify): Observation [104]  PT Acc Code (Do Not Modify): Observation [10022]       B Medical/Surgery History Past Medical History:  Diagnosis Date  . Alzheimer disease ([place])   . Bronchitis   . COPD (chronic obstructive pulmonary disease) ([place])   . Depression   . Diabetes ([place])   . Hypercholesteremia   . Memory loss   . Pacemaker    [employer] DOI 2011  . Peripheral neuropathy   . Renal disease   . Shoulder pain, left   . Syncope    Past Surgical History:  Procedure Laterality Date  . HIP PINNING,CANNULATED Right 04/26/2016   Procedure: CANNULATED HIP PINNING;  Surgeon: Tania Ade, MD;  Location: [facility];  Service: Orthopedics;  Laterality: Right;  . no surgical history    . PACEMAKER PLACEMENT       A IV Location/Drains/Wounds Patient Lines/Drains/Airways Status   Active Line/Drains/Airways    Name:   Placement date:   Placement time:   Site:   Days:   Peripheral IV 05/20/19 Left Antecubital   05/20/19    -    Antecubital   less than 1          Intake/Output Last 24 hours No intake or output data in the 24 hours ending 05/20/19 2252  Labs/Imaging Results for orders placed or performed during the hospital encounter of 05/20/19 (from the past 48 hour(s))  CBG monitoring, ED     Status: Abnormal   Collection Time: 05/20/19  3:29 PM  Result Value Ref Range   Glucose-Capillary 229 (H) 70 - 99 mg/dL  Lactic acid, plasma     Status: None   Collection Time: 05/20/19  4:00 PM  Result Value Ref Range   Lactic Acid, Venous 1.4 0.5 - 1.9 mmol/L    Comment: Performed at [facility] [address], [place], [place] [address]  TSH     Status: None   Collection Time: 05/20/19  4:00 PM  Result Value Ref Range   TSH 2.207 0.350 - 4.500 uIU/mL    Comment: Performed by a 3rd Generation assay with a functional sensitivity of <=0.01 uIU/mL. Performed at [facility], [place] [address], [place], [place]  [address]   Comprehensive metabolic panel     Status: Abnormal   Collection Time: 05/20/19  4:15 PM  Result Value Ref Range   Sodium 144 135 - 145 mmol/L   Potassium 3.6 3.5 - 5.1 mmol/L   Chloride 107 98 - 111 mmol/L   CO2 25 22 - 32 mmol/L   Glucose, Bld 231 (H) 70 - 99 mg/dL   BUN 28 (H) 8 - 23 mg/dL   Creatinine, Ser 0.90 0.44 - 1.00 mg/dL   Calcium 10.1 8.9 - 10.3 mg/dL   Total Protein 6.7 6.5 - 8.1 g/dL   Albumin 3.1 (L) 3.5 - 5.0 g/dL   AST 14 (L) 15 - 41 U/L   ALT 14 0 - 44 U/L   Alkaline Phosphatase 83 38 - 126 U/L   Total Bilirubin 0.6 0.3 - 1.2 mg/dL   GFR calc non Af Amer 59 (L) >60 mL/min   GFR calc Af Amer >60 >60 mL/min   Anion gap 12 5 - 15    Comment: Performed at [facility] [address], [place], [place] [address]  CBC WITH DIFFERENTIAL     Status: Abnormal   Collection Time: 05/20/19  4:15 PM  Result Value Ref Range   WBC 11.4 (H) 4.0 - 10.5 K/uL   RBC 4.28 3.87 - 5.11 MIL/uL   Hemoglobin 12.8 12.0 - 15.0 g/dL   HCT 38.8 36.0 - 46.0 %   MCV 90.7 80.0 - 100.0 fL   MCH 29.9 26.0 - 34.0 pg   MCHC 33.0 30.0 - 36.0 g/dL   RDW 12.0 11.5 - 15.5 %   Platelets 393 150 - 400 K/uL   nRBC 0.0 0.0 - 0.2 %   Neutrophils Relative % 69 %   Neutro Abs 7.9 (H) 1.7 - 7.7 K/uL   Lymphocytes Relative 21 %   Lymphs Abs 2.4 0.7 - 4.0 K/uL   Monocytes Relative 8 %   Monocytes Absolute 1.0 0.1 - 1.0 K/uL   Eosinophils Relative 1 %   Eosinophils Absolute 0.1 0.0 - 0.5 K/uL   Basophils Relative 0 %   Basophils Absolute 0.0 0.0 - 0.1 K/uL   Immature Granulocytes 1 %   Abs Immature Granulocytes 0.07 0.00 - 0.07 K/uL    Comment: Performed at [facility] [address] [place], [place] [address]  Protime-INR     Status: None   Collection Time: 05/20/19  4:15 PM  Result Value Ref Range   Prothrombin Time 14.1 11.4 - 15.2 seconds   INR 1.1 0.8 - 1.2    Comment: (NOTE) INR goal varies based on device and disease states. Performed at [facility],  [place] [address] [place],  [address]   Urinalysis, Complete w  Microscopic     Status: Abnormal   Collection Time: 05/20/19  9:32 PM  Result Value Ref Range   Color, Urine YELLOW YELLOW   APPearance HAZY (A) CLEAR   Specific Gravity, Urine 1.038 (H) 1.005 - 1.030   pH 5.0 5.0 - 8.0   Glucose, UA 150 (A) NEGATIVE mg/dL   Hgb urine dipstick NEGATIVE NEGATIVE   Bilirubin Urine NEGATIVE NEGATIVE   Ketones, ur 20 (A) NEGATIVE mg/dL   Protein, ur NEGATIVE NEGATIVE mg/dL   Nitrite NEGATIVE NEGATIVE   Leukocytes,Ua NEGATIVE NEGATIVE   Bacteria, UA FEW (A) NONE SEEN   Mucus PRESENT    Hyaline Casts, UA PRESENT    Uric Acid Crys, UA PRESENT     Comment: Performed at [facility] [address] [place], [place] [address]  Rapid urine drug screen (hospital performed)     Status: Abnormal   Collection Time: 05/20/19  9:32 PM  Result Value Ref Range   Opiates NONE DETECTED NONE DETECTED   Cocaine NONE DETECTED NONE DETECTED   Benzodiazepines POSITIVE (A) NONE DETECTED   Amphetamines NONE DETECTED NONE DETECTED   Tetrahydrocannabinol NONE DETECTED NONE DETECTED   Barbiturates NONE DETECTED NONE DETECTED    Comment: (NOTE) DRUG SCREEN FOR MEDICAL PURPOSES ONLY.  IF CONFIRMATION IS NEEDED FOR ANY PURPOSE, NOTIFY LAB WITHIN 5 DAYS. LOWEST DETECTABLE LIMITS FOR URINE DRUG SCREEN Drug Class                     Cutoff (ng/mL) Amphetamine and metabolites    1000 Barbiturate and metabolites    200 Benzodiazepine                 170 Tricyclics and metabolites     300 Opiates and metabolites        300 Cocaine and metabolites        300 THC                            50 Performed at [facility], [place] [address] [place], [place] [address]    Ct Head Wo Contrast  Result Date: 05/20/2019 CLINICAL DATA:  Fall neck pain and altered mental status EXAM: CT HEAD WITHOUT CONTRAST CT CERVICAL SPINE WITHOUT CONTRAST TECHNIQUE: Multidetector CT imaging of the head and cervical spine was  performed following the standard protocol without intravenous contrast. Multiplanar CT image reconstructions of the cervical spine were also generated. COMPARISON:  CT head and cervical spine 05/08/2019 FINDINGS: CT HEAD FINDINGS Brain: There is no mass, hemorrhage or extra-axial collection. There is generalized atrophy without lobar predilection. There is hypoattenuation of the periventricular white matter, most commonly indicating chronic ischemic microangiopathy. Vascular: No abnormal hyperdensity of the major intracranial arteries or dural venous sinuses. No intracranial atherosclerosis. Skull: The visualized skull base, calvarium and extracranial soft tissues are normal. Sinuses/Orbits: No fluid levels or advanced mucosal thickening of the visualized paranasal sinuses. No mastoid or middle ear effusion. The orbits are normal. CT CERVICAL SPINE FINDINGS Alignment: Grade 1 anterolisthesis at C4-5. Skull base and vertebrae: No acute fracture. Soft tissues and spinal canal: No prevertebral fluid or swelling. No visible canal hematoma. Disc levels: Multilevel moderate-to-severe facet hypertrophy. Upper chest: No pneumothorax, pulmonary nodule or pleural effusion. Other: Normal visualized paraspinal cervical soft tissues. IMPRESSION: 1. No acute abnormality of the head or cervical spine. 2. Chronic ischemic microangiopathy and generalized volume loss. 3. Multilevel cervical facet arthrosis with  grade 1 anterolisthesis at C4-5, unchanged. Electronically Signed   By: Ulyses Jarred M.D.   On: 05/20/2019 20:30   Ct Cervical Spine Wo Contrast  Result Date: 05/20/2019 CLINICAL DATA:  Fall neck pain and altered mental status EXAM: CT HEAD WITHOUT CONTRAST CT CERVICAL SPINE WITHOUT CONTRAST TECHNIQUE: Multidetector CT imaging of the head and cervical spine was performed following the standard protocol without intravenous contrast. Multiplanar CT image reconstructions of the cervical spine were also generated. COMPARISON:   CT head and cervical spine 05/08/2019 FINDINGS: CT HEAD FINDINGS Brain: There is no mass, hemorrhage or extra-axial collection. There is generalized atrophy without lobar predilection. There is hypoattenuation of the periventricular white matter, most commonly indicating chronic ischemic microangiopathy. Vascular: No abnormal hyperdensity of the major intracranial arteries or dural venous sinuses. No intracranial atherosclerosis. Skull: The visualized skull base, calvarium and extracranial soft tissues are normal. Sinuses/Orbits: No fluid levels or advanced mucosal thickening of the visualized paranasal sinuses. No mastoid or middle ear effusion. The orbits are normal. CT CERVICAL SPINE FINDINGS Alignment: Grade 1 anterolisthesis at C4-5. Skull base and vertebrae: No acute fracture. Soft tissues and spinal canal: No prevertebral fluid or swelling. No visible canal hematoma. Disc levels: Multilevel moderate-to-severe facet hypertrophy. Upper chest: No pneumothorax, pulmonary nodule or pleural effusion. Other: Normal visualized paraspinal cervical soft tissues. IMPRESSION: 1. No acute abnormality of the head or cervical spine. 2. Chronic ischemic microangiopathy and generalized volume loss. 3. Multilevel cervical facet arthrosis with grade 1 anterolisthesis at C4-5, unchanged. Electronically Signed   By: Ulyses Jarred M.D.   On: 05/20/2019 20:30   Ct Abdomen Pelvis W Contrast  Result Date: 05/20/2019 CLINICAL DATA:  Fall several days ago with persistent weakness and abdominal pain EXAM: CT ABDOMEN AND PELVIS WITH CONTRAST TECHNIQUE: Multidetector CT imaging of the abdomen and pelvis was performed using the standard protocol following bolus administration of intravenous contrast. CONTRAST:  136mL OMNIPAQUE IOHEXOL 300 MG/ML  SOLN COMPARISON:  None. FINDINGS: Lower chest: No acute abnormality. Hepatobiliary: No focal liver abnormality is seen. No gallstones, gallbladder wall thickening, or biliary dilatation.  Pancreas: Unremarkable. No pancreatic ductal dilatation or surrounding inflammatory changes. Spleen: Normal in size without focal abnormality. Adrenals/Urinary Tract: Adrenal glands are within normal limits. The kidneys are well visualized bilaterally. No renal calculi or obstructive changes are seen. Normal excretion is noted bilaterally. Stomach/Bowel: Mild diverticular changes noted. There are changes suggestive of prior appendectomy. Clinical correlation is recommended. No inflammatory changes are seen. The stomach and small bowel appear within normal limits. Vascular/Lymphatic: Aortic atherosclerosis. No enlarged abdominal or pelvic lymph nodes. Reproductive: Status post hysterectomy. No adnexal masses. Other: No abdominal wall hernia or abnormality. No abdominopelvic ascites. Musculoskeletal: Postsurgical changes are noted in the right hip. No acute bony abnormality is noted. IMPRESSION: Diverticulosis without diverticulitis. No acute abnormality noted. Electronically Signed   By: Inez Catalina M.D.   On: 05/20/2019 20:29   Dg Chest Port 1 View  Result Date: 05/20/2019 CLINICAL DATA:  Altered mental status. EXAM: PORTABLE CHEST 1 VIEW COMPARISON:  Radiograph of May 08, 2019. FINDINGS: The heart size and mediastinal contours are within normal limits. Both lungs are clear. No pneumothorax or pleural effusion is noted. Left-sided pacemaker is unchanged in position. The visualized skeletal structures are unremarkable. IMPRESSION: No active disease. Electronically Signed   By: Marijo Conception M.D.   On: 05/20/2019 16:46    Pending Labs [employer] (From admission, onward)    Start     Ordered  05/20/19 2142  SARS Coronavirus 2 (CEPHEID- Performed in [facility] hospital lab), Hosp Order  (Symptomatic Patients Labs with Precautions )  Once,   R    Question:  Patient immune status  Answer:  Normal   05/20/19 2141   05/20/19 1507  Blood Cultures (routine x 2)  BLOOD CULTURE X 2,   STAT     05/20/19  1508   05/20/19 1507  Urine culture  ONCE - STAT,   STAT     05/20/19 1508          Vitals/Pain Today's Vitals   05/20/19 2115 05/20/19 2130 05/20/19 2134 05/20/19 2145  BP: (!) 166/80 (!) 117/98  (!) 174/91  Pulse: 80 (!) 102  93  Resp: 19 (!) 21  20  Temp:      TempSrc:      SpO2: 100% 100%  100%  Weight:   68.9 kg   Height:   5\' 8"  (1.727 m)     Isolation Precautions Droplet and Contact precautions  Medications Medications  morphine 4 MG/ML injection 4 mg (has no administration in time range)  lactated ringers bolus 1,000 mL (1,000 mLs Intravenous New Bag/Given 05/20/19 2031)  iohexol (OMNIPAQUE) 300 MG/ML solution 100 mL (100 mLs Intravenous Contrast Given 05/20/19 1956)    Mobility walks with device High fall risk   Focused Assessments    R Recommendations: See Admitting Provider Note  Report given to:   Additional Notes:

## 2019-05-20 NOTE — ED Notes (Signed)
Per daughter, patient states she is in severe pain. Patient daughter requesting pain medication. This RN notified [facility].

## 2019-05-20 NOTE — ED Notes (Signed)
Pt daughter out at nurses station requesting update, update on pt status given

## 2019-05-20 NOTE — ED Notes (Signed)
Attempted to call the patient's daughter and update her that her mother is going to [id number]. Received the voicemail.

## 2019-05-20 NOTE — ED Notes (Signed)
Pt daughter standing in doorway of pt room requesting an update, update on pt status provided

## 2019-05-20 NOTE — H&P (Signed)
History and Physical    Melissa Fischer FWY:[id number] DOB: 1934/10/13 DOA: 05/20/2019  PCP: Lajean Manes, MD   Patient coming from: Nursing home   Chief Complaint: slurred speech, not walking, weakness  HPI: Melissa Fischer is a 85 y.o. female with medical history significant for Alzheimer dementia with behavioral disturbance, type 2 diabetes mellitus, COPD, and history of mild renal insufficiency, now presenting to the emergency department from her nursing home for evaluation of slurred speech and weakness.  Patient is unable to provide much history due to her clinical condition, but her daughter accompanied her to the emergency department and reports that the patient is disoriented but talkative at baseline, and also ambulates at baseline, but has not been talking as much, seems to have slurred speech, and appeared to have some right-sided weakness over the past 2 days.  Patient had fallen on 05/08/2019, was evaluated in the emergency department at that time and stable for discharge back to her nursing facility.  She had complained of some neck pain since the fall, complained of abdominal pain in the ED, but denies any pain now at time of admission.  ED Course: Upon arrival to the ED, patient is found to be afebrile, saturating 100% on room air, and with stable blood pressure.  EKG features a sinus or ectopic atrial rhythm with first-degree AV nodal block and LAFB.  Chest x-ray is negative for acute cardiopulmonary disease.  Noncontrast head CT is negative for acute intracranial abnormality.  CT cervical spine is negative for acute findings.  No acute findings on CT abdomen or pelvis.  Chemistry panel features a mild elevation in BUN to creatinine ratio and CBC is notable for mild leukocytosis.  Lactic acid is reassuringly normal.  UDS is positive for prescribed benzodiazepines only.  Urinalysis features and elevated specific gravity and ketonuria.  Patient was given a liter of IV fluids, morphine, and her  blood and urine were cultured in the ED.  She will be screened for the novel coronavirus and observed in the hospital for ongoing evaluation and management.  Review of Systems:  Unable to complete ROS secondary to the patient's clinical condition.  Past Medical History:  Diagnosis Date  . Alzheimer disease ([place])   . Bronchitis   . COPD (chronic obstructive pulmonary disease) ([place])   . Depression   . Diabetes ([place])   . Hypercholesteremia   . Memory loss   . Pacemaker    [employer] DOI 2011  . Peripheral neuropathy   . Renal disease   . Shoulder pain, left   . Syncope     Past Surgical History:  Procedure Laterality Date  . HIP PINNING,CANNULATED Right 04/26/2016   Procedure: CANNULATED HIP PINNING;  Surgeon: Tania Ade, MD;  Location: [facility];  Service: Orthopedics;  Laterality: Right;  . no surgical history    . PACEMAKER PLACEMENT       reports that she quit smoking about 44 years ago. She has never used smokeless tobacco. She reports current alcohol use. She reports that she does not use drugs.  Allergies  Allergen Reactions  . Aricept [Donepezil] Other (See Comments)    Leg cramps  . Exelon [Rivastigmine] Other (See Comments)    dizziness and sad  . Namenda [Memantine] Other (See Comments)    dizziness  . Penicillins     "pt does not know its been so long" Has patient had a PCN reaction causing immediate rash, facial/tongue/throat swelling, SOB or lightheadedness with hypotension: Unknown Has patient had a  PCN reaction causing severe rash involving mucus membranes or skin necrosis: Unknown Has patient had a PCN reaction that required hospitalization: Unknown Has patient had a PCN reaction occurring within the last 10 years: Unknown If all of the above answers are "NO", then may proceed with Cephalosporin use.   . Statins Other (See Comments)    unknown  . Sulfa Antibiotics Other (See Comments)    Unknown  . Wellbutrin [Bupropion] Other (See Comments)    Low  sodium    Family History  Problem Relation Age of Onset  . Diabetes Mother   . Dementia Neg Hx      Prior to Admission medications   Medication Sig Start Date End Date Taking? Authorizing Provider  acetaminophen (TYLENOL) 325 MG tablet Take 2 tablets (650 mg total) by mouth every 8 (eight) hours as needed. Patient not taking: Reported on 05/08/2019 07/16/18   Nuala Alpha A, PA-C  alendronate (FOSAMAX) 70 MG tablet Take 70 mg by mouth every Monday. Take with a full glass of water on an empty stomach.    [provider]  Calcium Carb-Cholecalciferol (CALCIUM 600+D) 600-800 MG-UNIT TABS Take 1 tablet by mouth daily.    [provider]  Ergocalciferol (VITAMIN D2) 2000 units TABS Take 1 tablet by mouth daily.    [provider]  fexofenadine (ALLEGRA) 180 MG tablet Take 180 mg by mouth daily.     [provider]  Fluticasone-Salmeterol (ADVAIR) 250-50 MCG/DOSE AEPB Inhale 1 puff into the lungs 2 (two) times daily.    [provider]  guaiFENesin (ROBITUSSIN) 100 MG/5ML liquid Take 5 mLs by mouth every 8 (eight) hours as needed for cough.    [provider]  LORazepam (ATIVAN) 0.5 MG tablet Take 0.5 mg by mouth See admin instructions. Take one tablet 30 mins before daily care    [provider]  metFORMIN (GLUCOPHAGE-XR) 500 MG 24 hr tablet Take 500-1,000 mg by mouth 2 (two) times daily. Take two tablets in the morning and one tablet in the evening. 01/23/16   [provider]  Skin Protectants, Misc. (DIMETHICONE-ZINC OXIDE) cream Apply 1 application topically 3 (three) times daily as needed for dry skin.    [provider]    Physical Exam: Vitals:   05/20/19 2115 05/20/19 2130 05/20/19 2134 05/20/19 2145  BP: (!) 166/80 (!) 117/98  (!) 174/91  Pulse: 80 (!) 102  93  Resp: 19 (!) 21  20  Temp:      TempSrc:      SpO2: 100% 100%  100%  Weight:   68.9 kg   Height:   5\' 8"  (1.727 m)     Constitutional:  NAD, calm  Eyes: PERTLA, lids and conjunctivae normal ENMT: Mucous membranes are moist. Posterior pharynx clear of any exudate or lesions.   Neck: normal, supple, no masses, no thyromegaly Respiratory:  no wheezing, no crackles. Normal respiratory effort. No accessory muscle use.  Cardiovascular: S1 & S2 heard, regular rate and rhythm. No extremity edema.   Abdomen: No distension, no tenderness, soft. Bowel sounds active.  Musculoskeletal: no clubbing / cyanosis. No joint deformity upper and lower extremities.    Skin: no significant rashes, lesions, ulcers. Warm, dry, well-perfused. Neurologic: No facial asymmetry. PERRL. No dysarthria. Expressive aphasia. Sensation to light touch intact. Not able to follow commands. Moving all extremities spontaneously.  Psychiatric:  Alert. Does not answer orientation questions.    Labs on Admission: I have personally reviewed following labs and imaging studies  CBC: Recent Labs  Lab 05/20/19 1615  WBC 11.4*  NEUTROABS 7.9*  HGB 12.8  HCT 38.8  MCV 90.7  PLT 098   Basic Metabolic Panel: Recent Labs  Lab 05/20/19 1615  NA 144  K 3.6  CL 107  CO2 25  GLUCOSE 231*  BUN 28*  CREATININE 0.90  CALCIUM 10.1   GFR: Estimated Creatinine Clearance: 47.8 mL/min (by C-G formula based on SCr of 0.9 mg/dL). Liver Function Tests: Recent Labs  Lab 05/20/19 1615  AST 14*  ALT 14  ALKPHOS 83  BILITOT 0.6  PROT 6.7  ALBUMIN 3.1*   No results for input(s): LIPASE, AMYLASE in the last 168 hours. No results for input(s): AMMONIA in the last 168 hours. Coagulation Profile: Recent Labs  Lab 05/20/19 1615  INR 1.1   Cardiac Enzymes: No results for input(s): CKTOTAL, CKMB, CKMBINDEX, TROPONINI in the last 168 hours. BNP (last 3 results) No results for input(s): PROBNP in the last 8760 hours. HbA1C: No results for input(s): HGBA1C in the last 72 hours. CBG: Recent Labs  Lab 05/20/19 1529  GLUCAP 229*   Lipid Profile: No results for  input(s): CHOL, HDL, LDLCALC, TRIG, CHOLHDL, LDLDIRECT in the last 72 hours. Thyroid Function Tests: Recent Labs    05/20/19 1600  TSH 2.207   Anemia Panel: No results for input(s): VITAMINB12, FOLATE, FERRITIN, TIBC, IRON, RETICCTPCT in the last 72 hours. Urine analysis:    Component Value Date/Time   COLORURINE YELLOW 05/20/2019 2132   APPEARANCEUR HAZY (A) 05/20/2019 2132   LABSPEC 1.038 (H) 05/20/2019 2132   PHURINE 5.0 05/20/2019 2132   GLUCOSEU 150 (A) 05/20/2019 2132   HGBUR NEGATIVE 05/20/2019 2132   BILIRUBINUR NEGATIVE 05/20/2019 2132   KETONESUR 20 (A) 05/20/2019 2132   PROTEINUR NEGATIVE 05/20/2019 2132   NITRITE NEGATIVE 05/20/2019 2132   LEUKOCYTESUR NEGATIVE 05/20/2019 2132   Sepsis Labs: @LABRCNTIP (procalcitonin:4,lacticidven:4) )No results found for this or any previous visit (from the past 240 hour(s)).   Radiological Exams on Admission: Ct Head Wo Contrast  Result Date: 05/20/2019 CLINICAL DATA:  Fall neck pain and altered mental status EXAM: CT HEAD WITHOUT CONTRAST CT CERVICAL SPINE WITHOUT CONTRAST TECHNIQUE: Multidetector CT imaging of the head and cervical spine was performed following the standard protocol without intravenous contrast. Multiplanar CT image reconstructions of the cervical spine were also generated. COMPARISON:  CT head and cervical spine 05/08/2019 FINDINGS: CT HEAD FINDINGS Brain: There is no mass, hemorrhage or extra-axial collection. There is generalized atrophy without lobar predilection. There is hypoattenuation of the periventricular white matter, most commonly indicating chronic ischemic microangiopathy. Vascular: No abnormal hyperdensity of the major intracranial arteries or dural venous sinuses. No intracranial atherosclerosis. Skull: The visualized skull base, calvarium and extracranial soft tissues are normal. Sinuses/Orbits: No fluid levels or advanced mucosal thickening of the visualized paranasal sinuses. No mastoid or middle ear  effusion. The orbits are normal. CT CERVICAL SPINE FINDINGS Alignment: Grade 1 anterolisthesis at C4-5. Skull base and vertebrae: No acute fracture. Soft tissues and spinal canal: No prevertebral fluid or swelling. No visible canal hematoma. Disc levels: Multilevel moderate-to-severe facet hypertrophy. Upper chest: No pneumothorax, pulmonary nodule or pleural effusion. Other: Normal visualized paraspinal cervical soft tissues. IMPRESSION: 1. No acute abnormality of the head or cervical spine. 2. Chronic ischemic microangiopathy and generalized volume loss. 3. Multilevel cervical facet arthrosis with grade 1 anterolisthesis at C4-5, unchanged. Electronically Signed   By: Ulyses Jarred M.D.   On: 05/20/2019 20:30   Ct Cervical Spine  Wo Contrast  Result Date: 05/20/2019 CLINICAL DATA:  Fall neck pain and altered mental status EXAM: CT HEAD WITHOUT CONTRAST CT CERVICAL SPINE WITHOUT CONTRAST TECHNIQUE: Multidetector CT imaging of the head and cervical spine was performed following the standard protocol without intravenous contrast. Multiplanar CT image reconstructions of the cervical spine were also generated. COMPARISON:  CT head and cervical spine 05/08/2019 FINDINGS: CT HEAD FINDINGS Brain: There is no mass, hemorrhage or extra-axial collection. There is generalized atrophy without lobar predilection. There is hypoattenuation of the periventricular white matter, most commonly indicating chronic ischemic microangiopathy. Vascular: No abnormal hyperdensity of the major intracranial arteries or dural venous sinuses. No intracranial atherosclerosis. Skull: The visualized skull base, calvarium and extracranial soft tissues are normal. Sinuses/Orbits: No fluid levels or advanced mucosal thickening of the visualized paranasal sinuses. No mastoid or middle ear effusion. The orbits are normal. CT CERVICAL SPINE FINDINGS Alignment: Grade 1 anterolisthesis at C4-5. Skull base and vertebrae: No acute fracture. Soft tissues  and spinal canal: No prevertebral fluid or swelling. No visible canal hematoma. Disc levels: Multilevel moderate-to-severe facet hypertrophy. Upper chest: No pneumothorax, pulmonary nodule or pleural effusion. Other: Normal visualized paraspinal cervical soft tissues. IMPRESSION: 1. No acute abnormality of the head or cervical spine. 2. Chronic ischemic microangiopathy and generalized volume loss. 3. Multilevel cervical facet arthrosis with grade 1 anterolisthesis at C4-5, unchanged. Electronically Signed   By: Ulyses Jarred M.D.   On: 05/20/2019 20:30   Ct Abdomen Pelvis W Contrast  Result Date: 05/20/2019 CLINICAL DATA:  Fall several days ago with persistent weakness and abdominal pain EXAM: CT ABDOMEN AND PELVIS WITH CONTRAST TECHNIQUE: Multidetector CT imaging of the abdomen and pelvis was performed using the standard protocol following bolus administration of intravenous contrast. CONTRAST:  165mL OMNIPAQUE IOHEXOL 300 MG/ML  SOLN COMPARISON:  None. FINDINGS: Lower chest: No acute abnormality. Hepatobiliary: No focal liver abnormality is seen. No gallstones, gallbladder wall thickening, or biliary dilatation. Pancreas: Unremarkable. No pancreatic ductal dilatation or surrounding inflammatory changes. Spleen: Normal in size without focal abnormality. Adrenals/Urinary Tract: Adrenal glands are within normal limits. The kidneys are well visualized bilaterally. No renal calculi or obstructive changes are seen. Normal excretion is noted bilaterally. Stomach/Bowel: Mild diverticular changes noted. There are changes suggestive of prior appendectomy. Clinical correlation is recommended. No inflammatory changes are seen. The stomach and small bowel appear within normal limits. Vascular/Lymphatic: Aortic atherosclerosis. No enlarged abdominal or pelvic lymph nodes. Reproductive: Status post hysterectomy. No adnexal masses. Other: No abdominal wall hernia or abnormality. No abdominopelvic ascites. Musculoskeletal:  Postsurgical changes are noted in the right hip. No acute bony abnormality is noted. IMPRESSION: Diverticulosis without diverticulitis. No acute abnormality noted. Electronically Signed   By: Inez Catalina M.D.   On: 05/20/2019 20:29   Dg Chest Port 1 View  Result Date: 05/20/2019 CLINICAL DATA:  Altered mental status. EXAM: PORTABLE CHEST 1 VIEW COMPARISON:  Radiograph of May 08, 2019. FINDINGS: The heart size and mediastinal contours are within normal limits. Both lungs are clear. No pneumothorax or pleural effusion is noted. Left-sided pacemaker is unchanged in position. The visualized skeletal structures are unremarkable. IMPRESSION: No active disease. Electronically Signed   By: Marijo Conception M.D.   On: 05/20/2019 16:46    EKG: Independently reviewed. Sinus or ectopic atrial rhythm, 1st AV block, LAFB.   Assessment/Plan   1. Acute encephalopathy  - Patient is said to be ambulatory, disoriented but talkative, and requires assistance with ADL's at her baseline, now presenting with 2  days of decreased speech, dysarthria, right-sided weakness, and not walking per report of her daughter  - She had head CT in ED that was negative for acute findings, normal TSH, UA not consistent with infection, no meningismus  - Check MRI brain, continue neuro checks, check ammonia, B12, folate, RPR, complete CVA w/u if MRI positive    2. Alzheimer's dementia  - She was unable to tolerate Exelon, Namenda, or Aricept  - She is treated with as-needed Ativan at the nursing home  - Calm and cooperative on admission    3. Type II DM  - No recent A1c on file - Managed with metformin at the nursing home, held on admission  - Use a low-intensity SSI with Novolog while in hospital    4. COPD  - No cough or wheeze on admission  - Continue ICS/LABA and as-needed albuterol    PPE: Mask, face shield  DVT prophylaxis: Lovenox  Code Status: Full  Family Communication: Discussed with patient  Consults called: None  Admission status: Observation    Vianne Bulls, MD [facility] Pager [phone]  If 7PM-7AM, please contact night-coverage [email or web address] Password [email or web address]  05/20/2019, 11:01 PM

## 2019-05-20 NOTE — ED Triage Notes (Signed)
[facility] reports that patient had a fall on May 10th but no injures were reported. She is complaining of neck pain since 5/13. She has had altered mental status, right sided weakness and slurred speech for 2 days.

## 2019-05-20 NOTE — ED Notes (Signed)
Patient refused pain medication and states she is not in any pain.

## 2019-05-20 NOTE — ED Provider Notes (Signed)
[facility] EMERGENCY DEPARTMENT Provider Note   CSN: [id number] Arrival date & time: 05/20/19  1435    History   Chief Complaint Chief Complaint  Patient presents with   Altered Mental Status   Weakness    HPI Melissa Fischer is a 85 y.o. female.  HPI 85 year old female with a history of Alzheimer's dementia, COPD, diabetes, hypercholesterol, T2 DM, CKD presents with altered mental status from her facility.  Patient was evaluated in the ED on 5/10 after a fall.  Work-up was negative for acute injury and she was discharged.  Patient reportedly has continued to complain of neck pain since that time.  Patient is unable to provide history due to her altered mental status versus dementia.  Patient's daughter states that at baseline the patient will walk around on her own.  She talks but is not oriented to the date or time.  Daughter states that this morning she felt like the patient speech was slurred.  Patient was tested for COVID on Monday and was negative.   Past Medical History:  Diagnosis Date   Alzheimer disease ([place])    Bronchitis    COPD (chronic obstructive pulmonary disease) ([place])    Depression    Diabetes ([place])    Hypercholesteremia    Memory loss    Pacemaker    Medtronic DOI 2011   Peripheral neuropathy    Renal disease    Shoulder pain, left    Syncope     Patient Active Problem List   Diagnosis Date Noted   Alzheimer's dementia without behavioral disturbance ([place]) 12/03/2017   Gait abnormality 04/02/2017   SIADH (syndrome of inappropriate ADH production) ([place]) 02/06/2017   Encephalopathy acute 02/01/2017   Hyponatremia 01/31/2017   Acute encephalopathy 01/31/2017   Generalized weakness    Influenza A 01/25/2017   Hip fracture ([place]) 04/26/2016   Type 2 diabetes mellitus with hyperglycemia ([place]) 04/26/2016   COPD (chronic obstructive pulmonary disease) ([place]) 04/26/2016   Closed right hip fracture ([place]) 04/26/2016     Subcapital fracture of right femur ([place])    Alzheimer's dementia with behavioral disturbance ([place]) 03/31/2016   DM (diabetes mellitus) type II controlled, neurological manifestation ([place]) 10/30/2015   Depression 10/30/2015   CKD (chronic kidney disease) 10/30/2015    Past Surgical History:  Procedure Laterality Date   HIP PINNING,CANNULATED Right 04/26/2016   Procedure: CANNULATED HIP PINNING;  Surgeon: Tania Ade, MD;  Location: [facility];  Service: Orthopedics;  Laterality: Right;   no surgical history     PACEMAKER PLACEMENT       OB History   No obstetric history on file.      Home Medications    Prior to Admission medications   Medication Sig Start Date End Date Taking? Authorizing Provider  acetaminophen (TYLENOL) 325 MG tablet Take 2 tablets (650 mg total) by mouth every 8 (eight) hours as needed. Patient not taking: Reported on 05/08/2019 07/16/18   Nuala Alpha A, PA-C  alendronate (FOSAMAX) 70 MG tablet Take 70 mg by mouth every Monday. Take with a full glass of water on an empty stomach.    [provider]  Calcium Carb-Cholecalciferol (CALCIUM 600+D) 600-800 MG-UNIT TABS Take 1 tablet by mouth daily.    [provider]  Ergocalciferol (VITAMIN D2) 2000 units TABS Take 1 tablet by mouth daily.    [provider]  fexofenadine (ALLEGRA) 180 MG tablet Take 180 mg by mouth daily.     [provider]  Fluticasone-Salmeterol (  ADVAIR) 250-50 MCG/DOSE AEPB Inhale 1 puff into the lungs 2 (two) times daily.    [provider]  guaiFENesin (ROBITUSSIN) 100 MG/5ML liquid Take 5 mLs by mouth every 8 (eight) hours as needed for cough.    [provider]  LORazepam (ATIVAN) 0.5 MG tablet Take 0.5 mg by mouth See admin instructions. Take one tablet 30 mins before daily care    [provider]  metFORMIN (GLUCOPHAGE-XR) 500 MG 24 hr tablet Take 500-1,000 mg by mouth 2 (two) times daily. Take two tablets in the  morning and one tablet in the evening. 01/23/16   [provider]  Skin Protectants, Misc. (DIMETHICONE-ZINC OXIDE) cream Apply 1 application topically 3 (three) times daily as needed for dry skin.    [provider]    Family History Family History  Problem Relation Age of Onset   Diabetes Mother    Dementia Neg Hx     Social History Social History   Tobacco Use   Smoking status: Former Smoker    Last attempt to quit: 12/29/1974    Years since quitting: 44.4   Smokeless tobacco: Never Used  Substance Use Topics   Alcohol use: Yes    Alcohol/week: 0.0 standard drinks    Comment: 1 drinks per day (10/29/15) sometimes none per day   Drug use: No     Allergies   Aricept [donepezil]; Exelon [rivastigmine]; Namenda [memantine]; Penicillins; Statins; Sulfa antibiotics; and Wellbutrin [bupropion]   Review of Systems Review of Systems  Unable to perform ROS: Mental status change     Physical Exam Updated Vital Signs BP (!) 166/80    Pulse 80    Temp 98.4 F (36.9 C) (Rectal)    Resp 19    Ht 5\' 8"  (1.727 m)    Wt 68.9 kg    SpO2 100%    BMI 23.11 kg/m   Physical Exam Vitals signs and nursing note reviewed.  Constitutional:      General: She is not in acute distress.    Appearance: She is well-developed.  HENT:     Head: Normocephalic and atraumatic.     Mouth/Throat:     Mouth: Mucous membranes are dry.  Eyes:     Conjunctiva/sclera: Conjunctivae normal.  Neck:     Musculoskeletal: Neck supple.  Cardiovascular:     Rate and Rhythm: Normal rate and regular rhythm.     Heart sounds: No murmur.  Pulmonary:     Effort: Pulmonary effort is normal. No respiratory distress.     Breath sounds: Normal breath sounds.  Abdominal:     Palpations: Abdomen is soft.     Tenderness: There is generalized abdominal tenderness.  Skin:    General: Skin is warm and dry.  Neurological:     Comments: Exam challenging due to patient's lack of the participation   Moves upper extremities spontaneously Will not move lower extremities on command No cranial nerve deficit      ED Treatments / Results  Labs (all labs ordered are listed, but only abnormal results are displayed) Labs Reviewed  COMPREHENSIVE METABOLIC PANEL - Abnormal; Notable for the following components:      Result Value   Glucose, Bld 231 (*)    BUN 28 (*)    Albumin 3.1 (*)    AST 14 (*)    GFR calc non Af Amer 59 (*)    All other components within normal limits  CBC WITH DIFFERENTIAL/PLATELET - Abnormal; Notable for the following  components:   WBC 11.4 (*)    Neutro Abs 7.9 (*)    All other components within normal limits  URINALYSIS, COMPLETE (UACMP) WITH MICROSCOPIC - Abnormal; Notable for the following components:   APPearance HAZY (*)    Specific Gravity, Urine 1.038 (*)    Glucose, UA 150 (*)    Ketones, ur 20 (*)    Bacteria, UA FEW (*)    All other components within normal limits  RAPID URINE DRUG SCREEN, HOSP PERFORMED - Abnormal; Notable for the following components:   Benzodiazepines POSITIVE (*)    All other components within normal limits  CBG MONITORING, ED - Abnormal; Notable for the following components:   Glucose-Capillary 229 (*)    All other components within normal limits  CULTURE, BLOOD (ROUTINE X 2)  CULTURE, BLOOD (ROUTINE X 2)  URINE CULTURE  SARS CORONAVIRUS 2 (HOSPITAL ORDER, [facility])  LACTIC ACID, PLASMA  PROTIME-INR  TSH    EKG EKG Interpretation  Date/Time:  Friday May 20 2019 15:26:25 EDT Ventricular Rate:  95 PR Interval:    QRS Duration: 86 QT Interval:  368 QTC Calculation: 463 R Axis:   -58 Text Interpretation:  Sinus or ectopic atrial rhythm Prolonged PR interval Left anterior fascicular block No significant change since last tracing Confirmed by Deno Etienne [phone]) on 05/20/2019 4:41:29 PM   Radiology Ct Head Wo Contrast  Result Date: 05/20/2019 CLINICAL DATA:  Fall neck pain and altered  mental status EXAM: CT HEAD WITHOUT CONTRAST CT CERVICAL SPINE WITHOUT CONTRAST TECHNIQUE: Multidetector CT imaging of the head and cervical spine was performed following the standard protocol without intravenous contrast. Multiplanar CT image reconstructions of the cervical spine were also generated. COMPARISON:  CT head and cervical spine 05/08/2019 FINDINGS: CT HEAD FINDINGS Brain: There is no mass, hemorrhage or extra-axial collection. There is generalized atrophy without lobar predilection. There is hypoattenuation of the periventricular white matter, most commonly indicating chronic ischemic microangiopathy. Vascular: No abnormal hyperdensity of the major intracranial arteries or dural venous sinuses. No intracranial atherosclerosis. Skull: The visualized skull base, calvarium and extracranial soft tissues are normal. Sinuses/Orbits: No fluid levels or advanced mucosal thickening of the visualized paranasal sinuses. No mastoid or middle ear effusion. The orbits are normal. CT CERVICAL SPINE FINDINGS Alignment: Grade 1 anterolisthesis at C4-5. Skull base and vertebrae: No acute fracture. Soft tissues and spinal canal: No prevertebral fluid or swelling. No visible canal hematoma. Disc levels: Multilevel moderate-to-severe facet hypertrophy. Upper chest: No pneumothorax, pulmonary nodule or pleural effusion. Other: Normal visualized paraspinal cervical soft tissues. IMPRESSION: 1. No acute abnormality of the head or cervical spine. 2. Chronic ischemic microangiopathy and generalized volume loss. 3. Multilevel cervical facet arthrosis with grade 1 anterolisthesis at C4-5, unchanged. Electronically Signed   By: Ulyses Jarred M.D.   On: 05/20/2019 20:30   Ct Cervical Spine Wo Contrast  Result Date: 05/20/2019 CLINICAL DATA:  Fall neck pain and altered mental status EXAM: CT HEAD WITHOUT CONTRAST CT CERVICAL SPINE WITHOUT CONTRAST TECHNIQUE: Multidetector CT imaging of the head and cervical spine was performed  following the standard protocol without intravenous contrast. Multiplanar CT image reconstructions of the cervical spine were also generated. COMPARISON:  CT head and cervical spine 05/08/2019 FINDINGS: CT HEAD FINDINGS Brain: There is no mass, hemorrhage or extra-axial collection. There is generalized atrophy without lobar predilection. There is hypoattenuation of the periventricular white matter, most commonly indicating chronic ischemic microangiopathy. Vascular: No abnormal hyperdensity of the major intracranial  arteries or dural venous sinuses. No intracranial atherosclerosis. Skull: The visualized skull base, calvarium and extracranial soft tissues are normal. Sinuses/Orbits: No fluid levels or advanced mucosal thickening of the visualized paranasal sinuses. No mastoid or middle ear effusion. The orbits are normal. CT CERVICAL SPINE FINDINGS Alignment: Grade 1 anterolisthesis at C4-5. Skull base and vertebrae: No acute fracture. Soft tissues and spinal canal: No prevertebral fluid or swelling. No visible canal hematoma. Disc levels: Multilevel moderate-to-severe facet hypertrophy. Upper chest: No pneumothorax, pulmonary nodule or pleural effusion. Other: Normal visualized paraspinal cervical soft tissues. IMPRESSION: 1. No acute abnormality of the head or cervical spine. 2. Chronic ischemic microangiopathy and generalized volume loss. 3. Multilevel cervical facet arthrosis with grade 1 anterolisthesis at C4-5, unchanged. Electronically Signed   By: Ulyses Jarred M.D.   On: 05/20/2019 20:30   Ct Abdomen Pelvis W Contrast  Result Date: 05/20/2019 CLINICAL DATA:  Fall several days ago with persistent weakness and abdominal pain EXAM: CT ABDOMEN AND PELVIS WITH CONTRAST TECHNIQUE: Multidetector CT imaging of the abdomen and pelvis was performed using the standard protocol following bolus administration of intravenous contrast. CONTRAST:  123mL OMNIPAQUE IOHEXOL 300 MG/ML  SOLN COMPARISON:  None. FINDINGS:  Lower chest: No acute abnormality. Hepatobiliary: No focal liver abnormality is seen. No gallstones, gallbladder wall thickening, or biliary dilatation. Pancreas: Unremarkable. No pancreatic ductal dilatation or surrounding inflammatory changes. Spleen: Normal in size without focal abnormality. Adrenals/Urinary Tract: Adrenal glands are within normal limits. The kidneys are well visualized bilaterally. No renal calculi or obstructive changes are seen. Normal excretion is noted bilaterally. Stomach/Bowel: Mild diverticular changes noted. There are changes suggestive of prior appendectomy. Clinical correlation is recommended. No inflammatory changes are seen. The stomach and small bowel appear within normal limits. Vascular/Lymphatic: Aortic atherosclerosis. No enlarged abdominal or pelvic lymph nodes. Reproductive: Status post hysterectomy. No adnexal masses. Other: No abdominal wall hernia or abnormality. No abdominopelvic ascites. Musculoskeletal: Postsurgical changes are noted in the right hip. No acute bony abnormality is noted. IMPRESSION: Diverticulosis without diverticulitis. No acute abnormality noted. Electronically Signed   By: Inez Catalina M.D.   On: 05/20/2019 20:29   Dg Chest Port 1 View  Result Date: 05/20/2019 CLINICAL DATA:  Altered mental status. EXAM: PORTABLE CHEST 1 VIEW COMPARISON:  Radiograph of May 08, 2019. FINDINGS: The heart size and mediastinal contours are within normal limits. Both lungs are clear. No pneumothorax or pleural effusion is noted. Left-sided pacemaker is unchanged in position. The visualized skeletal structures are unremarkable. IMPRESSION: No active disease. Electronically Signed   By: Marijo Conception M.D.   On: 05/20/2019 16:46    Procedures Procedures (including critical care time)  Medications Ordered in ED Medications  morphine 4 MG/ML injection 4 mg (has no administration in time range)  lactated ringers bolus 1,000 mL (1,000 mLs Intravenous New Bag/Given  05/20/19 2031)  iohexol (OMNIPAQUE) 300 MG/ML solution 100 mL (100 mLs Intravenous Contrast Given 05/20/19 1956)     Initial Impression / Assessment and Plan / ED Course  I have reviewed the triage vital signs and the nursing notes.  Pertinent labs & imaging results that were available during my care of the patient were reviewed by me and considered in my medical decision making (see chart for details).  85 year old female with a history of Alzheimer's dementia, COPD, diabetes, hypercholesterol, T2 DM, CKD presents with altered mental status from her facility.  Stable.  Afebrile.  Minimal participation with history and exam.  Patient's daughter is now at bedside  and states that this is an acute change in the patient's mental status.  I had the daughter help him prompting the patient to perform certain actions. Patient will cross midline with her eyes.  Differential includes infection, CVA, or worsening of her chronic dementia.  No focal findings on her neurological exam although limited to the patient's lack of participation.  Stroke remains on the differential but there is no clear timeline of when her symptoms started that make her a TPA or interventional candidate.  WBC 11.4 but CBC otherwise unremarkable.  Glucose 231, BUN 28, creatinine 1.9.  Lactic acid 1.4, TSH 2.2 INR 1.1.  Cultures obtained.   Patient appears dry and given a bolus of fluids.  UA with 20 ketones, few bacteria, negative nitrite, negative leukocytes.  X-ray shows no active disease.  CT head and C-spine showed no acute abnormalities.  T of the abdomen pelvis shows diverticulosis without diverticulitis.  No acute abnormalities.  Patient admitted for acute delirium.   Final Clinical Impressions(s) / ED Diagnoses   Final diagnoses:  Delirium  Dehydration    ED Discharge Orders    None       Trinidad Curet, MD 05/20/19 [place], [place], DO 05/20/19 2224

## 2019-05-20 NOTE — ED Notes (Signed)
Pt daughter continues to come out to nurses station, pt daughter given another update

## 2019-05-21 ENCOUNTER — Observation Stay (HOSPITAL_COMMUNITY): Payer: Medicare Other

## 2019-05-21 DIAGNOSIS — R0600 Dyspnea, unspecified: Secondary | ICD-10-CM | POA: Diagnosis not present

## 2019-05-21 DIAGNOSIS — Z950 Presence of cardiac pacemaker: Secondary | ICD-10-CM | POA: Diagnosis not present

## 2019-05-21 DIAGNOSIS — Z7401 Bed confinement status: Secondary | ICD-10-CM | POA: Diagnosis not present

## 2019-05-21 DIAGNOSIS — R21 Rash and other nonspecific skin eruption: Secondary | ICD-10-CM | POA: Diagnosis not present

## 2019-05-21 DIAGNOSIS — G301 Alzheimer's disease with late onset: Secondary | ICD-10-CM | POA: Diagnosis not present

## 2019-05-21 DIAGNOSIS — K1379 Other lesions of oral mucosa: Secondary | ICD-10-CM | POA: Diagnosis not present

## 2019-05-21 DIAGNOSIS — J440 Chronic obstructive pulmonary disease with acute lower respiratory infection: Secondary | ICD-10-CM | POA: Diagnosis present

## 2019-05-21 DIAGNOSIS — G309 Alzheimer's disease, unspecified: Secondary | ICD-10-CM | POA: Diagnosis present

## 2019-05-21 DIAGNOSIS — F329 Major depressive disorder, single episode, unspecified: Secondary | ICD-10-CM | POA: Diagnosis present

## 2019-05-21 DIAGNOSIS — E7800 Pure hypercholesterolemia, unspecified: Secondary | ICD-10-CM | POA: Diagnosis present

## 2019-05-21 DIAGNOSIS — Z87891 Personal history of nicotine dependence: Secondary | ICD-10-CM | POA: Diagnosis not present

## 2019-05-21 DIAGNOSIS — G92 Toxic encephalopathy: Secondary | ICD-10-CM | POA: Diagnosis present

## 2019-05-21 DIAGNOSIS — R410 Disorientation, unspecified: Secondary | ICD-10-CM | POA: Diagnosis not present

## 2019-05-21 DIAGNOSIS — E860 Dehydration: Secondary | ICD-10-CM | POA: Diagnosis present

## 2019-05-21 DIAGNOSIS — F0281 Dementia in other diseases classified elsewhere with behavioral disturbance: Secondary | ICD-10-CM | POA: Diagnosis present

## 2019-05-21 DIAGNOSIS — E876 Hypokalemia: Secondary | ICD-10-CM | POA: Diagnosis present

## 2019-05-21 DIAGNOSIS — E1165 Type 2 diabetes mellitus with hyperglycemia: Secondary | ICD-10-CM | POA: Diagnosis present

## 2019-05-21 DIAGNOSIS — R4781 Slurred speech: Secondary | ICD-10-CM | POA: Diagnosis present

## 2019-05-21 DIAGNOSIS — J1289 Other viral pneumonia: Secondary | ICD-10-CM | POA: Diagnosis present

## 2019-05-21 DIAGNOSIS — R402411 Glasgow coma scale score 13-15, in the field [EMT or ambulance]: Secondary | ICD-10-CM | POA: Diagnosis not present

## 2019-05-21 DIAGNOSIS — Z66 Do not resuscitate: Secondary | ICD-10-CM | POA: Diagnosis present

## 2019-05-21 DIAGNOSIS — M2550 Pain in unspecified joint: Secondary | ICD-10-CM | POA: Diagnosis not present

## 2019-05-21 DIAGNOSIS — K047 Periapical abscess without sinus: Secondary | ICD-10-CM | POA: Diagnosis present

## 2019-05-21 DIAGNOSIS — R627 Adult failure to thrive: Secondary | ICD-10-CM | POA: Diagnosis present

## 2019-05-21 DIAGNOSIS — J438 Other emphysema: Secondary | ICD-10-CM | POA: Diagnosis not present

## 2019-05-21 DIAGNOSIS — G9340 Encephalopathy, unspecified: Secondary | ICD-10-CM | POA: Diagnosis not present

## 2019-05-21 DIAGNOSIS — N390 Urinary tract infection, site not specified: Secondary | ICD-10-CM | POA: Diagnosis present

## 2019-05-21 DIAGNOSIS — U071 COVID-19: Secondary | ICD-10-CM | POA: Diagnosis present

## 2019-05-21 DIAGNOSIS — R471 Dysarthria and anarthria: Secondary | ICD-10-CM | POA: Diagnosis present

## 2019-05-21 DIAGNOSIS — R4182 Altered mental status, unspecified: Secondary | ICD-10-CM | POA: Diagnosis not present

## 2019-05-21 DIAGNOSIS — E1142 Type 2 diabetes mellitus with diabetic polyneuropathy: Secondary | ICD-10-CM | POA: Diagnosis present

## 2019-05-21 DIAGNOSIS — Z1159 Encounter for screening for other viral diseases: Secondary | ICD-10-CM | POA: Diagnosis not present

## 2019-05-21 LAB — LACTATE DEHYDROGENASE: LDH: 161 U/L (ref 98–192)

## 2019-05-21 LAB — CBC WITH DIFFERENTIAL/PLATELET
Abs Immature Granulocytes: 0.08 10*3/uL — ABNORMAL HIGH (ref 0.00–0.07)
Basophils Absolute: 0.1 10*3/uL (ref 0.0–0.1)
Basophils Relative: 1 %
Eosinophils Absolute: 0.1 10*3/uL (ref 0.0–0.5)
Eosinophils Relative: 1 %
HCT: 36.8 % (ref 36.0–46.0)
Hemoglobin: 12.7 g/dL (ref 12.0–15.0)
Immature Granulocytes: 1 %
Lymphocytes Relative: 24 %
Lymphs Abs: 2.6 10*3/uL (ref 0.7–4.0)
MCH: 29.9 pg (ref 26.0–34.0)
MCHC: 34.5 g/dL (ref 30.0–36.0)
MCV: 86.6 fL (ref 80.0–100.0)
Monocytes Absolute: 1.2 10*3/uL — ABNORMAL HIGH (ref 0.1–1.0)
Monocytes Relative: 11 %
Neutro Abs: 6.9 10*3/uL (ref 1.7–7.7)
Neutrophils Relative %: 62 %
Platelets: 358 10*3/uL (ref 150–400)
RBC: 4.25 MIL/uL (ref 3.87–5.11)
RDW: 11.9 % (ref 11.5–15.5)
WBC: 11 10*3/uL — ABNORMAL HIGH (ref 4.0–10.5)
nRBC: 0 % (ref 0.0–0.2)

## 2019-05-21 LAB — BASIC METABOLIC PANEL
Anion gap: 17 — ABNORMAL HIGH (ref 5–15)
BUN: 22 mg/dL (ref 8–23)
CO2: 20 mmol/L — ABNORMAL LOW (ref 22–32)
Calcium: 9.5 mg/dL (ref 8.9–10.3)
Chloride: 105 mmol/L (ref 98–111)
Creatinine, Ser: 0.87 mg/dL (ref 0.44–1.00)
GFR calc Af Amer: >60 mL/min (ref >60)
GFR calc non Af Amer: >60 mL/min (ref >60)
Glucose, Bld: 212 mg/dL — ABNORMAL HIGH (ref 70–99)
Potassium: 3.5 mmol/L (ref 3.5–5.1)
Sodium: 142 mmol/L (ref 135–145)

## 2019-05-21 LAB — GLUCOSE, CAPILLARY
Glucose-Capillary: 212 mg/dL — ABNORMAL HIGH (ref 70–99)
Glucose-Capillary: 202 mg/dL — ABNORMAL HIGH (ref 70–99)
Glucose-Capillary: 161 mg/dL — ABNORMAL HIGH (ref 70–99)
Glucose-Capillary: 127 mg/dL — ABNORMAL HIGH (ref 70–99)
Glucose-Capillary: 129 mg/dL — ABNORMAL HIGH (ref 70–99)

## 2019-05-21 LAB — SARS CORONAVIRUS 2 BY RT PCR (HOSPITAL ORDER, PERFORMED IN ~~LOC~~ HOSPITAL LAB): SARS Coronavirus 2: POSITIVE — AB

## 2019-05-21 LAB — RPR: RPR Ser Ql: NONREACTIVE

## 2019-05-21 LAB — VITAMIN B12: Vitamin B-12: 242 pg/mL (ref 180–914)

## 2019-05-21 LAB — C-REACTIVE PROTEIN: CRP: 9 mg/dL — ABNORMAL HIGH (ref <1.0)

## 2019-05-21 LAB — MRSA PCR SCREENING: MRSA by PCR: NEGATIVE

## 2019-05-21 LAB — HIV ANTIBODY (ROUTINE TESTING W REFLEX): HIV Screen 4th Generation wRfx: NONREACTIVE

## 2019-05-21 LAB — AMMONIA: Ammonia: 24 umol/L (ref 9–35)

## 2019-05-21 LAB — FERRITIN: Ferritin: 347 ng/mL — ABNORMAL HIGH (ref 11–307)

## 2019-05-21 LAB — D-DIMER, QUANTITATIVE: D-Dimer, Quant: 1.46 ug/mL-FEU — ABNORMAL HIGH (ref 0.00–0.50)

## 2019-05-21 MED ORDER — CYANOCOBALAMIN 1000 MCG/ML IJ SOLN
1000.0000 ug | Freq: Once | INTRAMUSCULAR | Status: AC
  Administered 2019-05-21: 1000 ug via SUBCUTANEOUS
  Filled 2019-05-21: qty 1

## 2019-05-21 MED ORDER — LACTATED RINGERS IV SOLN
INTRAVENOUS | Status: DC
  Administered 2019-05-21 – 2019-05-22 (×2): via INTRAVENOUS

## 2019-05-21 MED ORDER — MORPHINE SULFATE (PF) 2 MG/ML IV SOLN
1.0000 mg | INTRAVENOUS | Status: DC | PRN
  Administered 2019-05-21: 1 mg via INTRAVENOUS
  Administered 2019-05-22: 2 mg via INTRAVENOUS
  Filled 2019-05-21 (×2): qty 1

## 2019-05-21 MED ORDER — ORAL CARE MOUTH RINSE
15.0000 mL | Freq: Two times a day (BID) | OROMUCOSAL | Status: DC
  Administered 2019-05-23 – 2019-05-27 (×6): 15 mL via OROMUCOSAL

## 2019-05-21 MED ORDER — CHLORHEXIDINE GLUCONATE 0.12 % MT SOLN
15.0000 mL | Freq: Two times a day (BID) | OROMUCOSAL | Status: DC
  Administered 2019-05-21 – 2019-05-30 (×14): 15 mL via OROMUCOSAL
  Filled 2019-05-21 (×14): qty 15

## 2019-05-21 MED ORDER — CLINDAMYCIN PHOSPHATE 600 MG/50ML IV SOLN
600.0000 mg | Freq: Three times a day (TID) | INTRAVENOUS | Status: DC
  Administered 2019-05-21 – 2019-05-27 (×17): 600 mg via INTRAVENOUS
  Filled 2019-05-21 (×19): qty 50

## 2019-05-21 MED ORDER — BIOTENE DRY MOUTH MT LIQD: 15.0000 mL | OROMUCOSAL | Status: DC | PRN

## 2019-05-21 MED ORDER — IOHEXOL 300 MG/ML  SOLN
75.0000 mL | Freq: Once | INTRAMUSCULAR | Status: AC | PRN
  Administered 2019-05-21: 75 mL via INTRAVENOUS

## 2019-05-21 NOTE — Progress Notes (Signed)
Arrived from ED at 2350 to 3w07. Alert and oriented to self. Not following command at time, agitated. Tele in placed. Call light within reach.

## 2019-05-21 NOTE — Progress Notes (Signed)
Patient was admitted with acute encephalopathy as described in H&P. She hasn't had a fever or respiratory sxs. CVA is high on the differential for her encephalopathy and MRI brain was ordered but not yet performed.   COVID-19 screening was performed and has returned positive. Patient is already on 3W at [facility]. Discussed with physician at [facility] and given concern for CVA and likelihood that patient will be discharged soon, we agreed that keeping the patient on the COVID unit at [facility] for MRI brain (and neuro consult if CVA confirmed) would be more prudent.

## 2019-05-21 NOTE — Progress Notes (Signed)
Pt's SARS resulted positive. Dr. Myna Hidalgo made aware. Transfer to 2w.

## 2019-05-21 NOTE — Progress Notes (Signed)
Attempted oral care with foam  Swabs, pt very painful in oral cavity, freely bleeds, foul odor noted, refusing to allow any further oral cleaning or care, MD notified, maxialfacial CT orders received.

## 2019-05-21 NOTE — Progress Notes (Signed)
SLP Cancellation Note  Patient Details Name: Melissa Fischer MRN: [id number] DOB: 30-Sep-1934   Cancelled treatment:       Reason Eval/Treat Not Completed: Patient at procedure or test/unavailable   Elvina Sidle, M.S., CCC-SLP 05/21/2019, 2:43 PM

## 2019-05-21 NOTE — Progress Notes (Signed)
CSW acknowledges SNF consult. The patient is from [facility] memory care.   The patient is currently awaiting PT/OT evaluations. CSW will assist with disposition depending on therapy evaluations.   CSW will continue to follow.   Domenic Schwab, MSW, [place]

## 2019-05-21 NOTE — Progress Notes (Signed)
Spoke with Dr Myna Hidalgo to inform him that patient's pacemaker Medtronic Advisa [phone] is not approved for MRI so scan could not be done

## 2019-05-21 NOTE — Progress Notes (Signed)
Updated pts daughter about pts condition via phone.

## 2019-05-21 NOTE — Progress Notes (Addendum)
[facility] Progress Note  Patient: Melissa Fischer TDD:[id number]   PCP: Lajean Manes, MD DOB: 1934/07/22   DOA: 05/20/2019   DOS: 05/21/2019   Date of Service: the patient was seen and examined on 05/21/2019  Brief hospital course: Pt. with PMH of Alzheimer dementia with behavioral disturbance, type 2 diabetes mellitus, COPD, and history of mild renal insufficiency; admitted on 05/20/2019, presented with complaint of confusion and slurred speech, was found to have failure to thrive and severe dehydration. Currently further plan is continue IV hydration and further work-up.  Subjective: Patient is awake, significant evidence of oral rehydration, not following commands consistently.  Not back to baseline per family.  No chest pain abdominal pain.  No nausea or vomiting.  No oral intake last 24 hours.  Assessment and Plan: 1. Acute metabolic encephalopathy  COVID 19 infection associated with encephalopathy - Patient is said to be ambulatory, disoriented but talkative, and requires assistance with ADL's at her baseline, now presenting with 2 days of decreased speech, dysarthria, right-sided weakness, and not walking per report of her daughter  - She had head CT in ED that was negative for acute findings, normal TSH, UA not consistent with infection, no meningismus  - Unable to get MRI brain, repeat CT scan negative. Mildly low B12 level which is replaced. Normal ammonia. Less likely CVA. More likely infection COVID associated encephalopathy. Continue neuro checks,  2. Alzheimer's dementia  - She was unable to tolerate Exelon, Namenda, or Aricept  - She is treated with as-needed Ativan at the nursing home  - Calm and cooperative on admission    3. Type II DM  - No recent A1c on file - Managed with metformin at the nursing home, held on admission  - Use a low-intensity SSI with Novolog while in hospital    4. COPD  - No cough or wheeze on admission  - Continue ICS/LABA and  as-needed albuterol   5.  Dental infection. - Treat with IV clindamycin while the patient is currently n.p.o.  6.  Failure to thrive, dehydration. Body mass index is 21.19 kg/m.  - Poor oral intake. - Will hydrate with IV LR.  Diet: N.p.o. for now speech therapy evaluation pending DVT Prophylaxis: subcutaneous Heparin  Advance goals of care discussion: Full code  Family Communication: no family was present at bedside, at the time of interview.   Disposition:  Discharge to SNF.  Consultants: none Procedures: none  Scheduled Meds: . chlorhexidine  15 mL Mouth Rinse BID  . enoxaparin (LOVENOX) injection  40 mg Subcutaneous Q24H  . insulin aspart  0-5 Units Subcutaneous QHS  . insulin aspart  0-9 Units Subcutaneous TID WC  . mouth rinse  15 mL Mouth Rinse q12n4p  . mometasone-formoterol  2 puff Inhalation BID  . sodium chloride flush  3 mL Intravenous Q12H   Continuous Infusions: . clindamycin (CLEOCIN) IV 600 mg (05/21/19 1440)  . lactated ringers 100 mL/hr at 05/21/19 1257   PRN Meds: acetaminophen **OR** acetaminophen, albuterol, antiseptic oral rinse, HYDROcodone-acetaminophen, LORazepam, morphine injection, ondansetron **OR** ondansetron (ZOFRAN) IV, senna-docusate Antibiotics: Anti-infectives (From admission, onward)   Start     Dose/Rate Route Frequency Ordered Stop   05/21/19 1400  clindamycin (CLEOCIN) IVPB 600 mg     600 mg 100 mL/hr over 30 Minutes Intravenous Every 8 hours 05/21/19 1212         Objective: Physical Exam: Vitals:   05/21/19 0212 05/21/19 0800 05/21/19 1447 05/21/19 1617  BP: (!) 168/88 (!) 143/97  Marland Kitchen)  148/77  Pulse: 98   89  Resp:      Temp: 98.5 F (36.9 C) 98.3 F (36.8 C) 97.9 F (36.6 C) 98.2 F (36.8 C)  TempSrc: Oral Axillary  Axillary  SpO2: 98% 100%  95%  Weight:      Height:       No intake or output data in the 24 hours ending 05/21/19 1715 Filed Weights   05/20/19 2134 05/20/19 2349  Weight: 68.9 kg 63.2 kg    General: Alert, Awake and not oriented time, Place and Person. Appear in moderate distress, affect flat Eyes: PERRL, Conjunctiva normal ENT: Oral Mucosa shows significant collection, dry. Neck: difficult to assess  JVD, no Abnormal Mass Or lumps Cardiovascular: S1 and S2 Present, no Murmur, Peripheral Pulses Present Respiratory: normal respiratory effort, Bilateral Air entry equal and Decreased, no use of accessory muscle, Clear to Auscultation, no Crackles, no wheezes Abdomen: Bowel Sound present, Soft and no tenderness, no hernia Skin: no redness, no Rash, no induration Extremities: no Pedal edema, no calf tenderness Neurologic: Grossly no focal neuro deficit. Bilaterally Equal motor strength  Data Reviewed: CBC: Recent Labs  Lab 05/20/19 1615 05/21/19 0030  WBC 11.4* 11.0*  NEUTROABS 7.9* 6.9  HGB 12.8 12.7  HCT 38.8 36.8  MCV 90.7 86.6  PLT 393 937   Basic Metabolic Panel: Recent Labs  Lab 05/20/19 1615 05/21/19 0030  NA 144 142  K 3.6 3.5  CL 107 105  CO2 25 20*  GLUCOSE 231* 212*  BUN 28* 22  CREATININE 0.90 0.87  CALCIUM 10.1 9.5    Liver Function Tests: Recent Labs  Lab 05/20/19 1615  AST 14*  ALT 14  ALKPHOS 83  BILITOT 0.6  PROT 6.7  ALBUMIN 3.1*   No results for input(s): LIPASE, AMYLASE in the last 168 hours. Recent Labs  Lab 05/21/19 0030  AMMONIA 24   Coagulation Profile: Recent Labs  Lab 05/20/19 1615  INR 1.1   Cardiac Enzymes: No results for input(s): CKTOTAL, CKMB, CKMBINDEX, TROPONINI in the last 168 hours. BNP (last 3 results) No results for input(s): PROBNP in the last 8760 hours. CBG: Recent Labs  Lab 05/20/19 1529 05/21/19 0225 05/21/19 0829 05/21/19 1148 05/21/19 1643  GLUCAP 229* 212* 202* 161* 127*   Studies: Ct Head Wo Contrast  Result Date: 05/21/2019 CLINICAL DATA:  Altered mental status, right-sided weakness, and slurred speech. EXAM: CT HEAD WITHOUT CONTRAST TECHNIQUE: Contiguous axial images were obtained  from the base of the skull through the vertex without intravenous contrast. COMPARISON:  05/20/2019 FINDINGS: Brain: There is no evidence of acute infarct, intracranial hemorrhage, mass, midline shift, or extra-axial fluid collection. Patchy to confluent cerebral white matter hypodensities are unchanged and nonspecific but compatible with moderate to severe chronic small vessel ischemic disease. Moderate to severe cerebral atrophy is again noted. Vascular: Calcified atherosclerosis at the skull base. No hyperdense vessel. Skull: No acute fracture or suspicious osseous lesion. Chronic deformity of the right zygomatic arch. Sinuses/Orbits: Paranasal sinuses and mastoid air cells are clear. Bilateral cataract extraction. Other: None. IMPRESSION: 1. No evidence of acute intracranial abnormality. 2. Moderate to severe chronic small vessel ischemic disease and cerebral atrophy. Electronically Signed   By: Logan Bores M.D.   On: 05/21/2019 14:47   Ct Head Wo Contrast  Result Date: 05/20/2019 CLINICAL DATA:  Fall neck pain and altered mental status EXAM: CT HEAD WITHOUT CONTRAST CT CERVICAL SPINE WITHOUT CONTRAST TECHNIQUE: Multidetector CT imaging of the head and cervical spine was  performed following the standard protocol without intravenous contrast. Multiplanar CT image reconstructions of the cervical spine were also generated. COMPARISON:  CT head and cervical spine 05/08/2019 FINDINGS: CT HEAD FINDINGS Brain: There is no mass, hemorrhage or extra-axial collection. There is generalized atrophy without lobar predilection. There is hypoattenuation of the periventricular white matter, most commonly indicating chronic ischemic microangiopathy. Vascular: No abnormal hyperdensity of the major intracranial arteries or dural venous sinuses. No intracranial atherosclerosis. Skull: The visualized skull base, calvarium and extracranial soft tissues are normal. Sinuses/Orbits: No fluid levels or advanced mucosal thickening of  the visualized paranasal sinuses. No mastoid or middle ear effusion. The orbits are normal. CT CERVICAL SPINE FINDINGS Alignment: Grade 1 anterolisthesis at C4-5. Skull base and vertebrae: No acute fracture. Soft tissues and spinal canal: No prevertebral fluid or swelling. No visible canal hematoma. Disc levels: Multilevel moderate-to-severe facet hypertrophy. Upper chest: No pneumothorax, pulmonary nodule or pleural effusion. Other: Normal visualized paraspinal cervical soft tissues. IMPRESSION: 1. No acute abnormality of the head or cervical spine. 2. Chronic ischemic microangiopathy and generalized volume loss. 3. Multilevel cervical facet arthrosis with grade 1 anterolisthesis at C4-5, unchanged. Electronically Signed   By: Ulyses Jarred M.D.   On: 05/20/2019 20:30   Ct Cervical Spine Wo Contrast  Result Date: 05/20/2019 CLINICAL DATA:  Fall neck pain and altered mental status EXAM: CT HEAD WITHOUT CONTRAST CT CERVICAL SPINE WITHOUT CONTRAST TECHNIQUE: Multidetector CT imaging of the head and cervical spine was performed following the standard protocol without intravenous contrast. Multiplanar CT image reconstructions of the cervical spine were also generated. COMPARISON:  CT head and cervical spine 05/08/2019 FINDINGS: CT HEAD FINDINGS Brain: There is no mass, hemorrhage or extra-axial collection. There is generalized atrophy without lobar predilection. There is hypoattenuation of the periventricular white matter, most commonly indicating chronic ischemic microangiopathy. Vascular: No abnormal hyperdensity of the major intracranial arteries or dural venous sinuses. No intracranial atherosclerosis. Skull: The visualized skull base, calvarium and extracranial soft tissues are normal. Sinuses/Orbits: No fluid levels or advanced mucosal thickening of the visualized paranasal sinuses. No mastoid or middle ear effusion. The orbits are normal. CT CERVICAL SPINE FINDINGS Alignment: Grade 1 anterolisthesis at C4-5.  Skull base and vertebrae: No acute fracture. Soft tissues and spinal canal: No prevertebral fluid or swelling. No visible canal hematoma. Disc levels: Multilevel moderate-to-severe facet hypertrophy. Upper chest: No pneumothorax, pulmonary nodule or pleural effusion. Other: Normal visualized paraspinal cervical soft tissues. IMPRESSION: 1. No acute abnormality of the head or cervical spine. 2. Chronic ischemic microangiopathy and generalized volume loss. 3. Multilevel cervical facet arthrosis with grade 1 anterolisthesis at C4-5, unchanged. Electronically Signed   By: Ulyses Jarred M.D.   On: 05/20/2019 20:30   Ct Abdomen Pelvis W Contrast  Result Date: 05/20/2019 CLINICAL DATA:  Fall several days ago with persistent weakness and abdominal pain EXAM: CT ABDOMEN AND PELVIS WITH CONTRAST TECHNIQUE: Multidetector CT imaging of the abdomen and pelvis was performed using the standard protocol following bolus administration of intravenous contrast. CONTRAST:  122mL OMNIPAQUE IOHEXOL 300 MG/ML  SOLN COMPARISON:  None. FINDINGS: Lower chest: No acute abnormality. Hepatobiliary: No focal liver abnormality is seen. No gallstones, gallbladder wall thickening, or biliary dilatation. Pancreas: Unremarkable. No pancreatic ductal dilatation or surrounding inflammatory changes. Spleen: Normal in size without focal abnormality. Adrenals/Urinary Tract: Adrenal glands are within normal limits. The kidneys are well visualized bilaterally. No renal calculi or obstructive changes are seen. Normal excretion is noted bilaterally. Stomach/Bowel: Mild diverticular changes noted. There are  changes suggestive of prior appendectomy. Clinical correlation is recommended. No inflammatory changes are seen. The stomach and small bowel appear within normal limits. Vascular/Lymphatic: Aortic atherosclerosis. No enlarged abdominal or pelvic lymph nodes. Reproductive: Status post hysterectomy. No adnexal masses. Other: No abdominal wall hernia or  abnormality. No abdominopelvic ascites. Musculoskeletal: Postsurgical changes are noted in the right hip. No acute bony abnormality is noted. IMPRESSION: Diverticulosis without diverticulitis. No acute abnormality noted. Electronically Signed   By: Inez Catalina M.D.   On: 05/20/2019 20:29   Ct Maxillofacial W Contrast  Result Date: 05/21/2019 CLINICAL DATA:  Oral cavity pain with bleeding and foul odor. EXAM: CT MAXILLOFACIAL WITH CONTRAST TECHNIQUE: Multidetector CT imaging of the maxillofacial structures was performed with intravenous contrast. Multiplanar CT image reconstructions were also generated. CONTRAST:  13mL OMNIPAQUE IOHEXOL 300 MG/ML  SOLN COMPARISON:  None. FINDINGS: The study is mildly motion degraded. Osseous: No acute fracture is identified within limitations of motion artifact. No destructive osseous lesion. No mandibular dislocation. Orbits: Bilateral cataract extraction. No acute inflammatory or traumatic findings. Sinuses: Paranasal sinuses and mastoid air cells are clear. Soft tissues: No oral cavity or pharyngeal mass identified within limitations of dental streak artifact. No peritonsillar or retropharyngeal fluid collection. No parotid or submandibular space inflammation. Calcified atherosclerosis at the carotid bifurcations without evidence of significant stenosis. Limited intracranial: The brain is more fully evaluated on separate dedicated head CT. No abnormal intracranial enhancement is identified. IMPRESSION: No acute abnormality identified. Electronically Signed   By: Logan Bores M.D.   On: 05/21/2019 14:43     Time spent: 35 minutes  Author: Berle Mull, MD [facility] 05/21/2019 5:15 PM  To reach On-call, see care teams to locate the attending and reach out to them via [email or web address]. If 7PM-7AM, please contact night-coverage If you still have difficulty reaching the attending provider, please page the [facility] (Director on Call) for [facility] on amion for  assistance.

## 2019-05-22 LAB — BASIC METABOLIC PANEL
Anion gap: 12 (ref 5–15)
BUN: 18 mg/dL (ref 8–23)
CO2: 26 mmol/L (ref 22–32)
Calcium: 9 mg/dL (ref 8.9–10.3)
Chloride: 103 mmol/L (ref 98–111)
Creatinine, Ser: 0.78 mg/dL (ref 0.44–1.00)
GFR calc Af Amer: >60 mL/min (ref >60)
GFR calc non Af Amer: >60 mL/min (ref >60)
Glucose, Bld: 172 mg/dL — ABNORMAL HIGH (ref 70–99)
Potassium: 3.3 mmol/L — ABNORMAL LOW (ref 3.5–5.1)
Sodium: 141 mmol/L (ref 135–145)

## 2019-05-22 LAB — GLUCOSE, CAPILLARY
Glucose-Capillary: 171 mg/dL — ABNORMAL HIGH (ref 70–99)
Glucose-Capillary: 126 mg/dL — ABNORMAL HIGH (ref 70–99)

## 2019-05-22 LAB — CBC
HCT: 37.4 % (ref 36.0–46.0)
Hemoglobin: 12.6 g/dL (ref 12.0–15.0)
MCH: 30.1 pg (ref 26.0–34.0)
MCHC: 33.7 g/dL (ref 30.0–36.0)
MCV: 89.3 fL (ref 80.0–100.0)
Platelets: 390 10*3/uL (ref 150–400)
RBC: 4.19 MIL/uL (ref 3.87–5.11)
RDW: 12 % (ref 11.5–15.5)
WBC: 8.3 10*3/uL (ref 4.0–10.5)
nRBC: 0 % (ref 0.0–0.2)

## 2019-05-22 MED ORDER — POTASSIUM CHLORIDE 10 MEQ/100ML IV SOLN
10.0000 meq | INTRAVENOUS | Status: AC
  Administered 2019-05-22: 10 meq via INTRAVENOUS
  Filled 2019-05-22: qty 100

## 2019-05-22 MED ORDER — POTASSIUM CHLORIDE 10 MEQ/100ML IV SOLN
10.0000 meq | INTRAVENOUS | Status: AC
  Administered 2019-05-22 (×2): 10 meq via INTRAVENOUS
  Filled 2019-05-22: qty 100

## 2019-05-22 MED ORDER — HYDRALAZINE HCL 20 MG/ML IJ SOLN
5.0000 mg | Freq: Once | INTRAMUSCULAR | Status: AC
  Administered 2019-05-22: 5 mg via INTRAVENOUS
  Filled 2019-05-22: qty 1

## 2019-05-22 NOTE — Progress Notes (Signed)
PT Cancellation Note  Patient Details Name: Melissa Fischer MRN: [id number] DOB: 03-21-1934   Cancelled Treatment:    Reason Eval/Treat Not Completed: Other (comment). Pt in the process of being transferred to [facility]. PT to eval patient once over at [facility].  Kittie Plater, PT, DPT Acute Rehabilitation Services Pager #: [phone] Office #: [phone]    Berline Lopes 05/22/2019, 11:44 AM

## 2019-05-22 NOTE — Progress Notes (Signed)
Report called to Anderson Malta, Therapist, sports at [facility]. Patient transported via ambulance with [place].

## 2019-05-22 NOTE — Progress Notes (Signed)
PROGRESS NOTE    Melissa Fischer  JAS:[id number] DOB: 07/15/1934 DOA: 05/20/2019 PCP: Lajean Manes, MD      Brief Narrative:  Melissa Fischer is a 85 y.o. F with dementia, DM, and COPD who presented from SNF with confusion and slurred speech.  In the ER, repeat CT head (had had one 4 days prior) showed no stroke. COVID screening positive.  Not hypoxic, no CXR performed.      Assessment & Plan:  Coronavirus pneumonitis without acute hypoxic respiratory failure In setting of ongoing 2020 COVID-19 pandemic.  Not on O2.  -VTE PPx with Lovenox, standard dose -Continue Zinc and Vitamin C -Daily d-dimer, ferritin and CRP  COVID-19 Labs Recent Labs    05/21/19 0612  DDIMER 1.46*  FERRITIN 347*  LDH 161  CRP 9.0*      Acute metabolic encephalopathy Dementia From COVID.   Normally talkative but oriented only to self, ambulatory at baseline, participates in self cares.    Here, repeat CT head negative for stroke.  B12 mildly low, on repletion, ammonia normal.  Had a similar reaction reportedly with influenza a few years ago. Has been unable to tolerate Exelon, Namenda or Aricept in the past reportedly.  Treated with as needed ativan at her memory care unit.  Diabetes Glucose adequate control -Continue SSI correction insulin   COPD No active disease -Continue Dulera   Hypokalemia Mild -Replete K  Dental infection CT maxillofacial unremarkable, no abscess. -Continue clindamycin          MDM and disposition: The below labs and imaging reports were reviewed and summarized above.  Medication management as above.  The patient was admitted with change in mentation, due to [place].  She will need placement for rehab.          DVT prophylaxis: Lovenox Code Status: FULL Family Communication:     Consultants:     Procedures:     Antimicrobials:       Subjective:   Objective: Vitals:   05/21/19 1447 05/21/19 1617 05/21/19 2229 05/22/19  0552  BP:  (!) 148/77 (!) 169/94 (!) 177/65  Pulse:  89 85 82  Resp:      Temp: 97.9 F (36.6 C) 98.2 F (36.8 C) 98.2 F (36.8 C) (!) 97.5 F (36.4 C)  TempSrc:  Axillary Oral Oral  SpO2:  95% 98%   Weight:      Height:        Intake/Output Summary (Last 24 hours) at 05/22/2019 0735 Last data filed at 05/22/2019 0600 Gross per 24 hour  Intake 1445.94 ml  Output --  Net 1445.94 ml   Filed Weights   05/20/19 2134 05/20/19 2349  Weight: 68.9 kg 63.2 kg    Examination:     Data Reviewed: I have personally reviewed following labs and imaging studies:  CBC: Recent Labs  Lab 05/20/19 1615 05/21/19 0030 05/22/19 0501  WBC 11.4* 11.0* 8.3  NEUTROABS 7.9* 6.9  --   HGB 12.8 12.7 12.6  HCT 38.8 36.8 37.4  MCV 90.7 86.6 89.3  PLT 393 358 419   Basic Metabolic Panel: Recent Labs  Lab 05/20/19 1615 05/21/19 0030 05/22/19 0501  NA 144 142 141  K 3.6 3.5 3.3*  CL 107 105 103  CO2 25 20* 26  GLUCOSE 231* 212* 172*  BUN 28* 22 18  CREATININE 0.90 0.87 0.78  CALCIUM 10.1 9.5 9.0   GFR: Estimated Creatinine Clearance: 53.2 mL/min (by C-G formula based on SCr of 0.78  mg/dL). Liver Function Tests: Recent Labs  Lab 05/20/19 1615  AST 14*  ALT 14  ALKPHOS 83  BILITOT 0.6  PROT 6.7  ALBUMIN 3.1*   No results for input(s): LIPASE, AMYLASE in the last 168 hours. Recent Labs  Lab 05/21/19 0030  AMMONIA 24   Coagulation Profile: Recent Labs  Lab 05/20/19 1615  INR 1.1   Cardiac Enzymes: No results for input(s): CKTOTAL, CKMB, CKMBINDEX, TROPONINI in the last 168 hours. BNP (last 3 results) No results for input(s): PROBNP in the last 8760 hours. HbA1C: No results for input(s): HGBA1C in the last 72 hours. CBG: Recent Labs  Lab 05/21/19 0225 05/21/19 0829 05/21/19 1148 05/21/19 1643 05/21/19 2237  GLUCAP 212* 202* 161* 127* 129*   Lipid Profile: No results for input(s): CHOL, HDL, LDLCALC, TRIG, CHOLHDL, LDLDIRECT in the last 72 hours. Thyroid  Function Tests: Recent Labs    05/20/19 1600  TSH 2.207   Anemia Panel: Recent Labs    05/21/19 0030 05/21/19 0612  VITAMINB12 242  --   FERRITIN  --  347*   Urine analysis:    Component Value Date/Time   COLORURINE YELLOW 05/20/2019 2132   APPEARANCEUR HAZY (A) 05/20/2019 2132   LABSPEC 1.038 (H) 05/20/2019 2132   PHURINE 5.0 05/20/2019 2132   GLUCOSEU 150 (A) 05/20/2019 2132   HGBUR NEGATIVE 05/20/2019 2132   BILIRUBINUR NEGATIVE 05/20/2019 2132   KETONESUR 20 (A) 05/20/2019 2132   PROTEINUR NEGATIVE 05/20/2019 2132   NITRITE NEGATIVE 05/20/2019 2132   LEUKOCYTESUR NEGATIVE 05/20/2019 2132   Sepsis Labs: @LABRCNTIP (procalcitonin:4,lacticacidven:4)  ) Recent Results (from the past 240 hour(s))  Blood Cultures (routine x 2)     Status: None (Preliminary result)   Collection Time: 05/20/19  3:07 PM  Result Value Ref Range Status   Specimen Description BLOOD LEFT ANTECUBITAL  Final   Special Requests   Final    BOTTLES DRAWN AEROBIC AND ANAEROBIC Blood Culture adequate volume   Culture   Final    NO GROWTH < 24 HOURS Performed at [facility], [place] [address] [place], [place] [address]    Report Status PENDING  Incomplete  Blood Cultures (routine x 2)     Status: None (Preliminary result)   Collection Time: 05/20/19  5:30 PM  Result Value Ref Range Status   Specimen Description BLOOD RIGHT WRIST  Final   Special Requests   Final    BOTTLES DRAWN AEROBIC ONLY Blood Culture results may not be optimal due to an inadequate volume of blood received in culture bottles   Culture   Final    NO GROWTH < 24 HOURS Performed at [facility], [place] [address] [place],  [address]    Report Status PENDING  Incomplete  SARS Coronavirus 2 (CEPHEID- Performed in [place] hospital lab), Hosp Order     Status: Abnormal   Collection Time: 05/20/19 11:01 PM  Result Value Ref Range Status   SARS Coronavirus 2 POSITIVE (A) NEGATIVE Final    Comment: RESULT  CALLED TO, READ BACK BY AND VERIFIED WITH: C. THOMPSON,CHARGE RN 0031 05/21/2019 T. TYSOR (NOTE) If result is NEGATIVE SARS-CoV-2 target nucleic acids are NOT DETECTED. The SARS-CoV-2 RNA is generally detectable in upper and lower  respiratory specimens during the acute phase of infection. The lowest  concentration of SARS-CoV-2 viral copies this assay can detect is 250  copies / mL. A negative result does not preclude SARS-CoV-2 infection  and should not be used as  the sole basis for treatment or other  patient management decisions.  A negative result may occur with  improper specimen collection / handling, submission of specimen other  than nasopharyngeal swab, presence of viral mutation(s) within the  areas targeted by this assay, and inadequate number of viral copies  (<250 copies / mL). A negative result must be combined with clinical  observations, patient history, and epidemiological information. If result is POSITIVE SARS-CoV-2 target nucleic acids are D ETECTED. The SARS-CoV-2 RNA is generally detectable in upper and lower  respiratory specimens during the acute phase of infection.  Positive  results are indicative of active infection with SARS-CoV-2.  Clinical  correlation with patient history and other diagnostic information is  necessary to determine patient infection status.  Positive results do  not rule out bacterial infection or co-infection with other viruses. If result is PRESUMPTIVE POSTIVE SARS-CoV-2 nucleic acids MAY BE PRESENT.   A presumptive positive result was obtained on the submitted specimen  and confirmed on repeat testing.  While 2019 novel coronavirus  (SARS-CoV-2) nucleic acids may be present in the submitted sample  additional confirmatory testing may be necessary for epidemiological  and / or clinical management purposes  to differentiate between  SARS-CoV-2 and other Sarbecovirus currently known to infect humans.  If clinically indicated additional  testing with an alternate test  methodology [phone]) is advised. The SARS-CoV-2 RNA is generally  detectable in upper and lower respiratory specimens during the acute  phase of infection. The expected result is Negative. Fact Sheet for Patients:  StrictlyIdeas.no Fact Sheet for Healthcare Providers: BankingDealers.co.za This test is not yet approved or cleared by the [place] FDA and has been authorized for detection and/or diagnosis of SARS-CoV-2 by FDA under an Emergency Use Authorization (EUA).  This EUA will remain in effect (meaning this test can be used) for the duration of the COVID-19 declaration under Section 564(b)(1) of the Act, 21 U.S.C. section 360bbb-3(b)(1), unless the authorization is terminated or revoked sooner. Performed at [facility], [place] [address], [place], [place] [address]   MRSA PCR Screening     Status: None   Collection Time: 05/21/19 12:52 AM  Result Value Ref Range Status   MRSA by PCR NEGATIVE NEGATIVE Final    Comment:        The GeneXpert MRSA Assay (FDA approved for NASAL specimens only), is one component of a comprehensive MRSA colonization surveillance program. It is not intended to diagnose MRSA infection nor to guide or monitor treatment for MRSA infections. Performed at [facility], [place] [address], [place], [place] [address]          Radiology Studies: Ct Head Wo Contrast  Result Date: 05/21/2019 CLINICAL DATA:  Altered mental status, right-sided weakness, and slurred speech. EXAM: CT HEAD WITHOUT CONTRAST TECHNIQUE: Contiguous axial images were obtained from the base of the skull through the vertex without intravenous contrast. COMPARISON:  05/20/2019 FINDINGS: Brain: There is no evidence of acute infarct, intracranial hemorrhage, mass, midline shift, or extra-axial fluid collection. Patchy to confluent cerebral white matter hypodensities are unchanged and nonspecific  but compatible with moderate to severe chronic small vessel ischemic disease. Moderate to severe cerebral atrophy is again noted. Vascular: Calcified atherosclerosis at the skull base. No hyperdense vessel. Skull: No acute fracture or suspicious osseous lesion. Chronic deformity of the right zygomatic arch. Sinuses/Orbits: Paranasal sinuses and mastoid air cells are clear. Bilateral cataract extraction. Other: None. IMPRESSION: 1. No evidence of acute intracranial  abnormality. 2. Moderate to severe chronic small vessel ischemic disease and cerebral atrophy. Electronically Signed   By: Logan Bores M.D.   On: 05/21/2019 14:47   Ct Head Wo Contrast  Result Date: 05/20/2019 CLINICAL DATA:  Fall neck pain and altered mental status EXAM: CT HEAD WITHOUT CONTRAST CT CERVICAL SPINE WITHOUT CONTRAST TECHNIQUE: Multidetector CT imaging of the head and cervical spine was performed following the standard protocol without intravenous contrast. Multiplanar CT image reconstructions of the cervical spine were also generated. COMPARISON:  CT head and cervical spine 05/08/2019 FINDINGS: CT HEAD FINDINGS Brain: There is no mass, hemorrhage or extra-axial collection. There is generalized atrophy without lobar predilection. There is hypoattenuation of the periventricular white matter, most commonly indicating chronic ischemic microangiopathy. Vascular: No abnormal hyperdensity of the major intracranial arteries or dural venous sinuses. No intracranial atherosclerosis. Skull: The visualized skull base, calvarium and extracranial soft tissues are normal. Sinuses/Orbits: No fluid levels or advanced mucosal thickening of the visualized paranasal sinuses. No mastoid or middle ear effusion. The orbits are normal. CT CERVICAL SPINE FINDINGS Alignment: Grade 1 anterolisthesis at C4-5. Skull base and vertebrae: No acute fracture. Soft tissues and spinal canal: No prevertebral fluid or swelling. No visible canal hematoma. Disc levels:  Multilevel moderate-to-severe facet hypertrophy. Upper chest: No pneumothorax, pulmonary nodule or pleural effusion. Other: Normal visualized paraspinal cervical soft tissues. IMPRESSION: 1. No acute abnormality of the head or cervical spine. 2. Chronic ischemic microangiopathy and generalized volume loss. 3. Multilevel cervical facet arthrosis with grade 1 anterolisthesis at C4-5, unchanged. Electronically Signed   By: Ulyses Jarred M.D.   On: 05/20/2019 20:30   Ct Cervical Spine Wo Contrast  Result Date: 05/20/2019 CLINICAL DATA:  Fall neck pain and altered mental status EXAM: CT HEAD WITHOUT CONTRAST CT CERVICAL SPINE WITHOUT CONTRAST TECHNIQUE: Multidetector CT imaging of the head and cervical spine was performed following the standard protocol without intravenous contrast. Multiplanar CT image reconstructions of the cervical spine were also generated. COMPARISON:  CT head and cervical spine 05/08/2019 FINDINGS: CT HEAD FINDINGS Brain: There is no mass, hemorrhage or extra-axial collection. There is generalized atrophy without lobar predilection. There is hypoattenuation of the periventricular white matter, most commonly indicating chronic ischemic microangiopathy. Vascular: No abnormal hyperdensity of the major intracranial arteries or dural venous sinuses. No intracranial atherosclerosis. Skull: The visualized skull base, calvarium and extracranial soft tissues are normal. Sinuses/Orbits: No fluid levels or advanced mucosal thickening of the visualized paranasal sinuses. No mastoid or middle ear effusion. The orbits are normal. CT CERVICAL SPINE FINDINGS Alignment: Grade 1 anterolisthesis at C4-5. Skull base and vertebrae: No acute fracture. Soft tissues and spinal canal: No prevertebral fluid or swelling. No visible canal hematoma. Disc levels: Multilevel moderate-to-severe facet hypertrophy. Upper chest: No pneumothorax, pulmonary nodule or pleural effusion. Other: Normal visualized paraspinal cervical  soft tissues. IMPRESSION: 1. No acute abnormality of the head or cervical spine. 2. Chronic ischemic microangiopathy and generalized volume loss. 3. Multilevel cervical facet arthrosis with grade 1 anterolisthesis at C4-5, unchanged. Electronically Signed   By: Ulyses Jarred M.D.   On: 05/20/2019 20:30   Ct Abdomen Pelvis W Contrast  Result Date: 05/20/2019 CLINICAL DATA:  Fall several days ago with persistent weakness and abdominal pain EXAM: CT ABDOMEN AND PELVIS WITH CONTRAST TECHNIQUE: Multidetector CT imaging of the abdomen and pelvis was performed using the standard protocol following bolus administration of intravenous contrast. CONTRAST:  133mL OMNIPAQUE IOHEXOL 300 MG/ML  SOLN COMPARISON:  None. FINDINGS: Lower chest: No  acute abnormality. Hepatobiliary: No focal liver abnormality is seen. No gallstones, gallbladder wall thickening, or biliary dilatation. Pancreas: Unremarkable. No pancreatic ductal dilatation or surrounding inflammatory changes. Spleen: Normal in size without focal abnormality. Adrenals/Urinary Tract: Adrenal glands are within normal limits. The kidneys are well visualized bilaterally. No renal calculi or obstructive changes are seen. Normal excretion is noted bilaterally. Stomach/Bowel: Mild diverticular changes noted. There are changes suggestive of prior appendectomy. Clinical correlation is recommended. No inflammatory changes are seen. The stomach and small bowel appear within normal limits. Vascular/Lymphatic: Aortic atherosclerosis. No enlarged abdominal or pelvic lymph nodes. Reproductive: Status post hysterectomy. No adnexal masses. Other: No abdominal wall hernia or abnormality. No abdominopelvic ascites. Musculoskeletal: Postsurgical changes are noted in the right hip. No acute bony abnormality is noted. IMPRESSION: Diverticulosis without diverticulitis. No acute abnormality noted. Electronically Signed   By: Inez Catalina M.D.   On: 05/20/2019 20:29   Ct Maxillofacial W  Contrast  Result Date: 05/21/2019 CLINICAL DATA:  Oral cavity pain with bleeding and foul odor. EXAM: CT MAXILLOFACIAL WITH CONTRAST TECHNIQUE: Multidetector CT imaging of the maxillofacial structures was performed with intravenous contrast. Multiplanar CT image reconstructions were also generated. CONTRAST:  60mL OMNIPAQUE IOHEXOL 300 MG/ML  SOLN COMPARISON:  None. FINDINGS: The study is mildly motion degraded. Osseous: No acute fracture is identified within limitations of motion artifact. No destructive osseous lesion. No mandibular dislocation. Orbits: Bilateral cataract extraction. No acute inflammatory or traumatic findings. Sinuses: Paranasal sinuses and mastoid air cells are clear. Soft tissues: No oral cavity or pharyngeal mass identified within limitations of dental streak artifact. No peritonsillar or retropharyngeal fluid collection. No parotid or submandibular space inflammation. Calcified atherosclerosis at the carotid bifurcations without evidence of significant stenosis. Limited intracranial: The brain is more fully evaluated on separate dedicated head CT. No abnormal intracranial enhancement is identified. IMPRESSION: No acute abnormality identified. Electronically Signed   By: Logan Bores M.D.   On: 05/21/2019 14:43   Dg Chest Port 1 View  Result Date: 05/20/2019 CLINICAL DATA:  Altered mental status. EXAM: PORTABLE CHEST 1 VIEW COMPARISON:  Radiograph of May 08, 2019. FINDINGS: The heart size and mediastinal contours are within normal limits. Both lungs are clear. No pneumothorax or pleural effusion is noted. Left-sided pacemaker is unchanged in position. The visualized skeletal structures are unremarkable. IMPRESSION: No active disease. Electronically Signed   By: Marijo Conception M.D.   On: 05/20/2019 16:46        Scheduled Meds:  chlorhexidine  15 mL Mouth Rinse BID   enoxaparin (LOVENOX) injection  40 mg Subcutaneous Q24H   insulin aspart  0-5 Units Subcutaneous QHS    insulin aspart  0-9 Units Subcutaneous TID WC   mouth rinse  15 mL Mouth Rinse q12n4p   mometasone-formoterol  2 puff Inhalation BID   sodium chloride flush  3 mL Intravenous Q12H   Continuous Infusions:  clindamycin (CLEOCIN) IV 600 mg (05/22/19 0545)   lactated ringers 100 mL/hr at 05/22/19 0145     LOS: 1 day    Time spent:      Edwin Dada, MD [facility] 05/22/2019, 2:45 PM     Please page through [place]:  [email or web address] If 7PM-7AM, please contact night-coverage

## 2019-05-22 NOTE — Evaluation (Addendum)
Occupational Therapy Evaluation Patient Details Name: Melissa Fischer MRN: [id number] DOB: 12/19/1934 Today's Date: 05/22/2019    History of Present Illness 85 y.o. F with dementia, DM, and COPD who presented from SNF with confusion and slurred speech. CT negative for acute findings. COVID screening positive.    Clinical Impression   PTA, pt was living at [facility] and performed self feeding and grooming, required assistance for bathing/dressing, and used a RW for functional mobility; information collected from pt's daughter, [name]. Pt currently requiring Total A for ADLs and bed mobility. Pt with increased confusion and difficulty participating in grooming and self feeding at bed level. Repositioned pt to optimize upright position in bed; pt with tendency to lean and bend neck right. Noting pt with dried mucus and blood in her mouth and she seemed to like bringing a cold wash cloth to her lips and mouth. Pt would benefit from further acute OT to facilitate safe dc. Recommend dc to SNF for further OT to optimize safety, independence with ADLs, and return to PLOF.      Follow Up Recommendations  SNF;Supervision/Assistance - 24 hour    Equipment Recommendations  None recommended by OT    Recommendations for Other Services PT consult;Speech consult     Precautions / Restrictions Precautions Precautions: Fall;Other (comment)(Confusion) Precaution Comments: Pt with fall on mother's day per daughter Restrictions Weight Bearing Restrictions: No      Mobility Bed Mobility Overal bed mobility: Needs Assistance Bed Mobility: Supine to Sit;Sit to Supine     Supine to sit: Total assist;+2 for physical assistance Sit to supine: Total assist;+2 for physical assistance      Transfers                 General transfer comment: Defered for safety    Balance Overall balance assessment: Needs assistance Sitting-balance support: No upper extremity supported;Feet supported Sitting  balance-Leahy Scale: Zero                                     ADL either performed or assessed with clinical judgement   ADL Overall ADL's : Needs assistance/impaired Eating/Feeding: Total assistance Eating/Feeding Details (indicate cue type and reason): Attempting self feeding. Pt not grabbing spoon and saying "cold" when ice was placed in mouth. Grooming: Total assistance;Minimal assistance;Bed level;Wash/dry face;Oral care Grooming Details (indicate cue type and reason): Pt grabbing wash clothe when placed in visual field and bringing to her mouth to wipe her lips. Pt requiring Total A for oral care and would grab tooth brush to push it away. Pt stating "I dont understand". Pt with sores at her mouth and unsure of pain level with oral care. Pt seemed to appreiciate cold wash clothe at lips and mouth                               General ADL Comments: Total A for ADLs     Vision Baseline Vision/History: No visual deficits(Cataract surgery)       Perception     Praxis      Pertinent Vitals/Pain Pain Assessment: Faces Faces Pain Scale: Hurts whole lot Pain Location: Mouth; "ouch" Pain Descriptors / Indicators: Constant;Discomfort;Grimacing;Guarding;Moaning Pain Intervention(s): Monitored during session;Repositioned     Hand Dominance Right   Extremity/Trunk Assessment Upper Extremity Assessment Upper Extremity Assessment: Generalized weakness   Lower Extremity Assessment Lower Extremity Assessment:  Defer to PT evaluation   Cervical / Trunk Assessment Cervical / Trunk Assessment: Kyphotic;Other exceptions Cervical / Trunk Exceptions: tends to flex neck to right   Communication Communication Communication: No difficulties   Cognition Arousal/Alertness: Awake/alert Behavior During Therapy: Restless Overall Cognitive Status: History of cognitive impairments - at baseline Area of Impairment: Orientation;Attention;Memory;Following  commands;Safety/judgement;Awareness;Problem solving                 Orientation Level: Place;Time;Situation;Disoriented to Current Attention Level: Focused Memory: Decreased recall of precautions;Decreased short-term memory Following Commands: Follows one step commands inconsistently;Follows one step commands with increased time Safety/Judgement: Decreased awareness of safety;Decreased awareness of deficits Awareness: Intellectual Problem Solving: Slow processing;Difficulty sequencing;Requires verbal cues;Requires tactile cues;Decreased initiation General Comments: Pt with difficulty attending to ADLs or conversation. stating words such as "ouch" or "cold". Pt with baseline dementia. Daughter reports that pt has had cognitive decline since last weekend.    General Comments  VSS on RA    Exercises     Shoulder Instructions      Home Living Family/patient expects to be discharged to:: Skilled nursing facility                                 Additional Comments: [facility]      Prior Functioning/Environment Level of Independence: Needs assistance  Gait / Transfers Assistance Needed: Uses RW ADL's / Homemaking Assistance Needed: Participates in grooming and self feeding. Assist with bathing and dressing   Comments: Information from [name] - her daughter. progressively getting worse since last weekend        OT Problem List: Decreased strength;Decreased range of motion;Decreased activity tolerance;Impaired balance (sitting and/or standing);Decreased cognition;Decreased safety awareness;Decreased knowledge of use of DME or AE;Decreased knowledge of precautions;Pain      OT Treatment/Interventions: Therapeutic exercise;Self-care/ADL training;DME and/or AE instruction;Energy conservation;Therapeutic activities;Patient/family education    OT Goals(Current goals can be found in the care plan section) Acute Rehab OT Goals Patient Stated Goal: [name] (Daughter)  "get up walking again" OT Goal Formulation: With family Time For Goal Achievement: 05/22/19 Potential to Achieve Goals: Good  OT Frequency: Min 2X/week   Barriers to D/C:            Co-evaluation PT/OT/SLP Co-Evaluation/Treatment: Yes Reason for Co-Treatment: Necessary to address cognition/behavior during functional activity;Complexity of the patient's impairments (multi-system involvement)   OT goals addressed during session: ADL's and self-care SLP goals addressed during session: Swallowing    AM-PAC OT "6 Clicks" Daily Activity     Outcome Measure Help from another person eating meals?: Total Help from another person taking care of personal grooming?: A Lot Help from another person toileting, which includes using toliet, bedpan, or urinal?: Total Help from another person bathing (including washing, rinsing, drying)?: Total Help from another person to put on and taking off regular upper body clothing?: Total Help from another person to put on and taking off regular lower body clothing?: Total 6 Click Score: 7   End of Session Nurse Communication: Mobility status;Other (comment)(Attempted oral care)  Activity Tolerance: Patient limited by lethargy;Treatment limited secondary to agitation Patient left: in bed;with call bell/phone within reach;with bed alarm set  OT Visit Diagnosis: Unsteadiness on feet (R26.81);Other abnormalities of gait and mobility (R26.89);Muscle weakness (generalized) (M62.81);Other symptoms and signs involving cognitive function;Pain Pain - part of body: (Mouth; grimacing and gaurding)                Time: 0086-7619  OT Time Calculation (min): 11 min Charges:  OT General Charges $OT Visit: 1 Visit OT Evaluation $OT Eval Moderate Complexity: [place], OTR/L Acute Rehab Pager: [phone] Office: [place] 05/22/2019, 4:22 PM

## 2019-05-22 NOTE — Progress Notes (Addendum)
[facility] Progress Note  Patient: Melissa Fischer BWL:[id number]   PCP: Lajean Manes, MD DOB: [birth year]   DOA: 05/20/2019   DOS: 05/22/2019   Date of Service: the patient was seen and examined on 05/22/2019  Brief hospital course: Pt. with PMH of Alzheimer dementia with behavioral disturbance, type 2 diabetes mellitus, COPD, and history of mild renal insufficiency; admitted on 05/20/2019, presented with complaint of confusion and slurred speech, was found to have failure to thrive and severe dehydration. Currently further plan is continue IV hydration and further work-up.  Subjective: Answering questions.  Following commands.  No nausea no vomiting.  No acute event overnight.  Still not participating in care still not taking medicines.  Assessment and Plan: 1. Acute metabolic encephalopathy  COVID 19 infection associated with encephalopathy Patient is said to be ambulatory, disoriented but talkative, and requires assistance with ADL's at her baseline, now presenting with 2 days of decreased speech, dysarthria, right-sided weakness, and not walking per report of her daughter. She had head CT in ED that was negative for acute findings, normal TSH, UA not consistent with infection, no meningismus  Unable to get MRI brain, repeat CT scan negative. Mildly low B12 level which is replaced. Normal ammonia. More likely infection COVID associated encephalopathy. Daughter mentions that 2 years ago when the patient had influenza she had a similar reaction with weakness slurred speech and confusion. Mentation improved but still not back to baseline.  Still not participating in care. Continue neuro checks,  2. Alzheimer's dementia  - She was unable to tolerate Exelon, Namenda, or Aricept  - She is treated with as-needed Ativan at the nursing home  - Calm and cooperative on exam but not participating in care with nursing.  3. Type II DM  - No recent A1c on file - Managed with metformin at the  nursing home, held on admission  - Use a low-intensity SSI with Novolog while in hospital    4. COPD  - No cough or wheeze on admission  - Continue ICS/LABA and as-needed albuterol   5.  Dental infection. - Treat with IV clindamycin while the patient is currently n.p.o. CT maxillofacial negative for any soft tissue abscess or significant dental caries. We will continue the antibiotics for a total of 5 days.  6.  Failure to thrive, dehydration. Body mass index is 21.19 kg/m.  - Poor oral intake. - Will hydrate with IV LR.  Diet: N.p.o. for now speech therapy evaluation pending DVT Prophylaxis: subcutaneous Heparin  Advance goals of care discussion: Full code  Family Communication: no family was present at bedside, at the time of interview.  Discussed with daughter on 05/22/2019. Informed her regarding transfer to [facility].  Disposition:  Discharge to SNF.  Consultants: none Procedures: none  Scheduled Meds: . chlorhexidine  15 mL Mouth Rinse BID  . enoxaparin (LOVENOX) injection  40 mg Subcutaneous Q24H  . insulin aspart  0-5 Units Subcutaneous QHS  . insulin aspart  0-9 Units Subcutaneous TID WC  . mouth rinse  15 mL Mouth Rinse q12n4p  . mometasone-formoterol  2 puff Inhalation BID  . sodium chloride flush  3 mL Intravenous Q12H   Continuous Infusions: . clindamycin (CLEOCIN) IV 600 mg (05/22/19 0545)  . lactated ringers 50 mL/hr (05/22/19 1030)   PRN Meds: acetaminophen **OR** acetaminophen, albuterol, antiseptic oral rinse, HYDROcodone-acetaminophen, LORazepam, morphine injection, ondansetron **OR** ondansetron (ZOFRAN) IV, senna-docusate Antibiotics: Anti-infectives (From admission, onward)   Start     Dose/Rate Route Frequency Ordered  Stop   05/21/19 1400  clindamycin (CLEOCIN) IVPB 600 mg     600 mg 100 mL/hr over 30 Minutes Intravenous Every 8 hours 05/21/19 1212         Objective: Physical Exam: Vitals:   05/21/19 1617 05/21/19 2229 05/22/19  0552 05/22/19 0857  BP: (!) 148/77 (!) 169/94 (!) 177/65 (!) 179/73  Pulse: 89 85 82 92  Resp:    18  Temp: 98.2 F (36.8 C) 98.2 F (36.8 C) (!) 97.5 F (36.4 C) 98.4 F (36.9 C)  TempSrc: Axillary Oral Oral Oral  SpO2: 95% 98%  100%  Weight:      Height:        Intake/Output Summary (Last 24 hours) at 05/22/2019 1235 Last data filed at 05/22/2019 1047 Gross per 24 hour  Intake 1903.87 ml  Output 500 ml  Net 1403.87 ml   Filed Weights   05/20/19 2134 05/20/19 2349  Weight: 68.9 kg 63.2 kg   General: Alert, Awake and not oriented time, Place and Person. Appear in moderate distress, affect flat Eyes: PERRL, Conjunctiva normal ENT: Oral Mucosa shows significant collection, dry. Neck: difficult to assess  JVD, no Abnormal Mass Or lumps Cardiovascular: S1 and S2 Present, no Murmur, Peripheral Pulses Present Respiratory: normal respiratory effort, Bilateral Air entry equal and Decreased, no use of accessory muscle, Clear to Auscultation, no Crackles, no wheezes Abdomen: Bowel Sound present, Soft and no tenderness, no hernia Skin: no redness, no Rash, no induration Extremities: no Pedal edema, no calf tenderness Neurologic: Grossly no focal neuro deficit. Bilaterally Equal motor strength  Data Reviewed: CBC: Recent Labs  Lab 05/20/19 1615 05/21/19 0030 05/22/19 0501  WBC 11.4* 11.0* 8.3  NEUTROABS 7.9* 6.9  --   HGB 12.8 12.7 12.6  HCT 38.8 36.8 37.4  MCV 90.7 86.6 89.3  PLT 393 358 595   Basic Metabolic Panel: Recent Labs  Lab 05/20/19 1615 05/21/19 0030 05/22/19 0501  NA 144 142 141  K 3.6 3.5 3.3*  CL 107 105 103  CO2 25 20* 26  GLUCOSE 231* 212* 172*  BUN 28* 22 18  CREATININE 0.90 0.87 0.78  CALCIUM 10.1 9.5 9.0    Liver Function Tests: Recent Labs  Lab 05/20/19 1615  AST 14*  ALT 14  ALKPHOS 83  BILITOT 0.6  PROT 6.7  ALBUMIN 3.1*   No results for input(s): LIPASE, AMYLASE in the last 168 hours. Recent Labs  Lab 05/21/19 0030  AMMONIA 24    Coagulation Profile: Recent Labs  Lab 05/20/19 1615  INR 1.1   Cardiac Enzymes: No results for input(s): CKTOTAL, CKMB, CKMBINDEX, TROPONINI in the last 168 hours. BNP (last 3 results) No results for input(s): PROBNP in the last 8760 hours. CBG: Recent Labs  Lab 05/21/19 0829 05/21/19 1148 05/21/19 1643 05/21/19 2237 05/22/19 0853  GLUCAP 202* 161* 127* 129* 171*   Studies: Ct Head Wo Contrast  Result Date: 05/21/2019 CLINICAL DATA:  Altered mental status, right-sided weakness, and slurred speech. EXAM: CT HEAD WITHOUT CONTRAST TECHNIQUE: Contiguous axial images were obtained from the base of the skull through the vertex without intravenous contrast. COMPARISON:  05/20/2019 FINDINGS: Brain: There is no evidence of acute infarct, intracranial hemorrhage, mass, midline shift, or extra-axial fluid collection. Patchy to confluent cerebral white matter hypodensities are unchanged and nonspecific but compatible with moderate to severe chronic small vessel ischemic disease. Moderate to severe cerebral atrophy is again noted. Vascular: Calcified atherosclerosis at the skull base. No hyperdense vessel. Skull: No acute  fracture or suspicious osseous lesion. Chronic deformity of the right zygomatic arch. Sinuses/Orbits: Paranasal sinuses and mastoid air cells are clear. Bilateral cataract extraction. Other: None. IMPRESSION: 1. No evidence of acute intracranial abnormality. 2. Moderate to severe chronic small vessel ischemic disease and cerebral atrophy. Electronically Signed   By: Logan Bores M.D.   On: 05/21/2019 14:47   Ct Maxillofacial W Contrast  Result Date: 05/21/2019 CLINICAL DATA:  Oral cavity pain with bleeding and foul odor. EXAM: CT MAXILLOFACIAL WITH CONTRAST TECHNIQUE: Multidetector CT imaging of the maxillofacial structures was performed with intravenous contrast. Multiplanar CT image reconstructions were also generated. CONTRAST:  16mL OMNIPAQUE IOHEXOL 300 MG/ML  SOLN  COMPARISON:  None. FINDINGS: The study is mildly motion degraded. Osseous: No acute fracture is identified within limitations of motion artifact. No destructive osseous lesion. No mandibular dislocation. Orbits: Bilateral cataract extraction. No acute inflammatory or traumatic findings. Sinuses: Paranasal sinuses and mastoid air cells are clear. Soft tissues: No oral cavity or pharyngeal mass identified within limitations of dental streak artifact. No peritonsillar or retropharyngeal fluid collection. No parotid or submandibular space inflammation. Calcified atherosclerosis at the carotid bifurcations without evidence of significant stenosis. Limited intracranial: The brain is more fully evaluated on separate dedicated head CT. No abnormal intracranial enhancement is identified. IMPRESSION: No acute abnormality identified. Electronically Signed   By: Logan Bores M.D.   On: 05/21/2019 14:43     Time spent: 35 minutes  Author: Berle Mull, MD [facility] 05/22/2019 12:35 PM  To reach On-call, see care teams to locate the attending and reach out to them via [email or web address]. If 7PM-7AM, please contact night-coverage If you still have difficulty reaching the attending provider, please page the [facility] (Director on Call) for [facility] on amion for assistance.
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
## 2019-05-22 NOTE — Progress Notes (Signed)
OT Cancellation Note  Patient Details Name: Melissa Fischer MRN: [id number] DOB: Apr 25, 1934   Cancelled Treatment:    Reason Eval/Treat Not Completed: Other (comment)(Pt being transerred to [facility] this AM. Pt to be seen at [facility].)   Darryl Nestle) Marsa Aris OTR/L Acute Rehabilitation Services Pager: [phone] Office: [place] 05/22/2019, 11:44 AM

## 2019-05-22 NOTE — Evaluation (Signed)
Clinical/Bedside Swallow Evaluation Patient Details  Name: Melissa Fischer MRN: [id number] Date of Birth: 1934/12/23  Today's Date: 05/22/2019 Time: SLP Start Time (ACUTE ONLY): 1529 SLP Stop Time (ACUTE ONLY): 1540 SLP Time Calculation (min) (ACUTE ONLY): 11 min  Past Medical History:  Past Medical History:  Diagnosis Date  . Alzheimer disease ([place])   . Bronchitis   . COPD (chronic obstructive pulmonary disease) ([place])   . Depression   . Diabetes ([place])   . Hypercholesteremia   . Memory loss   . Pacemaker    [employer] DOI 2011  . Peripheral neuropathy   . Renal disease   . Shoulder pain, left   . Syncope    Past Surgical History:  Past Surgical History:  Procedure Laterality Date  . HIP PINNING,CANNULATED Right 04/26/2016   Procedure: CANNULATED HIP PINNING;  Surgeon: Tania Ade, MD;  Location: [facility] ORS;  Service: Orthopedics;  Laterality: Right;  . no surgical history    . PACEMAKER PLACEMENT     HPI:  85 y.o. female with PMH of Alzheimers dementia with behavioral disturbance, type 2 diabetes mellitus, COPD, and history of mild renal insufficiency; admitted on 05/20/2019, presented with complaint of confusion and slurred speech, was found to have failure to thrive and severe dehydration.  Covid 19 (+); transferred to [place] 5/24.  Pt with acute metabolic encephalopathy, CT negative for acute event.    Assessment / Plan / Recommendation Clinical Impression  Co-treated with OT in an effort to improve participation. Pt confused, near tears, difficult to pursuade to allow oral care or full swallow assessment.  Voice clear; no focal cranial nerve deficits; no dysarthria. LIps and tongue coated in dried mucus and blood; pt refused to allow brushing of her teeth.  She used a wet washcloth to broadly wipe her face/mouth, but did not follow commands nor engage is self oral care with intention.  She c/o mouth pain upon cloth/toothbrush contacting lips and tongue.  Ice chips were ejected from  mouth; she allowed a tspn of water, but it leaked from oral cavity.  No spontaneous swallows observed.  Assessment discontinued due to pt's refusals, increasing agitation.  Recommend ongoing attempts at oral care; allow chip/sips if pt amenable and provide meds whole in puree.  SLP will f/u next date for improved willingness to participate.  SLP Visit Diagnosis: Dysphagia, unspecified (R13.10)    Aspiration Risk       Diet Recommendation   ice chips/ sips of water  Medication Administration: Whole meds with puree    Other  Recommendations Oral Care Recommendations: Oral care QID   Follow up Recommendations Other (comment)(tba)      Frequency and Duration min 3x week  2 weeks       Prognosis Prognosis for Safe Diet Advancement: Good      Swallow Study   General Date of Onset: 05/21/19 HPI: 85 y.o. female with PMH of Alzheimers dementia with behavioral disturbance, type 2 diabetes mellitus, COPD, and history of mild renal insufficiency; admitted on 05/20/2019, presented with complaint of confusion and slurred speech, was found to have failure to thrive and severe dehydration.  Covid 19 (+); transferred to [place] 5/24.  Pt with acute metabolic encephalopathy, CT negative for acute event.  Type of Study: Bedside Swallow Evaluation Previous Swallow Assessment: no Diet Prior to this Study: NPO Temperature Spikes Noted: No Respiratory Status: Room air History of Recent Intubation: No Behavior/Cognition: Alert;Impulsive;Doesn't follow directions;Requires cueing Oral Cavity Assessment: Dried secretions Oral Care Completed by SLP: Other (Comment)(attempted)  Oral Cavity - Dentition: Adequate natural dentition Self-Feeding Abilities: Needs assist Patient Positioning: Upright in bed Baseline Vocal Quality: Normal Volitional Cough: Cognitively unable to elicit Volitional Swallow: Unable to elicit    Oral/Motor/Sensory Function Overall Oral Motor/Sensory Function: Other (comment)(no focal  deficits)   Ice Chips Ice chips: Impaired Presentation: Spoon Oral Phase Impairments: Other (comment)(ejected)   Thin Liquid Thin Liquid: Impaired Presentation: Spoon Oral Phase Impairments: Reduced labial seal(poor retention)    Nectar Thick Nectar Thick Liquid: Not tested   Honey Thick Honey Thick Liquid: Not tested   Puree Puree: Not tested   Solid     Solid: Not tested      [name] 05/22/2019,4:05 PM  Estill Bamberg L. Tivis Ringer, [place] Office number [phone] Pager [phone]

## 2019-05-22 NOTE — Plan of Care (Signed)
Patient stable, IV pain rx tolerated well. Updated and discussed POC with patient's daughter [name], agreeable with plan, denies question/concerns at this time.

## 2019-05-22 NOTE — Plan of Care (Signed)
Pt unable to process information due to Alzheimers/dementia. Frequent simple instructions given during care. No s/s of distress., will continue to monitor.

## 2019-05-22 NOTE — Progress Notes (Signed)
Pt arrived to unit, VSS, NAD noted. Dr. Loleta Books notified via [facility] of arrival.

## 2019-05-23 LAB — CBC
HCT: 36 % (ref 36.0–46.0)
Hemoglobin: 12 g/dL (ref 12.0–15.0)
MCH: 30.2 pg (ref 26.0–34.0)
MCHC: 33.3 g/dL (ref 30.0–36.0)
MCV: 90.5 fL (ref 80.0–100.0)
Platelets: 400 10*3/uL (ref 150–400)
RBC: 3.98 MIL/uL (ref 3.87–5.11)
RDW: 12.1 % (ref 11.5–15.5)
WBC: 10.4 10*3/uL (ref 4.0–10.5)
nRBC: 0 % (ref 0.0–0.2)

## 2019-05-23 LAB — GLUCOSE, CAPILLARY
Glucose-Capillary: 135 mg/dL — ABNORMAL HIGH (ref 70–99)
Glucose-Capillary: 124 mg/dL — ABNORMAL HIGH (ref 70–99)
Glucose-Capillary: 112 mg/dL — ABNORMAL HIGH (ref 70–99)
Glucose-Capillary: 121 mg/dL — ABNORMAL HIGH (ref 70–99)

## 2019-05-23 LAB — BASIC METABOLIC PANEL
Anion gap: 12 (ref 5–15)
BUN: 21 mg/dL (ref 8–23)
CO2: 25 mmol/L (ref 22–32)
Calcium: 8.8 mg/dL — ABNORMAL LOW (ref 8.9–10.3)
Chloride: 104 mmol/L (ref 98–111)
Creatinine, Ser: 0.76 mg/dL (ref 0.44–1.00)
GFR calc Af Amer: >60 mL/min (ref >60)
GFR calc non Af Amer: >60 mL/min (ref >60)
Glucose, Bld: 149 mg/dL — ABNORMAL HIGH (ref 70–99)
Potassium: 3.3 mmol/L — ABNORMAL LOW (ref 3.5–5.1)
Sodium: 141 mmol/L (ref 135–145)

## 2019-05-23 LAB — INTERLEUKIN-6, PLASMA: Interleukin-6, Plasma: 14.7 pg/mL — ABNORMAL HIGH (ref 0.0–12.2)

## 2019-05-23 MED ORDER — POTASSIUM CHLORIDE 10 MEQ/100ML IV SOLN
10.0000 meq | INTRAVENOUS | Status: AC
  Administered 2019-05-23 (×3): 10 meq via INTRAVENOUS
  Filled 2019-05-23 (×4): qty 100

## 2019-05-23 MED ORDER — SODIUM CHLORIDE 0.9 % IV SOLN
INTRAVENOUS | Status: DC
  Administered 2019-05-23 – 2019-05-24 (×3): via INTRAVENOUS

## 2019-05-23 MED ORDER — MOMETASONE FURO-FORMOTEROL FUM 200-5 MCG/ACT IN AERO
2.0000 | INHALATION_SPRAY | Freq: Two times a day (BID) | RESPIRATORY_TRACT | Status: DC
  Administered 2019-05-23 – 2019-05-29 (×10): 2 via RESPIRATORY_TRACT
  Filled 2019-05-23 (×2): qty 8.8

## 2019-05-23 NOTE — Progress Notes (Addendum)
PROGRESS NOTE    Melissa Fischer  DHR:[id number] DOB: November 08, 1934 DOA: 05/20/2019 PCP: Lajean Manes, MD      Brief Narrative:  Melissa Fischer is a 85 y.o. F with dementia, DM, and COPD who presented from SNF with confusion and slurred speech.  In the ER, repeat CT head (had had one 4 days prior) showed no stroke. COVID screening positive.  Not hypoxic, CXR clear.      Assessment & Plan:  Coronavirus pneumonitis without acute hypoxic respiratory failure In setting of ongoing 2020 COVID-19 pandemic.  Not on O2.  -VTE PPx with Lovenox, standard dose -Repeat d-dimer, ferritin and CRP tomorrow     Acute metabolic encephalopathy --> suspect delirium from COVID Dementia Normally talkative but oriented only to self, ambulatory at baseline, participates in self cares.    Here, repeat CT head negative for stroke.  B12 mildly low, on repletion, ammonia normal.  Had a similar reaction reportedly with influenza a few years ago. Has been unable to tolerate Exelon, Namenda or Aricept in the past reportedly.  Treated with as needed ativan at her memory care unit. -Avoid Beers criteria meds -PT eval -Frequent reorientation -SLP eval -IV fluids given inability to take PO at all safely  Diabetes Glucose good -Continue SSI correction insulin   COPD No active disease -Continue Dulera   Hypokalemia Still low -Repeat K  Dental infection CT maxillofacial unremarkable, no abscess. -Continue clindamycin          MDM and disposition: The below labs and imaging reports were reviewed and summarized above.  Medication management as above.  The patient was admitted with delirium from [place].  She is still delirious and unable to handle any PO intake.  We will continue supportive cares, work with SLP to safely begin oral intake.  Simultaneously, will work with SW to arrange safe discharge plan.    At present expect hospitlalization 2-3 days at least before mentation  improved enough to take PO and SW able to arrange safe discharge.        DVT prophylaxis: Loveonx Code Status: FULL Family Communication: Daughter    Consultants:   None  Procedures:   CXR on admission 5/22 -- clear  CT head and c-spine 5/22 -- NAICP  CT head repeat 5/23 -- NAICP  CT maxillofacial 5/23 -- no acute disease  Antimicrobials:   Clindamycin 5/23 >>    Subjective: Disoriented, but verbal.  Appears tired.  No respiratory distress, cough, abdominal pain,hcest pain, dyspnea.    Objective: Vitals:   05/23/19 0600 05/23/19 0700 05/23/19 0800 05/23/19 1050  BP:    126/70  Pulse: 83   82  Resp: 12 11 11    Temp:      TempSrc:      SpO2: 99%   98%  Weight:      Height:        Intake/Output Summary (Last 24 hours) at 05/23/2019 1210 Last data filed at 05/23/2019 1000 Gross per 24 hour  Intake 1163.1 ml  Output 100 ml  Net 1063.1 ml   Filed Weights   05/20/19 2134 05/20/19 2349  Weight: 68.9 kg 63.2 kg    Examination: General appearance: elderly adult female, alert and in no acute distress.   HEENT: Anicteric, conjunctiva pink, lids and lashes normal. No nasal deformity, discharge, epistaxis.  Lips moist, dentition normal, OP tacky dry, no oral lesions, hearing normal.   Skin: Warm and dry.  no jaundice.  No suspicious rashes or lesions. Cardiac: RRR,  nl S1-S2, SEM noted.  Capillary refill is brisk.  JVP normal.  No LE edema.  Radial pulses 2+ and symmetric. Respiratory: Normal respiratory rate and rhythm.  CTAB without rales or wheezes. Abdomen: Abdomen soft.  No TTP. No ascites, distension, hepatosplenomegaly.   MSK: No deformities or effusions. Neuro: Awake and alert.  EOMI, moves all extremities. Speech fluent.    Psych: Sensorium intact and responding to questions, attention normal. Affect normal.  Judgment and insight appear normal.    Data Reviewed: I have personally reviewed following labs and imaging studies:  CBC: Recent Labs  Lab  24-May-2019 1615 05/21/19 0030 05/22/19 0501 05/23/19 0400  WBC 11.4* 11.0* 8.3 10.4  NEUTROABS 7.9* 6.9  --   --   HGB 12.8 12.7 12.6 12.0  HCT 38.8 36.8 37.4 36.0  MCV 90.7 86.6 89.3 90.5  PLT 393 358 390 932   Basic Metabolic Panel: Recent Labs  Lab 05-24-19 1615 05/21/19 0030 05/22/19 0501 05/23/19 0400  NA 144 142 141 141  K 3.6 3.5 3.3* 3.3*  CL 107 105 103 104  CO2 25 20* 26 25  GLUCOSE 231* 212* 172* 149*  BUN 28* 22 18 21   CREATININE 0.90 0.87 0.78 0.76  CALCIUM 10.1 9.5 9.0 8.8*   GFR: Estimated Creatinine Clearance: 53.2 mL/min (by C-G formula based on SCr of 0.76 mg/dL). Liver Function Tests: Recent Labs  Lab 05/24/19 1615  AST 14*  ALT 14  ALKPHOS 83  BILITOT 0.6  PROT 6.7  ALBUMIN 3.1*   No results for input(s): LIPASE, AMYLASE in the last 168 hours. Recent Labs  Lab 05/21/19 0030  AMMONIA 24   Coagulation Profile: Recent Labs  Lab 2019/05/24 1615  INR 1.1   Cardiac Enzymes: No results for input(s): CKTOTAL, CKMB, CKMBINDEX, TROPONINI in the last 168 hours. BNP (last 3 results) No results for input(s): PROBNP in the last 8760 hours. HbA1C: No results for input(s): HGBA1C in the last 72 hours. CBG: Recent Labs  Lab 05/21/19 1643 05/21/19 2237 05/22/19 0853 05/22/19 2117 05/23/19 0819  GLUCAP 127* 129* 171* 126* 135*   Lipid Profile: No results for input(s): CHOL, HDL, LDLCALC, TRIG, CHOLHDL, LDLDIRECT in the last 72 hours. Thyroid Function Tests: Recent Labs    May 24, 2019 1600  TSH 2.207   Anemia Panel: Recent Labs    05/21/19 0030 05/21/19 0612  VITAMINB12 242  --   FERRITIN  --  347*   Urine analysis:    Component Value Date/Time   COLORURINE YELLOW 05-24-2019 2132   APPEARANCEUR HAZY (A) May 24, 2019 2132   LABSPEC 1.038 (H) 2019/05/24 2132   PHURINE 5.0 May 24, 2019 2132   GLUCOSEU 150 (A) 05-24-2019 2132   HGBUR NEGATIVE 05-24-19 2132   BILIRUBINUR NEGATIVE 24-May-2019 2132   KETONESUR 20 (A) 05-24-2019 2132    PROTEINUR NEGATIVE 05-24-2019 2132   NITRITE NEGATIVE 2019-05-24 2132   LEUKOCYTESUR NEGATIVE 05/24/19 2132   Sepsis Labs: @LABRCNTIP (procalcitonin:4,lacticacidven:4)  ) Recent Results (from the past 240 hour(s))  Blood Cultures (routine x 2)     Status: None (Preliminary result)   Collection Time: 05/24/2019  3:07 PM  Result Value Ref Range Status   Specimen Description BLOOD LEFT ANTECUBITAL  Final   Special Requests   Final    BOTTLES DRAWN AEROBIC AND ANAEROBIC Blood Culture adequate volume   Culture   Final    NO GROWTH 3 DAYS Performed at [facility], [place] [address], [place], [place] [address]    Report Status PENDING  Incomplete  Blood Cultures (routine x 2)     Status: None (Preliminary result)   Collection Time: 05/20/19  5:30 PM  Result Value Ref Range Status   Specimen Description BLOOD RIGHT WRIST  Final   Special Requests   Final    BOTTLES DRAWN AEROBIC ONLY Blood Culture results may not be optimal due to an inadequate volume of blood received in culture bottles   Culture   Final    NO GROWTH 3 DAYS Performed at [facility], [place] [address] [place], [place] [address]    Report Status PENDING  Incomplete  SARS Coronavirus 2 (CEPHEID- Performed in [place] hospital lab), Hosp Order     Status: Abnormal   Collection Time: 05/20/19 11:01 PM  Result Value Ref Range Status   SARS Coronavirus 2 POSITIVE (A) NEGATIVE Final    Comment: RESULT CALLED TO, READ BACK BY AND VERIFIED WITH: C. THOMPSON,CHARGE RN 0031 05/21/2019 T. TYSOR (NOTE) If result is NEGATIVE SARS-CoV-2 target nucleic acids are NOT DETECTED. The SARS-CoV-2 RNA is generally detectable in upper and lower  respiratory specimens during the acute phase of infection. The lowest  concentration of SARS-CoV-2 viral copies this assay can detect is 250  copies / mL. A negative result does not preclude SARS-CoV-2 infection  and should not be used as the sole basis for treatment or other  patient  management decisions.  A negative result may occur with  improper specimen collection / handling, submission of specimen other  than nasopharyngeal swab, presence of viral mutation(s) within the  areas targeted by this assay, and inadequate number of viral copies  (<250 copies / mL). A negative result must be combined with clinical  observations, patient history, and epidemiological information. If result is POSITIVE SARS-CoV-2 target nucleic acids are D ETECTED. The SARS-CoV-2 RNA is generally detectable in upper and lower  respiratory specimens during the acute phase of infection.  Positive  results are indicative of active infection with SARS-CoV-2.  Clinical  correlation with patient history and other diagnostic information is  necessary to determine patient infection status.  Positive results do  not rule out bacterial infection or co-infection with other viruses. If result is PRESUMPTIVE POSTIVE SARS-CoV-2 nucleic acids MAY BE PRESENT.   A presumptive positive result was obtained on the submitted specimen  and confirmed on repeat testing.  While 2019 novel coronavirus  (SARS-CoV-2) nucleic acids may be present in the submitted sample  additional confirmatory testing may be necessary for epidemiological  and / or clinical management purposes  to differentiate between  SARS-CoV-2 and other Sarbecovirus currently known to infect humans.  If clinically indicated additional testing with an alternate test  methodology [phone]) is advised. The SARS-CoV-2 RNA is generally  detectable in upper and lower respiratory specimens during the acute  phase of infection. The expected result is Negative. Fact Sheet for Patients:  StrictlyIdeas.no Fact Sheet for Healthcare Providers: BankingDealers.co.za This test is not yet approved or cleared by the [place] FDA and has been authorized for detection and/or diagnosis of SARS-CoV-2 by FDA under  an Emergency Use Authorization (EUA).  This EUA will remain in effect (meaning this test can be used) for the duration of the COVID-19 declaration under Section 564(b)(1) of the Act, 21 U.S.C. section 360bbb-3(b)(1), unless the authorization is terminated or revoked sooner. Performed at [facility], [place] [address], [place], [place] [address]   MRSA PCR Screening     Status: None   Collection  Time: 05/21/19 12:52 AM  Result Value Ref Range Status   MRSA by PCR NEGATIVE NEGATIVE Final    Comment:        The GeneXpert MRSA Assay (FDA approved for NASAL specimens only), is one component of a comprehensive MRSA colonization surveillance program. It is not intended to diagnose MRSA infection nor to guide or monitor treatment for MRSA infections. Performed at [facility], [place] [address], [place], [place] [address]          Radiology Studies: Ct Head Wo Contrast  Result Date: 05/21/2019 CLINICAL DATA:  Altered mental status, right-sided weakness, and slurred speech. EXAM: CT HEAD WITHOUT CONTRAST TECHNIQUE: Contiguous axial images were obtained from the base of the skull through the vertex without intravenous contrast. COMPARISON:  05/20/2019 FINDINGS: Brain: There is no evidence of acute infarct, intracranial hemorrhage, mass, midline shift, or extra-axial fluid collection. Patchy to confluent cerebral white matter hypodensities are unchanged and nonspecific but compatible with moderate to severe chronic small vessel ischemic disease. Moderate to severe cerebral atrophy is again noted. Vascular: Calcified atherosclerosis at the skull base. No hyperdense vessel. Skull: No acute fracture or suspicious osseous lesion. Chronic deformity of the right zygomatic arch. Sinuses/Orbits: Paranasal sinuses and mastoid air cells are clear. Bilateral cataract extraction. Other: None. IMPRESSION: 1. No evidence of acute intracranial abnormality. 2. Moderate to severe chronic small vessel  ischemic disease and cerebral atrophy. Electronically Signed   By: Logan Bores M.D.   On: 05/21/2019 14:47   Ct Maxillofacial W Contrast  Result Date: 05/21/2019 CLINICAL DATA:  Oral cavity pain with bleeding and foul odor. EXAM: CT MAXILLOFACIAL WITH CONTRAST TECHNIQUE: Multidetector CT imaging of the maxillofacial structures was performed with intravenous contrast. Multiplanar CT image reconstructions were also generated. CONTRAST:  44mL OMNIPAQUE IOHEXOL 300 MG/ML  SOLN COMPARISON:  None. FINDINGS: The study is mildly motion degraded. Osseous: No acute fracture is identified within limitations of motion artifact. No destructive osseous lesion. No mandibular dislocation. Orbits: Bilateral cataract extraction. No acute inflammatory or traumatic findings. Sinuses: Paranasal sinuses and mastoid air cells are clear. Soft tissues: No oral cavity or pharyngeal mass identified within limitations of dental streak artifact. No peritonsillar or retropharyngeal fluid collection. No parotid or submandibular space inflammation. Calcified atherosclerosis at the carotid bifurcations without evidence of significant stenosis. Limited intracranial: The brain is more fully evaluated on separate dedicated head CT. No abnormal intracranial enhancement is identified. IMPRESSION: No acute abnormality identified. Electronically Signed   By: Logan Bores M.D.   On: 05/21/2019 14:43        Scheduled Meds:  chlorhexidine  15 mL Mouth Rinse BID   enoxaparin (LOVENOX) injection  40 mg Subcutaneous Q24H   insulin aspart  0-5 Units Subcutaneous QHS   insulin aspart  0-9 Units Subcutaneous TID WC   mouth rinse  15 mL Mouth Rinse q12n4p   mometasone-formoterol  2 puff Inhalation BID   sodium chloride flush  3 mL Intravenous Q12H   Continuous Infusions:  sodium chloride 100 mL/hr at 05/23/19 5093   clindamycin (CLEOCIN) IV 600 mg (05/23/19 0644)   lactated ringers Stopped (05/22/19 1324)     LOS: 2 days     Time spent: 25 minutes  During this encounter: Patient Isolation: Airborne, contact, droplet HCP PPE: Face shield, head covering, N95, gown, gloves, and shoe covers    Edwin Dada, MD [facility] 05/23/2019, 12:10 PM     Please page through [place]:  [email or web address] Password [email or web address] If 7PM-7AM, please  contact night-coverage

## 2019-05-23 NOTE — Progress Notes (Signed)
Speech Language Pathology Dysphagia Treatment Patient Details Name: Melissa Fischer MRN: [id number] DOB: [birth year] Today's Date: 05/23/2019 Time: 0102-7253 SLP Time Calculation (min) (ACUTE ONLY): 15 min  Assessment / Plan / Recommendation Clinical Impression   Pt continues with high levels of irritability, unwillingness to participate and mistrust of staff.  Limited oral care completed before pt pushed away [place] - continues with dried blood/secretions around lips and tip of tongue.  Ice chips were accepted initially but then expectorated.  Enlisted her daughter Melissa Fischer help in an effort to coax participation (via [place]) however, pt remained unwilling to take any POs.  She became increasingly aggravated despite efforts by daughter and this therapist to calm her.  Attempts at feeding ceased.  Doubt pt has mechanical or cognitive based swallowing issues given her behavior, LOA, and overall lack of risk factors other than dementia.  She was eating/drinking without difficulty PTA.  Recommend trials of ice cream Melissa Fischer says this is a favorite food), liquids per pt's preferences.  SLP will continue efforts.     Diet Recommendation    allow sips of liquids per pt's preferences, ice cream.  Hold POs if pt demonstrates s/s of aspiration.    SLP Plan Continue with current plan of care          General Behavior/Cognition: Alert;Agitated;Uncooperative Patient Positioning: Partially reclined HPI: 85 y.o. female with PMH of Alzheimers dementia with behavioral disturbance, type 2 diabetes mellitus, COPD, and history of mild renal insufficiency; admitted on 05/20/2019, presented with complaint of confusion and slurred speech, was found to have failure to thrive and severe dehydration.  Covid 19 (+); transferred to [facility] 5/24.  Pt with acute metabolic encephalopathy, CT negative for acute event.   Oral Cavity - Oral Hygiene Patient is HIGH RISK: Non-ventilated: Order set for Adult Oral Care Protocol  initiated - "High Risk Patients - Non-Ventilated" option selected  (see row information)   Dysphagia Treatment Family/Caregiver Educated: dtr Melissa Fischer via Facetime Treatment Methods: Skilled observation;Patient/caregiver education Patient observed directly with PO's: Yes Type of PO's observed: Ice chips Feeding: Other (Comment)(spit out ice chips) Type of cueing: Verbal;Visual   GO     [name] 05/23/2019, 12:05 PM  Aran Menning L. Tivis Ringer, [place] Office number [phone]

## 2019-05-23 NOTE — Evaluation (Signed)
Physical Therapy Evaluation Patient Details Name: Melissa Fischer MRN: [id number] DOB: 12-10-1934 Today's Date: 05/23/2019   History of Present Illness  85 y.o. F with dementia, DM, and COPD who presented from SNF with confusion and slurred speech. CT negative for acute findings. COVID screening positive. CT of head negative for acute stroke.  Pt dx with COVID pneumonitis without acute hypoxic respiratory failure, acute metabolic encepholopathy, hypokalemia, and dental infection.    Clinical Impression  Pt is guarded in her new environment.  Coaxing techniques used to attempt to get pt EOB/OOB to chair, however, unsuccessful today.  She was ambulatory PTA per daughter (see OT notes for details).  PT will continue to follow acutely for safe mobility progression       Follow Up Recommendations SNF(depends if ALF will take her back)    Equipment Recommendations  None recommended by PT    Recommendations for Other Services   NA    Precautions / Restrictions Precautions Precautions: Fall;Other (comment)(dementia) Precaution Comments: dementia Restrictions Weight Bearing Restrictions: No      Mobility  Bed Mobility Overal bed mobility: Needs Assistance             General bed mobility comments: Repositioned from significant right sidelying to midline and then positioned with pillows to decrease sidelying while in upright position.  Bed positioned in highest HOB mode.  Transfers                 General transfer comment: unable to attempt as unable to coax pt to EOB.                Pertinent Vitals/Pain Pain Assessment: No/denies pain    Home Living Family/patient expects to be discharged to:: Assisted living                 Additional Comments: [facility]    Prior Function Level of Independence: Needs assistance   Gait / Transfers Assistance Needed: Uses RW  ADL's / Homemaking Assistance Needed: Participates in grooming and self feeding. Assist  with bathing and dressing        Hand Dominance   Dominant Hand: Right    Extremity/Trunk Assessment   Upper Extremity Assessment Upper Extremity Assessment: Defer to OT evaluation    Lower Extremity Assessment Lower Extremity Assessment: Generalized weakness    Cervical / Trunk Assessment Cervical / Trunk Assessment: Kyphotic;Other exceptions Cervical / Trunk Exceptions: Pt rotated to the right, head, lateral right neck flexion  Communication   Communication: No difficulties  Cognition Arousal/Alertness: Awake/alert Behavior During Therapy: Flat affect;Agitated Overall Cognitive Status: History of cognitive impairments - at baseline Area of Impairment: Orientation;Attention;Memory;Following commands;Safety/judgement;Awareness;Problem solving                 Orientation Level: Disoriented to;Place;Time;Situation Current Attention Level: Focused Memory: Decreased recall of precautions;Decreased short-term memory Following Commands: Follows one step commands inconsistently Safety/Judgement: Decreased awareness of safety;Decreased awareness of deficits Awareness: Intellectual Problem Solving: Difficulty sequencing;Decreased initiation;Requires verbal cues;Requires tactile cues General Comments: Pt guarded in her new environment, seemingly paranoid, unable to relax and responding with fight or flight with touch and repositioning.               Assessment/Plan    PT Assessment Patient needs continued PT services  PT Problem List Decreased strength;Decreased activity tolerance;Decreased mobility;Decreased balance;Decreased cognition;Decreased knowledge of use of DME;Decreased safety awareness;Decreased knowledge of precautions       PT Treatment Interventions DME instruction;Gait training;Functional mobility training;Therapeutic activities;Therapeutic exercise;Balance training;Cognitive remediation;Patient/family  education    PT Goals (Current goals can be found  in the Care Plan section)  Acute Rehab PT Goals Patient Stated Goal: daughter wants her to stay ambulatory per OT notes (OT spoke with daughter) PT Goal Formulation: Patient unable to participate in goal setting Time For Goal Achievement: 06/06/19 Potential to Achieve Goals: Fair    Frequency Min 3X/week           AM-PAC PT "6 Clicks" Mobility  Outcome Measure Help needed turning from your back to your side while in a flat bed without using bedrails?: Total Help needed moving from lying on your back to sitting on the side of a flat bed without using bedrails?: Total Help needed moving to and from a bed to a chair (including a wheelchair)?: Total Help needed standing up from a chair using your arms (e.g., wheelchair or bedside chair)?: Total Help needed to walk in hospital room?: Total Help needed climbing 3-5 steps with a railing? : Total 6 Click Score: 6    End of Session   Activity Tolerance: Treatment limited secondary to agitation Patient left: in bed;with call bell/phone within reach;with bed alarm set   PT Visit Diagnosis: Muscle weakness (generalized) (M62.81);Difficulty in walking, not elsewhere classified (R26.2)    Time: 5953-9672 PT Time Calculation (min) (ACUTE ONLY): 26 min   Charges:      Wells Guiles B. Nijee Heatwole, PT, DPT  Acute Rehabilitation #[phone] pager #[phone] office   PT Evaluation $PT Eval Moderate Complexity: 1 Mod         05/23/2019, 11:28 AM

## 2019-05-23 NOTE — Progress Notes (Signed)
Attempted to call patients daughter to update her. [name] did not answer. Left a voicemail with a call back number.

## 2019-05-23 NOTE — Progress Notes (Signed)
Spoke with patients daughter over face time and updated her on plan of care. All questions answered.

## 2019-05-24 LAB — BASIC METABOLIC PANEL
Anion gap: 13 (ref 5–15)
BUN: 14 mg/dL (ref 8–23)
CO2: 20 mmol/L — ABNORMAL LOW (ref 22–32)
Calcium: 8.2 mg/dL — ABNORMAL LOW (ref 8.9–10.3)
Chloride: 105 mmol/L (ref 98–111)
Creatinine, Ser: 0.67 mg/dL (ref 0.44–1.00)
GFR calc Af Amer: >60 mL/min (ref >60)
GFR calc non Af Amer: >60 mL/min (ref >60)
Glucose, Bld: 148 mg/dL — ABNORMAL HIGH (ref 70–99)
Potassium: 3.5 mmol/L (ref 3.5–5.1)
Sodium: 138 mmol/L (ref 135–145)

## 2019-05-24 LAB — CBC
HCT: 34.7 % — ABNORMAL LOW (ref 36.0–46.0)
Hemoglobin: 11.4 g/dL — ABNORMAL LOW (ref 12.0–15.0)
MCH: 29.3 pg (ref 26.0–34.0)
MCHC: 32.9 g/dL (ref 30.0–36.0)
MCV: 89.2 fL (ref 80.0–100.0)
Platelets: 387 10*3/uL (ref 150–400)
RBC: 3.89 MIL/uL (ref 3.87–5.11)
RDW: 11.9 % (ref 11.5–15.5)
WBC: 12.6 10*3/uL — ABNORMAL HIGH (ref 4.0–10.5)
nRBC: 0 % (ref 0.0–0.2)

## 2019-05-24 LAB — GLUCOSE, CAPILLARY
Glucose-Capillary: 119 mg/dL — ABNORMAL HIGH (ref 70–99)
Glucose-Capillary: 136 mg/dL — ABNORMAL HIGH (ref 70–99)
Glucose-Capillary: 150 mg/dL — ABNORMAL HIGH (ref 70–99)
Glucose-Capillary: 193 mg/dL — ABNORMAL HIGH (ref 70–99)
Glucose-Capillary: 129 mg/dL — ABNORMAL HIGH (ref 70–99)

## 2019-05-24 LAB — FERRITIN: Ferritin: 293 ng/mL (ref 11–307)

## 2019-05-24 LAB — C-REACTIVE PROTEIN: CRP: 5.9 mg/dL — ABNORMAL HIGH (ref <1.0)

## 2019-05-24 LAB — D-DIMER, QUANTITATIVE: D-Dimer, Quant: 1.51 ug/mL-FEU — ABNORMAL HIGH (ref 0.00–0.50)

## 2019-05-24 LAB — FOLATE RBC
Folate, Hemolysate: 269 ng/mL
Folate, RBC: 747 ng/mL (ref >498)
Hematocrit: 36 % (ref 34.0–46.6)

## 2019-05-24 LAB — URINE CULTURE: Culture: NO GROWTH

## 2019-05-24 MED ORDER — ENSURE ENLIVE PO LIQD
237.0000 mL | Freq: Three times a day (TID) | ORAL | Status: DC
  Administered 2019-05-24 – 2019-05-29 (×6): 237 mL via ORAL

## 2019-05-24 MED ORDER — ENOXAPARIN SODIUM 40 MG/0.4ML ~~LOC~~ SOLN
40.0000 mg | SUBCUTANEOUS | Status: DC
  Administered 2019-05-24 – 2019-05-29 (×6): 40 mg via SUBCUTANEOUS
  Filled 2019-05-24 (×6): qty 0.4

## 2019-05-24 MED ORDER — SODIUM CHLORIDE 0.9 % IV SOLN
INTRAVENOUS | Status: DC | PRN
  Administered 2019-05-24: 1000 mL via INTRAVENOUS

## 2019-05-24 MED ORDER — LIP MEDEX EX OINT
TOPICAL_OINTMENT | CUTANEOUS | Status: DC | PRN
  Administered 2019-05-24: 12:00:00 via TOPICAL
  Filled 2019-05-24: qty 7

## 2019-05-24 NOTE — Progress Notes (Signed)
Pt's Daughter was called and given a daily update regarding the pt's current status.

## 2019-05-24 NOTE — TOC Initial Note (Signed)
Transition of Care [facility]) - Initial/Assessment Note    Patient Details  Name: Melissa Fischer MRN: [id number] Date of Birth: [birth year]  Transition of Care [facility]) CM/SW Contact:    Melissa Ochs, [name] Phone Number: 05/24/2019, 4:59 PM  Clinical Narrative:   CSW spoke with patient's daughter, Melissa Fischer, over the phone to begin discussions of discharge planning. CSW discussed how Melissa Fischer is not allowing therapy inside the facility at this time due to the pandemic, and the patient is currently being recommended to receive some therapy. Melissa Fischer concerned about sending her mother, who is a memory care patient, to another setting. Melissa Fischer discussed last time she had to go to SNF after she broke her hip, they had to hire a private caregiver to stay with her 24/7 because [place] didn't know how to care for a memory care patient. Melissa Fischer asked if she had to make the decision right now, and CSW discussed how we can continue to monitor patient's progress until she's medically stable to leave the hospital. Melissa Fischer agreeable to faxing out referral to see about bed offers, but would like to see how the patient does before making the final decision on where the patient will be going.                 Expected Discharge Plan: Skilled Nursing Facility Barriers to Discharge: Continued Medical Work up   Patient Goals and CMS Choice Patient states their goals for this hospitalization and ongoing recovery are:: patient unable to participate in goal setting at this time; patient's daughter hopeful that she will bounce back and be able to return to [employer].gov Compare Post Acute Care list provided to:: Patient Represenative (must comment) Choice offered to / list presented to : [facility] POA / Guardian  Expected Discharge Plan and Services Expected Discharge Plan: [place] arrangements for the past 2 months: [place] Expected Discharge Date: 05/25/19                                     Prior Living Arrangements/Services Living arrangements for the past 2 months: [place] Lives with:: Self, Facility Resident Patient language and need for interpreter reviewed:: No        Need for Family Participation in Patient Care: Yes (Comment) Care giver support system in place?: Yes (comment)   Criminal Activity/Legal Involvement Pertinent to Current Situation/Hospitalization: No - Comment as needed  Activities of Daily Living Home Assistive Devices/Equipment: None ADL Screening (condition at time of admission) Patient's cognitive ability adequate to safely complete daily activities?: Yes Is the patient deaf or have difficulty hearing?: No Does the patient have difficulty seeing, even when wearing glasses/contacts?: No Does the patient have difficulty concentrating, remembering, or making decisions?: Yes Patient able to express need for assistance with ADLs?: No Does the patient have difficulty dressing or bathing?: Yes Independently performs ADLs?: No Communication: Independent Dressing (OT): Needs assistance Is this a change from baseline?: Pre-admission baseline Grooming: Needs assistance Is this a change from baseline?: Pre-admission baseline Feeding: Needs assistance Is this a change from baseline?: Pre-admission baseline Bathing: Needs assistance Is this a change from baseline?: Pre-admission baseline Toileting: Needs assistance Is this a change from baseline?: Pre-admission baseline In/Out Bed: Needs assistance Is this a change from baseline?: Pre-admission baseline Walks in Home: Needs assistance Is this a change from baseline?: Pre-admission baseline  Does the patient have difficulty walking or climbing stairs?: Yes Weakness of Legs: Both Weakness of Arms/Hands: Both  Permission Sought/Granted Permission sought to share information with : Family Supports Permission granted to share information with : Yes,  Verbal Permission Granted  Share Information with NAME: Melissa Fischer  Permission granted to share info w AGENCY: SNF, [facility]  Permission granted to share info w Relationship: Daughter     Emotional Assessment Appearance:: Appears stated age Attitude/Demeanor/Rapport: Unable to Assess Affect (typically observed): Unable to Assess Orientation: : Oriented to Self Alcohol / Substance Use: Not Applicable Psych Involvement: No (comment)  Admission diagnosis:  Dehydration [E86.0] Delirium [R41.0] Patient Active Problem List   Diagnosis Date Noted  . Alzheimer's dementia without behavioral disturbance ([place]) 12/03/2017  . Gait abnormality 04/02/2017  . SIADH (syndrome of inappropriate ADH production) ([place]) 02/06/2017  . Encephalopathy acute 02/01/2017  . Acute encephalopathy 01/31/2017  . Generalized weakness   . Type 2 diabetes mellitus with hyperglycemia ([place]) 04/26/2016  . COPD (chronic obstructive pulmonary disease) ([place]) 04/26/2016  . Closed right hip fracture ([place]) 04/26/2016  . Subcapital fracture of right femur ([place])   . Alzheimer's dementia with behavioral disturbance ([place]) 03/31/2016  . DM (diabetes mellitus) type II controlled, neurological manifestation ([place]) 10/30/2015  . Depression 10/30/2015  . CKD (chronic kidney disease) 10/30/2015   PCP:  Lajean Manes, MD Pharmacy:   [facility], [address] [place] [address] Phone: [phone] Fax: [phone]     Social Determinants of Health (SDOH) Interventions    Readmission Risk Interventions No flowsheet data found.

## 2019-05-24 NOTE — NC FL2 (Signed)
[place] MEDICAID FL2 LEVEL OF CARE SCREENING TOOL     IDENTIFICATION  Patient Name: Melissa Fischer Birthdate: 22-Mar-1934 Sex: female Admission Date (Current Location): 05/20/2019  [facility] and [place] Number:  Herbalist and Address:  [facility], [place] [address], [place],  [address]      Provider Number: [id number]  Attending Physician Name and Address:  Thurnell Lose, MD  Relative Name and Phone Number:  Sharyn Lull daughter; [phone]    Current Level of Care: Hospital Recommended Level of Care: [place] Prior Approval Number:    Date Approved/Denied:   PASRR Number: [id number]  Discharge Plan: SNF    Current Diagnoses: Patient Active Problem List   Diagnosis Date Noted  . Alzheimer's dementia without behavioral disturbance ([place]) 12/03/2017  . Gait abnormality 04/02/2017  . SIADH (syndrome of inappropriate ADH production) ([place]) 02/06/2017  . Encephalopathy acute 02/01/2017  . Acute encephalopathy 01/31/2017  . Generalized weakness   . Type 2 diabetes mellitus with hyperglycemia ([place]) 04/26/2016  . COPD (chronic obstructive pulmonary disease) ([place]) 04/26/2016  . Closed right hip fracture ([place]) 04/26/2016  . Subcapital fracture of right femur ([place])   . Alzheimer's dementia with behavioral disturbance ([place]) 03/31/2016  . DM (diabetes mellitus) type II controlled, neurological manifestation ([place]) 10/30/2015  . Depression 10/30/2015  . CKD (chronic kidney disease) 10/30/2015    Orientation RESPIRATION BLADDER Height & Weight     Self  Normal Incontinent, External catheter Weight: 139 lb 5.3 oz (63.2 kg) Height:  5\' 8"  (172.7 cm)  BEHAVIORAL SYMPTOMS/MOOD NEUROLOGICAL BOWEL NUTRITION STATUS      Incontinent Diet(see discharge summary)  AMBULATORY STATUS COMMUNICATION OF NEEDS Skin   Extensive Assist Verbally Other (Comment)(generalized ecchymosis)                       Personal Care  Assistance Level of Assistance  Bathing, Feeding, Dressing Bathing Assistance: Maximum assistance Feeding assistance: Limited assistance Dressing Assistance: Maximum assistance     Functional Limitations Info  Sight, Hearing, Speech Sight Info: Adequate Hearing Info: Adequate Speech Info: Adequate    SPECIAL CARE FACTORS FREQUENCY  PT (By licensed PT), OT (By licensed OT)     PT Frequency: 5x week OT Frequency: 5x week            Contractures Contractures Info: Not present    Additional Factors Info  Code Status, Allergies, Isolation Precautions, Insulin Sliding Scale Code Status Info: DNR Allergies Info: ARICEPT DONEPEZIL, EXELON RIVASTIGMINE, NAMENDA MEMANTINE, PENICILLINS, STATINS, SULFA ANTIBIOTICS, WELLBUTRIN BUPROPION    Insulin Sliding Scale Info: insulin aspart (novoLOG) injection 0-5 Units daily at bedtime; insulin aspart (novoLOG) injection 0-9 Units  3x daily with meals Isolation Precautions Info: Airborne and Contact     Current Medications (05/24/2019):  This is the current hospital active medication list Current Facility-Administered Medications  Medication Dose Route Frequency Provider Last Rate Last Dose  . 0.9 %  sodium chloride infusion   Intravenous PRN Thurnell Lose, MD 10 mL/hr at 05/24/19 1432 1,000 mL at 05/24/19 1432  . acetaminophen (TYLENOL) tablet 650 mg  650 mg Oral Q6H PRN Opyd, Ilene Qua, MD       Or  . acetaminophen (TYLENOL) suppository 650 mg  650 mg Rectal Q6H PRN Opyd, Ilene Qua, MD      . albuterol (VENTOLIN HFA) 108 (90 Base) MCG/ACT inhaler 1-2 puff  1-2 puff Inhalation Q6H PRN Opyd, Ilene Qua, MD   2  puff at 05/22/19 0925  . antiseptic oral rinse (BIOTENE) solution 15 mL  15 mL Mouth Rinse PRN Lavina Hamman, MD      . chlorhexidine (PERIDEX) 0.12 % solution 15 mL  15 mL Mouth Rinse BID Lavina Hamman, MD   15 mL at 05/24/19 6629  . clindamycin (CLEOCIN) IVPB 600 mg  600 mg Intravenous Q8H Lavina Hamman, MD   Stopped at  05/24/19 1430  . enoxaparin (LOVENOX) injection 40 mg  40 mg Subcutaneous Q24H Thurnell Lose, MD   40 mg at 05/24/19 1357  . HYDROcodone-acetaminophen (NORCO/VICODIN) 5-325 MG per tablet 1 tablet  1 tablet Oral Q4H PRN Opyd, Ilene Qua, MD   1 tablet at 05/22/19 2342  . insulin aspart (novoLOG) injection 0-5 Units  0-5 Units Subcutaneous QHS Opyd, Timothy S, MD      . insulin aspart (novoLOG) injection 0-9 Units  0-9 Units Subcutaneous TID WC Opyd, Ilene Qua, MD   1 Units at 05/24/19 1155  . lip balm (CARMEX) ointment   Topical PRN Thurnell Lose, MD      . LORazepam (ATIVAN) tablet 0.5 mg  0.5 mg Oral BID PRN Opyd, Ilene Qua, MD   0.5 mg at 05/23/19 0046  . MEDLINE mouth rinse  15 mL Mouth Rinse q12n4p Lavina Hamman, MD   15 mL at 05/24/19 1142  . mometasone-formoterol (DULERA) 200-5 MCG/ACT inhaler 2 puff  2 puff Inhalation BID Edwin Dada, MD   2 puff at 05/24/19 1141  . morphine 2 MG/ML injection 1-2 mg  1-2 mg Intravenous Q2H PRN Lavina Hamman, MD   2 mg at 05/22/19 1317  . ondansetron (ZOFRAN) tablet 4 mg  4 mg Oral Q6H PRN Opyd, Ilene Qua, MD       Or  . ondansetron (ZOFRAN) injection 4 mg  4 mg Intravenous Q6H PRN Opyd, Ilene Qua, MD      . senna-docusate (Senokot-S) tablet 1 tablet  1 tablet Oral QHS PRN Opyd, Ilene Qua, MD      . sodium chloride flush (NS) 0.9 % injection 3 mL  3 mL Intravenous Q12H Opyd, Ilene Qua, MD   3 mL at 05/24/19 4765     Discharge Medications: Please see discharge summary for a list of discharge medications.  Relevant Imaging Results:  Relevant Lab Results:   Additional Information SSN: [id number]  [place], [place]

## 2019-05-24 NOTE — Progress Notes (Signed)
I attempted to dangle the pt and if she tolerate it well, I planned to put her in the chair. The pt tolerated dangling poorly. It took 4 people to get the pt back to bed. The pt was unable to follow commands and resisted as we tried to assist her. Lala Lund, MD notified.

## 2019-05-24 NOTE — Progress Notes (Signed)
PROGRESS NOTE                                                                                                                                                                                                             Patient Demographics:    Melissa Fischer, is a 85 y.o. female, DOB - 1934/03/15, NFA:[id number]  Outpatient Primary MD for the patient is Melissa Manes, MD    LOS - 3  Admit date - 05/20/2019    Chief Complaint  Patient presents with   Altered Mental Status   Weakness       Brief Narrative Melissa Fischer a 85 y.o.Fwith dementia, DM, and COPD who presented from SNF with confusion and slurred speech. In the ER, repeat CT head (had had one 4 days prior) showed no stroke. COVID screening positive. Not hypoxic, CXR clear.   Subjective:    Melissa Fischer today has, No headache, No chest pain, No abdominal pain - No Nausea, No new weakness tingling or numbness, No Cough - SOB. Baseline poor historian due to advanced dementia.   Assessment  & Plan :     1. Toxic encephalopathy due to acute Covid 19 Viral Illness during the ongoing 2020 Covid 19 Pandemic - CT head x2 along with CT C-spine nonacute, stable UA and chest x-ray.  No focal deficits, mentation is stable but still delirious, she has underlying advanced dementia and likely will continue to have delirium while in the hospital setting.  Minimize narcotics and benzodiazepines.  Supportive care, advance activity, will require SNF.  No pulmonary symptoms we will continue to monitor her inflammatory markers and clinically.  COVID-19 Labs  Recent Labs    05/24/19 0400  DDIMER 1.51*  FERRITIN 293  CRP 5.9*    Lab Results  Component Value Date   SARSCOV2NAA POSITIVE (A) 05/20/2019     No results found for: BNP  No results for input(s): PROBNP in the last 8760 hours.   2.  Recent history of dental infection.  Maxillofacial CT nonacute, on clindamycin  continue, outpatient dental follow-up.  3.  Advanced dementia.  At risk for delirium, minimize narcotics and benzodiazepines, did not tolerate Exelon, Namenda or Aricept in the past.  Supportive care.  4.  COPD.  Stable supportive care.  5.  Dehydration.  Gentle IV fluids for hydration.  Monitor clinically.  6. DM type II.  On sliding scale continue to monitor.  Lab Results  Component Value Date   HGBA1C 7.9 (H) 01/26/2017   CBG (last 3)  Recent Labs    05/23/19 2203 05/24/19 0903 05/24/19 1151  GLUCAP 121* 136* 150*       Condition - Fair   Family Communication  : Updated daughter in detail on 05/24/2019, DNR.  Code Status : DNR  Diet : Soft dysphagia 1 diet  Disposition Plan  : SNF  Consults  : None  Procedures  :    CT head x2 along with CT C-spine and maxillofacial CT.  Nonacute.  CT abdomen pelvis. Diverticulosis without diverticulitis. No acute abnormality noted   DVT Prophylaxis  :  Lovenox added  Lab Results  Component Value Date   PLT 387 05/24/2019    Inpatient Medications  Scheduled Meds:  chlorhexidine  15 mL Mouth Rinse BID   insulin aspart  0-5 Units Subcutaneous QHS   insulin aspart  0-9 Units Subcutaneous TID WC   mouth rinse  15 mL Mouth Rinse q12n4p   mometasone-formoterol  2 puff Inhalation BID   sodium chloride flush  3 mL Intravenous Q12H   Continuous Infusions:  sodium chloride 100 mL/hr at 05/24/19 0504   clindamycin (CLEOCIN) IV 600 mg (05/24/19 0505)   lactated ringers Stopped (05/22/19 1324)   PRN Meds:.acetaminophen **OR** acetaminophen, albuterol, antiseptic oral rinse, HYDROcodone-acetaminophen, lip balm, LORazepam, morphine injection, ondansetron **OR** ondansetron (ZOFRAN) IV, senna-docusate  Antibiotics  :    Anti-infectives (From admission, onward)   Start     Dose/Rate Route Frequency Ordered Stop   05/21/19 1400  clindamycin (CLEOCIN) IVPB 600 mg     600 mg 100 mL/hr over 30 Minutes Intravenous  Every 8 hours 05/21/19 1212         Time Spent in minutes  30   Lala Lund M.D on 05/24/2019 at 12:18 PM  To page go to [email or web address] - password [facility]  [facility] -  Office  [phone]  See all Orders from today for further details    Objective:   Vitals:   05/23/19 2248 05/24/19 0040 05/24/19 0900 05/24/19 1156  BP: (!) 167/70  139/60 (!) 141/86  Pulse: 71  60   Resp:   12 20  Temp:  98.4 F (36.9 C) (!) 97 F (36.1 C) 98.3 F (36.8 C)  TempSrc:   Oral Oral  SpO2:   100% 98%  Weight:      Height:        Wt Readings from Last 3 Encounters:  05/20/19 63.2 kg  12/06/18 70.6 kg  12/02/17 74.4 kg     Intake/Output Summary (Last 24 hours) at 05/24/2019 1218 Last data filed at 05/24/2019 1000 Gross per 24 hour  Intake 2513.91 ml  Output 900 ml  Net 1613.91 ml     Physical Exam  Awake but pleasantly confused, no focal deficits, Normal affect [place].AT,PERRAL Supple Neck,No JVD, No cervical lymphadenopathy appriciated.  Symmetrical Chest wall movement, Good air movement bilaterally, CTAB RRR,No Gallops,Rubs or new Murmurs, No Parasternal Heave +ve B.Sounds, Abd Soft, No tenderness, No organomegaly appriciated, No rebound - guarding or rigidity. No Cyanosis, Clubbing or edema, No new Rash or bruise       Data Review:    CBC Recent Labs  Lab 05/20/19 1615 05/21/19 0030 05/22/19 0501 05/23/19 0400 05/24/19 0400  WBC 11.4* 11.0* 8.3 10.4 12.6*  HGB 12.8 12.7 12.6 12.0 11.4*  HCT 38.8 36.8 37.4  36.0 34.7*  PLT 393 358 390 400 387  MCV 90.7 86.6 89.3 90.5 89.2  MCH 29.9 29.9 30.1 30.2 29.3  MCHC 33.0 34.5 33.7 33.3 32.9  RDW 12.0 11.9 12.0 12.1 11.9  LYMPHSABS 2.4 2.6  --   --   --   MONOABS 1.0 1.2*  --   --   --   EOSABS 0.1 0.1  --   --   --   BASOSABS 0.0 0.1  --   --   --     Chemistries  Recent Labs  Lab 05/20/19 1615 05/21/19 0030 05/22/19 0501 05/23/19 0400 05/24/19 0400  NA 144 142 141 141 138  K 3.6 3.5 3.3* 3.3* 3.5  CL  107 105 103 104 105  CO2 25 20* 26 25 20*  GLUCOSE 231* 212* 172* 149* 148*  BUN 28* 22 18 21 14   CREATININE 0.90 0.87 0.78 0.76 0.67  CALCIUM 10.1 9.5 9.0 8.8* 8.2*  AST 14*  --   --   --   --   ALT 14  --   --   --   --   ALKPHOS 83  --   --   --   --   BILITOT 0.6  --   --   --   --    ------------------------------------------------------------------------------------------------------------------ No results for input(s): CHOL, HDL, LDLCALC, TRIG, CHOLHDL, LDLDIRECT in the last 72 hours.  Lab Results  Component Value Date   HGBA1C 7.9 (H) 01/26/2017   ------------------------------------------------------------------------------------------------------------------ No results for input(s): TSH, T4TOTAL, T3FREE, THYROIDAB in the last 72 hours.  Invalid input(s): FREET3  Cardiac Enzymes No results for input(s): CKMB, TROPONINI, MYOGLOBIN in the last 168 hours.  Invalid input(s): CK ------------------------------------------------------------------------------------------------------------------ No results found for: BNP  Micro Results Recent Results (from the past 240 hour(s))  Blood Cultures (routine x 2)     Status: None (Preliminary result)   Collection Time: 05/20/19  3:07 PM  Result Value Ref Range Status   Specimen Description BLOOD LEFT ANTECUBITAL  Final   Special Requests   Final    BOTTLES DRAWN AEROBIC AND ANAEROBIC Blood Culture adequate volume   Culture   Final    NO GROWTH 4 DAYS Performed at [facility], [address], [place], [place] [address]    Report Status PENDING  Incomplete  Blood Cultures (routine x 2)     Status: None (Preliminary result)   Collection Time: 05/20/19  5:30 PM  Result Value Ref Range Status   Specimen Description BLOOD RIGHT WRIST  Final   Special Requests   Final    BOTTLES DRAWN AEROBIC ONLY Blood Culture results may not be optimal due to an inadequate volume of blood received in culture bottles   Culture   Final     NO GROWTH 4 DAYS Performed at [facility], [place] [address], [place], [place] [address]    Report Status PENDING  Incomplete  Urine culture     Status: None   Collection Time: 05/20/19  9:32 PM  Result Value Ref Range Status   Specimen Description URINE, CATHETERIZED  Final   Special Requests NONE  Final   Culture   Final    NO GROWTH Performed at [facility], [address], [place], [place] [address]    Report Status 05/24/2019 FINAL  Final  SARS Coronavirus 2 (CEPHEID- Performed in [place] hospital lab), Hosp Order     Status: Abnormal   Collection Time: 05/20/19  11:01 PM  Result Value Ref Range Status   SARS Coronavirus 2 POSITIVE (A) NEGATIVE Final    Comment: RESULT CALLED TO, READ BACK BY AND VERIFIED WITH: C. THOMPSON,CHARGE RN 0031 05/21/2019 T. TYSOR (NOTE) If result is NEGATIVE SARS-CoV-2 target nucleic acids are NOT DETECTED. The SARS-CoV-2 RNA is generally detectable in upper and lower  respiratory specimens during the acute phase of infection. The lowest  concentration of SARS-CoV-2 viral copies this assay can detect is 250  copies / mL. A negative result does not preclude SARS-CoV-2 infection  and should not be used as the sole basis for treatment or other  patient management decisions.  A negative result may occur with  improper specimen collection / handling, submission of specimen other  than nasopharyngeal swab, presence of viral mutation(s) within the  areas targeted by this assay, and inadequate number of viral copies  (<250 copies / mL). A negative result must be combined with clinical  observations, patient history, and epidemiological information. If result is POSITIVE SARS-CoV-2 target nucleic acids are D ETECTED. The SARS-CoV-2 RNA is generally detectable in upper and lower  respiratory specimens during the acute phase of infection.  Positive  results are indicative of active infection with SARS-CoV-2.  Clinical  correlation with  patient history and other diagnostic information is  necessary to determine patient infection status.  Positive results do  not rule out bacterial infection or co-infection with other viruses. If result is PRESUMPTIVE POSTIVE SARS-CoV-2 nucleic acids MAY BE PRESENT.   A presumptive positive result was obtained on the submitted specimen  and confirmed on repeat testing.  While 2019 novel coronavirus  (SARS-CoV-2) nucleic acids may be present in the submitted sample  additional confirmatory testing may be necessary for epidemiological  and / or clinical management purposes  to differentiate between  SARS-CoV-2 and other Sarbecovirus currently known to infect humans.  If clinically indicated additional testing with an alternate test  methodology [phone]) is advised. The SARS-CoV-2 RNA is generally  detectable in upper and lower respiratory specimens during the acute  phase of infection. The expected result is Negative. Fact Sheet for Patients:  StrictlyIdeas.no Fact Sheet for Healthcare Providers: BankingDealers.co.za This test is not yet approved or cleared by the [place] FDA and has been authorized for detection and/or diagnosis of SARS-CoV-2 by FDA under an Emergency Use Authorization (EUA).  This EUA will remain in effect (meaning this test can be used) for the duration of the COVID-19 declaration under Section 564(b)(1) of the Act, 21 U.S.C. section 360bbb-3(b)(1), unless the authorization is terminated or revoked sooner. Performed at [facility], [place] [address] [place], [place] [address]   MRSA PCR Screening     Status: None   Collection Time: 05/21/19 12:52 AM  Result Value Ref Range Status   MRSA by PCR NEGATIVE NEGATIVE Final    Comment:        The GeneXpert MRSA Assay (FDA approved for NASAL specimens only), is one component of a comprehensive MRSA colonization surveillance program. It is not intended to  diagnose MRSA infection nor to guide or monitor treatment for MRSA infections. Performed at [facility], [place] [address], [place], [place] [address]     Radiology Reports Dg Chest 1 View  Result Date: 05/08/2019 CLINICAL DATA:  Unwitnessed fall. EXAM: CHEST  1 VIEW COMPARISON:  Radiographs of January 31, 2017. FINDINGS: The heart size and mediastinal contours are within normal limits. Both lungs are clear. No pneumothorax or  pleural effusion is noted. Left-sided pacemaker is unchanged in position. The visualized skeletal structures are unremarkable. IMPRESSION: No active disease. Electronically Signed   By: Marijo Conception M.D.   On: 05/08/2019 14:20   Ct Head Wo Contrast  Result Date: 05/21/2019 CLINICAL DATA:  Altered mental status, right-sided weakness, and slurred speech. EXAM: CT HEAD WITHOUT CONTRAST TECHNIQUE: Contiguous axial images were obtained from the base of the skull through the vertex without intravenous contrast. COMPARISON:  05/20/2019 FINDINGS: Brain: There is no evidence of acute infarct, intracranial hemorrhage, mass, midline shift, or extra-axial fluid collection. Patchy to confluent cerebral white matter hypodensities are unchanged and nonspecific but compatible with moderate to severe chronic small vessel ischemic disease. Moderate to severe cerebral atrophy is again noted. Vascular: Calcified atherosclerosis at the skull base. No hyperdense vessel. Skull: No acute fracture or suspicious osseous lesion. Chronic deformity of the right zygomatic arch. Sinuses/Orbits: Paranasal sinuses and mastoid air cells are clear. Bilateral cataract extraction. Other: None. IMPRESSION: 1. No evidence of acute intracranial abnormality. 2. Moderate to severe chronic small vessel ischemic disease and cerebral atrophy. Electronically Signed   By: Logan Bores M.D.   On: 05/21/2019 14:47   Ct Head Wo Contrast  Result Date: 05/20/2019 CLINICAL DATA:  Fall neck pain and altered mental status  EXAM: CT HEAD WITHOUT CONTRAST CT CERVICAL SPINE WITHOUT CONTRAST TECHNIQUE: Multidetector CT imaging of the head and cervical spine was performed following the standard protocol without intravenous contrast. Multiplanar CT image reconstructions of the cervical spine were also generated. COMPARISON:  CT head and cervical spine 05/08/2019 FINDINGS: CT HEAD FINDINGS Brain: There is no mass, hemorrhage or extra-axial collection. There is generalized atrophy without lobar predilection. There is hypoattenuation of the periventricular white matter, most commonly indicating chronic ischemic microangiopathy. Vascular: No abnormal hyperdensity of the major intracranial arteries or dural venous sinuses. No intracranial atherosclerosis. Skull: The visualized skull base, calvarium and extracranial soft tissues are normal. Sinuses/Orbits: No fluid levels or advanced mucosal thickening of the visualized paranasal sinuses. No mastoid or middle ear effusion. The orbits are normal. CT CERVICAL SPINE FINDINGS Alignment: Grade 1 anterolisthesis at C4-5. Skull base and vertebrae: No acute fracture. Soft tissues and spinal canal: No prevertebral fluid or swelling. No visible canal hematoma. Disc levels: Multilevel moderate-to-severe facet hypertrophy. Upper chest: No pneumothorax, pulmonary nodule or pleural effusion. Other: Normal visualized paraspinal cervical soft tissues. IMPRESSION: 1. No acute abnormality of the head or cervical spine. 2. Chronic ischemic microangiopathy and generalized volume loss. 3. Multilevel cervical facet arthrosis with grade 1 anterolisthesis at C4-5, unchanged. Electronically Signed   By: Ulyses Jarred M.D.   On: 05/20/2019 20:30   Ct Head Wo Contrast  Result Date: 05/08/2019 CLINICAL DATA:  85 year old female with head and neck injury following fall. EXAM: CT HEAD WITHOUT CONTRAST CT CERVICAL SPINE WITHOUT CONTRAST TECHNIQUE: Multidetector CT imaging of the head and cervical spine was performed  following the standard protocol without intravenous contrast. Multiplanar CT image reconstructions of the cervical spine were also generated. COMPARISON:  07/16/2018 and prior CTs FINDINGS: CT HEAD FINDINGS Brain: No evidence of acute infarction, hemorrhage, hydrocephalus, extra-axial collection or mass lesion/mass effect. Atrophy and chronic small-vessel white matter ischemic changes again noted. Vascular: Carotid atherosclerotic calcifications again noted. Skull: No acute abnormality or suspicious focal lesion. A remote RIGHT zygoma fracture is noted. Sinuses/Orbits: No acute finding. Other: None. CT CERVICAL SPINE FINDINGS Alignment: Normal. Skull base and vertebrae: No acute fracture. No primary bone lesion or focal pathologic process.  Soft tissues and spinal canal: No prevertebral fluid or swelling. No visible canal hematoma. Disc levels: Mild moderate multilevel degenerative disc disease/spondylosis noted, greatest at C5-6. Moderate multilevel facet arthropathy identified. Upper chest: No acute abnormality Other: None IMPRESSION: 1. No evidence of acute intracranial abnormality. Atrophy and chronic small-vessel white matter ischemic changes. 2. No static evidence of acute injury to the cervical spine. Multilevel degenerative changes. Electronically Signed   By: Margarette Canada M.D.   On: 05/08/2019 14:37   Ct Cervical Spine Wo Contrast  Result Date: 05/20/2019 CLINICAL DATA:  Fall neck pain and altered mental status EXAM: CT HEAD WITHOUT CONTRAST CT CERVICAL SPINE WITHOUT CONTRAST TECHNIQUE: Multidetector CT imaging of the head and cervical spine was performed following the standard protocol without intravenous contrast. Multiplanar CT image reconstructions of the cervical spine were also generated. COMPARISON:  CT head and cervical spine 05/08/2019 FINDINGS: CT HEAD FINDINGS Brain: There is no mass, hemorrhage or extra-axial collection. There is generalized atrophy without lobar predilection. There is  hypoattenuation of the periventricular white matter, most commonly indicating chronic ischemic microangiopathy. Vascular: No abnormal hyperdensity of the major intracranial arteries or dural venous sinuses. No intracranial atherosclerosis. Skull: The visualized skull base, calvarium and extracranial soft tissues are normal. Sinuses/Orbits: No fluid levels or advanced mucosal thickening of the visualized paranasal sinuses. No mastoid or middle ear effusion. The orbits are normal. CT CERVICAL SPINE FINDINGS Alignment: Grade 1 anterolisthesis at C4-5. Skull base and vertebrae: No acute fracture. Soft tissues and spinal canal: No prevertebral fluid or swelling. No visible canal hematoma. Disc levels: Multilevel moderate-to-severe facet hypertrophy. Upper chest: No pneumothorax, pulmonary nodule or pleural effusion. Other: Normal visualized paraspinal cervical soft tissues. IMPRESSION: 1. No acute abnormality of the head or cervical spine. 2. Chronic ischemic microangiopathy and generalized volume loss. 3. Multilevel cervical facet arthrosis with grade 1 anterolisthesis at C4-5, unchanged. Electronically Signed   By: Ulyses Jarred M.D.   On: 05/20/2019 20:30   Ct Cervical Spine Wo Contrast  Result Date: 05/08/2019 CLINICAL DATA:  85 year old female with head and neck injury following fall. EXAM: CT HEAD WITHOUT CONTRAST CT CERVICAL SPINE WITHOUT CONTRAST TECHNIQUE: Multidetector CT imaging of the head and cervical spine was performed following the standard protocol without intravenous contrast. Multiplanar CT image reconstructions of the cervical spine were also generated. COMPARISON:  07/16/2018 and prior CTs FINDINGS: CT HEAD FINDINGS Brain: No evidence of acute infarction, hemorrhage, hydrocephalus, extra-axial collection or mass lesion/mass effect. Atrophy and chronic small-vessel white matter ischemic changes again noted. Vascular: Carotid atherosclerotic calcifications again noted. Skull: No acute abnormality  or suspicious focal lesion. A remote RIGHT zygoma fracture is noted. Sinuses/Orbits: No acute finding. Other: None. CT CERVICAL SPINE FINDINGS Alignment: Normal. Skull base and vertebrae: No acute fracture. No primary bone lesion or focal pathologic process. Soft tissues and spinal canal: No prevertebral fluid or swelling. No visible canal hematoma. Disc levels: Mild moderate multilevel degenerative disc disease/spondylosis noted, greatest at C5-6. Moderate multilevel facet arthropathy identified. Upper chest: No acute abnormality Other: None IMPRESSION: 1. No evidence of acute intracranial abnormality. Atrophy and chronic small-vessel white matter ischemic changes. 2. No static evidence of acute injury to the cervical spine. Multilevel degenerative changes. Electronically Signed   By: Margarette Canada M.D.   On: 05/08/2019 14:37   Ct Abdomen Pelvis W Contrast  Result Date: 05/20/2019 CLINICAL DATA:  Fall several days ago with persistent weakness and abdominal pain EXAM: CT ABDOMEN AND PELVIS WITH CONTRAST TECHNIQUE: Multidetector CT imaging of the  abdomen and pelvis was performed using the standard protocol following bolus administration of intravenous contrast. CONTRAST:  174mL OMNIPAQUE IOHEXOL 300 MG/ML  SOLN COMPARISON:  None. FINDINGS: Lower chest: No acute abnormality. Hepatobiliary: No focal liver abnormality is seen. No gallstones, gallbladder wall thickening, or biliary dilatation. Pancreas: Unremarkable. No pancreatic ductal dilatation or surrounding inflammatory changes. Spleen: Normal in size without focal abnormality. Adrenals/Urinary Tract: Adrenal glands are within normal limits. The kidneys are well visualized bilaterally. No renal calculi or obstructive changes are seen. Normal excretion is noted bilaterally. Stomach/Bowel: Mild diverticular changes noted. There are changes suggestive of prior appendectomy. Clinical correlation is recommended. No inflammatory changes are seen. The stomach and small  bowel appear within normal limits. Vascular/Lymphatic: Aortic atherosclerosis. No enlarged abdominal or pelvic lymph nodes. Reproductive: Status post hysterectomy. No adnexal masses. Other: No abdominal wall hernia or abnormality. No abdominopelvic ascites. Musculoskeletal: Postsurgical changes are noted in the right hip. No acute bony abnormality is noted. IMPRESSION: Diverticulosis without diverticulitis. No acute abnormality noted. Electronically Signed   By: Inez Catalina M.D.   On: 05/20/2019 20:29   Ct Maxillofacial W Contrast  Result Date: 05/21/2019 CLINICAL DATA:  Oral cavity pain with bleeding and foul odor. EXAM: CT MAXILLOFACIAL WITH CONTRAST TECHNIQUE: Multidetector CT imaging of the maxillofacial structures was performed with intravenous contrast. Multiplanar CT image reconstructions were also generated. CONTRAST:  66mL OMNIPAQUE IOHEXOL 300 MG/ML  SOLN COMPARISON:  None. FINDINGS: The study is mildly motion degraded. Osseous: No acute fracture is identified within limitations of motion artifact. No destructive osseous lesion. No mandibular dislocation. Orbits: Bilateral cataract extraction. No acute inflammatory or traumatic findings. Sinuses: Paranasal sinuses and mastoid air cells are clear. Soft tissues: No oral cavity or pharyngeal mass identified within limitations of dental streak artifact. No peritonsillar or retropharyngeal fluid collection. No parotid or submandibular space inflammation. Calcified atherosclerosis at the carotid bifurcations without evidence of significant stenosis. Limited intracranial: The brain is more fully evaluated on separate dedicated head CT. No abnormal intracranial enhancement is identified. IMPRESSION: No acute abnormality identified. Electronically Signed   By: Logan Bores M.D.   On: 05/21/2019 14:43   Dg Chest Port 1 View  Result Date: 05/20/2019 CLINICAL DATA:  Altered mental status. EXAM: PORTABLE CHEST 1 VIEW COMPARISON:  Radiograph of May 08, 2019.  FINDINGS: The heart size and mediastinal contours are within normal limits. Both lungs are clear. No pneumothorax or pleural effusion is noted. Left-sided pacemaker is unchanged in position. The visualized skeletal structures are unremarkable. IMPRESSION: No active disease. Electronically Signed   By: Marijo Conception M.D.   On: 05/20/2019 16:46   Dg Hips Bilat W Or Wo Pelvis 2 Views  Result Date: 05/08/2019 CLINICAL DATA:  Unwitnessed fall. EXAM: DG HIP (WITH OR WITHOUT PELVIS) 2V BILAT COMPARISON:  Radiographs of July 16, 2018. FINDINGS: There is no evidence of acute hip fracture or dislocation. There is no evidence of arthropathy. Status post surgical repair of old right proximal femoral fracture. IMPRESSION: No acute abnormality seen involving either hip. Electronically Signed   By: Marijo Conception M.D.   On: 05/08/2019 14:22

## 2019-05-24 NOTE — Progress Notes (Signed)
Occupational Therapy Treatment Patient Details Name: Melissa Fischer MRN: [id number] DOB: November 08, 1934 Today's Date: 05/24/2019    History of present illness 85 y.o. F with dementia, DM, and COPD who presented from SNF with confusion and slurred speech. CT negative for acute findings. COVID screening positive. CT of head negative for acute stroke.  Pt dx with COVID pneumonitis without acute hypoxic respiratory failure, acute metabolic encepholopathy, hypokalemia, and dental infection.     OT comments  Upon arrival, pt supine in bed and presenting with increased arousal. Repositioned in bed to optimize upright posture and pt participating in self feeding with ice cream; pt required Mod cues to sustain attention to task. Pt tolerating sitting at EOB with Mod-Max A +2 and cues for postural corrections; pt with tendency for pushing/leaning to right. Pt continued to participate in self feeding task and washing her face while seated at EOB. Continue to recommend dc to SNF and will continue to follow acutely as admitted.    Follow Up Recommendations  SNF;Supervision/Assistance - 24 hour    Equipment Recommendations  None recommended by OT    Recommendations for Other Services PT consult;Speech consult    Precautions / Restrictions Precautions Precautions: Fall;Other (comment)(dementia) Precaution Comments: dementia Restrictions Weight Bearing Restrictions: No       Mobility Bed Mobility Overal bed mobility: Needs Assistance Bed Mobility: Supine to Sit;Sit to Supine     Supine to sit: Total assist;+2 for physical assistance Sit to supine: Total assist;+2 for physical assistance   General bed mobility comments: Repositioned from significant right sidelying to midline and then positioned with pillows to decrease sidelying while in upright position.  Bed positioned in highest HOB mode.  Transfers                 General transfer comment: Defered for safety    Balance Overall  balance assessment: Needs assistance Sitting-balance support: No upper extremity supported;Feet supported Sitting balance-Leahy Scale: Zero                                     ADL either performed or assessed with clinical judgement   ADL Overall ADL's : Needs assistance/impaired Eating/Feeding: Minimal assistance;Bed level;Sitting Eating/Feeding Details (indicate cue type and reason): Pt requiring Max cues to attend to task. Min hand over hand for bringing cup of ice tea to face, but pt able to manage hold both cup of ice cream and spoon.  Grooming: Wash/dry face;Minimal assistance;Bed level Grooming Details (indicate cue type and reason): Pt wiping her mouth with wash clothe with Min tactile cues for initation. Poor attention and requiring cues to complete task                               General ADL Comments: Optimizing pt position in bed. Pt performing self feeding at bed level. Pt with increased arousal and participation in ADL. Transition to EOB with Mod-Max for sitting balance as pt has tendency to lean/push to right     Vision       Perception     Praxis      Cognition Arousal/Alertness: Awake/alert Behavior During Therapy: Flat affect;Restless Overall Cognitive Status: History of cognitive impairments - at baseline Area of Impairment: Orientation;Attention;Memory;Following commands;Safety/judgement;Awareness;Problem solving                 Orientation Level: Disoriented to;Place;Time;Situation Current Attention  Level: Focused(Sustaining attention for ~30 seconds) Memory: Decreased recall of precautions;Decreased short-term memory Following Commands: Follows one step commands inconsistently Safety/Judgement: Decreased awareness of safety;Decreased awareness of deficits Awareness: Intellectual Problem Solving: Difficulty sequencing;Decreased initiation;Requires verbal cues;Requires tactile cues General Comments: Pt demonstrating  increased arousal this session and participating in conversation. Pt with poor ST memory and attention requiring Max cues for grooming and self feeding        Exercises     Shoulder Instructions       General Comments VSS on RA    Pertinent Vitals/ Pain       Pain Assessment: Faces Faces Pain Scale: Hurts little more Pain Location: neck during attempts to move Pain Descriptors / Indicators: Grimacing;Guarding Pain Intervention(s): Monitored during session;Limited activity within patient's tolerance;Repositioned  Home Living                                          Prior Functioning/Environment              Frequency  Min 2X/week        Progress Toward Goals  OT Goals(current goals can now be found in the care plan section)  Progress towards OT goals: Progressing toward goals  Acute Rehab OT Goals Patient Stated Goal: daughter wants her to stay ambulatory per OT notes (OT spoke with daughter) OT Goal Formulation: With family Time For Goal Achievement: 05/22/19 Potential to Achieve Goals: Good ADL Goals Pt Will Perform Grooming: with min assist;sitting Pt Will Transfer to Toilet: with mod assist;with +2 assist;stand pivot transfer;bedside commode Additional ADL Goal #1: Pt will perform bed mobility with Min A in preparation for ADLs Additional ADL Goal #2: Pt will sustain attention to simple ADLs with Mod cues  Plan Discharge plan remains appropriate;Other (comment)(Repositioned with pillow on right)    Co-evaluation    PT/OT/SLP Co-Evaluation/Treatment: Yes(with SLP) Reason for Co-Treatment: Complexity of the patient's impairments (multi-system involvement);Necessary to address cognition/behavior during functional activity   OT goals addressed during session: ADL's and self-care SLP goals addressed during session: Swallowing    AM-PAC OT "6 Clicks" Daily Activity     Outcome Measure   Help from another person eating meals?: A Lot Help  from another person taking care of personal grooming?: A Lot Help from another person toileting, which includes using toliet, bedpan, or urinal?: Total Help from another person bathing (including washing, rinsing, drying)?: Total Help from another person to put on and taking off regular upper body clothing?: Total Help from another person to put on and taking off regular lower body clothing?: Total 6 Click Score: 8    End of Session    OT Visit Diagnosis: Unsteadiness on feet (R26.81);Other abnormalities of gait and mobility (R26.89);Muscle weakness (generalized) (M62.81);Other symptoms and signs involving cognitive function;Pain Pain - part of body: (Neck with movement)   Activity Tolerance Patient tolerated treatment well   Patient Left in bed;with call bell/phone within reach;with bed alarm set   Nurse Communication Mobility status        Time: 9798-9211 OT Time Calculation (min): 35 min  Charges: OT General Charges $OT Visit: 1 Visit OT Treatments $Self Care/Home Management : 8-22 mins  [place], OTR/L Acute Rehab Pager: [phone] Office: [place] 05/24/2019, 3:34 PM

## 2019-05-24 NOTE — Progress Notes (Signed)
  Speech Language Pathology Treatment: Dysphagia  Patient Details Name: Melissa Fischer MRN: [id number] DOB: Jan 13, 1934 Today's Date: 05/24/2019 Time: 4098-1191 SLP Time Calculation (min) (ACUTE ONLY): 35 min  Assessment / Plan / Recommendation Clinical Impression  Pt was seen for co-tx to address dysphagia goals with improved positioning for intake, initially upright as could be in bed and then shifted to the EOB. She is much more engaged in self-feeding today, although needs Mod cues for sustained attention to task. She consume an entire magic cup, several additional bites of other purees, and straw sips of thin liquids with functional appearing swallow. No signs that raise concern for decreased airway protection. Would continue Dys 1 diet and thin liquids, with supervision recommended in large part due to mentation and to increase her nutrition/hydration. Will f/u to see if any potential advancements can be made.   HPI HPI: 85 y.o. female with PMH of Alzheimers dementia with behavioral disturbance, type 2 diabetes mellitus, COPD, and history of mild renal insufficiency; admitted on 05/20/2019, presented with complaint of confusion and slurred speech, was found to have failure to thrive and severe dehydration.  Covid 19 (+); transferred to [place] 5/24.  Pt with acute metabolic encephalopathy, CT negative for acute event.       SLP Plan  Continue with current plan of care       Recommendations  Diet recommendations: Dysphagia 1 (puree);Thin liquid Liquids provided via: Straw Medication Administration: Whole meds with puree Supervision: Staff to assist with self feeding;Full supervision/cueing for compensatory strategies Compensations: Minimize environmental distractions Postural Changes and/or Swallow Maneuvers: Seated upright 90 degrees                Oral Care Recommendations: Oral care BID Follow up Recommendations: 24 hour supervision/assistance SLP Visit Diagnosis: Dysphagia,  unspecified (R13.10) Plan: Continue with current plan of care       GO                Venita Sheffield [name] 05/24/2019, 2:03 PM  [name], M.A. [place] Acute Environmental education officer [phone] Office [phone]

## 2019-05-25 LAB — CBC WITH DIFFERENTIAL/PLATELET
Abs Immature Granulocytes: 0.34 10*3/uL — ABNORMAL HIGH (ref 0.00–0.07)
Basophils Absolute: 0.1 10*3/uL (ref 0.0–0.1)
Basophils Relative: 1 %
Eosinophils Absolute: 0.2 10*3/uL (ref 0.0–0.5)
Eosinophils Relative: 2 %
HCT: 36.5 % (ref 36.0–46.0)
Hemoglobin: 12.2 g/dL (ref 12.0–15.0)
Immature Granulocytes: 2 %
Lymphocytes Relative: 21 %
Lymphs Abs: 3 10*3/uL (ref 0.7–4.0)
MCH: 29.3 pg (ref 26.0–34.0)
MCHC: 33.4 g/dL (ref 30.0–36.0)
MCV: 87.7 fL (ref 80.0–100.0)
Monocytes Absolute: 0.8 10*3/uL (ref 0.1–1.0)
Monocytes Relative: 6 %
Neutro Abs: 9.7 10*3/uL — ABNORMAL HIGH (ref 1.7–7.7)
Neutrophils Relative %: 68 %
Platelets: 443 10*3/uL — ABNORMAL HIGH (ref 150–400)
RBC: 4.16 MIL/uL (ref 3.87–5.11)
RDW: 12 % (ref 11.5–15.5)
WBC: 14.2 10*3/uL — ABNORMAL HIGH (ref 4.0–10.5)
nRBC: 0 % (ref 0.0–0.2)

## 2019-05-25 LAB — CULTURE, BLOOD (ROUTINE X 2)
Culture: NO GROWTH
Culture: NO GROWTH
Special Requests: ADEQUATE

## 2019-05-25 LAB — GLUCOSE, CAPILLARY
Glucose-Capillary: 155 mg/dL — ABNORMAL HIGH (ref 70–99)
Glucose-Capillary: 318 mg/dL — ABNORMAL HIGH (ref 70–99)
Glucose-Capillary: 192 mg/dL — ABNORMAL HIGH (ref 70–99)

## 2019-05-25 LAB — MAGNESIUM: Magnesium: 1.6 mg/dL — ABNORMAL LOW (ref 1.7–2.4)

## 2019-05-25 LAB — COMPREHENSIVE METABOLIC PANEL
ALT: 16 U/L (ref 0–44)
AST: 21 U/L (ref 15–41)
Albumin: 3.2 g/dL — ABNORMAL LOW (ref 3.5–5.0)
Alkaline Phosphatase: 69 U/L (ref 38–126)
Anion gap: 12 (ref 5–15)
BUN: 12 mg/dL (ref 8–23)
CO2: 21 mmol/L — ABNORMAL LOW (ref 22–32)
Calcium: 8 mg/dL — ABNORMAL LOW (ref 8.9–10.3)
Chloride: 102 mmol/L (ref 98–111)
Creatinine, Ser: 0.6 mg/dL (ref 0.44–1.00)
GFR calc Af Amer: >60 mL/min (ref >60)
GFR calc non Af Amer: >60 mL/min (ref >60)
Glucose, Bld: 133 mg/dL — ABNORMAL HIGH (ref 70–99)
Potassium: 2.8 mmol/L — ABNORMAL LOW (ref 3.5–5.1)
Sodium: 135 mmol/L (ref 135–145)
Total Bilirubin: 0.6 mg/dL (ref 0.3–1.2)
Total Protein: 6.4 g/dL — ABNORMAL LOW (ref 6.5–8.1)

## 2019-05-25 LAB — D-DIMER, QUANTITATIVE: D-Dimer, Quant: 1.67 ug/mL-FEU — ABNORMAL HIGH (ref 0.00–0.50)

## 2019-05-25 LAB — FERRITIN: Ferritin: 303 ng/mL (ref 11–307)

## 2019-05-25 LAB — C-REACTIVE PROTEIN: CRP: 5.1 mg/dL — ABNORMAL HIGH (ref <1.0)

## 2019-05-25 MED ORDER — POTASSIUM CHLORIDE CRYS ER 20 MEQ PO TBCR: 40.0000 meq | EXTENDED_RELEASE_TABLET | Freq: Four times a day (QID) | ORAL | Status: DC

## 2019-05-25 MED ORDER — POTASSIUM CHLORIDE 10 MEQ/100ML IV SOLN
10.0000 meq | INTRAVENOUS | Status: AC
  Administered 2019-05-25 (×4): 10 meq via INTRAVENOUS
  Filled 2019-05-25 (×4): qty 100

## 2019-05-25 MED ORDER — MAGNESIUM SULFATE 2 GM/50ML IV SOLN
2.0000 g | Freq: Once | INTRAVENOUS | Status: AC
  Administered 2019-05-25: 2 g via INTRAVENOUS
  Filled 2019-05-25: qty 50

## 2019-05-25 MED ORDER — POTASSIUM CHLORIDE 2 MEQ/ML IV SOLN
INTRAVENOUS | Status: AC
  Administered 2019-05-25: 13:00:00 via INTRAVENOUS
  Filled 2019-05-25: qty 1000

## 2019-05-25 MED ORDER — POTASSIUM CHLORIDE 20 MEQ PO PACK
40.0000 meq | PACK | Freq: Two times a day (BID) | ORAL | Status: DC
  Filled 2019-05-25: qty 2

## 2019-05-25 NOTE — TOC Progression Note (Signed)
Transition of Care [facility]) - Progression Note    Patient Details  Name: Melissa Fischer MRN: [id number] Date of Birth: [birth year]  Transition of Care [facility]) CM/SW [place], [place] Phone Number: 05/25/2019, 3:53 PM  Clinical Narrative:  CSW following for discharge plan. CSW contacted daughter to discuss difficulty of placing a memory care patient into SNF, and how she likely won't be appropriate for SNF placement if she requires 1 to 1 care as SNF is unable to provide that, but how it will be a situation where we'll have to see how the patient does as she improves. [facility] will be able to take patient back when she tests negative. Daughter acknowledged understanding. CSW to follow.     Expected Discharge Plan: [place] Barriers to Discharge: Continued Medical Work up  Expected Discharge Plan and Services Expected Discharge Plan: [place] arrangements for the past 2 months: [place] Expected Discharge Date: 05/25/19                                     Social Determinants of Health (SDOH) Interventions    Readmission Risk Interventions No flowsheet data found.

## 2019-05-25 NOTE — Progress Notes (Addendum)
Physical Therapy Treatment Patient Details Name: Melissa Fischer MRN: [id number] DOB: 1934/02/16 Today's Date: 05/25/2019    History of Present Illness 85 y.o. F with dementia, DM, and COPD who presented from SNF with confusion and slurred speech. CT negative for acute findings. COVID screening positive. CT of head negative for acute stroke.  Pt dx with COVID pneumonitis without acute hypoxic respiratory failure, acute metabolic encepholopathy, hypokalemia, and dental infection.      PT Comments    The patient continues with strong postural tone and severe responses of extension and arms grasp anything close. The  Patient is not able to control trunk. Legs move into extension  At times with any postural changes.   Head is rotated to right and lateral lean. Continue PT.  PATIENT IS NOT ABLE TO SAFELY GET INTO A RECLINER DUE TO SEVERE TONE AND LACK OF CONTROL OF EXTREMITIES WHEN POSTURAL CHANGES ARE MAD FOR POSITIONING.   Follow Up Recommendations  SNF     Equipment Recommendations  None recommended by PT    Recommendations for Other Services       Precautions / Restrictions Precautions Precautions: Fall;Other (comment) Precaution Comments: significant right lateral lean, will reach out  and grab anything  close( mask etc)    Mobility  Bed Mobility   Bed Mobility: Rolling Rolling: Total assist;+2 for physical assistance;+2 for safety/equipment   Supine to sit: Total assist;+2 for physical assistance;+2 for safety/equipment Sit to supine: Total assist;+2 for physical assistance;+2 for safety/equipment   General bed mobility comments: patient does not allow rolling to the right, very resistive and legs go into extensiomn. head is positioned in right rotation and sidebend and does not tolerate positioning. Patient flings arms out and wil grab anything- at times grabs therapists ophone, gowwn, mask. assited  to sitting with significant efforts to prevent arms flinging then resisting  and/or grsping rails.  Transfers                 General transfer comment: unable to attempt due to patient's resisistance and extension posturing  Ambulation/Gait                 Stairs             Wheelchair Mobility    Modified Rankin (Stroke Patients Only)       Balance Overall balance assessment: Needs assistance Sitting-balance support: Feet supported;Feet unsupported Sitting balance-Leahy Scale: Zero Sitting balance - Comments: pushes  strongly into extension. slow steady pressure to flex trunk.                                     Cognition Arousal/Alertness: Awake/alert Behavior During Therapy: Restless;Anxious                           Following Commands: Follows one step commands inconsistently     Problem Solving: Difficulty sequencing;Decreased initiation;Requires verbal cues;Requires tactile cues General Comments: pt, repeats" something is not right", states that she cannot see, very resistive with any movements      Exercises      General Comments        Pertinent Vitals/Pain Pain Assessment: Faces Faces Pain Scale: Hurts even more Pain Location: neck during attempts to move Pain Descriptors / Indicators: Grimacing;Guarding Pain Intervention(s): Monitored during session;Repositioned    Home Living  Prior Function            PT Goals (current goals can now be found in the care plan section) Progress towards PT goals: Not progressing toward goals - comment(severe tone and responses to movement)    Frequency    Min 3X/week      PT Plan Current plan remains appropriate    Co-evaluation              AM-PAC PT "6 Clicks" Mobility   Outcome Measure  Help needed turning from your back to your side while in a flat bed without using bedrails?: Total Help needed moving from lying on your back to sitting on the side of a flat bed without using bedrails?:  Total Help needed moving to and from a bed to a chair (including a wheelchair)?: Total Help needed standing up from a chair using your arms (e.g., wheelchair or bedside chair)?: Total Help needed to walk in hospital room?: Total Help needed climbing 3-5 steps with a railing? : Total 6 Click Score: 6    End of Session   Activity Tolerance: Patient tolerated treatment well(restless and resistance to postural changes) Patient left: in bed;with call bell/phone within reach;with bed alarm set Nurse Communication: Mobility status PT Visit Diagnosis: Muscle weakness (generalized) (M62.81);Difficulty in walking, not elsewhere classified (R26.2)     Time: 4035-2481 PT Time Calculation (min) (ACUTE ONLY): 40 min  Charges:  $Therapeutic Activity: 38-52 mins                     [name] PT Acute Rehabilitation Services Pager [phone] Office [phone]    [name] 05/25/2019, 3:44 PM

## 2019-05-25 NOTE — Progress Notes (Signed)
  Speech Language Pathology Treatment: Dysphagia  Patient Details Name: Melissa Fischer MRN: [id number] DOB: 09-10-1934 Today's Date: 05/25/2019 Time: 4103-0131 SLP Time Calculation (min) (ACUTE ONLY): 11 min  Assessment / Plan / Recommendation Clinical Impression  Pt had less interest in food this afternoon, taking several bites of puree and sips of thin liquids, but not as easily redirected to taking more. SLP also provided trials of soft solids, with pt demonstrating limited biting but appropriate mastication. Despite mildly prolonged but functional bolus formation, pt subjectively said that the food was "too hard" and would not take more. Will keep diet pureed for now with potential for additional advanced solids with SLP. Would offer her food and drink as frequently as possible throughout the day to try to increase overall intake.   HPI HPI: 85 y.o. female with PMH of Alzheimers dementia with behavioral disturbance, type 2 diabetes mellitus, COPD, and history of mild renal insufficiency; admitted on 05/20/2019, presented with complaint of confusion and slurred speech, was found to have failure to thrive and severe dehydration.  Covid 19 (+); transferred to [place] 5/24.  Pt with acute metabolic encephalopathy, CT negative for acute event.       SLP Plan  Continue with current plan of care       Recommendations  Diet recommendations: Dysphagia 1 (puree);Thin liquid Liquids provided via: Straw Medication Administration: Whole meds with puree Supervision: Staff to assist with self feeding;Full supervision/cueing for compensatory strategies Compensations: Minimize environmental distractions Postural Changes and/or Swallow Maneuvers: Seated upright 90 degrees                Oral Care Recommendations: Oral care BID Follow up Recommendations: 24 hour supervision/assistance SLP Visit Diagnosis: Dysphagia, unspecified (R13.10) Plan: Continue with current plan of care       GO                 Melissa Fischer Melissa Fischer 05/25/2019, 2:39 PM  [name], M.A. [place] Acute Environmental education officer [phone] Office [phone]

## 2019-05-25 NOTE — Progress Notes (Signed)
PROGRESS NOTE                                                                                                                                                                                                             Patient Demographics:    Melissa Fischer, is a 85 y.o. female, DOB - [birth year], OZH:[id number]  Outpatient Primary MD for the patient is Lajean Manes, MD    LOS - 4  Admit date - 05/20/2019    Chief Complaint  Patient presents with   Altered Mental Status   Weakness       Brief Narrative Melissa Fischer a 85 y.o.Fwith dementia, DM, and COPD who presented from SNF with confusion and slurred speech. In the ER, repeat CT head (had had one 4 days prior) showed no stroke. COVID screening positive. Not hypoxic, CXR clear.   Subjective:   Patient in bed, appears to be in no distress, pleasantly confused but states she is feeling weak all over, no shortness of breath.   Assessment  & Plan :     1. Toxic encephalopathy due to acute Covid 19 Viral Illness during the ongoing 2020 Covid 19 Pandemic - CT head x2 along with CT C-spine nonacute, stable UA and chest x-ray. She is oxygenating well on room air.  No focal deficits, mentation is stable but still delirious, she has underlying advanced dementia and likely will continue to have delirium while in the hospital setting.  Minimize narcotics and benzodiazepines.  Supportive care, advance activity, will require SNF.  No pulmonary symptoms we will continue to monitor her inflammatory markers and clinically.  COVID-19 Labs  Recent Labs    05/24/19 0400 05/25/19 0324  DDIMER 1.51* 1.67*  FERRITIN 293 303  CRP 5.9* 5.1*    Lab Results  Component Value Date   SARSCOV2NAA POSITIVE (A) 05/20/2019     No results found for: BNP  No results for input(s): PROBNP in the last 8760 hours.   2.  Recent history of dental infection.  Maxillofacial CT nonacute, on  clindamycin continue, outpatient dental follow-up.  3.  Advanced dementia.  At risk for delirium, minimize narcotics and benzodiazepines, did not tolerate Exelon, Namenda or Aricept in the past.  Supportive care.  4.  COPD.  Stable supportive care.  5.  Dehydration.  She has been hydrated with IV  fluids.    6.  Severe hypokalemia and hypomagnesemia.  Both replaced IV.  Will monitor.    7. DM type II.  On sliding scale continue to monitor.  Lab Results  Component Value Date   HGBA1C 7.9 (H) 01/26/2017   CBG (last 3)  Recent Labs    05/24/19 1714 05/24/19 2114 05/25/19 0819  GLUCAP 193* 129* 155*      Condition - Fair   Family Communication  : Updated daughter in detail on 05/24/2019, DNR.  Code Status : DNR  Diet : Soft dysphagia 1 diet  Disposition Plan  : SNF in a day or two if bed available  Consults  : None  Procedures  :    CT head x2 along with CT C-spine and maxillofacial CT.  Nonacute.  CT abdomen pelvis. Diverticulosis without diverticulitis. No acute abnormality noted   DVT Prophylaxis  :  Lovenox added  Lab Results  Component Value Date   PLT 443 (H) 05/25/2019    Inpatient Medications  Scheduled Meds:  chlorhexidine  15 mL Mouth Rinse BID   enoxaparin (LOVENOX) injection  40 mg Subcutaneous Q24H   feeding supplement (ENSURE ENLIVE)  237 mL Oral TID BM   insulin aspart  0-5 Units Subcutaneous QHS   insulin aspart  0-9 Units Subcutaneous TID WC   mouth rinse  15 mL Mouth Rinse q12n4p   mometasone-formoterol  2 puff Inhalation BID   potassium chloride  40 mEq Oral BID   sodium chloride flush  3 mL Intravenous Q12H   Continuous Infusions:  sodium chloride 1,000 mL (05/24/19 1432)   clindamycin (CLEOCIN) IV 600 mg (05/25/19 0535)   potassium chloride 10 mEq (05/25/19 1033)   PRN Meds:.sodium chloride, acetaminophen **OR** acetaminophen, albuterol, antiseptic oral rinse, lip balm, LORazepam, [DISCONTINUED] ondansetron **OR**  ondansetron (ZOFRAN) IV, senna-docusate  Antibiotics  :    Anti-infectives (From admission, onward)   Start     Dose/Rate Route Frequency Ordered Stop   05/21/19 1400  clindamycin (CLEOCIN) IVPB 600 mg     600 mg 100 mL/hr over 30 Minutes Intravenous Every 8 hours 05/21/19 1212         Time Spent in minutes  30   Lala Lund M.D on 05/25/2019 at 11:28 AM  To page go to [email or web address] - password [email or web address]  [facility] -  Office  [phone]  See all Orders from today for further details    Objective:   Vitals:   05/25/19 0005 05/25/19 0418 05/25/19 0539 05/25/19 0800  BP: (!) 168/67 (!) 172/62 (!) 167/86 (!) 171/66  Pulse: 71 75 85 66  Resp: 13 17 15 13   Temp: 97.8 F (36.6 C) 98.1 F (36.7 C)  98.5 F (36.9 C)  TempSrc: Axillary Axillary    SpO2: 100% 99% 100% 99%  Weight:      Height:        Wt Readings from Last 3 Encounters:  05/20/19 63.2 kg  12/06/18 70.6 kg  12/02/17 74.4 kg     Intake/Output Summary (Last 24 hours) at 05/25/2019 1128 Last data filed at 05/25/2019 0859 Gross per 24 hour  Intake 819.39 ml  Output 900 ml  Net -80.61 ml     Physical Exam  Awake but pleasantly confused, no focal deficits,  [place].AT,PERRAL Supple Neck,No JVD, No cervical lymphadenopathy appriciated.  Symmetrical Chest wall movement, Good air movement bilaterally, CTAB RRR,No Gallops, Rubs or new Murmurs, No Parasternal Heave +ve B.Sounds, Abd Soft, No tenderness, No organomegaly appriciated, No  rebound - guarding or rigidity. No Cyanosis, Clubbing or edema, No new Rash or bruise    Data Review:    CBC Recent Labs  Lab 05/20/19 1615 05/21/19 0030 05/22/19 0501 05/23/19 0400 05/24/19 0400 05/25/19 0324  WBC 11.4* 11.0* 8.3 10.4 12.6* 14.2*  HGB 12.8 12.7 12.6 12.0 11.4* 12.2  HCT 38.8 36.8   36.0 37.4 36.0 34.7* 36.5  PLT 393 358 390 400 387 443*  MCV 90.7 86.6 89.3 90.5 89.2 87.7  MCH 29.9 29.9 30.1 30.2 29.3 29.3  MCHC 33.0 34.5 33.7 33.3 32.9 33.4   RDW 12.0 11.9 12.0 12.1 11.9 12.0  LYMPHSABS 2.4 2.6  --   --   --  3.0  MONOABS 1.0 1.2*  --   --   --  0.8  EOSABS 0.1 0.1  --   --   --  0.2  BASOSABS 0.0 0.1  --   --   --  0.1    Chemistries  Recent Labs  Lab 05/20/19 1615 05/21/19 0030 05/22/19 0501 05/23/19 0400 05/24/19 0400 05/25/19 0324  NA 144 142 141 141 138 135  K 3.6 3.5 3.3* 3.3* 3.5 2.8*  CL 107 105 103 104 105 102  CO2 25 20* 26 25 20* 21*  GLUCOSE 231* 212* 172* 149* 148* 133*  BUN 28* 22 18 21 14 12   CREATININE 0.90 0.87 0.78 0.76 0.67 0.60  CALCIUM 10.1 9.5 9.0 8.8* 8.2* 8.0*  MG  --   --   --   --   --  1.6*  AST 14*  --   --   --   --  21  ALT 14  --   --   --   --  16  ALKPHOS 83  --   --   --   --  69  BILITOT 0.6  --   --   --   --  0.6   ------------------------------------------------------------------------------------------------------------------ No results for input(s): CHOL, HDL, LDLCALC, TRIG, CHOLHDL, LDLDIRECT in the last 72 hours.  Lab Results  Component Value Date   HGBA1C 7.9 (H) 01/26/2017   ------------------------------------------------------------------------------------------------------------------ No results for input(s): TSH, T4TOTAL, T3FREE, THYROIDAB in the last 72 hours.  Invalid input(s): FREET3  Cardiac Enzymes No results for input(s): CKMB, TROPONINI, MYOGLOBIN in the last 168 hours.  Invalid input(s): CK ------------------------------------------------------------------------------------------------------------------ No results found for: BNP  Micro Results Recent Results (from the past 240 hour(s))  Blood Cultures (routine x 2)     Status: None   Collection Time: 05/20/19  3:07 PM  Result Value Ref Range Status   Specimen Description BLOOD LEFT ANTECUBITAL  Final   Special Requests   Final    BOTTLES DRAWN AEROBIC AND ANAEROBIC Blood Culture adequate volume   Culture   Final    NO GROWTH 5 DAYS Performed at [facility], [address],  [place], [place] [address]    Report Status 05/25/2019 FINAL  Final  Blood Cultures (routine x 2)     Status: None   Collection Time: 05/20/19  5:30 PM  Result Value Ref Range Status   Specimen Description BLOOD RIGHT WRIST  Final   Special Requests   Final    BOTTLES DRAWN AEROBIC ONLY Blood Culture results may not be optimal due to an inadequate volume of blood received in culture bottles   Culture   Final    NO GROWTH 5 DAYS Performed at [facility], [place] [address], [place], [place] [address]  Report Status 05/25/2019 FINAL  Final  Urine culture     Status: None   Collection Time: 05/20/19  9:32 PM  Result Value Ref Range Status   Specimen Description URINE, CATHETERIZED  Final   Special Requests NONE  Final   Culture   Final    NO GROWTH Performed at [facility], [address] [place], [place] [address]    Report Status 05/24/2019 FINAL  Final  SARS Coronavirus 2 (CEPHEID- Performed in [place] hospital lab), Hosp Order     Status: Abnormal   Collection Time: 05/20/19 11:01 PM  Result Value Ref Range Status   SARS Coronavirus 2 POSITIVE (A) NEGATIVE Final    Comment: RESULT CALLED TO, READ BACK BY AND VERIFIED WITH: C. THOMPSON,CHARGE RN 0031 05/21/2019 T. TYSOR (NOTE) If result is NEGATIVE SARS-CoV-2 target nucleic acids are NOT DETECTED. The SARS-CoV-2 RNA is generally detectable in upper and lower  respiratory specimens during the acute phase of infection. The lowest  concentration of SARS-CoV-2 viral copies this assay can detect is 250  copies / mL. A negative result does not preclude SARS-CoV-2 infection  and should not be used as the sole basis for treatment or other  patient management decisions.  A negative result may occur with  improper specimen collection / handling, submission of specimen other  than nasopharyngeal swab, presence of viral mutation(s) within the  areas targeted by this assay, and inadequate number of viral copies  (<250 copies /  mL). A negative result must be combined with clinical  observations, patient history, and epidemiological information. If result is POSITIVE SARS-CoV-2 target nucleic acids are D ETECTED. The SARS-CoV-2 RNA is generally detectable in upper and lower  respiratory specimens during the acute phase of infection.  Positive  results are indicative of active infection with SARS-CoV-2.  Clinical  correlation with patient history and other diagnostic information is  necessary to determine patient infection status.  Positive results do  not rule out bacterial infection or co-infection with other viruses. If result is PRESUMPTIVE POSTIVE SARS-CoV-2 nucleic acids MAY BE PRESENT.   A presumptive positive result was obtained on the submitted specimen  and confirmed on repeat testing.  While 2019 novel coronavirus  (SARS-CoV-2) nucleic acids may be present in the submitted sample  additional confirmatory testing may be necessary for epidemiological  and / or clinical management purposes  to differentiate between  SARS-CoV-2 and other Sarbecovirus currently known to infect humans.  If clinically indicated additional testing with an alternate test  methodology [phone]) is advised. The SARS-CoV-2 RNA is generally  detectable in upper and lower respiratory specimens during the acute  phase of infection. The expected result is Negative. Fact Sheet for Patients:  StrictlyIdeas.no Fact Sheet for Healthcare Providers: BankingDealers.co.za This test is not yet approved or cleared by the [place] FDA and has been authorized for detection and/or diagnosis of SARS-CoV-2 by FDA under an Emergency Use Authorization (EUA).  This EUA will remain in effect (meaning this test can be used) for the duration of the COVID-19 declaration under Section 564(b)(1) of the Act, 21 U.S.C. section 360bbb-3(b)(1), unless the authorization is terminated or revoked  sooner. Performed at [facility], [place] [address], [place], [place] [address]   MRSA PCR Screening     Status: None   Collection Time: 05/21/19 12:52 AM  Result Value Ref Range Status   MRSA by PCR NEGATIVE NEGATIVE Final    Comment:  The GeneXpert MRSA Assay (FDA approved for NASAL specimens only), is one component of a comprehensive MRSA colonization surveillance program. It is not intended to diagnose MRSA infection nor to guide or monitor treatment for MRSA infections. Performed at [facility], [place] [address] [place], [place] [address]     Radiology Reports Dg Chest 1 View  Result Date: 05/08/2019 CLINICAL DATA:  Unwitnessed fall. EXAM: CHEST  1 VIEW COMPARISON:  Radiographs of January 31, 2017. FINDINGS: The heart size and mediastinal contours are within normal limits. Both lungs are clear. No pneumothorax or pleural effusion is noted. Left-sided pacemaker is unchanged in position. The visualized skeletal structures are unremarkable. IMPRESSION: No active disease. Electronically Signed   By: Marijo Conception M.D.   On: 05/08/2019 14:20   Ct Head Wo Contrast  Result Date: 05/21/2019 CLINICAL DATA:  Altered mental status, right-sided weakness, and slurred speech. EXAM: CT HEAD WITHOUT CONTRAST TECHNIQUE: Contiguous axial images were obtained from the base of the skull through the vertex without intravenous contrast. COMPARISON:  05/20/2019 FINDINGS: Brain: There is no evidence of acute infarct, intracranial hemorrhage, mass, midline shift, or extra-axial fluid collection. Patchy to confluent cerebral white matter hypodensities are unchanged and nonspecific but compatible with moderate to severe chronic small vessel ischemic disease. Moderate to severe cerebral atrophy is again noted. Vascular: Calcified atherosclerosis at the skull base. No hyperdense vessel. Skull: No acute fracture or suspicious osseous lesion. Chronic deformity of the right zygomatic arch.  Sinuses/Orbits: Paranasal sinuses and mastoid air cells are clear. Bilateral cataract extraction. Other: None. IMPRESSION: 1. No evidence of acute intracranial abnormality. 2. Moderate to severe chronic small vessel ischemic disease and cerebral atrophy. Electronically Signed   By: Logan Bores M.D.   On: 05/21/2019 14:47   Ct Head Wo Contrast  Result Date: 05/20/2019 CLINICAL DATA:  Fall neck pain and altered mental status EXAM: CT HEAD WITHOUT CONTRAST CT CERVICAL SPINE WITHOUT CONTRAST TECHNIQUE: Multidetector CT imaging of the head and cervical spine was performed following the standard protocol without intravenous contrast. Multiplanar CT image reconstructions of the cervical spine were also generated. COMPARISON:  CT head and cervical spine 05/08/2019 FINDINGS: CT HEAD FINDINGS Brain: There is no mass, hemorrhage or extra-axial collection. There is generalized atrophy without lobar predilection. There is hypoattenuation of the periventricular white matter, most commonly indicating chronic ischemic microangiopathy. Vascular: No abnormal hyperdensity of the major intracranial arteries or dural venous sinuses. No intracranial atherosclerosis. Skull: The visualized skull base, calvarium and extracranial soft tissues are normal. Sinuses/Orbits: No fluid levels or advanced mucosal thickening of the visualized paranasal sinuses. No mastoid or middle ear effusion. The orbits are normal. CT CERVICAL SPINE FINDINGS Alignment: Grade 1 anterolisthesis at C4-5. Skull base and vertebrae: No acute fracture. Soft tissues and spinal canal: No prevertebral fluid or swelling. No visible canal hematoma. Disc levels: Multilevel moderate-to-severe facet hypertrophy. Upper chest: No pneumothorax, pulmonary nodule or pleural effusion. Other: Normal visualized paraspinal cervical soft tissues. IMPRESSION: 1. No acute abnormality of the head or cervical spine. 2. Chronic ischemic microangiopathy and generalized volume loss. 3.  Multilevel cervical facet arthrosis with grade 1 anterolisthesis at C4-5, unchanged. Electronically Signed   By: Ulyses Jarred M.D.   On: 05/20/2019 20:30   Ct Head Wo Contrast  Result Date: 05/08/2019 CLINICAL DATA:  85 year old female with head and neck injury following fall. EXAM: CT HEAD WITHOUT CONTRAST CT CERVICAL SPINE WITHOUT CONTRAST TECHNIQUE: Multidetector CT imaging of the head and cervical spine was performed following  the standard protocol without intravenous contrast. Multiplanar CT image reconstructions of the cervical spine were also generated. COMPARISON:  07/16/2018 and prior CTs FINDINGS: CT HEAD FINDINGS Brain: No evidence of acute infarction, hemorrhage, hydrocephalus, extra-axial collection or mass lesion/mass effect. Atrophy and chronic small-vessel white matter ischemic changes again noted. Vascular: Carotid atherosclerotic calcifications again noted. Skull: No acute abnormality or suspicious focal lesion. A remote RIGHT zygoma fracture is noted. Sinuses/Orbits: No acute finding. Other: None. CT CERVICAL SPINE FINDINGS Alignment: Normal. Skull base and vertebrae: No acute fracture. No primary bone lesion or focal pathologic process. Soft tissues and spinal canal: No prevertebral fluid or swelling. No visible canal hematoma. Disc levels: Mild moderate multilevel degenerative disc disease/spondylosis noted, greatest at C5-6. Moderate multilevel facet arthropathy identified. Upper chest: No acute abnormality Other: None IMPRESSION: 1. No evidence of acute intracranial abnormality. Atrophy and chronic small-vessel white matter ischemic changes. 2. No static evidence of acute injury to the cervical spine. Multilevel degenerative changes. Electronically Signed   By: Margarette Canada M.D.   On: 05/08/2019 14:37   Ct Cervical Spine Wo Contrast  Result Date: 05/20/2019 CLINICAL DATA:  Fall neck pain and altered mental status EXAM: CT HEAD WITHOUT CONTRAST CT CERVICAL SPINE WITHOUT CONTRAST  TECHNIQUE: Multidetector CT imaging of the head and cervical spine was performed following the standard protocol without intravenous contrast. Multiplanar CT image reconstructions of the cervical spine were also generated. COMPARISON:  CT head and cervical spine 05/08/2019 FINDINGS: CT HEAD FINDINGS Brain: There is no mass, hemorrhage or extra-axial collection. There is generalized atrophy without lobar predilection. There is hypoattenuation of the periventricular white matter, most commonly indicating chronic ischemic microangiopathy. Vascular: No abnormal hyperdensity of the major intracranial arteries or dural venous sinuses. No intracranial atherosclerosis. Skull: The visualized skull base, calvarium and extracranial soft tissues are normal. Sinuses/Orbits: No fluid levels or advanced mucosal thickening of the visualized paranasal sinuses. No mastoid or middle ear effusion. The orbits are normal. CT CERVICAL SPINE FINDINGS Alignment: Grade 1 anterolisthesis at C4-5. Skull base and vertebrae: No acute fracture. Soft tissues and spinal canal: No prevertebral fluid or swelling. No visible canal hematoma. Disc levels: Multilevel moderate-to-severe facet hypertrophy. Upper chest: No pneumothorax, pulmonary nodule or pleural effusion. Other: Normal visualized paraspinal cervical soft tissues. IMPRESSION: 1. No acute abnormality of the head or cervical spine. 2. Chronic ischemic microangiopathy and generalized volume loss. 3. Multilevel cervical facet arthrosis with grade 1 anterolisthesis at C4-5, unchanged. Electronically Signed   By: Ulyses Jarred M.D.   On: 05/20/2019 20:30   Ct Cervical Spine Wo Contrast  Result Date: 05/08/2019 CLINICAL DATA:  85 year old female with head and neck injury following fall. EXAM: CT HEAD WITHOUT CONTRAST CT CERVICAL SPINE WITHOUT CONTRAST TECHNIQUE: Multidetector CT imaging of the head and cervical spine was performed following the standard protocol without intravenous contrast.  Multiplanar CT image reconstructions of the cervical spine were also generated. COMPARISON:  07/16/2018 and prior CTs FINDINGS: CT HEAD FINDINGS Brain: No evidence of acute infarction, hemorrhage, hydrocephalus, extra-axial collection or mass lesion/mass effect. Atrophy and chronic small-vessel white matter ischemic changes again noted. Vascular: Carotid atherosclerotic calcifications again noted. Skull: No acute abnormality or suspicious focal lesion. A remote RIGHT zygoma fracture is noted. Sinuses/Orbits: No acute finding. Other: None. CT CERVICAL SPINE FINDINGS Alignment: Normal. Skull base and vertebrae: No acute fracture. No primary bone lesion or focal pathologic process. Soft tissues and spinal canal: No prevertebral fluid or swelling. No visible canal hematoma. Disc levels: Mild moderate multilevel degenerative  disc disease/spondylosis noted, greatest at C5-6. Moderate multilevel facet arthropathy identified. Upper chest: No acute abnormality Other: None IMPRESSION: 1. No evidence of acute intracranial abnormality. Atrophy and chronic small-vessel white matter ischemic changes. 2. No static evidence of acute injury to the cervical spine. Multilevel degenerative changes. Electronically Signed   By: Margarette Canada M.D.   On: 05/08/2019 14:37   Ct Abdomen Pelvis W Contrast  Result Date: 05/20/2019 CLINICAL DATA:  Fall several days ago with persistent weakness and abdominal pain EXAM: CT ABDOMEN AND PELVIS WITH CONTRAST TECHNIQUE: Multidetector CT imaging of the abdomen and pelvis was performed using the standard protocol following bolus administration of intravenous contrast. CONTRAST:  170mL OMNIPAQUE IOHEXOL 300 MG/ML  SOLN COMPARISON:  None. FINDINGS: Lower chest: No acute abnormality. Hepatobiliary: No focal liver abnormality is seen. No gallstones, gallbladder wall thickening, or biliary dilatation. Pancreas: Unremarkable. No pancreatic ductal dilatation or surrounding inflammatory changes. Spleen:  Normal in size without focal abnormality. Adrenals/Urinary Tract: Adrenal glands are within normal limits. The kidneys are well visualized bilaterally. No renal calculi or obstructive changes are seen. Normal excretion is noted bilaterally. Stomach/Bowel: Mild diverticular changes noted. There are changes suggestive of prior appendectomy. Clinical correlation is recommended. No inflammatory changes are seen. The stomach and small bowel appear within normal limits. Vascular/Lymphatic: Aortic atherosclerosis. No enlarged abdominal or pelvic lymph nodes. Reproductive: Status post hysterectomy. No adnexal masses. Other: No abdominal wall hernia or abnormality. No abdominopelvic ascites. Musculoskeletal: Postsurgical changes are noted in the right hip. No acute bony abnormality is noted. IMPRESSION: Diverticulosis without diverticulitis. No acute abnormality noted. Electronically Signed   By: Inez Catalina M.D.   On: 05/20/2019 20:29   Ct Maxillofacial W Contrast  Result Date: 05/21/2019 CLINICAL DATA:  Oral cavity pain with bleeding and foul odor. EXAM: CT MAXILLOFACIAL WITH CONTRAST TECHNIQUE: Multidetector CT imaging of the maxillofacial structures was performed with intravenous contrast. Multiplanar CT image reconstructions were also generated. CONTRAST:  40mL OMNIPAQUE IOHEXOL 300 MG/ML  SOLN COMPARISON:  None. FINDINGS: The study is mildly motion degraded. Osseous: No acute fracture is identified within limitations of motion artifact. No destructive osseous lesion. No mandibular dislocation. Orbits: Bilateral cataract extraction. No acute inflammatory or traumatic findings. Sinuses: Paranasal sinuses and mastoid air cells are clear. Soft tissues: No oral cavity or pharyngeal mass identified within limitations of dental streak artifact. No peritonsillar or retropharyngeal fluid collection. No parotid or submandibular space inflammation. Calcified atherosclerosis at the carotid bifurcations without evidence of  significant stenosis. Limited intracranial: The brain is more fully evaluated on separate dedicated head CT. No abnormal intracranial enhancement is identified. IMPRESSION: No acute abnormality identified. Electronically Signed   By: Logan Bores M.D.   On: 05/21/2019 14:43   Dg Chest Port 1 View  Result Date: 05/20/2019 CLINICAL DATA:  Altered mental status. EXAM: PORTABLE CHEST 1 VIEW COMPARISON:  Radiograph of May 08, 2019. FINDINGS: The heart size and mediastinal contours are within normal limits. Both lungs are clear. No pneumothorax or pleural effusion is noted. Left-sided pacemaker is unchanged in position. The visualized skeletal structures are unremarkable. IMPRESSION: No active disease. Electronically Signed   By: Marijo Conception M.D.   On: 05/20/2019 16:46   Dg Hips Bilat W Or Wo Pelvis 2 Views  Result Date: 05/08/2019 CLINICAL DATA:  Unwitnessed fall. EXAM: DG HIP (WITH OR WITHOUT PELVIS) 2V BILAT COMPARISON:  Radiographs of July 16, 2018. FINDINGS: There is no evidence of acute hip fracture or dislocation. There is no evidence of arthropathy.  Status post surgical repair of old right proximal femoral fracture. IMPRESSION: No acute abnormality seen involving either hip. Electronically Signed   By: Marijo Conception M.D.   On: 05/08/2019 14:22

## 2019-05-25 NOTE — Progress Notes (Signed)
Initial Nutrition Assessment   RD working remotely.   DOCUMENTATION CODES:   Not applicable  INTERVENTION:   Inadequate nutrition for >/= 5 days; If po intake remains inadequate and if aligns with goal of care, recommend insertion of NG tube with initiation of TF   Ensure Enlive po TID between meals trays, each supplement provides 350 kcal and 20 grams of protein  Pt receiving Hormel Shake daily with Breakfast which provides 520 kcals and 22 g of protein and Magic cup BID with lunch and dinner, each supplement provides 290 kcal and 9 grams of protein, automatically on meal trays to optimize nutritional intake.    NUTRITION DIAGNOSIS:   Inadequate oral intake related to poor appetite, acute illness as evidenced by meal completion < 25%.  GOAL:   Patient will meet greater than or equal to 90% of their needs  MONITOR:   PO intake, Supplement acceptance, Labs, Weight trends  REASON FOR ASSESSMENT:   Malnutrition Screening Tool    ASSESSMENT:   85 yo female admitted with admitted with confusion and slurred speech with toxic encephalopathy due to COVID-19 viral illness. PMH includes dementia, DM, COPD  Recorded po intake 0-10% of meals on 5/26. Pt NPO from 5/22 until 5/25 when diet advanced to Dysphagia I, Thins in the late afternoon  Inadequate nutrition x 5 days; if po intake does not improve and aligns with goals of care, recommend insertion of NG tube with initiation of TF   Per weight encounters, pt with wt loss of 10.5 % in 6 months. Current wt 62.3 kg.   Unable to obtain diet and weight history from pt due to advanced dementia.   Labs: potassium 2.8 (L), magnesium 1.6 (L) Meds: KCl, mag sulfate   NUTRITION - FOCUSED PHYSICAL EXAM:  Unable to assess; RD working remotely  Diet Order:   Diet Order            DIET - DYS 1 Room service appropriate? Yes; Fluid consistency: Thin  Diet effective now              EDUCATION NEEDS:   No education needs have  been identified at this time  Skin:  Skin Assessment: Reviewed RN Assessment(no pressure injuries noted)  Last BM:  no documented BM  Height:   Ht Readings from Last 1 Encounters:  05/20/19 5\' 8"  (1.727 m)    Weight:   Wt Readings from Last 1 Encounters:  05/20/19 63.2 kg    BMI:  Body mass index is 21.19 kg/m.  Estimated Nutritional Needs:   Kcal:  1500-1650 kcals   Protein:  75-83 g of protein   Fluid:  >/= 1.5 L    Kerman Passey MS, RD, LDN, CNSC [phone] Pager  [phone] Weekend/On-Call Pager

## 2019-05-25 NOTE — Progress Notes (Signed)
RN and SLP therapist spoke to daughter [name]. Daughter was updated on nutritional status and mobility concerns. Daughter requested options about if patient continues to not have appetite. RN referenced NG tube for temporary solution, PEG tubes and possibly TPN. Daughter was adamant about no NG tube due to fear of pulling out tube. We also began a conversation on quality of life and what goals should be. RN stated that no decisions need to be made at that moment. Family informed those decisions will be made with MD and other team members.

## 2019-05-25 NOTE — Progress Notes (Signed)
SLP Contact Note:  SLP talked to daughter, [name], via phone along with RN, to discuss current swallowing function. [name] shared that pt was on regular solids, thin liquids at baseline. She was also ambulating with a walker. I reviewed today's session, including attempts at more solid POs, and shared that although her diet is modified, I believe it is at least a safe diet for her to be on with lower risk for aspiration.   [name] also appropriately asked about pt's nutrition and hydration if she were to continue to decline POs. Education was provided about cognitive impact on adequacy of intake. RN provided examples of potential options, such as various alternative sources for nutrition. SLP encouraged pt to think about overall QOL while considering those options as well. Long-term alternative nutrition is not necessarily indicated for a person with advanced dementia, and [name] voiced her concerns about her mother likely pulling an NGT. She was encouraged to see how her mother's appetite improves as she stays here, and to consider her options for if it does not. SLP suggested that she continue to discuss these concerns with pt's MD, and SLP will remain available for additional education and updates.  [name], M.A. [place] Acute Environmental education officer [phone] Office [phone]

## 2019-05-26 LAB — COMPREHENSIVE METABOLIC PANEL
ALT: 14 U/L (ref 0–44)
AST: 18 U/L (ref 15–41)
Albumin: 3.3 g/dL — ABNORMAL LOW (ref 3.5–5.0)
Alkaline Phosphatase: 82 U/L (ref 38–126)
Anion gap: 10 (ref 5–15)
BUN: 7 mg/dL — ABNORMAL LOW (ref 8–23)
CO2: 23 mmol/L (ref 22–32)
Calcium: 8.2 mg/dL — ABNORMAL LOW (ref 8.9–10.3)
Chloride: 100 mmol/L (ref 98–111)
Creatinine, Ser: 0.55 mg/dL (ref 0.44–1.00)
GFR calc Af Amer: >60 mL/min (ref >60)
GFR calc non Af Amer: >60 mL/min (ref >60)
Glucose, Bld: 226 mg/dL — ABNORMAL HIGH (ref 70–99)
Potassium: 3.5 mmol/L (ref 3.5–5.1)
Sodium: 133 mmol/L — ABNORMAL LOW (ref 135–145)
Total Bilirubin: 0.5 mg/dL (ref 0.3–1.2)
Total Protein: 6.5 g/dL (ref 6.5–8.1)

## 2019-05-26 LAB — CBC WITH DIFFERENTIAL/PLATELET
Abs Immature Granulocytes: 0.35 10*3/uL — ABNORMAL HIGH (ref 0.00–0.07)
Basophils Absolute: 0.1 10*3/uL (ref 0.0–0.1)
Basophils Relative: 1 %
Eosinophils Absolute: 0.2 10*3/uL (ref 0.0–0.5)
Eosinophils Relative: 1 %
HCT: 34.2 % — ABNORMAL LOW (ref 36.0–46.0)
Hemoglobin: 12 g/dL (ref 12.0–15.0)
Immature Granulocytes: 2 %
Lymphocytes Relative: 14 %
Lymphs Abs: 2.4 10*3/uL (ref 0.7–4.0)
MCH: 30.6 pg (ref 26.0–34.0)
MCHC: 35.1 g/dL (ref 30.0–36.0)
MCV: 87.2 fL (ref 80.0–100.0)
Monocytes Absolute: 1.4 10*3/uL — ABNORMAL HIGH (ref 0.1–1.0)
Monocytes Relative: 8 %
Neutro Abs: 12.9 10*3/uL — ABNORMAL HIGH (ref 1.7–7.7)
Neutrophils Relative %: 74 %
Platelets: 438 10*3/uL — ABNORMAL HIGH (ref 150–400)
RBC: 3.92 MIL/uL (ref 3.87–5.11)
RDW: 12.1 % (ref 11.5–15.5)
WBC: 17.3 10*3/uL — ABNORMAL HIGH (ref 4.0–10.5)
nRBC: 0 % (ref 0.0–0.2)

## 2019-05-26 LAB — GLUCOSE, CAPILLARY
Glucose-Capillary: 148 mg/dL — ABNORMAL HIGH (ref 70–99)
Glucose-Capillary: 224 mg/dL — ABNORMAL HIGH (ref 70–99)
Glucose-Capillary: 309 mg/dL — ABNORMAL HIGH (ref 70–99)

## 2019-05-26 LAB — MAGNESIUM: Magnesium: 2 mg/dL (ref 1.7–2.4)

## 2019-05-26 LAB — FERRITIN: Ferritin: 267 ng/mL (ref 11–307)

## 2019-05-26 LAB — C-REACTIVE PROTEIN: CRP: 9.2 mg/dL — ABNORMAL HIGH (ref <1.0)

## 2019-05-26 LAB — D-DIMER, QUANTITATIVE: D-Dimer, Quant: 1.3 ug/mL-FEU — ABNORMAL HIGH (ref 0.00–0.50)

## 2019-05-26 MED ORDER — POTASSIUM CHLORIDE 2 MEQ/ML IV SOLN
INTRAVENOUS | Status: DC
  Administered 2019-05-26: 10:00:00 via INTRAVENOUS
  Filled 2019-05-26: qty 1000

## 2019-05-26 MED ORDER — FUROSEMIDE 10 MG/ML IJ SOLN
20.0000 mg | Freq: Once | INTRAMUSCULAR | Status: AC
  Administered 2019-05-26: 20 mg via INTRAVENOUS
  Filled 2019-05-26: qty 2

## 2019-05-26 MED ORDER — POTASSIUM CHLORIDE 2 MEQ/ML IV SOLN
INTRAVENOUS | Status: DC
  Administered 2019-05-26: 07:00:00 via INTRAVENOUS
  Filled 2019-05-26: qty 1000

## 2019-05-26 MED ORDER — LACTATED RINGERS IV SOLN
INTRAVENOUS | Status: DC
  Administered 2019-05-26 (×2): via INTRAVENOUS

## 2019-05-26 MED ORDER — HALOPERIDOL 1 MG PO TABS
1.0000 mg | ORAL_TABLET | Freq: Once | ORAL | Status: AC
  Filled 2019-05-26: qty 1

## 2019-05-26 MED ORDER — HYDRALAZINE HCL 20 MG/ML IJ SOLN: 10.0000 mg | Freq: Four times a day (QID) | INTRAMUSCULAR | Status: DC | PRN

## 2019-05-26 MED ORDER — HALOPERIDOL LACTATE 5 MG/ML IJ SOLN
1.0000 mg | Freq: Once | INTRAMUSCULAR | Status: AC
  Administered 2019-05-26: 1 mg via INTRAMUSCULAR
  Filled 2019-05-26: qty 1

## 2019-05-26 NOTE — Progress Notes (Signed)
Patient's file is currently under review with [facility]. Patient had one on one private caregivers at [facility]. If SNF not able to accommodate patient needs, patient will need to then test negative before returning to [facility] with private caregivers.   [place], [place]

## 2019-05-26 NOTE — Progress Notes (Signed)
Occupational Therapy Treatment Patient Details Name: Melissa Fischer MRN: [id number] DOB: 1934-01-04 Today's Date: 05/26/2019    History of present illness 85 y.o. F with dementia, DM, and COPD who presented from SNF with confusion and slurred speech. CT negative for acute findings. COVID screening positive. CT of head negative for acute stroke.  Pt dx with COVID pneumonitis without acute hypoxic respiratory failure, acute metabolic encepholopathy, hypokalemia, and dental infection.     OT comments  Pt with limitations due to continued weakness, impaired cognition and intermittent agitation (likely due to cognitive deficits). Pt often resistive to therapist movements and often responds "no" or "what for" to therapist request. Slowly egressed bed to chair position during session with additional focus on facilitating head towards midline as pt tending to have cervical flexion towards R side; repositioned to maintain cervical positioning end of session. Pt requiring totalA for all ADL At this time. VSS on RA throughout. Will continue per POC.    Follow Up Recommendations  SNF;Supervision/Assistance - 24 hour    Equipment Recommendations  None recommended by OT          Precautions / Restrictions Precautions Precautions: Fall;Other (comment) Precaution Comments: significant right head rotation and lateral lean, will reach out  and grab anything  close( mask etc)       Mobility Bed Mobility Overal bed mobility: Needs Assistance             General bed mobility comments: parient found in bed  in supine with Head tilted to right and trunk tilted. Bed slowly placed in more chair position and over an extended time, slowly moved the head to more neutral position, did not  gain neutral position but did get closer to midline using pillow against the side of the head. Once achieved the head position, patient maintained close to the position  Transfers                 General transfer  comment: unable to attempt due to patient's resisistance and extension posturing and significant anxiousness with any body movements.     Balance                                           ADL either performed or assessed with clinical judgement   ADL Overall ADL's : Needs assistance/impaired                                       General ADL Comments: currently requires totalA for ADL, largely in part due to impaired cognition and pt resistant to movement or to participating with therapy     Vision   Additional Comments: pt able to track towards midline and make eye contact with therapist given MAX cues to do so; tends to maintain gaze R    Perception     Praxis      Cognition Arousal/Alertness: Awake/alert Behavior During Therapy: Restless;Anxious Overall Cognitive Status: History of cognitive impairments - at baseline Area of Impairment: Orientation;Attention;Memory;Following commands;Safety/judgement;Awareness;Problem solving                     Memory: Decreased recall of precautions;Decreased short-term memory Following Commands: Follows one step commands inconsistently Safety/Judgement: Decreased awareness of safety;Decreased awareness of deficits Awareness: Intellectual   General Comments: pt  has somewhat jargon and non sensible words mostly, Does not realy follow commands, responds with phrases  like" why, what's it for". Generally does not follow any directions nor particpate i n activity offered.         Exercises     Shoulder Instructions       General Comments VSS on RA    Pertinent Vitals/ Pain       Pain Assessment: Faces Faces Pain Scale: Hurts whole lot Pain Location: reaches for Lesft side of neck during attempts to move to reposition the head Pain Descriptors / Indicators: Discomfort;Grimacing;Guarding;Moaning Pain Intervention(s): Monitored during session;Repositioned;Limited activity within patient's  tolerance  Home Living                                          Prior Functioning/Environment              Frequency  Min 2X/week        Progress Toward Goals  OT Goals(current goals can now be found in the care plan section)  Progress towards OT goals: OT to reassess next treatment  Acute Rehab OT Goals Patient Stated Goal: daughter wants her to stay ambulatory per OT notes (OT spoke with daughter) OT Goal Formulation: With family Time For Goal Achievement: 05/29/19 Potential to Achieve Goals: [place] Discharge plan remains appropriate    Co-evaluation    PT/OT/SLP Co-Evaluation/Treatment: Yes Reason for Co-Treatment: For patient/therapist safety;Necessary to address cognition/behavior during functional activity;Complexity of the patient's impairments (multi-system involvement) PT goals addressed during session: Mobility/safety with mobility OT goals addressed during session: ADL's and self-care      AM-PAC OT "6 Clicks" Daily Activity     Outcome Measure   Help from another person eating meals?: Total Help from another person taking care of personal grooming?: Total Help from another person toileting, which includes using toliet, bedpan, or urinal?: Total Help from another person bathing (including washing, rinsing, drying)?: Total Help from another person to put on and taking off regular upper body clothing?: Total Help from another person to put on and taking off regular lower body clothing?: Total 6 Click Score: 6    End of Session    OT Visit Diagnosis: Unsteadiness on feet (R26.81);Other abnormalities of gait and mobility (R26.89);Muscle weakness (generalized) (M62.81);Other symptoms and signs involving cognitive function;Pain Pain - part of body: (generalized)   Activity Tolerance Treatment limited secondary to agitation(limited due to impaired cognition)   Patient Left in bed;with call bell/phone within reach;with bed alarm  set   Nurse Communication Mobility status        Time: 9892-1194 OT Time Calculation (min): 35 min  Charges: OT General Charges $OT Visit: 1 Visit OT Treatments $Self Care/Home Management : 8-22 mins  Lou Cal, OT Supplemental Rehabilitation Services Pager [phone] Office [phone]    Raymondo Band 05/26/2019, 5:11 PM

## 2019-05-26 NOTE — Progress Notes (Signed)
PROGRESS NOTE                                                                                                                                                                                                             Patient Demographics:    Melissa Fischer, is a 85 y.o. female, DOB - 08-09-1934, VFI:[id number]  Outpatient Primary MD for the patient is Lajean Manes, MD    LOS - 5  Admit date - 05/20/2019    Chief Complaint  Patient presents with   Altered Mental Status   Weakness       Brief Narrative Melissa Fischer a 85 y.o.Fwith dementia, DM, and COPD who presented from SNF with confusion and slurred speech. In the ER, repeat CT head (had had one 4 days prior) showed no stroke. COVID screening positive. Not hypoxic, CXR clear.   Subjective:   Patient in bed, appears to be in no distress, pleasantly confused but denies any chest-abd pain, no SOB.   Assessment  & Plan :     1. Toxic encephalopathy due to acute Covid 19 Viral Illness during the ongoing 2020 Covid 19 Pandemic - CT head x2 along with CT C-spine nonacute, stable UA and chest x-ray. She is oxygenating well on room air.  No focal deficits, mentation is stable but still delirious, she has underlying advanced dementia and likely will continue to have delirium while in the hospital setting.  Minimize narcotics and benzodiazepines.  Supportive care, advance activity, will require SNF.  No pulmonary symptoms we will continue to monitor her inflammatory markers and clinically.  COVID-19 Labs  Recent Labs    05/24/19 0400 05/25/19 0324 05/26/19 0330  DDIMER 1.51* 1.67* 1.30*  FERRITIN 293 303 267  CRP 5.9* 5.1* 9.2*    Lab Results  Component Value Date   SARSCOV2NAA POSITIVE (A) 05/20/2019     2.  Recent history of dental infection.  Maxillofacial CT nonacute, on clindamycin continue, outpatient dental follow-up.  3.  Advanced dementia.  At risk for  delirium, minimize narcotics and benzodiazepines, did not tolerate Exelon, Namenda or Aricept in the past.  Supportive care.  4.  COPD.  Stable supportive care.  5.  Dehydration.  Hydrated will continue IV fluids at a slow rate for another 24 hours as she has poor oral intake now.  6.  Severe  hypokalemia and hypomagnesemia.  Both replaced and stable.  7. DM type II.  On sliding scale continue to monitor.  Lab Results  Component Value Date   HGBA1C 7.9 (H) 01/26/2017   CBG (last 3)  Recent Labs    05/25/19 1126 05/25/19 1706 05/25/19 2042  GLUCAP 318* 148* 192*      Condition - Fair   Family Communication  : Updated daughter in detail on 05/24/2019 and 05/26/2019, DNR.  Code Status : DNR  Diet : Soft dysphagia 1 diet  Disposition Plan  :  SNF when bed available, updated daughter again on 05/26/2019.  Consults  : None  Procedures  :    CT head x2 along with CT C-spine and maxillofacial CT.  Nonacute.  CT abdomen pelvis. Diverticulosis without diverticulitis. No acute abnormality noted   DVT Prophylaxis  :  Lovenox added  Lab Results  Component Value Date   PLT 438 (H) 05/26/2019    Inpatient Medications  Scheduled Meds:  chlorhexidine  15 mL Mouth Rinse BID   enoxaparin (LOVENOX) injection  40 mg Subcutaneous Q24H   feeding supplement (ENSURE ENLIVE)  237 mL Oral TID BM   insulin aspart  0-5 Units Subcutaneous QHS   insulin aspart  0-9 Units Subcutaneous TID WC   mouth rinse  15 mL Mouth Rinse q12n4p   mometasone-formoterol  2 puff Inhalation BID   sodium chloride flush  3 mL Intravenous Q12H   Continuous Infusions:  sodium chloride Stopped (05/25/19 1134)   clindamycin (CLEOCIN) IV 600 mg (05/26/19 0500)   dextrose 5 % with kcl 50 mL/hr at 05/26/19 0938   PRN Meds:.sodium chloride, acetaminophen **OR** acetaminophen, albuterol, antiseptic oral rinse, lip balm, LORazepam, [DISCONTINUED] ondansetron **OR** ondansetron (ZOFRAN) IV,  senna-docusate  Antibiotics  :    Anti-infectives (From admission, onward)   Start     Dose/Rate Route Frequency Ordered Stop   05/21/19 1400  clindamycin (CLEOCIN) IVPB 600 mg     600 mg 100 mL/hr over 30 Minutes Intravenous Every 8 hours 05/21/19 1212         Time Spent in minutes  30   Lala Lund M.D on 05/26/2019 at 11:06 AM  To page go to [email or web address] - password [email or web address]  [facility] -  Office  [phone]  See all Orders from today for further details    Objective:   Vitals:   05/25/19 2000 05/26/19 0036 05/26/19 0439 05/26/19 0700  BP: (!) 170/91 (!) 159/77 (!) 165/69 (!) 146/91  Pulse: 98 94 97 88  Resp: 17 13 14    Temp: 98.9 F (37.2 C) 98 F (36.7 C) 99.3 F (37.4 C) 99.2 F (37.3 C)  TempSrc: Oral Axillary Axillary Oral  SpO2: 100% 100% 100% 100%  Weight:      Height:        Wt Readings from Last 3 Encounters:  05/20/19 63.2 kg  12/06/18 70.6 kg  12/02/17 74.4 kg     Intake/Output Summary (Last 24 hours) at 05/26/2019 1106 Last data filed at 05/26/2019 1100 Gross per 24 hour  Intake 424.24 ml  Output 850 ml  Net -425.76 ml     Physical Exam  Awake but pleasantly confused, no focal deficits,  .AT,PERRAL Supple Neck,No JVD, No cervical lymphadenopathy appriciated.  Symmetrical Chest wall movement, Good air movement bilaterally, CTAB RRR,No Gallops, Rubs or new Murmurs, No Parasternal Heave +ve B.Sounds, Abd Soft, No tenderness, No organomegaly appriciated, No rebound - guarding or rigidity. No Cyanosis, Clubbing or edema, No  new Rash or bruise    Data Review:    CBC Recent Labs  Lab 05/20/19 1615 05/21/19 0030 05/22/19 0501 05/23/19 0400 05/24/19 0400 05/25/19 0324 05/26/19 0330  WBC 11.4* 11.0* 8.3 10.4 12.6* 14.2* 17.3*  HGB 12.8 12.7 12.6 12.0 11.4* 12.2 12.0  HCT 38.8 36.8   36.0 37.4 36.0 34.7* 36.5 34.2*  PLT 393 358 390 400 387 443* 438*  MCV 90.7 86.6 89.3 90.5 89.2 87.7 87.2  MCH 29.9 29.9 30.1 30.2 29.3  29.3 30.6  MCHC 33.0 34.5 33.7 33.3 32.9 33.4 35.1  RDW 12.0 11.9 12.0 12.1 11.9 12.0 12.1  LYMPHSABS 2.4 2.6  --   --   --  3.0 2.4  MONOABS 1.0 1.2*  --   --   --  0.8 1.4*  EOSABS 0.1 0.1  --   --   --  0.2 0.2  BASOSABS 0.0 0.1  --   --   --  0.1 0.1    Chemistries  Recent Labs  Lab 05/20/19 1615  05/22/19 0501 05/23/19 0400 05/24/19 0400 05/25/19 0324 05/26/19 0330  NA 144   < > 141 141 138 135 133*  K 3.6   < > 3.3* 3.3* 3.5 2.8* 3.5  CL 107   < > 103 104 105 102 100  CO2 25   < > 26 25 20* 21* 23  GLUCOSE 231*   < > 172* 149* 148* 133* 226*  BUN 28*   < > 18 21 14 12  7*  CREATININE 0.90   < > 0.78 0.76 0.67 0.60 0.55  CALCIUM 10.1   < > 9.0 8.8* 8.2* 8.0* 8.2*  MG  --   --   --   --   --  1.6* 2.0  AST 14*  --   --   --   --  21 18  ALT 14  --   --   --   --  16 14  ALKPHOS 83  --   --   --   --  69 82  BILITOT 0.6  --   --   --   --  0.6 0.5   < > = values in this interval not displayed.   ------------------------------------------------------------------------------------------------------------------ No results for input(s): CHOL, HDL, LDLCALC, TRIG, CHOLHDL, LDLDIRECT in the last 72 hours.  Lab Results  Component Value Date   HGBA1C 7.9 (H) 01/26/2017   ------------------------------------------------------------------------------------------------------------------ No results for input(s): TSH, T4TOTAL, T3FREE, THYROIDAB in the last 72 hours.  Invalid input(s): FREET3  Cardiac Enzymes No results for input(s): CKMB, TROPONINI, MYOGLOBIN in the last 168 hours.  Invalid input(s): CK ------------------------------------------------------------------------------------------------------------------ No results found for: BNP  Micro Results Recent Results (from the past 240 hour(s))  Blood Cultures (routine x 2)     Status: None   Collection Time: 05/20/19  3:07 PM  Result Value Ref Range Status   Specimen Description BLOOD LEFT ANTECUBITAL  Final    Special Requests   Final    BOTTLES DRAWN AEROBIC AND ANAEROBIC Blood Culture adequate volume   Culture   Final    NO GROWTH 5 DAYS Performed at [facility], [address], [place], [place] [address]    Report Status 05/25/2019 FINAL  Final  Blood Cultures (routine x 2)     Status: None   Collection Time: 05/20/19  5:30 PM  Result Value Ref Range Status   Specimen Description BLOOD RIGHT WRIST  Final   Special Requests  Final    BOTTLES DRAWN AEROBIC ONLY Blood Culture results may not be optimal due to an inadequate volume of blood received in culture bottles   Culture   Final    NO GROWTH 5 DAYS Performed at [facility], [place] [address], [place], [place] [address]    Report Status 05/25/2019 FINAL  Final  Urine culture     Status: None   Collection Time: 05/20/19  9:32 PM  Result Value Ref Range Status   Specimen Description URINE, CATHETERIZED  Final   Special Requests NONE  Final   Culture   Final    NO GROWTH Performed at [facility], [place] [address] [place], [place] [address]    Report Status 05/24/2019 FINAL  Final  SARS Coronavirus 2 (CEPHEID- Performed in [place] [facility] lab), Hosp Order     Status: Abnormal   Collection Time: 05/20/19 11:01 PM  Result Value Ref Range Status   SARS Coronavirus 2 POSITIVE (A) NEGATIVE Final    Comment: RESULT CALLED TO, READ BACK BY AND VERIFIED WITH: C. THOMPSON,CHARGE RN 0031 05/21/2019 T. TYSOR (NOTE) If result is NEGATIVE SARS-CoV-2 target nucleic acids are NOT DETECTED. The SARS-CoV-2 RNA is generally detectable in upper and lower  respiratory specimens during the acute phase of infection. The lowest  concentration of SARS-CoV-2 viral copies this assay can detect is 250  copies / mL. A negative result does not preclude SARS-CoV-2 infection  and should not be used as the sole basis for treatment or other  patient management decisions.  A negative result may occur with  improper specimen collection /  handling, submission of specimen other  than nasopharyngeal swab, presence of viral mutation(s) within the  areas targeted by this assay, and inadequate number of viral copies  (<250 copies / mL). A negative result must be combined with clinical  observations, patient history, and epidemiological information. If result is POSITIVE SARS-CoV-2 target nucleic acids are D ETECTED. The SARS-CoV-2 RNA is generally detectable in upper and lower  respiratory specimens during the acute phase of infection.  Positive  results are indicative of active infection with SARS-CoV-2.  Clinical  correlation with patient history and other diagnostic information is  necessary to determine patient infection status.  Positive results do  not rule out bacterial infection or co-infection with other viruses. If result is PRESUMPTIVE POSTIVE SARS-CoV-2 nucleic acids MAY BE PRESENT.   A presumptive positive result was obtained on the submitted specimen  and confirmed on repeat testing.  While 2019 novel coronavirus  (SARS-CoV-2) nucleic acids may be present in the submitted sample  additional confirmatory testing may be necessary for epidemiological  and / or clinical management purposes  to differentiate between  SARS-CoV-2 and other Sarbecovirus currently known to infect humans.  If clinically indicated additional testing with an alternate test  methodology [phone]) is advised. The SARS-CoV-2 RNA is generally  detectable in upper and lower respiratory specimens during the acute  phase of infection. The expected result is Negative. Fact Sheet for Patients:  StrictlyIdeas.no Fact Sheet for Healthcare Providers: BankingDealers.co.za This test is not yet approved or cleared by the [place] FDA and has been authorized for detection and/or diagnosis of SARS-CoV-2 by FDA under an Emergency Use Authorization (EUA).  This EUA will remain in effect (meaning this  test can be used) for the duration of the COVID-19 declaration under Section 564(b)(1) of the Act, 21 U.S.C. section 360bbb-3(b)(1), unless the authorization is terminated or revoked  sooner. Performed at [facility], [place] [address], [place], [place] [address]   MRSA PCR Screening     Status: None   Collection Time: 05/21/19 12:52 AM  Result Value Ref Range Status   MRSA by PCR NEGATIVE NEGATIVE Final    Comment:        The GeneXpert MRSA Assay (FDA approved for NASAL specimens only), is one component of a comprehensive MRSA colonization surveillance program. It is not intended to diagnose MRSA infection nor to guide or monitor treatment for MRSA infections. Performed at [facility], [place] [address] [place], [place] [address]     Radiology Reports Dg Chest 1 View  Result Date: 05/08/2019 CLINICAL DATA:  Unwitnessed fall. EXAM: CHEST  1 VIEW COMPARISON:  Radiographs of January 31, 2017. FINDINGS: The heart size and mediastinal contours are within normal limits. Both lungs are clear. No pneumothorax or pleural effusion is noted. Left-sided pacemaker is unchanged in position. The visualized skeletal structures are unremarkable. IMPRESSION: No active disease. Electronically Signed   By: Marijo Conception M.D.   On: 05/08/2019 14:20   Ct Head Wo Contrast  Result Date: 05/21/2019 CLINICAL DATA:  Altered mental status, right-sided weakness, and slurred speech. EXAM: CT HEAD WITHOUT CONTRAST TECHNIQUE: Contiguous axial images were obtained from the base of the skull through the vertex without intravenous contrast. COMPARISON:  05/20/2019 FINDINGS: Brain: There is no evidence of acute infarct, intracranial hemorrhage, mass, midline shift, or extra-axial fluid collection. Patchy to confluent cerebral white matter hypodensities are unchanged and nonspecific but compatible with moderate to severe chronic small vessel ischemic disease. Moderate to severe cerebral atrophy is again noted.  Vascular: Calcified atherosclerosis at the skull base. No hyperdense vessel. Skull: No acute fracture or suspicious osseous lesion. Chronic deformity of the right zygomatic arch. Sinuses/Orbits: Paranasal sinuses and mastoid air cells are clear. Bilateral cataract extraction. Other: None. IMPRESSION: 1. No evidence of acute intracranial abnormality. 2. Moderate to severe chronic small vessel ischemic disease and cerebral atrophy. Electronically Signed   By: Logan Bores M.D.   On: 05/21/2019 14:47   Ct Head Wo Contrast  Result Date: 05/20/2019 CLINICAL DATA:  Fall neck pain and altered mental status EXAM: CT HEAD WITHOUT CONTRAST CT CERVICAL SPINE WITHOUT CONTRAST TECHNIQUE: Multidetector CT imaging of the head and cervical spine was performed following the standard protocol without intravenous contrast. Multiplanar CT image reconstructions of the cervical spine were also generated. COMPARISON:  CT head and cervical spine 05/08/2019 FINDINGS: CT HEAD FINDINGS Brain: There is no mass, hemorrhage or extra-axial collection. There is generalized atrophy without lobar predilection. There is hypoattenuation of the periventricular white matter, most commonly indicating chronic ischemic microangiopathy. Vascular: No abnormal hyperdensity of the major intracranial arteries or dural venous sinuses. No intracranial atherosclerosis. Skull: The visualized skull base, calvarium and extracranial soft tissues are normal. Sinuses/Orbits: No fluid levels or advanced mucosal thickening of the visualized paranasal sinuses. No mastoid or middle ear effusion. The orbits are normal. CT CERVICAL SPINE FINDINGS Alignment: Grade 1 anterolisthesis at C4-5. Skull base and vertebrae: No acute fracture. Soft tissues and spinal canal: No prevertebral fluid or swelling. No visible canal hematoma. Disc levels: Multilevel moderate-to-severe facet hypertrophy. Upper chest: No pneumothorax, pulmonary nodule or pleural effusion. Other: Normal  visualized paraspinal cervical soft tissues. IMPRESSION: 1. No acute abnormality of the head or cervical spine. 2. Chronic ischemic microangiopathy and generalized volume loss. 3. Multilevel cervical facet arthrosis with grade 1 anterolisthesis at C4-5, unchanged. Electronically Signed  By: Ulyses Jarred M.D.   On: 05/20/2019 20:30   Ct Head Wo Contrast  Result Date: 05/08/2019 CLINICAL DATA:  85 year old female with head and neck injury following fall. EXAM: CT HEAD WITHOUT CONTRAST CT CERVICAL SPINE WITHOUT CONTRAST TECHNIQUE: Multidetector CT imaging of the head and cervical spine was performed following the standard protocol without intravenous contrast. Multiplanar CT image reconstructions of the cervical spine were also generated. COMPARISON:  07/16/2018 and prior CTs FINDINGS: CT HEAD FINDINGS Brain: No evidence of acute infarction, hemorrhage, hydrocephalus, extra-axial collection or mass lesion/mass effect. Atrophy and chronic small-vessel white matter ischemic changes again noted. Vascular: Carotid atherosclerotic calcifications again noted. Skull: No acute abnormality or suspicious focal lesion. A remote RIGHT zygoma fracture is noted. Sinuses/Orbits: No acute finding. Other: None. CT CERVICAL SPINE FINDINGS Alignment: Normal. Skull base and vertebrae: No acute fracture. No primary bone lesion or focal pathologic process. Soft tissues and spinal canal: No prevertebral fluid or swelling. No visible canal hematoma. Disc levels: Mild moderate multilevel degenerative disc disease/spondylosis noted, greatest at C5-6. Moderate multilevel facet arthropathy identified. Upper chest: No acute abnormality Other: None IMPRESSION: 1. No evidence of acute intracranial abnormality. Atrophy and chronic small-vessel white matter ischemic changes. 2. No static evidence of acute injury to the cervical spine. Multilevel degenerative changes. Electronically Signed   By: Margarette Canada M.D.   On: 05/08/2019 14:37   Ct  Cervical Spine Wo Contrast  Result Date: 05/20/2019 CLINICAL DATA:  Fall neck pain and altered mental status EXAM: CT HEAD WITHOUT CONTRAST CT CERVICAL SPINE WITHOUT CONTRAST TECHNIQUE: Multidetector CT imaging of the head and cervical spine was performed following the standard protocol without intravenous contrast. Multiplanar CT image reconstructions of the cervical spine were also generated. COMPARISON:  CT head and cervical spine 05/08/2019 FINDINGS: CT HEAD FINDINGS Brain: There is no mass, hemorrhage or extra-axial collection. There is generalized atrophy without lobar predilection. There is hypoattenuation of the periventricular white matter, most commonly indicating chronic ischemic microangiopathy. Vascular: No abnormal hyperdensity of the major intracranial arteries or dural venous sinuses. No intracranial atherosclerosis. Skull: The visualized skull base, calvarium and extracranial soft tissues are normal. Sinuses/Orbits: No fluid levels or advanced mucosal thickening of the visualized paranasal sinuses. No mastoid or middle ear effusion. The orbits are normal. CT CERVICAL SPINE FINDINGS Alignment: Grade 1 anterolisthesis at C4-5. Skull base and vertebrae: No acute fracture. Soft tissues and spinal canal: No prevertebral fluid or swelling. No visible canal hematoma. Disc levels: Multilevel moderate-to-severe facet hypertrophy. Upper chest: No pneumothorax, pulmonary nodule or pleural effusion. Other: Normal visualized paraspinal cervical soft tissues. IMPRESSION: 1. No acute abnormality of the head or cervical spine. 2. Chronic ischemic microangiopathy and generalized volume loss. 3. Multilevel cervical facet arthrosis with grade 1 anterolisthesis at C4-5, unchanged. Electronically Signed   By: Ulyses Jarred M.D.   On: 05/20/2019 20:30   Ct Cervical Spine Wo Contrast  Result Date: 05/08/2019 CLINICAL DATA:  85 year old female with head and neck injury following fall. EXAM: CT HEAD WITHOUT CONTRAST  CT CERVICAL SPINE WITHOUT CONTRAST TECHNIQUE: Multidetector CT imaging of the head and cervical spine was performed following the standard protocol without intravenous contrast. Multiplanar CT image reconstructions of the cervical spine were also generated. COMPARISON:  07/16/2018 and prior CTs FINDINGS: CT HEAD FINDINGS Brain: No evidence of acute infarction, hemorrhage, hydrocephalus, extra-axial collection or mass lesion/mass effect. Atrophy and chronic small-vessel white matter ischemic changes again noted. Vascular: Carotid atherosclerotic calcifications again noted. Skull: No acute abnormality or suspicious focal  lesion. A remote RIGHT zygoma fracture is noted. Sinuses/Orbits: No acute finding. Other: None. CT CERVICAL SPINE FINDINGS Alignment: Normal. Skull base and vertebrae: No acute fracture. No primary bone lesion or focal pathologic process. Soft tissues and spinal canal: No prevertebral fluid or swelling. No visible canal hematoma. Disc levels: Mild moderate multilevel degenerative disc disease/spondylosis noted, greatest at C5-6. Moderate multilevel facet arthropathy identified. Upper chest: No acute abnormality Other: None IMPRESSION: 1. No evidence of acute intracranial abnormality. Atrophy and chronic small-vessel white matter ischemic changes. 2. No static evidence of acute injury to the cervical spine. Multilevel degenerative changes. Electronically Signed   By: Margarette Canada M.D.   On: 05/08/2019 14:37   Ct Abdomen Pelvis W Contrast  Result Date: 05/20/2019 CLINICAL DATA:  Fall several days ago with persistent weakness and abdominal pain EXAM: CT ABDOMEN AND PELVIS WITH CONTRAST TECHNIQUE: Multidetector CT imaging of the abdomen and pelvis was performed using the standard protocol following bolus administration of intravenous contrast. CONTRAST:  110mL OMNIPAQUE IOHEXOL 300 MG/ML  SOLN COMPARISON:  None. FINDINGS: Lower chest: No acute abnormality. Hepatobiliary: No focal liver abnormality is  seen. No gallstones, gallbladder wall thickening, or biliary dilatation. Pancreas: Unremarkable. No pancreatic ductal dilatation or surrounding inflammatory changes. Spleen: Normal in size without focal abnormality. Adrenals/Urinary Tract: Adrenal glands are within normal limits. The kidneys are well visualized bilaterally. No renal calculi or obstructive changes are seen. Normal excretion is noted bilaterally. Stomach/Bowel: Mild diverticular changes noted. There are changes suggestive of prior appendectomy. Clinical correlation is recommended. No inflammatory changes are seen. The stomach and small bowel appear within normal limits. Vascular/Lymphatic: Aortic atherosclerosis. No enlarged abdominal or pelvic lymph nodes. Reproductive: Status post hysterectomy. No adnexal masses. Other: No abdominal wall hernia or abnormality. No abdominopelvic ascites. Musculoskeletal: Postsurgical changes are noted in the right hip. No acute bony abnormality is noted. IMPRESSION: Diverticulosis without diverticulitis. No acute abnormality noted. Electronically Signed   By: Inez Catalina M.D.   On: 05/20/2019 20:29   Ct Maxillofacial W Contrast  Result Date: 05/21/2019 CLINICAL DATA:  Oral cavity pain with bleeding and foul odor. EXAM: CT MAXILLOFACIAL WITH CONTRAST TECHNIQUE: Multidetector CT imaging of the maxillofacial structures was performed with intravenous contrast. Multiplanar CT image reconstructions were also generated. CONTRAST:  43mL OMNIPAQUE IOHEXOL 300 MG/ML  SOLN COMPARISON:  None. FINDINGS: The study is mildly motion degraded. Osseous: No acute fracture is identified within limitations of motion artifact. No destructive osseous lesion. No mandibular dislocation. Orbits: Bilateral cataract extraction. No acute inflammatory or traumatic findings. Sinuses: Paranasal sinuses and mastoid air cells are clear. Soft tissues: No oral cavity or pharyngeal mass identified within limitations of dental streak artifact. No  peritonsillar or retropharyngeal fluid collection. No parotid or submandibular space inflammation. Calcified atherosclerosis at the carotid bifurcations without evidence of significant stenosis. Limited intracranial: The brain is more fully evaluated on separate dedicated head CT. No abnormal intracranial enhancement is identified. IMPRESSION: No acute abnormality identified. Electronically Signed   By: Logan Bores M.D.   On: 05/21/2019 14:43   Dg Chest Port 1 View  Result Date: 05/20/2019 CLINICAL DATA:  Altered mental status. EXAM: PORTABLE CHEST 1 VIEW COMPARISON:  Radiograph of May 08, 2019. FINDINGS: The heart size and mediastinal contours are within normal limits. Both lungs are clear. No pneumothorax or pleural effusion is noted. Left-sided pacemaker is unchanged in position. The visualized skeletal structures are unremarkable. IMPRESSION: No active disease. Electronically Signed   By: Marijo Conception M.D.   On:  05/20/2019 16:46   Dg Hips Bilat W Or Wo Pelvis 2 Views  Result Date: 05/08/2019 CLINICAL DATA:  Unwitnessed fall. EXAM: DG HIP (WITH OR WITHOUT PELVIS) 2V BILAT COMPARISON:  Radiographs of July 16, 2018. FINDINGS: There is no evidence of acute hip fracture or dislocation. There is no evidence of arthropathy. Status post surgical repair of old right proximal femoral fracture. IMPRESSION: No acute abnormality seen involving either hip. Electronically Signed   By: Marijo Conception M.D.   On: 05/08/2019 14:22

## 2019-05-26 NOTE — Progress Notes (Signed)
  Speech Language Pathology Treatment: Dysphagia  Patient Details Name: Melissa Fischer MRN: [id number] DOB: 22-Mar-1934 Today's Date: 05/26/2019 Time: 0109-3235 SLP Time Calculation (min) (ACUTE ONLY): 23 min  Assessment / Plan / Recommendation Clinical Impression  Pt more pleasant and participatory today, in that she was amenable to drinking liquids and drank three oz of water.  She declined any solid POs.  Condition of oral mucosa is much better.  Pt appears to not have any mechanical dysphagia - primary barrier to PO intake remains cognition/dementia.  Protecting airway well with liquids.   Facetimed with daughter, Melissa Fischer, and pt smiled and interacted. Continue to offer POs as often as possible. SLP will follow.  HPI HPI: 85 y.o. female with PMH of Alzheimers dementia with behavioral disturbance, type 2 diabetes mellitus, COPD, and history of mild renal insufficiency; admitted on 05/20/2019, presented with complaint of confusion and slurred speech, was found to have failure to thrive and severe dehydration.  Covid 19 (+); transferred to [facility] 5/24.  Pt with acute metabolic encephalopathy, CT negative for acute event.       SLP Plan  Continue with current plan of care       Recommendations  Diet recommendations: Dysphagia 1 (puree);Thin liquid Liquids provided via: Straw Medication Administration: Whole meds with puree Supervision: Staff to assist with self feeding;Full supervision/cueing for compensatory strategies Compensations: Minimize environmental distractions Postural Changes and/or Swallow Maneuvers: Seated upright 90 degrees                Oral Care Recommendations: Oral care BID Follow up Recommendations: 24 hour supervision/assistance SLP Visit Diagnosis: Dysphagia, unspecified (R13.10) Plan: Continue with current plan of care       GO                Melissa Fischer 05/26/2019, 10:52 AM  Estill Bamberg L. Tivis Ringer, [place] Office  number [phone] Pager [phone]

## 2019-05-26 NOTE — Progress Notes (Signed)
Patient refused medicine and P.O intake at this time. Will try again later.

## 2019-05-26 NOTE — Progress Notes (Signed)
Notified MD pts IV was pulled out by pt. unsuccessful attempts x 2 and pt becomes combative when trying to place IV. Also pt has refused meals and drink all day, only taking a few bites at each meal.

## 2019-05-26 NOTE — Progress Notes (Signed)
Patient refused fluid intake last night and this morning. IV fluids was d/c'd at 12am. M.D. oncall informed. M.D oncall verbal order same IV fluids. Will continue to monitor.

## 2019-05-26 NOTE — Progress Notes (Signed)
Physical Therapy Treatment Patient Details Name: Melissa Fischer MRN: [id number] DOB: 07/17/1934 Today's Date: 05/26/2019    History of Present Illness 85 y.o. F with dementia, DM, and COPD who presented from SNF with confusion and slurred speech. CT of head and neck. negative for acute findings. COVID screening positive. CT of head negative for acute stroke.  Pt dx with COVID pneumonitis without acute hypoxic respiratory failure, acute metabolic encepholopathy, hypokalemia, and dental infection.    Patient had a fall 5/10 and sent to [facility]. By report, progressive right side weakness and slurred speech.    PT Comments    The patient is difficult to mobilize, resistive and hyper reflexive with any body movements. Head remains tilted and rotated to the right. Worked with patient in more upright bed position and Neck ROM. Continue PT. Patient is not safe to place in a recliner at this time as pt. Most likely could not tolerate the  mechanical lift process and is not able to participate.  Follow Up Recommendations  SNF     Equipment Recommendations  None recommended by PT    Recommendations for Other Services       Precautions / Restrictions Precautions Precautions: Fall;Other (comment) Precaution Comments: significant right head rotation and lateral lean, will reach out  and grab anything  close( mask etc)    Mobility  Bed Mobility Overal bed mobility: Needs Assistance             General bed mobility comments: parient found in bed  in supine with Head tilted to right and trunk tilted. Bed slowly placed in more chair position and over an extended time, slowly moved the head to more neutral position, did not  gain neutral position but did get closer to midline using pillow against the side of the head. Once achieved the head position, patient maintained close to the position  Transfers                 General transfer comment: unable to attempt due to patient's resisistance and  extension posturing and significant anxiousness with any body movements.   Ambulation/Gait                 Stairs             Wheelchair Mobility    Modified Rankin (Stroke Patients Only)       Balance                                            Cognition Arousal/Alertness: Awake/alert Behavior During Therapy: Restless;Anxious   Area of Impairment: Orientation;Attention;Memory;Following commands;Safety/judgement;Awareness;Problem solving                     Memory: Decreased recall of precautions;Decreased short-term memory Following Commands: Follows one step commands inconsistently Safety/Judgement: Decreased awareness of safety;Decreased awareness of deficits Awareness: Intellectual   General Comments: pt  has somewhat jargon and non sensible words mostly, Does not realy follow commands, responds with phrases  like" why, what's it for". Generally does not follow any directions nor particpate i n activity offered.       Exercises      General Comments        Pertinent Vitals/Pain Faces Pain Scale: Hurts whole lot Pain Location: reaches for Lesft side of neck during attempts to move to reposition the head Pain Descriptors / Indicators:  Discomfort;Grimacing;Guarding;Moaning Pain Intervention(s): Limited activity within patient's tolerance;Monitored during session;Repositioned;Heat applied    Home Living                      Prior Function            PT Goals (current goals can now be found in the care plan section) Progress towards PT goals: Progressing toward goals    Frequency    Min 3X/week(cosider 2 x if not progressing)      PT Plan Current plan remains appropriate    Co-evaluation PT/OT/SLP Co-Evaluation/Treatment: Yes Reason for Co-Treatment: Complexity of the patient's impairments (multi-system involvement) PT goals addressed during session: Mobility/safety with mobility        AM-PAC PT  "6 Clicks" Mobility   Outcome Measure  Help needed turning from your back to your side while in a flat bed without using bedrails?: Total Help needed moving from lying on your back to sitting on the side of a flat bed without using bedrails?: Total Help needed moving to and from a bed to a chair (including a wheelchair)?: Total Help needed standing up from a chair using your arms (e.g., wheelchair or bedside chair)?: Total Help needed to walk in hospital room?: Total Help needed climbing 3-5 steps with a railing? : Total 6 Click Score: 6    End of Session   Activity Tolerance: Patient tolerated treatment well Patient left: in bed;with bed alarm set Nurse Communication: Mobility status PT Visit Diagnosis: Muscle weakness (generalized) (M62.81);Difficulty in walking, not elsewhere classified (R26.2)     Time: 3500-9381 PT Time Calculation (min) (ACUTE ONLY): 35 min  Charges:  $Therapeutic Activity: 8-22 mins                     [name] PT Acute Rehabilitation Services Pager [phone] Office [phone]    [name] 05/26/2019, 4:04 PM

## 2019-05-27 LAB — COMPREHENSIVE METABOLIC PANEL
ALT: 14 U/L (ref 0–44)
AST: 18 U/L (ref 15–41)
Albumin: 2.7 g/dL — ABNORMAL LOW (ref 3.5–5.0)
Alkaline Phosphatase: 83 U/L (ref 38–126)
Anion gap: 9 (ref 5–15)
BUN: 7 mg/dL — ABNORMAL LOW (ref 8–23)
CO2: 22 mmol/L (ref 22–32)
Calcium: 8 mg/dL — ABNORMAL LOW (ref 8.9–10.3)
Chloride: 104 mmol/L (ref 98–111)
Creatinine, Ser: 0.61 mg/dL (ref 0.44–1.00)
GFR calc Af Amer: >60 mL/min (ref >60)
GFR calc non Af Amer: >60 mL/min (ref >60)
Glucose, Bld: 76 mg/dL (ref 70–99)
Potassium: 3.3 mmol/L — ABNORMAL LOW (ref 3.5–5.1)
Sodium: 135 mmol/L (ref 135–145)
Total Bilirubin: 0.6 mg/dL (ref 0.3–1.2)
Total Protein: 5.7 g/dL — ABNORMAL LOW (ref 6.5–8.1)

## 2019-05-27 LAB — CBC WITH DIFFERENTIAL/PLATELET
Abs Immature Granulocytes: 0.28 10*3/uL — ABNORMAL HIGH (ref 0.00–0.07)
Basophils Absolute: 0.1 10*3/uL (ref 0.0–0.1)
Basophils Relative: 0 %
Eosinophils Absolute: 0.1 10*3/uL (ref 0.0–0.5)
Eosinophils Relative: 0 %
HCT: 31.3 % — ABNORMAL LOW (ref 36.0–46.0)
Hemoglobin: 10.9 g/dL — ABNORMAL LOW (ref 12.0–15.0)
Immature Granulocytes: 2 %
Lymphocytes Relative: 13 %
Lymphs Abs: 2.3 10*3/uL (ref 0.7–4.0)
MCH: 30.4 pg (ref 26.0–34.0)
MCHC: 34.8 g/dL (ref 30.0–36.0)
MCV: 87.2 fL (ref 80.0–100.0)
Monocytes Absolute: 1.3 10*3/uL — ABNORMAL HIGH (ref 0.1–1.0)
Monocytes Relative: 8 %
Neutro Abs: 13.4 10*3/uL — ABNORMAL HIGH (ref 1.7–7.7)
Neutrophils Relative %: 77 %
Platelets: 413 10*3/uL — ABNORMAL HIGH (ref 150–400)
RBC: 3.59 MIL/uL — ABNORMAL LOW (ref 3.87–5.11)
RDW: 12.3 % (ref 11.5–15.5)
WBC: 17.4 10*3/uL — ABNORMAL HIGH (ref 4.0–10.5)
nRBC: 0 % (ref 0.0–0.2)

## 2019-05-27 LAB — MAGNESIUM: Magnesium: 1.9 mg/dL (ref 1.7–2.4)

## 2019-05-27 LAB — GLUCOSE, CAPILLARY
Glucose-Capillary: 142 mg/dL — ABNORMAL HIGH (ref 70–99)
Glucose-Capillary: 146 mg/dL — ABNORMAL HIGH (ref 70–99)
Glucose-Capillary: 193 mg/dL — ABNORMAL HIGH (ref 70–99)
Glucose-Capillary: 341 mg/dL — ABNORMAL HIGH (ref 70–99)
Glucose-Capillary: 275 mg/dL — ABNORMAL HIGH (ref 70–99)

## 2019-05-27 LAB — C-REACTIVE PROTEIN: CRP: 16.5 mg/dL — ABNORMAL HIGH (ref <1.0)

## 2019-05-27 LAB — FERRITIN: Ferritin: 258 ng/mL (ref 11–307)

## 2019-05-27 LAB — D-DIMER, QUANTITATIVE: D-Dimer, Quant: 1.87 ug/mL-FEU — ABNORMAL HIGH (ref 0.00–0.50)

## 2019-05-27 MED ORDER — QUETIAPINE FUMARATE 25 MG PO TABS: 12.5000 mg | ORAL_TABLET | Freq: Every day | ORAL | Status: AC

## 2019-05-27 MED ORDER — METHYLPREDNISOLONE SODIUM SUCC 125 MG IJ SOLR
60.0000 mg | Freq: Two times a day (BID) | INTRAMUSCULAR | Status: DC
  Administered 2019-05-27 (×2): 60 mg via INTRAVENOUS
  Filled 2019-05-27 (×2): qty 2

## 2019-05-27 NOTE — Progress Notes (Signed)
0820  Resting in bed.  O2 sat 98% on room air.  Confused.  Oriented to self only.

## 2019-05-27 NOTE — Discharge Instructions (Signed)
Follow with Primary MD Lajean Manes, MD in 7 days   Get CBC, CMP, 2 view Chest X ray -  checked  by Primary MD or SNF MD in 5-7 days   Activity: As tolerated with Full fall precautions use walker/cane & assistance as needed  Disposition SNF  Diet: Dysphagia 1 diet with feeding assistance and aspiration precautions.    Special Instructions: If you have smoked or chewed Tobacco  in the last 2 yrs please stop smoking, stop any regular Alcohol  and or any Recreational drug use.  On your next visit with your primary care physician please Get Medicines reviewed and adjusted.  Please request your Prim.MD to go over all Hospital Tests and Procedure/Radiological results at the follow up, please get all Hospital records sent to your Prim MD by signing hospital release before you go home.  If you experience worsening of your admission symptoms, develop shortness of breath, life threatening emergency, suicidal or homicidal thoughts you must seek medical attention immediately by calling 911 or calling your MD immediately  if symptoms less severe.  You Must read complete instructions/literature along with all the possible adverse reactions/side effects for all the Medicines you take and that have been prescribed to you. Take any new Medicines after you have completely understood and accpet all the possible adverse reactions/side effects.      Person Under Monitoring Name: Melissa Fischer  Location: [place] [address]   CORONAVIRUS DISEASE 2019 (COVID-19) Guidance for Persons Under Investigation You are being tested for the virus that causes coronavirus disease 2019 (COVID-19). Public health actions are necessary to ensure protection of your health and the health of others, and to prevent further spread of infection. COVID-19 is caused by a virus that can cause symptoms, such as fever, cough, and shortness of breath. The primary transmission from person to person is by coughing or  sneezing. On January 27, 2019, the [place] announced a [employer] Emergency of International Concern and on January 28, 2019 the U.S. Department of Health and Human Services declared a public health emergency. If the virus that causesCOVID-19 spreads in the community, it could have severe public health consequences.  As a person under investigation for COVID-19, the [facility] advises you to adhere to the following guidance until your test results are reported to you. If your test result is positive, you will receive additional information from your provider and your local health department at that time.   Remain at home until you are cleared by your health provider or public health authorities.   Keep a log of visitors to your home using the form provided. Any visitors to your home must be aware of your isolation status.  If you plan to move to a new address or leave the county, notify the local health department in your county.  Call a doctor or seek care if you have an urgent medical need. Before seeking medical care, call ahead and get instructions from the provider before arriving at the medical office, clinic or hospital. Notify them that you are being tested for the virus that causes COVID-19 so arrangements can be made, as necessary, to prevent transmission to others in the healthcare setting. Next, notify the local health department in your county.  If a medical emergency arises and you need to call 911, inform the first responders that you are being tested for the virus that causes COVID-19.  Next, notify the local health department in your county.  Adhere to all guidance set forth by the [facility] for [facility] of patients that is based on guidance from the Center for Disease Control and Prevention with suspected or confirmed COVID-19. It is provided with this guidance  for Persons Under Investigation.  Your health and the health of our community are our top priorities. Public Health officials remain available to provide assistance and counseling to you about COVID-19 and compliance with this guidance.  Provider: ____________________________________________________________ Date: ______/_____/_________  By signing below, you acknowledge that you have read and agree to comply with this Guidance for Persons Under Investigation. ______________________________________________________________ Date: ______/_____/_________  WHO DO I CALL? You can find a list of local health departments here: [email or web address] Health Department: ____________________________________________________________________ Contact Name: ________________________________________________________________________ Telephone: ___________________________________________________________________________  Marice Potter, [place], Communicable Disease Branch COVID-19 Guidance for Persons Under Investigation March 05, 2019

## 2019-05-27 NOTE — Plan of Care (Signed)
  Problem: Coping: Goal: Psychosocial and spiritual needs will be supported Outcome: Progressing   Problem: Respiratory: Goal: Will maintain a patent airway Outcome: Progressing Goal: Complications related to the disease process, condition or treatment will be avoided or minimized Outcome: Progressing   Problem: Clinical Measurements: Goal: Ability to maintain clinical measurements within normal limits will improve Outcome: Progressing Goal: Diagnostic test results will improve Outcome: Progressing Goal: Respiratory complications will improve Outcome: Progressing Goal: Cardiovascular complication will be avoided Outcome: Progressing   Problem: Activity: Goal: Risk for activity intolerance will decrease Outcome: Progressing   Problem: Coping: Goal: Level of anxiety will decrease Outcome: Progressing   Problem: Elimination: Goal: Will not experience complications related to bowel motility Outcome: Progressing Goal: Will not experience complications related to urinary retention Outcome: Progressing   Problem: Pain Managment: Goal: General experience of comfort will improve Outcome: Progressing   Problem: Safety: Goal: Ability to remain free from injury will improve Outcome: Progressing   Problem: Skin Integrity: Goal: Risk for impaired skin integrity will decrease Outcome: Progressing   Problem: Education: Goal: Knowledge of risk factors and measures for prevention of condition will improve Outcome: Not Progressing Note:  Due to cognitive deficit   Problem: Education: Goal: Knowledge of General Education information will improve Description Including pain rating scale, medication(s)/side effects and non-pharmacologic comfort measures Outcome: Not Progressing Note:  Due to cognitive deficit   Problem: Health Behavior/Discharge Planning: Goal: Ability to manage health-related needs will improve Outcome: Not Progressing   Problem: Nutrition: Goal: Adequate  nutrition will be maintained Outcome: Not Progressing Note:  Poor appetite

## 2019-05-27 NOTE — Progress Notes (Signed)
Family reports wanting patient to go Monday to [facility]. CSW informed Blanca Friend, they are in agreement as still no COVID test back that they require for admission.   [place], [place]

## 2019-05-27 NOTE — Progress Notes (Signed)
Patient needs rapid COVID 19 test to go to [place], today, MD made aware.   [place], [place]

## 2019-05-27 NOTE — Discharge Summary (Addendum)
Melissa Fischer IZT:[id number] DOB: Jan 13, 1934 DOA: 05/20/2019  PCP: Melissa Manes, MD  Admit date: 05/20/2019  Discharge date: 05/30/2019  Admitted From: SNF  Disposition:  SNF   Recommendations for Outpatient Follow-up:   Follow up with PCP in 1-2 weeks  PCP Please obtain BMP/CBC, 2 view CXR in 1week,  (see Discharge instructions)   PCP Please follow up on the following pending results:  outpatient dental follow-up in a week.   Home Health: None   Equipment/Devices: None  Consultations: None Discharge Condition: Stable   CODE STATUS: DNR   Diet Recommendation: Dysphagia 1    Chief Complaint  Patient presents with   Altered Mental Status   Weakness     Brief history of present illness from the day of admission and additional interim summary    Melissa Fischer a 85 y.o.Fwith dementia, DM, and COPD who presented from SNF with confusion and slurred speech. In the ER, repeat CT head (had had one 4 days prior) showed no stroke. COVID screening positive. Not hypoxic,CXR clear.                                                                 Hospital Course    1. Toxic encephalopathy due to acute Covid 19 Viral Illness during the ongoing 2020 Covid 19 Pandemic - CT head x 2 along with CT C-spine nonacute, stable UA and chest x-ray. She is oxygenating well on room air.  No focal deficits, mentation is stable but still mildly delirious at times due to advanced underlying dementia, he is stable from COVID infection standpoint and will be discharged back to SNF.  She has no pulmonary symptoms and remained on room air throughout.  2.  Recent history of dental infection.  Maxillofacial CT nonacute, she was treated here with clindamycin no subjective complaints and infection has likely healed, outpatient dental follow-up  is recommended in 1 to 2 weeks post discharge.  3.  Advanced dementia.  At risk for delirium, minimize narcotics and benzodiazepines, did not tolerate Exelon, Namenda or Aricept in the past.  Get agitated at times during the night and I have placed her on very low-dose of Seroquel at night, if she gets sedated this will need to be discontinued.  4.  COPD.  Stable supportive care.  5.  Dehydration.    Resolved after IV fluids.  6.  Severe hypokalemia and hypomagnesemia.  Both replaced and stable.  7.  Pansensitive E. coli UTI - responded to Keflex, continue for 3 more days at [facility].  8. DM type II.  Continue SNF regimen.  Discharge diagnosis     Principal Problem:   Acute encephalopathy Active Problems:   Alzheimer's dementia with behavioral disturbance (HCC)   Type 2 diabetes mellitus with hyperglycemia (HCC)   COPD (chronic obstructive pulmonary  disease) ([place])   COVID-19 virus infection    Discharge instructions    Discharge Instructions    Discharge instructions   Complete by:  As directed    Follow with Primary MD Melissa Manes, MD in 7 days   Get CBC, CMP, 2 view Chest X ray -  checked  by Primary MD or SNF MD in 5-7 days   Activity: As tolerated with Full fall precautions use walker/cane & assistance as needed  Disposition SNF  Diet: Dysphagia 1 diet with feeding assistance and aspiration precautions.    Special Instructions: If you have smoked or chewed Tobacco  in the last 2 yrs please stop smoking, stop any regular Alcohol  and or any Recreational drug use.  On your next visit with your primary care physician please Get Medicines reviewed and adjusted.  Please request your Prim.MD to go over all Hospital Tests and Procedure/Radiological results at the follow up, please get all Hospital records sent to your Prim MD by signing hospital release before you go home.  If you experience worsening of your admission symptoms, develop shortness of breath, life  threatening emergency, suicidal or homicidal thoughts you must seek medical attention immediately by calling 911 or calling your MD immediately  if symptoms less severe.  You Must read complete instructions/literature along with all the possible adverse reactions/side effects for all the Medicines you take and that have been prescribed to you. Take any new Medicines after you have completely understood and accpet all the possible adverse reactions/side effects.   Discharge patient   Complete by:  As directed    Discharge disposition:  03-Skilled [place]   Discharge patient date:  05/30/2019   Increase activity slowly   Complete by:  As directed       Discharge Medications   Allergies as of 05/30/2019      Reactions   Aricept [donepezil] Other (See Comments)   Leg cramps   Exelon [rivastigmine] Other (See Comments)   dizziness and sad   Namenda [memantine] Other (See Comments)   dizziness   Penicillins    "pt does not know its been so long" Has patient had a PCN reaction causing immediate rash, facial/tongue/throat swelling, SOB or lightheadedness with hypotension: Unknown Has patient had a PCN reaction causing severe rash involving mucus membranes or skin necrosis: Unknown Has patient had a PCN reaction that required hospitalization: Unknown Has patient had a PCN reaction occurring within the last 10 years: Unknown If all of the above answers are "NO", then may proceed with Cephalosporin use.   Statins Other (See Comments)   unknown   Sulfa Antibiotics Other (See Comments)   Unknown   Wellbutrin [bupropion] Other (See Comments)   Low sodium      Medication List    STOP taking these medications   LORazepam 0.5 MG tablet Commonly known as:  ATIVAN     TAKE these medications   acetaminophen 325 MG tablet Commonly known as:  Tylenol Take 2 tablets (650 mg total) by mouth every 8 (eight) hours as needed.   Calcium 600+D 600-800 MG-UNIT Tabs Generic drug:  Calcium  Carb-Cholecalciferol Take 1 tablet by mouth daily.   cephALEXin 500 MG capsule Commonly known as:  KEFLEX Take 1 capsule (500 mg total) by mouth 3 (three) times daily for 3 days. 3 more days   cholecalciferol 25 MCG (1000 UT) tablet Commonly known as:  VITAMIN D3 Take 2,000 Units by mouth daily.   dimethicone-zinc oxide cream Apply 1  application topically 3 (three) times daily as needed for dry skin.   fexofenadine 180 MG tablet Commonly known as:  ALLEGRA Take 180 mg by mouth daily.   Fluticasone-Salmeterol 250-50 MCG/DOSE Aepb Commonly known as:  ADVAIR Inhale 1 puff into the lungs 2 (two) times daily.   Fosamax 70 MG tablet Generic drug:  alendronate Take 70 mg by mouth every Monday. Take with a full glass of water on an empty stomach.   metFORMIN 500 MG 24 hr tablet Commonly known as:  GLUCOPHAGE-XR Take 500-1,000 mg by mouth 2 (two) times daily. Take two tablets in the morning and one tablet in the evening.   QUEtiapine 25 MG tablet Commonly known as:  SEROquel Take 0.5 tablets (12.5 mg total) by mouth at bedtime.        Contact information for follow-up providers    Stone[place], Christiane Ha, MD. Schedule an appointment as soon as possible for a visit in 1 week(s).   Specialty:  Internal Medicine Contact information: [address] [place] [address] [phone]            Contact information for after-discharge care    Destination    [facility] .   Service:  Skilled Nursing Contact information: [address] [phone]                  Major procedures and Radiology Reports - PLEASE review detailed and final reports thoroughly  -        Dg Chest 1 View  Result Date: 05/08/2019 CLINICAL DATA:  Unwitnessed fall. EXAM: CHEST  1 VIEW COMPARISON:  Radiographs of January 31, 2017. FINDINGS: The heart size and mediastinal contours are within normal limits. Both lungs are clear. No  pneumothorax or pleural effusion is noted. Left-sided pacemaker is unchanged in position. The visualized skeletal structures are unremarkable. IMPRESSION: No active disease. Electronically Signed   By: Marijo Conception M.D.   On: 05/08/2019 14:20   Ct Head Wo Contrast  Result Date: 05/21/2019 CLINICAL DATA:  Altered mental status, right-sided weakness, and slurred speech. EXAM: CT HEAD WITHOUT CONTRAST TECHNIQUE: Contiguous axial images were obtained from the base of the skull through the vertex without intravenous contrast. COMPARISON:  05/20/2019 FINDINGS: Brain: There is no evidence of acute infarct, intracranial hemorrhage, mass, midline shift, or extra-axial fluid collection. Patchy to confluent cerebral white matter hypodensities are unchanged and nonspecific but compatible with moderate to severe chronic small vessel ischemic disease. Moderate to severe cerebral atrophy is again noted. Vascular: Calcified atherosclerosis at the skull base. No hyperdense vessel. Skull: No acute fracture or suspicious osseous lesion. Chronic deformity of the right zygomatic arch. Sinuses/Orbits: Paranasal sinuses and mastoid air cells are clear. Bilateral cataract extraction. Other: None. IMPRESSION: 1. No evidence of acute intracranial abnormality. 2. Moderate to severe chronic small vessel ischemic disease and cerebral atrophy. Electronically Signed   By: Logan Bores M.D.   On: 05/21/2019 14:47   Ct Head Wo Contrast  Result Date: 05/20/2019 CLINICAL DATA:  Fall neck pain and altered mental status EXAM: CT HEAD WITHOUT CONTRAST CT CERVICAL SPINE WITHOUT CONTRAST TECHNIQUE: Multidetector CT imaging of the head and cervical spine was performed following the standard protocol without intravenous contrast. Multiplanar CT image reconstructions of the cervical spine were also generated. COMPARISON:  CT head and cervical spine 05/08/2019 FINDINGS: CT HEAD FINDINGS Brain: There is no mass, hemorrhage or extra-axial collection.  There is generalized atrophy without  lobar predilection. There is hypoattenuation of the periventricular white matter, most commonly indicating chronic ischemic microangiopathy. Vascular: No abnormal hyperdensity of the major intracranial arteries or dural venous sinuses. No intracranial atherosclerosis. Skull: The visualized skull base, calvarium and extracranial soft tissues are normal. Sinuses/Orbits: No fluid levels or advanced mucosal thickening of the visualized paranasal sinuses. No mastoid or middle ear effusion. The orbits are normal. CT CERVICAL SPINE FINDINGS Alignment: Grade 1 anterolisthesis at C4-5. Skull base and vertebrae: No acute fracture. Soft tissues and spinal canal: No prevertebral fluid or swelling. No visible canal hematoma. Disc levels: Multilevel moderate-to-severe facet hypertrophy. Upper chest: No pneumothorax, pulmonary nodule or pleural effusion. Other: Normal visualized paraspinal cervical soft tissues. IMPRESSION: 1. No acute abnormality of the head or cervical spine. 2. Chronic ischemic microangiopathy and generalized volume loss. 3. Multilevel cervical facet arthrosis with grade 1 anterolisthesis at C4-5, unchanged. Electronically Signed   By: Ulyses Jarred M.D.   On: 05/20/2019 20:30   Ct Head Wo Contrast  Result Date: 05/08/2019 CLINICAL DATA:  85 year old female with head and neck injury following fall. EXAM: CT HEAD WITHOUT CONTRAST CT CERVICAL SPINE WITHOUT CONTRAST TECHNIQUE: Multidetector CT imaging of the head and cervical spine was performed following the standard protocol without intravenous contrast. Multiplanar CT image reconstructions of the cervical spine were also generated. COMPARISON:  07/16/2018 and prior CTs FINDINGS: CT HEAD FINDINGS Brain: No evidence of acute infarction, hemorrhage, hydrocephalus, extra-axial collection or mass lesion/mass effect. Atrophy and chronic small-vessel white matter ischemic changes again noted. Vascular: Carotid atherosclerotic  calcifications again noted. Skull: No acute abnormality or suspicious focal lesion. A remote RIGHT zygoma fracture is noted. Sinuses/Orbits: No acute finding. Other: None. CT CERVICAL SPINE FINDINGS Alignment: Normal. Skull base and vertebrae: No acute fracture. No primary bone lesion or focal pathologic process. Soft tissues and spinal canal: No prevertebral fluid or swelling. No visible canal hematoma. Disc levels: Mild moderate multilevel degenerative disc disease/spondylosis noted, greatest at C5-6. Moderate multilevel facet arthropathy identified. Upper chest: No acute abnormality Other: None IMPRESSION: 1. No evidence of acute intracranial abnormality. Atrophy and chronic small-vessel white matter ischemic changes. 2. No static evidence of acute injury to the cervical spine. Multilevel degenerative changes. Electronically Signed   By: Margarette Canada M.D.   On: 05/08/2019 14:37   Ct Cervical Spine Wo Contrast  Result Date: 05/20/2019 CLINICAL DATA:  Fall neck pain and altered mental status EXAM: CT HEAD WITHOUT CONTRAST CT CERVICAL SPINE WITHOUT CONTRAST TECHNIQUE: Multidetector CT imaging of the head and cervical spine was performed following the standard protocol without intravenous contrast. Multiplanar CT image reconstructions of the cervical spine were also generated. COMPARISON:  CT head and cervical spine 05/08/2019 FINDINGS: CT HEAD FINDINGS Brain: There is no mass, hemorrhage or extra-axial collection. There is generalized atrophy without lobar predilection. There is hypoattenuation of the periventricular white matter, most commonly indicating chronic ischemic microangiopathy. Vascular: No abnormal hyperdensity of the major intracranial arteries or dural venous sinuses. No intracranial atherosclerosis. Skull: The visualized skull base, calvarium and extracranial soft tissues are normal. Sinuses/Orbits: No fluid levels or advanced mucosal thickening of the visualized paranasal sinuses. No mastoid or  middle ear effusion. The orbits are normal. CT CERVICAL SPINE FINDINGS Alignment: Grade 1 anterolisthesis at C4-5. Skull base and vertebrae: No acute fracture. Soft tissues and spinal canal: No prevertebral fluid or swelling. No visible canal hematoma. Disc levels: Multilevel moderate-to-severe facet hypertrophy. Upper chest: No pneumothorax, pulmonary nodule or pleural effusion. Other: Normal visualized paraspinal cervical soft tissues.  IMPRESSION: 1. No acute abnormality of the head or cervical spine. 2. Chronic ischemic microangiopathy and generalized volume loss. 3. Multilevel cervical facet arthrosis with grade 1 anterolisthesis at C4-5, unchanged. Electronically Signed   By: Ulyses Jarred M.D.   On: 05/20/2019 20:30   Ct Cervical Spine Wo Contrast  Result Date: 05/08/2019 CLINICAL DATA:  85 year old female with head and neck injury following fall. EXAM: CT HEAD WITHOUT CONTRAST CT CERVICAL SPINE WITHOUT CONTRAST TECHNIQUE: Multidetector CT imaging of the head and cervical spine was performed following the standard protocol without intravenous contrast. Multiplanar CT image reconstructions of the cervical spine were also generated. COMPARISON:  07/16/2018 and prior CTs FINDINGS: CT HEAD FINDINGS Brain: No evidence of acute infarction, hemorrhage, hydrocephalus, extra-axial collection or mass lesion/mass effect. Atrophy and chronic small-vessel white matter ischemic changes again noted. Vascular: Carotid atherosclerotic calcifications again noted. Skull: No acute abnormality or suspicious focal lesion. A remote RIGHT zygoma fracture is noted. Sinuses/Orbits: No acute finding. Other: None. CT CERVICAL SPINE FINDINGS Alignment: Normal. Skull base and vertebrae: No acute fracture. No primary bone lesion or focal pathologic process. Soft tissues and spinal canal: No prevertebral fluid or swelling. No visible canal hematoma. Disc levels: Mild moderate multilevel degenerative disc disease/spondylosis noted,  greatest at C5-6. Moderate multilevel facet arthropathy identified. Upper chest: No acute abnormality Other: None IMPRESSION: 1. No evidence of acute intracranial abnormality. Atrophy and chronic small-vessel white matter ischemic changes. 2. No static evidence of acute injury to the cervical spine. Multilevel degenerative changes. Electronically Signed   By: Margarette Canada M.D.   On: 05/08/2019 14:37   Ct Abdomen Pelvis W Contrast  Result Date: 05/20/2019 CLINICAL DATA:  Fall several days ago with persistent weakness and abdominal pain EXAM: CT ABDOMEN AND PELVIS WITH CONTRAST TECHNIQUE: Multidetector CT imaging of the abdomen and pelvis was performed using the standard protocol following bolus administration of intravenous contrast. CONTRAST:  123mL OMNIPAQUE IOHEXOL 300 MG/ML  SOLN COMPARISON:  None. FINDINGS: Lower chest: No acute abnormality. Hepatobiliary: No focal liver abnormality is seen. No gallstones, gallbladder wall thickening, or biliary dilatation. Pancreas: Unremarkable. No pancreatic ductal dilatation or surrounding inflammatory changes. Spleen: Normal in size without focal abnormality. Adrenals/Urinary Tract: Adrenal glands are within normal limits. The kidneys are well visualized bilaterally. No renal calculi or obstructive changes are seen. Normal excretion is noted bilaterally. Stomach/Bowel: Mild diverticular changes noted. There are changes suggestive of prior appendectomy. Clinical correlation is recommended. No inflammatory changes are seen. The stomach and small bowel appear within normal limits. Vascular/Lymphatic: Aortic atherosclerosis. No enlarged abdominal or pelvic lymph nodes. Reproductive: Status post hysterectomy. No adnexal masses. Other: No abdominal wall hernia or abnormality. No abdominopelvic ascites. Musculoskeletal: Postsurgical changes are noted in the right hip. No acute bony abnormality is noted. IMPRESSION: Diverticulosis without diverticulitis. No acute abnormality  noted. Electronically Signed   By: Inez Catalina M.D.   On: 05/20/2019 20:29   Ct Maxillofacial W Contrast  Result Date: 05/21/2019 CLINICAL DATA:  Oral cavity pain with bleeding and foul odor. EXAM: CT MAXILLOFACIAL WITH CONTRAST TECHNIQUE: Multidetector CT imaging of the maxillofacial structures was performed with intravenous contrast. Multiplanar CT image reconstructions were also generated. CONTRAST:  15mL OMNIPAQUE IOHEXOL 300 MG/ML  SOLN COMPARISON:  None. FINDINGS: The study is mildly motion degraded. Osseous: No acute fracture is identified within limitations of motion artifact. No destructive osseous lesion. No mandibular dislocation. Orbits: Bilateral cataract extraction. No acute inflammatory or traumatic findings. Sinuses: Paranasal sinuses and mastoid air cells are clear. Soft  tissues: No oral cavity or pharyngeal mass identified within limitations of dental streak artifact. No peritonsillar or retropharyngeal fluid collection. No parotid or submandibular space inflammation. Calcified atherosclerosis at the carotid bifurcations without evidence of significant stenosis. Limited intracranial: The brain is more fully evaluated on separate dedicated head CT. No abnormal intracranial enhancement is identified. IMPRESSION: No acute abnormality identified. Electronically Signed   By: Logan Bores M.D.   On: 05/21/2019 14:43   Dg Chest Port 1 View  Result Date: 05/28/2019 CLINICAL DATA:  Dyspnea EXAM: PORTABLE CHEST 1 VIEW COMPARISON:  05/20/2019 chest radiograph. FINDINGS: Stable configuration of 2 lead left subclavian pacemaker. Stable cardiomediastinal silhouette with normal heart size. No pneumothorax. No pleural effusion. Lungs appear clear, with no acute consolidative airspace disease and no pulmonary edema. IMPRESSION: No active disease. Electronically Signed   By: Ilona Sorrel M.D.   On: 05/28/2019 08:33   Dg Chest Port 1 View  Result Date: 05/20/2019 CLINICAL DATA:  Altered mental status.  EXAM: PORTABLE CHEST 1 VIEW COMPARISON:  Radiograph of May 08, 2019. FINDINGS: The heart size and mediastinal contours are within normal limits. Both lungs are clear. No pneumothorax or pleural effusion is noted. Left-sided pacemaker is unchanged in position. The visualized skeletal structures are unremarkable. IMPRESSION: No active disease. Electronically Signed   By: Marijo Conception M.D.   On: 05/20/2019 16:46   Dg Hips Bilat W Or Wo Pelvis 2 Views  Result Date: 05/08/2019 CLINICAL DATA:  Unwitnessed fall. EXAM: DG HIP (WITH OR WITHOUT PELVIS) 2V BILAT COMPARISON:  Radiographs of July 16, 2018. FINDINGS: There is no evidence of acute hip fracture or dislocation. There is no evidence of arthropathy. Status post surgical repair of old right proximal femoral fracture. IMPRESSION: No acute abnormality seen involving either hip. Electronically Signed   By: Marijo Conception M.D.   On: 05/08/2019 14:22    Micro Results     Recent Results (from the past 240 hour(s))  Blood Cultures (routine x 2)     Status: None   Collection Time: 05/20/19  3:07 PM  Result Value Ref Range Status   Specimen Description BLOOD LEFT ANTECUBITAL  Final   Special Requests   Final    BOTTLES DRAWN AEROBIC AND ANAEROBIC Blood Culture adequate volume   Culture   Final    NO GROWTH 5 DAYS Performed at [facility], [address], [place], [place] [address]    Report Status 05/25/2019 FINAL  Final  Blood Cultures (routine x 2)     Status: None   Collection Time: 05/20/19  5:30 PM  Result Value Ref Range Status   Specimen Description BLOOD RIGHT WRIST  Final   Special Requests   Final    BOTTLES DRAWN AEROBIC ONLY Blood Culture results may not be optimal due to an inadequate volume of blood received in culture bottles   Culture   Final    NO GROWTH 5 DAYS Performed at [facility], [place] [address] [place], [place] [address]    Report Status 05/25/2019 FINAL  Final  Urine culture     Status: None    Collection Time: 05/20/19  9:32 PM  Result Value Ref Range Status   Specimen Description URINE, CATHETERIZED  Final   Special Requests NONE  Final   Culture   Final    NO GROWTH Performed at [facility], [place] [address] [place], [place] [address]    Report Status 05/24/2019 FINAL  Final  SARS Coronavirus 2 (CEPHEID- Performed in [facility] hospital lab), Hosp Order     Status: Abnormal   Collection Time: 05/20/19 11:01 PM  Result Value Ref Range Status   SARS Coronavirus 2 POSITIVE (A) NEGATIVE Final    Comment: RESULT CALLED TO, READ BACK BY AND VERIFIED WITH: C. THOMPSON,CHARGE RN [id number] 05/21/2019 T. TYSOR (NOTE) If result is NEGATIVE SARS-CoV-2 target nucleic acids are NOT DETECTED. The SARS-CoV-2 RNA is generally detectable in upper and lower  respiratory specimens during the acute phase of infection. The lowest  concentration of SARS-CoV-2 viral copies this assay can detect is 250  copies / mL. A negative result does not preclude SARS-CoV-2 infection  and should not be used as the sole basis for treatment or other  patient management decisions.  A negative result may occur with  improper specimen collection / handling, submission of specimen other  than nasopharyngeal swab, presence of viral mutation(s) within the  areas targeted by this assay, and inadequate number of viral copies  (<250 copies / mL). A negative result must be combined with clinical  observations, patient history, and epidemiological information. If result is POSITIVE SARS-CoV-2 target nucleic acids are D ETECTED. The SARS-CoV-2 RNA is generally detectable in upper and lower  respiratory specimens during the acute phase of infection.  Positive  results are indicative of active infection with SARS-CoV-2.  Clinical  correlation with patient history and other diagnostic information is  necessary to determine patient infection status.  Positive results do  not rule out bacterial infection or co-infection  with other viruses. If result is PRESUMPTIVE POSTIVE SARS-CoV-2 nucleic acids MAY BE PRESENT.   A presumptive positive result was obtained on the submitted specimen  and confirmed on repeat testing.  While 2019 novel coronavirus  (SARS-CoV-2) nucleic acids may be present in the submitted sample  additional confirmatory testing may be necessary for epidemiological  and / or clinical management purposes  to differentiate between  SARS-CoV-2 and other Sarbecovirus currently known to infect humans.  If clinically indicated additional testing with an alternate test  methodology [phone]) is advised. The SARS-CoV-2 RNA is generally  detectable in upper and lower respiratory specimens during the acute  phase of infection. The expected result is Negative. Fact Sheet for Patients:  StrictlyIdeas.no Fact Sheet for Healthcare Providers: BankingDealers.co.za This test is not yet approved or cleared by the [place] FDA and has been authorized for detection and/or diagnosis of SARS-CoV-2 by FDA under an Emergency Use Authorization (EUA).  This EUA will remain in effect (meaning this test can be used) for the duration of the COVID-19 declaration under Section 564(b)(1) of the Act, 21 U.S.C. section 360bbb-3(b)(1), unless the authorization is terminated or revoked sooner. Performed at [facility], [place] [address] [place], [place] [address]   MRSA PCR Screening     Status: None   Collection Time: 05/21/19 12:52 AM  Result Value Ref Range Status   MRSA by PCR NEGATIVE NEGATIVE Final    Comment:        The GeneXpert MRSA Assay (FDA approved for NASAL specimens only), is one component of a comprehensive MRSA colonization surveillance program. It is not intended to diagnose MRSA infection nor to guide or monitor treatment for MRSA infections. Performed at [facility], [place] [address], [place], [place] [address]   SARS Coronavirus  2 (CEPHEID- Performed in [facility] hospital lab), Hosp Order     Status: Abnormal   Collection Time: 05/27/19 10:30  AM  Result Value Ref Range Status   SARS Coronavirus 2 POSITIVE (A) NEGATIVE Final    Comment: RESULT CALLED TO, READ BACK BY AND VERIFIED WITH: RUCKER AT 1827 ON 05/28/2019 BY JPM (NOTE) If result is NEGATIVE SARS-CoV-2 target nucleic acids are NOT DETECTED. The SARS-CoV-2 RNA is generally detectable in upper and lower  respiratory specimens during the acute phase of infection. The lowest  concentration of SARS-CoV-2 viral copies this assay can detect is 250  copies / mL. A negative result does not preclude SARS-CoV-2 infection  and should not be used as the sole basis for treatment or other  patient management decisions.  A negative result may occur with  improper specimen collection / handling, submission of specimen other  than nasopharyngeal swab, presence of viral mutation(s) within the  areas targeted by this assay, and inadequate number of viral copies  (<250 copies / mL). A negative result must be combined with clinical  observations, patient history, and epidemiological information. If result is POSITIVE SARS-CoV-2 target nucleic acids are DETECTED. Th e SARS-CoV-2 RNA is generally detectable in upper and lower  respiratory specimens during the acute phase of infection.  Positive  results are indicative of active infection with SARS-CoV-2.  Clinical  correlation with patient history and other diagnostic information is  necessary to determine patient infection status.  Positive results do  not rule out bacterial infection or co-infection with other viruses. If result is PRESUMPTIVE POSTIVE SARS-CoV-2 nucleic acids MAY BE PRESENT.   A presumptive positive result was obtained on the submitted specimen  and confirmed on repeat testing.  While 2019 novel coronavirus  (SARS-CoV-2) nucleic acids may be present in the submitted sample  additional confirmatory  testing may be necessary for epidemiological  and / or clinical management purposes  to differentiate between  SARS-CoV-2 and other Sarbecovirus currently known to infect humans.  If clinically indicated additional testing with an alternate test  methodology [phone]) is  advised. The SARS-CoV-2 RNA is generally  detectable in upper and lower respiratory specimens during the acute  phase of infection. The expected result is Negative. Fact Sheet for Patients:  StrictlyIdeas.no Fact Sheet for Healthcare Providers: BankingDealers.co.za This test is not yet approved or cleared by the [place] FDA and has been authorized for detection and/or diagnosis of SARS-CoV-2 by FDA under an Emergency Use Authorization (EUA).  This EUA will remain in effect (meaning this test can be used) for the duration of the COVID-19 declaration under Section 564(b)(1) of the Act, 21 U.S.C. section 360bbb-3(b)(1), unless the authorization is terminated or revoked sooner. Performed at [facility], [place] [address], [place], [place] [address]   Culture, Urine     Status: Abnormal   Collection Time: 05/28/19  7:48 AM  Result Value Ref Range Status   Specimen Description   Final    URINE, RANDOM Performed at [facility] [address] [place], [place] [address]    Special Requests   Final    NONE Performed at [facility], [place] [address], [place], [place] [address]    Culture >=100,000 COLONIES/mL ESCHERICHIA COLI (A)  Final   Report Status 05/30/2019 FINAL  Final   Organism ID, Bacteria ESCHERICHIA COLI (A)  Final      Susceptibility   Escherichia coli - MIC*    AMPICILLIN 4 SENSITIVE Sensitive     CEFAZOLIN <=4 SENSITIVE Sensitive     CEFTRIAXONE <=1 SENSITIVE Sensitive     CIPROFLOXACIN 0.5  SENSITIVE Sensitive     GENTAMICIN <=1 SENSITIVE Sensitive     IMIPENEM <=0.25 SENSITIVE Sensitive      NITROFURANTOIN <=16 SENSITIVE Sensitive     TRIMETH/SULFA >=320 RESISTANT Resistant     AMPICILLIN/SULBACTAM <=2 SENSITIVE Sensitive     PIP/TAZO <=4 SENSITIVE Sensitive     Extended ESBL NEGATIVE Sensitive     * >=100,000 COLONIES/mL ESCHERICHIA COLI    Today   Subjective    Doreene Jantz today has no headache,no chest abdominal pain,no new weakness tingling or numbness, feels much better.   Objective   Blood pressure (!) 159/72, pulse 82, temperature (!) 96.7 F (35.9 C), temperature source Axillary, resp. rate 18, height 5\' 8"  (1.727 m), weight 63.2 kg, SpO2 98 %.   Intake/Output Summary (Last 24 hours) at 05/30/2019 0949 Last data filed at 05/30/2019 0700 Gross per 24 hour  Intake 0 ml  Output 1075 ml  Net -1075 ml    Exam  Awake, pleasantly confused but in no distress, No new F.N deficits,   [place].AT,PERRAL Supple Neck,No JVD, No cervical lymphadenopathy appriciated.  Symmetrical Chest wall movement, Good air movement bilaterally, CTAB RRR,No Gallops,Rubs or new Murmurs, No Parasternal Heave +ve B.Sounds, Abd Soft, Non tender, No organomegaly appriciated, No rebound -guarding or rigidity. No Cyanosis, Clubbing or edema, No new Rash or bruise   Data Review   CBC w Diff:  Lab Results  Component Value Date   WBC 8.5 05/30/2019   HGB 9.6 (L) 05/30/2019   HCT 28.9 (L) 05/30/2019   HCT 36.0 05/21/2019   PLT 471 (H) 05/30/2019   LYMPHOPCT 32 05/30/2019   MONOPCT 7 05/30/2019   EOSPCT 1 05/30/2019   BASOPCT 0 05/30/2019    CMP:  Lab Results  Component Value Date   NA 137 05/30/2019   NA 134 (A) 05/01/2016   K 3.6 05/30/2019   CL 105 05/30/2019   CO2 23 05/30/2019   BUN 13 05/30/2019   BUN 13 05/01/2016   CREATININE 0.70 05/30/2019   GLU 152 05/01/2016   PROT 5.5 (L) 05/30/2019   ALBUMIN 2.7 (L) 05/30/2019   BILITOT 0.4 05/30/2019   ALKPHOS 73 05/30/2019   AST 17 05/30/2019   ALT 15 05/30/2019  .   Total Time in preparing paper work, data evaluation and  todays exam - 61 minutes  Lala Lund M.D on 05/30/2019 at 9:49 AM  [facility]   Office  [phone]

## 2019-05-27 NOTE — TOC Transition Note (Signed)
Transition of Care [facility]) - CM/SW Discharge Note   Patient Details  Name: Melissa Fischer MRN: [id number] Date of Birth: 01-08-1934  Transition of Care [facility]) CM/SW Contact:  Alberteen Sam, [name] Phone Number: 05/27/2019, 4:03 PM   Clinical Narrative:     Patient will DC to: [facility] (pending updated pos test) Anticipated DC date: 05/27/2019 Family notified:Karen Transport by: Corey Harold  Per MD patient ready for DC to [facility] . RN, patient, patient's family, and facility notified of DC. Discharge Summary sent to facility. RN given number for report [phone]. DC packet on chart. Ambulance transport will be requested for patient via nurse by calling [phone] once COVID pos test comes back.  CSW signing off.  [place], [place]   Final next level of care: Skilled Nursing Facility Barriers to Discharge: No Barriers Identified   Patient Goals and CMS Choice Patient states their goals for this hospitalization and ongoing recovery are:: patient unable to participate in goal setting at this time; patient's daughter hopeful that she will bounce back and be able to return to [employer].gov Compare Post Acute Care list provided to:: Patient Represenative (must comment)(Karen (daughter)) Choice offered to / list presented to : Adult Children  Discharge Placement PASRR number recieved: 05/24/19            Patient chooses bed at: [facility], [place] Patient to be transferred to facility by: [place] Name of family member notified: [name] Patient and family notified of of transfer: 05/27/19  Discharge Plan and Services                                     Social Determinants of Health (SDOH) Interventions     Readmission Risk Interventions No flowsheet data found.

## 2019-05-27 NOTE — Progress Notes (Signed)
Speech Language Pathology Dysphagia Treatment Patient Details Name: Melissa Fischer MRN: [id number] DOB: 04/26/1934 Today's Date: 05/27/2019 Time: 2197-5883 SLP Time Calculation (min) (ACUTE ONLY): 20 min  Assessment / Plan / Recommendation Clinical Impression   Pt much more interactive, smiling, and amenable to eating today.  Drank ginger ale and fed herself saltines with no overt s/s of dysphagia nor aspiration.  She requires verbal cues to persist through feeding, otherwise she will stop and not initiate again. She is reluctant to allow repositioning in bed, so remains partially reclined with neck in flexion to the right.  Despite suboptimal positioning, mechanics of swallow have improved.  She Facetimed with daughter, Melissa Fischer.  Recommend advancing diet to mechanical soft, thin liquids. Pt needs ongoing assistance with feeding; she will not feed herself.  SLP will follow briefly.    Diet Recommendation    mechanical soft, thin liquids   SLP Plan Continue with current plan of care      Swallowing Goals     General Behavior/Cognition: Alert;Cooperative Patient Positioning: Partially reclined HPI: 85 y.o. female with PMH of Alzheimers dementia with behavioral disturbance, type 2 diabetes mellitus, COPD, and history of mild renal insufficiency; admitted on 05/20/2019, presented with complaint of confusion and slurred speech, was found to have failure to thrive and severe dehydration.  Covid 19 (+); transferred to [facility] 5/24.  Pt with acute metabolic encephalopathy, CT negative for acute event.   Oral Cavity - Oral Hygiene     Dysphagia Treatment Family/Caregiver Educated: dtr Melissa Fischer via Facetime Treatment Methods: Skilled observation;Patient/caregiver education;Upgraded PO texture trial Patient observed directly with PO's: Yes Type of PO's observed: Thin liquids(crackers) Feeding: Needs assist Liquids provided via: Straw Oral Phase Signs & Symptoms: Other (comment)(limited intake) Type  of cueing: Verbal Amount of cueing: Moderate   GO     Melissa Fischer Melissa Fischer 05/27/2019, 2:14 PM  Melissa Fischer. Tivis Ringer, [name] Office number [phone] Pager [phone]

## 2019-05-28 ENCOUNTER — Inpatient Hospital Stay (HOSPITAL_COMMUNITY): Payer: Medicare Other

## 2019-05-28 LAB — CBC WITH DIFFERENTIAL/PLATELET
Abs Immature Granulocytes: 0.29 10*3/uL — ABNORMAL HIGH (ref 0.00–0.07)
Basophils Absolute: 0.1 10*3/uL (ref 0.0–0.1)
Basophils Relative: 0 %
Eosinophils Absolute: 0 10*3/uL (ref 0.0–0.5)
Eosinophils Relative: 0 %
HCT: 31.3 % — ABNORMAL LOW (ref 36.0–46.0)
Hemoglobin: 10.8 g/dL — ABNORMAL LOW (ref 12.0–15.0)
Immature Granulocytes: 1 %
Lymphocytes Relative: 3 %
Lymphs Abs: 0.8 10*3/uL (ref 0.7–4.0)
MCH: 30.5 pg (ref 26.0–34.0)
MCHC: 34.5 g/dL (ref 30.0–36.0)
MCV: 88.4 fL (ref 80.0–100.0)
Monocytes Absolute: 0.5 10*3/uL (ref 0.1–1.0)
Monocytes Relative: 2 %
Neutro Abs: 29.2 10*3/uL — ABNORMAL HIGH (ref 1.7–7.7)
Neutrophils Relative %: 94 %
Platelets: 458 10*3/uL — ABNORMAL HIGH (ref 150–400)
RBC: 3.54 MIL/uL — ABNORMAL LOW (ref 3.87–5.11)
RDW: 12.6 % (ref 11.5–15.5)
WBC: 30.8 10*3/uL — ABNORMAL HIGH (ref 4.0–10.5)
nRBC: 0 % (ref 0.0–0.2)

## 2019-05-28 LAB — GLUCOSE, CAPILLARY
Glucose-Capillary: 190 mg/dL — ABNORMAL HIGH (ref 70–99)
Glucose-Capillary: 190 mg/dL — ABNORMAL HIGH (ref 70–99)
Glucose-Capillary: 155 mg/dL — ABNORMAL HIGH (ref 70–99)
Glucose-Capillary: 140 mg/dL — ABNORMAL HIGH (ref 70–99)

## 2019-05-28 LAB — COMPREHENSIVE METABOLIC PANEL
ALT: 15 U/L (ref 0–44)
AST: 21 U/L (ref 15–41)
Albumin: 2.7 g/dL — ABNORMAL LOW (ref 3.5–5.0)
Alkaline Phosphatase: 92 U/L (ref 38–126)
Anion gap: 11 (ref 5–15)
BUN: 10 mg/dL (ref 8–23)
CO2: 21 mmol/L — ABNORMAL LOW (ref 22–32)
Calcium: 8.4 mg/dL — ABNORMAL LOW (ref 8.9–10.3)
Chloride: 105 mmol/L (ref 98–111)
Creatinine, Ser: 0.73 mg/dL (ref 0.44–1.00)
GFR calc Af Amer: >60 mL/min (ref >60)
GFR calc non Af Amer: >60 mL/min (ref >60)
Glucose, Bld: 134 mg/dL — ABNORMAL HIGH (ref 70–99)
Potassium: 3.2 mmol/L — ABNORMAL LOW (ref 3.5–5.1)
Sodium: 137 mmol/L (ref 135–145)
Total Bilirubin: 0.5 mg/dL (ref 0.3–1.2)
Total Protein: 6.2 g/dL — ABNORMAL LOW (ref 6.5–8.1)

## 2019-05-28 LAB — URINALYSIS, ROUTINE W REFLEX MICROSCOPIC
Bilirubin Urine: NEGATIVE
Glucose, UA: >=500 mg/dL — AB
Ketones, ur: 5 mg/dL — AB
Nitrite: NEGATIVE
Protein, ur: 30 mg/dL — AB
Specific Gravity, Urine: 1.012 (ref 1.005–1.030)
WBC, UA: >50 WBC/hpf — ABNORMAL HIGH (ref 0–5)
pH: 5 (ref 5.0–8.0)

## 2019-05-28 LAB — SARS CORONAVIRUS 2 BY RT PCR (HOSPITAL ORDER, PERFORMED IN ~~LOC~~ HOSPITAL LAB): SARS Coronavirus 2: POSITIVE — AB

## 2019-05-28 LAB — C-REACTIVE PROTEIN: CRP: 22.2 mg/dL — ABNORMAL HIGH (ref <1.0)

## 2019-05-28 LAB — FERRITIN: Ferritin: 362 ng/mL — ABNORMAL HIGH (ref 11–307)

## 2019-05-28 LAB — MAGNESIUM: Magnesium: 2 mg/dL (ref 1.7–2.4)

## 2019-05-28 LAB — PROCALCITONIN: Procalcitonin: 1.39 ng/mL

## 2019-05-28 LAB — D-DIMER, QUANTITATIVE: D-Dimer, Quant: 3.33 ug/mL-FEU — ABNORMAL HIGH (ref 0.00–0.50)

## 2019-05-28 MED ORDER — POTASSIUM CHLORIDE 2 MEQ/ML IV SOLN
INTRAVENOUS | Status: DC
  Administered 2019-05-28: 11:00:00 via INTRAVENOUS
  Filled 2019-05-28 (×2): qty 1000

## 2019-05-28 MED ORDER — POTASSIUM CHLORIDE 20 MEQ PO PACK
40.0000 meq | PACK | Freq: Once | ORAL | Status: AC
  Administered 2019-05-28: 40 meq via ORAL
  Filled 2019-05-28: qty 2

## 2019-05-28 NOTE — Plan of Care (Signed)
  Problem: Coping: Goal: Psychosocial and spiritual needs will be supported Outcome: Progressing   Problem: Respiratory: Goal: Will maintain a patent airway Outcome: Progressing Goal: Complications related to the disease process, condition or treatment will be avoided or minimized Outcome: Progressing   Problem: Clinical Measurements: Goal: Ability to maintain clinical measurements within normal limits will improve Outcome: Progressing Goal: Diagnostic test results will improve Outcome: Progressing Goal: Respiratory complications will improve Outcome: Progressing Goal: Cardiovascular complication will be avoided Outcome: Progressing   Problem: Activity: Goal: Risk for activity intolerance will decrease Outcome: Progressing   Problem: Coping: Goal: Level of anxiety will decrease Outcome: Progressing   Problem: Elimination: Goal: Will not experience complications related to bowel motility Outcome: Progressing Goal: Will not experience complications related to urinary retention Outcome: Progressing   Problem: Pain Managment: Goal: General experience of comfort will improve Outcome: Progressing   Problem: Safety: Goal: Ability to remain free from injury will improve Outcome: Progressing   Problem: Skin Integrity: Goal: Risk for impaired skin integrity will decrease Outcome: Progressing   Problem: Education: Goal: Knowledge of risk factors and measures for prevention of condition will improve Outcome: Not Progressing Note:  Due to cognitive deficits    Problem: Education: Goal: Knowledge of General Education information will improve Description Including pain rating scale, medication(s)/side effects and non-pharmacologic comfort measures Outcome: Not Progressing   Problem: Health Behavior/Discharge Planning: Goal: Ability to manage health-related needs will improve Outcome: Not Progressing   Problem: Nutrition: Goal: Adequate nutrition will be  maintained Outcome: Not Progressing Note:  Poor appetite

## 2019-05-28 NOTE — Progress Notes (Addendum)
0750  Resting in bed.   Confused.   Resp e/u.  On room air.  O2 sat 100%  1350  Assisted PT with transfer from bed to chair using lift.  Tolerated fair.  1500  Transferred to bed from chair with assist of NT and lift.  Due to posture and anxious movements she did not tolerate being up in chair.   1715  Bladder scan complete.  162 ml urine noted.  Asked her if she needed to urinate.  She states "not right now.  I will when I am ready."  Purewick in place to suction.

## 2019-05-28 NOTE — Progress Notes (Signed)
Physical Therapy Treatment Patient Details Name: Melissa Fischer MRN: [id number] DOB: 01/24/1934 Today's Date: 05/28/2019    History of Present Illness 85 y.o. F with dementia, DM, and COPD who presented from SNF with confusion and slurred speech. CT negative for acute findings. COVID screening positive. CT of head negative for acute stroke.  Pt dx with COVID pneumonitis without acute hypoxic respiratory failure, acute metabolic encepholopathy, hypokalemia, and dental infection.      PT Comments    The patient continues to posture with extension during any attempts at mobility. Patient constantly reaching out with hands. Assisted to Recliner with maximove. Patient continues to have head rotated and laterally flexed and complains of pain with any attempts to reposition the head. Continue PT attempts  Follow Up Recommendations  SNF     Equipment Recommendations  None recommended by PT    Recommendations for Other Services       Precautions / Restrictions Precautions Precautions: Fall;Other (comment) Precaution Comments: significant right head rotation and lateral lean, will reach out  and grab anything  close( mask etc)    Mobility  Bed Mobility               General bed mobility comments: patient placed in sitting upright  position in bed and maximove pad slid down and under patient with 2 total assist.Patient constantly reaching, would not keep hands inside equipment, PT held hands during transfer. Maximoe transfer to recliner.   Noted trunk and legs posturing  into  extension making it very difficult  getting patient's hips  back into recliner.. Patient noted to move the left leg more readily and proceded to slide down in recliner. Placed BSC at foot to attempt  more support at the legs. Later noted patient had knocked over the [facility].  Transfers                 General transfer comment: unable to attempt due to patient's resisistance and extension posturing and significant  anxiousness with any body movements.   Ambulation/Gait                 Stairs             Wheelchair Mobility    Modified Rankin (Stroke Patients Only)       Balance                                            Cognition Arousal/Alertness: Awake/alert     Area of Impairment: Orientation;Attention;Memory;Following commands;Safety/judgement;Awareness;Problem solving                 Orientation Level: Disoriented to;Place;Time;Situation Current Attention Level: Alternating   Following Commands: Follows one step commands inconsistently       General Comments: pt's speach is more sensible and appropriate as In" no don't move my head", continues to trust out UE's L>R when changing posiitions      Exercises      General Comments        Pertinent Vitals/Pain Faces Pain Scale: Hurts whole lot Pain Location: reaches for Left side of neck during attempts to move to reposition the head and during transfer Pain Descriptors / Indicators: Discomfort;Grimacing;Guarding;Moaning Pain Intervention(s): Monitored during session;Limited activity within patient's tolerance    Home Living  Prior Function            PT Goals (current goals can now be found in the care plan section) Progress towards PT goals: Not progressing toward goals - comment(significantly involved posturing and decreased ability to participate to build on progressing activity,)    Frequency    Min 3X/week      PT Plan Current plan remains appropriate    Co-evaluation              AM-PAC PT "6 Clicks" Mobility   Outcome Measure  Help needed turning from your back to your side while in a flat bed without using bedrails?: Total Help needed moving from lying on your back to sitting on the side of a flat bed without using bedrails?: Total Help needed moving to and from a bed to a chair (including a wheelchair)?: Total Help needed  standing up from a chair using your arms (e.g., wheelchair or bedside chair)?: Total Help needed to walk in hospital room?: Total Help needed climbing 3-5 steps with a railing? : Total 6 Click Score: 6    End of Session   Activity Tolerance: Patient tolerated treatment well Patient left: in chair;with call bell/phone within reach;with chair alarm set;with nursing/sitter in room Nurse Communication: Mobility status;Need for lift equipment PT Visit Diagnosis: Muscle weakness (generalized) (M62.81);Difficulty in walking, not elsewhere classified (R26.2)     Time: 3818-2993 PT Time Calculation (min) (ACUTE ONLY): 31 min  Charges:  $Therapeutic Activity: 23-37 mins                     [name] PT Acute Rehabilitation Services Pager [phone] Office [phone]    [name] 05/28/2019, 1:57 PM

## 2019-05-28 NOTE — Progress Notes (Signed)
Melissa Fischer UMP:[id number] DOB: 02/05/1934 DOA: 05/20/2019  PCP: Lajean Manes, MD  Admit date: 05/20/2019  Discharge date: 05/28/2019  Admitted From: SNF  Disposition:  SNF   Recommendations for Outpatient Follow-up:   Follow up with PCP in 1-2 weeks  PCP Please obtain BMP/CBC, 2 view CXR in 1week,  (see Discharge instructions)   PCP Please follow up on the following pending results:  outpatient dental follow-up in a week.   Home Health: None   Equipment/Devices: None  Consultations: None Discharge Condition: Stable   CODE STATUS: DNR   Diet Recommendation: Dysphagia 1    Chief Complaint  Patient presents with   Altered Mental Status   Weakness     Brief history of present illness from the day of admission and additional interim summary    Melissa Fischer a 85 y.o.Fwith dementia, DM, and COPD who presented from SNF with confusion and slurred speech. In the ER, repeat CT head (had had one 4 days prior) showed no stroke. COVID screening positive. Not hypoxic,CXR clear.                                                                 Hospital Course    1. Toxic encephalopathy due to acute Covid 19 Viral Illness during the ongoing 2020 Covid 19 Pandemic - CT head x 2 along with CT C-spine nonacute, stable UA and chest x-ray. She is oxygenating well on room air.  No focal deficits, mentation is stable but still mildly delirious at times due to advanced underlying dementia, he is stable from COVID infection standpoint and will be discharged back to SNF.  She has no pulmonary symptoms and remained on room air throughout.  2.  Recent history of dental infection.  Maxillofacial CT nonacute, she was treated here with clindamycin no subjective complaints and infection has likely healed, outpatient dental  follow-up is recommended in 1 to 2 weeks post discharge.  3.  Advanced dementia.  At risk for delirium, minimize narcotics and benzodiazepines, did not tolerate Exelon, Namenda or Aricept in the past.  Get agitated at times during the night and I have placed her on very low-dose of Seroquel at night, if she gets sedated this will need to be discontinued.  4.  COPD.  Stable supportive care.  5.  Dehydration.    Resolved after IV fluids.  6.    Hypokalemia.  Replaced.  7.  Erythematous skin rash on her back.  Appears to be clindamycin related which she received for dental infection.  Medication stopped.  Trial of steroids given.  Rash stable.  Monitor.    8.  Leukocytosis.  Likely steroid-induced, no diarrhea, repeat chest x-ray UA.  Monitor closely.    9. DM type II.  Continue present regimen and monitor CBGs.  CBG (last  3)  Recent Labs    05/27/19 1637 05/27/19 2151 05/28/19 0750  GLUCAP 341* 275* 190*     Discharge diagnosis     Principal Problem:   Acute encephalopathy Active Problems:   Alzheimer's dementia with behavioral disturbance (HCC)   Type 2 diabetes mellitus with hyperglycemia (HCC)   COPD (chronic obstructive pulmonary disease) ([place])    Discharge instructions    Discharge Instructions    Discharge instructions   Complete by:  As directed    Follow with Primary MD Lajean Manes, MD in 7 days   Get CBC, CMP, 2 view Chest X ray -  checked  by Primary MD or SNF MD in 5-7 days   Activity: As tolerated with Full fall precautions use walker/cane & assistance as needed  Disposition SNF  Diet: Dysphagia 1 diet with feeding assistance and aspiration precautions.    Special Instructions: If you have smoked or chewed Tobacco  in the last 2 yrs please stop smoking, stop any regular Alcohol  and or any Recreational drug use.  On your next visit with your primary care physician please Get Medicines reviewed and adjusted.  Please request your Prim.MD to go  over all Hospital Tests and Procedure/Radiological results at the follow up, please get all Hospital records sent to your Prim MD by signing hospital release before you go home.  If you experience worsening of your admission symptoms, develop shortness of breath, life threatening emergency, suicidal or homicidal thoughts you must seek medical attention immediately by calling 911 or calling your MD immediately  if symptoms less severe.  You Must read complete instructions/literature along with all the possible adverse reactions/side effects for all the Medicines you take and that have been prescribed to you. Take any new Medicines after you have completely understood and accpet all the possible adverse reactions/side effects.   Increase activity slowly   Complete by:  As directed       Discharge Medications   Allergies as of 05/28/2019      Reactions   Aricept [donepezil] Other (See Comments)   Leg cramps   Exelon [rivastigmine] Other (See Comments)   dizziness and sad   Namenda [memantine] Other (See Comments)   dizziness   Penicillins    "pt does not know its been so long" Has patient had a PCN reaction causing immediate rash, facial/tongue/throat swelling, SOB or lightheadedness with hypotension: Unknown Has patient had a PCN reaction causing severe rash involving mucus membranes or skin necrosis: Unknown Has patient had a PCN reaction that required hospitalization: Unknown Has patient had a PCN reaction occurring within the last 10 years: Unknown If all of the above answers are "NO", then may proceed with Cephalosporin use.   Statins Other (See Comments)   unknown   Sulfa Antibiotics Other (See Comments)   Unknown   Wellbutrin [bupropion] Other (See Comments)   Low sodium      Medication List    STOP taking these medications   LORazepam 0.5 MG tablet Commonly known as:  ATIVAN     TAKE these medications   acetaminophen 325 MG tablet Commonly known as:  Tylenol Take 2  tablets (650 mg total) by mouth every 8 (eight) hours as needed.   Calcium 600+D 600-800 MG-UNIT Tabs Generic drug:  Calcium Carb-Cholecalciferol Take 1 tablet by mouth daily.   cholecalciferol 25 MCG (1000 UT) tablet Commonly known as:  VITAMIN D3 Take 2,000 Units by mouth daily.   dimethicone-zinc oxide cream Apply 1 application topically  3 (three) times daily as needed for dry skin.   fexofenadine 180 MG tablet Commonly known as:  ALLEGRA Take 180 mg by mouth daily.   Fluticasone-Salmeterol 250-50 MCG/DOSE Aepb Commonly known as:  ADVAIR Inhale 1 puff into the lungs 2 (two) times daily.   Fosamax 70 MG tablet Generic drug:  alendronate Take 70 mg by mouth every Monday. Take with a full glass of water on an empty stomach.   metFORMIN 500 MG 24 hr tablet Commonly known as:  GLUCOPHAGE-XR Take 500-1,000 mg by mouth 2 (two) times daily. Take two tablets in the morning and one tablet in the evening.   QUEtiapine 25 MG tablet Commonly known as:  SEROquel Take 0.5 tablets (12.5 mg total) by mouth at bedtime.        Contact information for follow-up providers    Stone[place], Christiane Ha, MD. Schedule an appointment as soon as possible for a visit in 1 week(s).   Specialty:  Internal Medicine Contact information: [address] [place]  [address] [phone]            Contact information for after-discharge care    Destination    [facility] .   Service:  Skilled Nursing Contact information: [address] [phone]                  Major procedures and Radiology Reports - PLEASE review detailed and final reports thoroughly  -        Dg Chest 1 View  Result Date: 05/08/2019 CLINICAL DATA:  Unwitnessed fall. EXAM: CHEST  1 VIEW COMPARISON:  Radiographs of January 31, 2017. FINDINGS: The heart size and mediastinal contours are within normal limits. Both lungs are clear. No pneumothorax or  pleural effusion is noted. Left-sided pacemaker is unchanged in position. The visualized skeletal structures are unremarkable. IMPRESSION: No active disease. Electronically Signed   By: Marijo Conception M.D.   On: 05/08/2019 14:20   Ct Head Wo Contrast  Result Date: 05/21/2019 CLINICAL DATA:  Altered mental status, right-sided weakness, and slurred speech. EXAM: CT HEAD WITHOUT CONTRAST TECHNIQUE: Contiguous axial images were obtained from the base of the skull through the vertex without intravenous contrast. COMPARISON:  05/20/2019 FINDINGS: Brain: There is no evidence of acute infarct, intracranial hemorrhage, mass, midline shift, or extra-axial fluid collection. Patchy to confluent cerebral white matter hypodensities are unchanged and nonspecific but compatible with moderate to severe chronic small vessel ischemic disease. Moderate to severe cerebral atrophy is again noted. Vascular: Calcified atherosclerosis at the skull base. No hyperdense vessel. Skull: No acute fracture or suspicious osseous lesion. Chronic deformity of the right zygomatic arch. Sinuses/Orbits: Paranasal sinuses and mastoid air cells are clear. Bilateral cataract extraction. Other: None. IMPRESSION: 1. No evidence of acute intracranial abnormality. 2. Moderate to severe chronic small vessel ischemic disease and cerebral atrophy. Electronically Signed   By: Logan Bores M.D.   On: 05/21/2019 14:47   Ct Head Wo Contrast  Result Date: 05/20/2019 CLINICAL DATA:  Fall neck pain and altered mental status EXAM: CT HEAD WITHOUT CONTRAST CT CERVICAL SPINE WITHOUT CONTRAST TECHNIQUE: Multidetector CT imaging of the head and cervical spine was performed following the standard protocol without intravenous contrast. Multiplanar CT image reconstructions of the cervical spine were also generated. COMPARISON:  CT head and cervical spine 05/08/2019 FINDINGS: CT HEAD FINDINGS Brain: There is no mass, hemorrhage or extra-axial collection. There is  generalized atrophy without lobar predilection.  There is hypoattenuation of the periventricular white matter, most commonly indicating chronic ischemic microangiopathy. Vascular: No abnormal hyperdensity of the major intracranial arteries or dural venous sinuses. No intracranial atherosclerosis. Skull: The visualized skull base, calvarium and extracranial soft tissues are normal. Sinuses/Orbits: No fluid levels or advanced mucosal thickening of the visualized paranasal sinuses. No mastoid or middle ear effusion. The orbits are normal. CT CERVICAL SPINE FINDINGS Alignment: Grade 1 anterolisthesis at C4-5. Skull base and vertebrae: No acute fracture. Soft tissues and spinal canal: No prevertebral fluid or swelling. No visible canal hematoma. Disc levels: Multilevel moderate-to-severe facet hypertrophy. Upper chest: No pneumothorax, pulmonary nodule or pleural effusion. Other: Normal visualized paraspinal cervical soft tissues. IMPRESSION: 1. No acute abnormality of the head or cervical spine. 2. Chronic ischemic microangiopathy and generalized volume loss. 3. Multilevel cervical facet arthrosis with grade 1 anterolisthesis at C4-5, unchanged. Electronically Signed   By: Ulyses Jarred M.D.   On: 05/20/2019 20:30   Ct Head Wo Contrast  Result Date: 05/08/2019 CLINICAL DATA:  85 year old female with head and neck injury following fall. EXAM: CT HEAD WITHOUT CONTRAST CT CERVICAL SPINE WITHOUT CONTRAST TECHNIQUE: Multidetector CT imaging of the head and cervical spine was performed following the standard protocol without intravenous contrast. Multiplanar CT image reconstructions of the cervical spine were also generated. COMPARISON:  07/16/2018 and prior CTs FINDINGS: CT HEAD FINDINGS Brain: No evidence of acute infarction, hemorrhage, hydrocephalus, extra-axial collection or mass lesion/mass effect. Atrophy and chronic small-vessel white matter ischemic changes again noted. Vascular: Carotid atherosclerotic  calcifications again noted. Skull: No acute abnormality or suspicious focal lesion. A remote RIGHT zygoma fracture is noted. Sinuses/Orbits: No acute finding. Other: None. CT CERVICAL SPINE FINDINGS Alignment: Normal. Skull base and vertebrae: No acute fracture. No primary bone lesion or focal pathologic process. Soft tissues and spinal canal: No prevertebral fluid or swelling. No visible canal hematoma. Disc levels: Mild moderate multilevel degenerative disc disease/spondylosis noted, greatest at C5-6. Moderate multilevel facet arthropathy identified. Upper chest: No acute abnormality Other: None IMPRESSION: 1. No evidence of acute intracranial abnormality. Atrophy and chronic small-vessel white matter ischemic changes. 2. No static evidence of acute injury to the cervical spine. Multilevel degenerative changes. Electronically Signed   By: Margarette Canada M.D.   On: 05/08/2019 14:37   Ct Cervical Spine Wo Contrast  Result Date: 05/20/2019 CLINICAL DATA:  Fall neck pain and altered mental status EXAM: CT HEAD WITHOUT CONTRAST CT CERVICAL SPINE WITHOUT CONTRAST TECHNIQUE: Multidetector CT imaging of the head and cervical spine was performed following the standard protocol without intravenous contrast. Multiplanar CT image reconstructions of the cervical spine were also generated. COMPARISON:  CT head and cervical spine 05/08/2019 FINDINGS: CT HEAD FINDINGS Brain: There is no mass, hemorrhage or extra-axial collection. There is generalized atrophy without lobar predilection. There is hypoattenuation of the periventricular white matter, most commonly indicating chronic ischemic microangiopathy. Vascular: No abnormal hyperdensity of the major intracranial arteries or dural venous sinuses. No intracranial atherosclerosis. Skull: The visualized skull base, calvarium and extracranial soft tissues are normal. Sinuses/Orbits: No fluid levels or advanced mucosal thickening of the visualized paranasal sinuses. No mastoid or  middle ear effusion. The orbits are normal. CT CERVICAL SPINE FINDINGS Alignment: Grade 1 anterolisthesis at C4-5. Skull base and vertebrae: No acute fracture. Soft tissues and spinal canal: No prevertebral fluid or swelling. No visible canal hematoma. Disc levels: Multilevel moderate-to-severe facet hypertrophy. Upper chest: No pneumothorax, pulmonary nodule or pleural effusion. Other: Normal visualized paraspinal cervical soft tissues. IMPRESSION: 1.  No acute abnormality of the head or cervical spine. 2. Chronic ischemic microangiopathy and generalized volume loss. 3. Multilevel cervical facet arthrosis with grade 1 anterolisthesis at C4-5, unchanged. Electronically Signed   By: Ulyses Jarred M.D.   On: 05/20/2019 20:30   Ct Cervical Spine Wo Contrast  Result Date: 05/08/2019 CLINICAL DATA:  85 year old female with head and neck injury following fall. EXAM: CT HEAD WITHOUT CONTRAST CT CERVICAL SPINE WITHOUT CONTRAST TECHNIQUE: Multidetector CT imaging of the head and cervical spine was performed following the standard protocol without intravenous contrast. Multiplanar CT image reconstructions of the cervical spine were also generated. COMPARISON:  07/16/2018 and prior CTs FINDINGS: CT HEAD FINDINGS Brain: No evidence of acute infarction, hemorrhage, hydrocephalus, extra-axial collection or mass lesion/mass effect. Atrophy and chronic small-vessel white matter ischemic changes again noted. Vascular: Carotid atherosclerotic calcifications again noted. Skull: No acute abnormality or suspicious focal lesion. A remote RIGHT zygoma fracture is noted. Sinuses/Orbits: No acute finding. Other: None. CT CERVICAL SPINE FINDINGS Alignment: Normal. Skull base and vertebrae: No acute fracture. No primary bone lesion or focal pathologic process. Soft tissues and spinal canal: No prevertebral fluid or swelling. No visible canal hematoma. Disc levels: Mild moderate multilevel degenerative disc disease/spondylosis noted,  greatest at C5-6. Moderate multilevel facet arthropathy identified. Upper chest: No acute abnormality Other: None IMPRESSION: 1. No evidence of acute intracranial abnormality. Atrophy and chronic small-vessel white matter ischemic changes. 2. No static evidence of acute injury to the cervical spine. Multilevel degenerative changes. Electronically Signed   By: Margarette Canada M.D.   On: 05/08/2019 14:37   Ct Abdomen Pelvis W Contrast  Result Date: 05/20/2019 CLINICAL DATA:  Fall several days ago with persistent weakness and abdominal pain EXAM: CT ABDOMEN AND PELVIS WITH CONTRAST TECHNIQUE: Multidetector CT imaging of the abdomen and pelvis was performed using the standard protocol following bolus administration of intravenous contrast. CONTRAST:  141mL OMNIPAQUE IOHEXOL 300 MG/ML  SOLN COMPARISON:  None. FINDINGS: Lower chest: No acute abnormality. Hepatobiliary: No focal liver abnormality is seen. No gallstones, gallbladder wall thickening, or biliary dilatation. Pancreas: Unremarkable. No pancreatic ductal dilatation or surrounding inflammatory changes. Spleen: Normal in size without focal abnormality. Adrenals/Urinary Tract: Adrenal glands are within normal limits. The kidneys are well visualized bilaterally. No renal calculi or obstructive changes are seen. Normal excretion is noted bilaterally. Stomach/Bowel: Mild diverticular changes noted. There are changes suggestive of prior appendectomy. Clinical correlation is recommended. No inflammatory changes are seen. The stomach and small bowel appear within normal limits. Vascular/Lymphatic: Aortic atherosclerosis. No enlarged abdominal or pelvic lymph nodes. Reproductive: Status post hysterectomy. No adnexal masses. Other: No abdominal wall hernia or abnormality. No abdominopelvic ascites. Musculoskeletal: Postsurgical changes are noted in the right hip. No acute bony abnormality is noted. IMPRESSION: Diverticulosis without diverticulitis. No acute abnormality  noted. Electronically Signed   By: Inez Catalina M.D.   On: 05/20/2019 20:29   Ct Maxillofacial W Contrast  Result Date: 05/21/2019 CLINICAL DATA:  Oral cavity pain with bleeding and foul odor. EXAM: CT MAXILLOFACIAL WITH CONTRAST TECHNIQUE: Multidetector CT imaging of the maxillofacial structures was performed with intravenous contrast. Multiplanar CT image reconstructions were also generated. CONTRAST:  69mL OMNIPAQUE IOHEXOL 300 MG/ML  SOLN COMPARISON:  None. FINDINGS: The study is mildly motion degraded. Osseous: No acute fracture is identified within limitations of motion artifact. No destructive osseous lesion. No mandibular dislocation. Orbits: Bilateral cataract extraction. No acute inflammatory or traumatic findings. Sinuses: Paranasal sinuses and mastoid air cells are clear. Soft tissues: No  oral cavity or pharyngeal mass identified within limitations of dental streak artifact. No peritonsillar or retropharyngeal fluid collection. No parotid or submandibular space inflammation. Calcified atherosclerosis at the carotid bifurcations without evidence of significant stenosis. Limited intracranial: The brain is more fully evaluated on separate dedicated head CT. No abnormal intracranial enhancement is identified. IMPRESSION: No acute abnormality identified. Electronically Signed   By: Logan Bores M.D.   On: 05/21/2019 14:43   Dg Chest Port 1 View  Result Date: 05/28/2019 CLINICAL DATA:  Dyspnea EXAM: PORTABLE CHEST 1 VIEW COMPARISON:  05/20/2019 chest radiograph. FINDINGS: Stable configuration of 2 lead left subclavian pacemaker. Stable cardiomediastinal silhouette with normal heart size. No pneumothorax. No pleural effusion. Lungs appear clear, with no acute consolidative airspace disease and no pulmonary edema. IMPRESSION: No active disease. Electronically Signed   By: Ilona Sorrel M.D.   On: 05/28/2019 08:33   Dg Chest Port 1 View  Result Date: 05/20/2019 CLINICAL DATA:  Altered mental status.  EXAM: PORTABLE CHEST 1 VIEW COMPARISON:  Radiograph of May 08, 2019. FINDINGS: The heart size and mediastinal contours are within normal limits. Both lungs are clear. No pneumothorax or pleural effusion is noted. Left-sided pacemaker is unchanged in position. The visualized skeletal structures are unremarkable. IMPRESSION: No active disease. Electronically Signed   By: Marijo Conception M.D.   On: 05/20/2019 16:46   Dg Hips Bilat W Or Wo Pelvis 2 Views  Result Date: 05/08/2019 CLINICAL DATA:  Unwitnessed fall. EXAM: DG HIP (WITH OR WITHOUT PELVIS) 2V BILAT COMPARISON:  Radiographs of July 16, 2018. FINDINGS: There is no evidence of acute hip fracture or dislocation. There is no evidence of arthropathy. Status post surgical repair of old right proximal femoral fracture. IMPRESSION: No acute abnormality seen involving either hip. Electronically Signed   By: Marijo Conception M.D.   On: 05/08/2019 14:22    Micro Results     Recent Results (from the past 240 hour(s))  Blood Cultures (routine x 2)     Status: None   Collection Time: 05/20/19  3:07 PM  Result Value Ref Range Status   Specimen Description BLOOD LEFT ANTECUBITAL  Final   Special Requests   Final    BOTTLES DRAWN AEROBIC AND ANAEROBIC Blood Culture adequate volume   Culture   Final    NO GROWTH 5 DAYS Performed at [facility], [address] [place], [place] [address]    Report Status 05/25/2019 FINAL  Final  Blood Cultures (routine x 2)     Status: None   Collection Time: 05/20/19  5:30 PM  Result Value Ref Range Status   Specimen Description BLOOD RIGHT WRIST  Final   Special Requests   Final    BOTTLES DRAWN AEROBIC ONLY Blood Culture results may not be optimal due to an inadequate volume of blood received in culture bottles   Culture   Final    NO GROWTH 5 DAYS Performed at [facility], [place] [address], [place], [place] [address]    Report Status 05/25/2019 FINAL  Final  Urine culture     Status: None    Collection Time: 05/20/19  9:32 PM  Result Value Ref Range Status   Specimen Description URINE, CATHETERIZED  Final   Special Requests NONE  Final   Culture   Final    NO GROWTH Performed at [facility], [place] [address] [place],  [address]    Report Status 05/24/2019 FINAL  Final  SARS  Coronavirus 2 (CEPHEID- Performed in [place] hospital lab), Hosp Order     Status: Abnormal   Collection Time: 05/20/19 11:01 PM  Result Value Ref Range Status   SARS Coronavirus 2 POSITIVE (A) NEGATIVE Final    Comment: RESULT CALLED TO, READ BACK BY AND VERIFIED WITH: C. THOMPSON,CHARGE RN [id number] 05/21/2019 T. TYSOR (NOTE) If result is NEGATIVE SARS-CoV-2 target nucleic acids are NOT DETECTED. The SARS-CoV-2 RNA is generally detectable in upper and lower  respiratory specimens during the acute phase of infection. The lowest  concentration of SARS-CoV-2 viral copies this assay can detect is 250  copies / mL. A negative result does not preclude SARS-CoV-2 infection  and should not be used as the sole basis for treatment or other  patient management decisions.  A negative result may occur with  improper specimen collection / handling, submission of specimen other  than nasopharyngeal swab, presence of viral mutation(s) within the  areas targeted by this assay, and inadequate number of viral copies  (<250 copies / mL). A negative result must be combined with clinical  observations, patient history, and epidemiological information. If result is POSITIVE SARS-CoV-2 target nucleic acids are D ETECTED. The SARS-CoV-2 RNA is generally detectable in upper and lower  respiratory specimens during the acute phase of infection.  Positive  results are indicative of active infection with SARS-CoV-2.  Clinical  correlation with patient history and other diagnostic information is  necessary to determine patient infection status.  Positive results do  not rule out bacterial infection or co-infection  with other viruses. If result is PRESUMPTIVE POSTIVE SARS-CoV-2 nucleic acids MAY BE PRESENT.   A presumptive positive result was obtained on the submitted specimen  and confirmed on repeat testing.  While 2019 novel coronavirus  (SARS-CoV-2) nucleic acids may be present in the submitted sample  additional confirmatory testing may be necessary for epidemiological  and / or clinical management purposes  to differentiate between  SARS-CoV-2 and other Sarbecovirus currently known to infect humans.  If clinically indicated additional testing with an alternate test  methodology [phone]) is advised. The SARS-CoV-2 RNA is generally  detectable in upper and lower respiratory specimens during the acute  phase of infection. The expected result is Negative. Fact Sheet for Patients:  StrictlyIdeas.no Fact Sheet for Healthcare Providers: BankingDealers.co.za This test is not yet approved or cleared by the [place] FDA and has been authorized for detection and/or diagnosis of SARS-CoV-2 by FDA under an Emergency Use Authorization (EUA).  This EUA will remain in effect (meaning this test can be used) for the duration of the COVID-19 declaration under Section 564(b)(1) of the Act, 21 U.S.C. section 360bbb-3(b)(1), unless the authorization is terminated or revoked sooner. Performed at [facility], [place] [address], [place], [place] [address]   MRSA PCR Screening     Status: None   Collection Time: 05/21/19 12:52 AM  Result Value Ref Range Status   MRSA by PCR NEGATIVE NEGATIVE Final    Comment:        The GeneXpert MRSA Assay (FDA approved for NASAL specimens only), is one component of a comprehensive MRSA colonization surveillance program. It is not intended to diagnose MRSA infection nor to guide or monitor treatment for MRSA infections. Performed at [facility], [place] [address], [place], [place] [address]     Today    Subjective   Patient in bed, appears comfortable, denies any headache, no fever, no chest pain or pressure, no shortness  of breath , no abdominal pain. No focal weakness.   Objective   Blood pressure (!) 124/93, pulse 97, temperature 98.4 F (36.9 C), temperature source Oral, resp. rate 14, height 5\' 8"  (1.727 m), weight 63.2 kg, SpO2 100 %.   Intake/Output Summary (Last 24 hours) at 05/28/2019 1034 Last data filed at 05/27/2019 1100 Gross per 24 hour  Intake 50 ml  Output 350 ml  Net -300 ml    Exam  Awake, pleasantly confused but in no distress, No new F.N deficits,   [place].AT,PERRAL Supple Neck,No JVD, No cervical lymphadenopathy appriciated.  Symmetrical Chest wall movement, Good air movement bilaterally, CTAB RRR,No Gallops, Rubs or new Murmurs, No Parasternal Heave +ve B.Sounds, Abd Soft, No tenderness, No organomegaly appriciated, No rebound - guarding or rigidity. No Cyanosis, Clubbing, mild erythematous skin rash on her back    Data Review   CBC w Diff:  Lab Results  Component Value Date   WBC 30.8 (H) 05/28/2019   HGB 10.8 (L) 05/28/2019   HCT 31.3 (L) 05/28/2019   HCT 36.0 05/21/2019   PLT 458 (H) 05/28/2019   LYMPHOPCT 3 05/28/2019   MONOPCT 2 05/28/2019   EOSPCT 0 05/28/2019   BASOPCT 0 05/28/2019    CMP:  Lab Results  Component Value Date   NA 137 05/28/2019   NA 134 (A) 05/01/2016   K 3.2 (L) 05/28/2019   CL 105 05/28/2019   CO2 21 (L) 05/28/2019   BUN 10 05/28/2019   BUN 13 05/01/2016   CREATININE 0.73 05/28/2019   GLU 152 05/01/2016   PROT 6.2 (L) 05/28/2019   ALBUMIN 2.7 (L) 05/28/2019   BILITOT 0.5 05/28/2019   ALKPHOS 92 05/28/2019   AST 21 05/28/2019   ALT 15 05/28/2019  .   Total Time in preparing paper work, data evaluation and todays exam - 39 minutes  Lala Lund M.D on 05/28/2019 at 10:34 AM  [facility]   Office  [phone]

## 2019-05-29 LAB — COMPREHENSIVE METABOLIC PANEL
ALT: 16 U/L (ref 0–44)
AST: 20 U/L (ref 15–41)
Albumin: 2.9 g/dL — ABNORMAL LOW (ref 3.5–5.0)
Alkaline Phosphatase: 83 U/L (ref 38–126)
Anion gap: 10 (ref 5–15)
BUN: 15 mg/dL (ref 8–23)
CO2: 24 mmol/L (ref 22–32)
Calcium: 8.6 mg/dL — ABNORMAL LOW (ref 8.9–10.3)
Chloride: 107 mmol/L (ref 98–111)
Creatinine, Ser: 0.7 mg/dL (ref 0.44–1.00)
GFR calc Af Amer: >60 mL/min (ref >60)
GFR calc non Af Amer: >60 mL/min (ref >60)
Glucose, Bld: 124 mg/dL — ABNORMAL HIGH (ref 70–99)
Potassium: 3.9 mmol/L (ref 3.5–5.1)
Sodium: 141 mmol/L (ref 135–145)
Total Bilirubin: 0.3 mg/dL (ref 0.3–1.2)
Total Protein: 6.1 g/dL — ABNORMAL LOW (ref 6.5–8.1)

## 2019-05-29 LAB — FERRITIN: Ferritin: 381 ng/mL — ABNORMAL HIGH (ref 11–307)

## 2019-05-29 LAB — CBC WITH DIFFERENTIAL/PLATELET
Abs Immature Granulocytes: 0.16 10*3/uL — ABNORMAL HIGH (ref 0.00–0.07)
Basophils Absolute: 0 10*3/uL (ref 0.0–0.1)
Basophils Relative: 0 %
Eosinophils Absolute: 0 10*3/uL (ref 0.0–0.5)
Eosinophils Relative: 0 %
HCT: 30.4 % — ABNORMAL LOW (ref 36.0–46.0)
Hemoglobin: 10.1 g/dL — ABNORMAL LOW (ref 12.0–15.0)
Immature Granulocytes: 1 %
Lymphocytes Relative: 16 %
Lymphs Abs: 3 10*3/uL (ref 0.7–4.0)
MCH: 30.1 pg (ref 26.0–34.0)
MCHC: 33.2 g/dL (ref 30.0–36.0)
MCV: 90.7 fL (ref 80.0–100.0)
Monocytes Absolute: 0.9 10*3/uL (ref 0.1–1.0)
Monocytes Relative: 5 %
Neutro Abs: 14.5 10*3/uL — ABNORMAL HIGH (ref 1.7–7.7)
Neutrophils Relative %: 78 %
Platelets: 509 10*3/uL — ABNORMAL HIGH (ref 150–400)
RBC: 3.35 MIL/uL — ABNORMAL LOW (ref 3.87–5.11)
RDW: 13 % (ref 11.5–15.5)
WBC: 18.6 10*3/uL — ABNORMAL HIGH (ref 4.0–10.5)
nRBC: 0 % (ref 0.0–0.2)

## 2019-05-29 LAB — GLUCOSE, CAPILLARY
Glucose-Capillary: 108 mg/dL — ABNORMAL HIGH (ref 70–99)
Glucose-Capillary: 193 mg/dL — ABNORMAL HIGH (ref 70–99)
Glucose-Capillary: 99 mg/dL (ref 70–99)
Glucose-Capillary: 129 mg/dL — ABNORMAL HIGH (ref 70–99)

## 2019-05-29 LAB — MAGNESIUM: Magnesium: 2.1 mg/dL (ref 1.7–2.4)

## 2019-05-29 LAB — D-DIMER, QUANTITATIVE: D-Dimer, Quant: 1.59 ug/mL-FEU — ABNORMAL HIGH (ref 0.00–0.50)

## 2019-05-29 LAB — PROCALCITONIN: Procalcitonin: 1.34 ng/mL

## 2019-05-29 LAB — LACTATE DEHYDROGENASE: LDH: 198 U/L — ABNORMAL HIGH (ref 98–192)

## 2019-05-29 LAB — C-REACTIVE PROTEIN: CRP: 12.6 mg/dL — ABNORMAL HIGH (ref <1.0)

## 2019-05-29 MED ORDER — CEPHALEXIN 500 MG PO CAPS
500.0000 mg | ORAL_CAPSULE | Freq: Three times a day (TID) | ORAL | Status: DC
  Administered 2019-05-29 – 2019-05-30 (×3): 500 mg via ORAL
  Filled 2019-05-29 (×4): qty 1

## 2019-05-29 MED ORDER — DIPHENHYDRAMINE HCL 50 MG/ML IJ SOLN: 12.5000 mg | Freq: Three times a day (TID) | INTRAMUSCULAR | Status: DC | PRN

## 2019-05-29 NOTE — Plan of Care (Signed)
  Problem: Coping: Goal: Psychosocial and spiritual needs will be supported Outcome: Progressing   Problem: Respiratory: Goal: Will maintain a patent airway Outcome: Progressing Goal: Complications related to the disease process, condition or treatment will be avoided or minimized Outcome: Progressing   Problem: Clinical Measurements: Goal: Ability to maintain clinical measurements within normal limits will improve Outcome: Progressing Goal: Diagnostic test results will improve Outcome: Progressing Goal: Respiratory complications will improve Outcome: Progressing Goal: Cardiovascular complication will be avoided Outcome: Progressing   Problem: Activity: Goal: Risk for activity intolerance will decrease Outcome: Progressing   Problem: Nutrition: Goal: Adequate nutrition will be maintained Outcome: Progressing Note:  Poor appetite but some improved today   Problem: Coping: Goal: Level of anxiety will decrease Outcome: Progressing   Problem: Elimination: Goal: Will not experience complications related to bowel motility Outcome: Progressing   Problem: Pain Managment: Goal: General experience of comfort will improve Outcome: Progressing   Problem: Safety: Goal: Ability to remain free from injury will improve Outcome: Progressing   Problem: Skin Integrity: Goal: Risk for impaired skin integrity will decrease Outcome: Progressing   Problem: Education: Goal: Knowledge of risk factors and measures for prevention of condition will improve Outcome: Not Progressing Note:  Due to cognitive deficits     Problem: Education: Goal: Knowledge of General Education information will improve Description Including pain rating scale, medication(s)/side effects and non-pharmacologic comfort measures Outcome: Not Progressing   Problem: Health Behavior/Discharge Planning: Goal: Ability to manage health-related needs will improve Outcome: Not Progressing   Problem:  Elimination: Goal: Will not experience complications related to urinary retention Outcome: Not Progressing Note:  Some urinary retention noted with bladder scan but is voiding

## 2019-05-29 NOTE — Progress Notes (Signed)
PROGRESS NOTE                                                                                                                                                                                                             Patient Demographics:    Melissa Fischer, is a 85 y.o. female, DOB - January 25, 1934, OQH:[id number]  Outpatient Primary MD for the patient is Lajean Manes, MD    LOS - 8  Admit date - 05/20/2019    Chief Complaint  Patient presents with   Altered Mental Status   Weakness       Brief Narrative  Melissa Fischer a 85 y.o.Fwith dementia, DM, and COPD who presented from SNF with confusion and slurred speech. In the ER, repeat CT head (had had one 4 days prior) showed no stroke. COVID screening positive. Not hypoxic,CXR clear.   Subjective:    Melissa Fischer today in bed mildly confused but denies any headache chest or abdominal pain.  No shortness of breath.   Assessment  & Plan :    1. Toxic encephalopathy due to acute Covid 19 Viral Illness during the ongoing 2020 Covid 19 Pandemic - CT head x 2 along with CT C-spine nonacute, stable UA and chest x-ray. She is oxygenating well on room air. No focal deficits, mentation is stable but still mildly delirious at times due to advanced underlying dementia, he is stable from COVID infection standpoint and will be discharged back to [facility] Monday.  She has no pulmonary symptoms and remained on room air throughout.  COVID-19 Labs  Recent Labs    05/27/19 0500 05/28/19 0347 05/29/19 0440  DDIMER 1.87* 3.33* 1.59*  FERRITIN 258 362* 381*  LDH  --   --  198*  CRP 16.5* 22.2* 12.6*    Lab Results  Component Value Date   SARSCOV2NAA POSITIVE (A) 05/27/2019   SARSCOV2NAA POSITIVE (A) 05/20/2019     2. Recent history of dental infection. Maxillofacial CT nonacute, she was treated here with clindamycin no subjective complaints and infection has likely healed,  outpatient dental follow-up is recommended in 1 to 2 weeks post discharge.  3. Advanced dementia. At risk for delirium, minimize narcotics and benzodiazepines, did not tolerate Exelon, Namenda or Aricept in the past.  Get agitated at times during the night and I have placed her on very low-dose  of Seroquel at night, if she gets sedated this will need to be discontinued.  4. COPD. Stable supportive care.  5. Dehydration.   Resolved after IV fluids.  6. Severe hypokalemia and hypomagnesemia. Both replaced and stable.  7.  UTI.  Monitor bladder scans to avoid urinary retention, Keflex empirically for now.    8. DM type II.  Continue sliding scale and monitor.  Upon discharge resume SNF regimen.    CBG (last 3)  Recent Labs    05/28/19 1633 05/28/19 2054 05/29/19 0806  GLUCAP 155* 140* 108*     Condition -  Guarded  Family Communication  :     Code Status :  DNR  Diet : Soft  Disposition Plan  :  SNF  Consults  :  None  Procedures  :  None  PUD Prophylaxis :   DVT Prophylaxis  :  Lovenox   Lab Results  Component Value Date   PLT 509 (H) 05/29/2019    Inpatient Medications  Scheduled Meds:  cephALEXin  500 mg Oral Q8H   chlorhexidine  15 mL Mouth Rinse BID   enoxaparin (LOVENOX) injection  40 mg Subcutaneous Q24H   feeding supplement (ENSURE ENLIVE)  237 mL Oral TID BM   insulin aspart  0-5 Units Subcutaneous QHS   insulin aspart  0-9 Units Subcutaneous TID WC   mouth rinse  15 mL Mouth Rinse q12n4p   mometasone-formoterol  2 puff Inhalation BID   sodium chloride flush  3 mL Intravenous Q12H   Continuous Infusions:  sodium chloride Stopped (05/25/19 1134)   PRN Meds:.sodium chloride, acetaminophen **OR** acetaminophen, albuterol, antiseptic oral rinse, diphenhydrAMINE, hydrALAZINE, lip balm, LORazepam, [DISCONTINUED] ondansetron **OR** ondansetron (ZOFRAN) IV, senna-docusate  Antibiotics  :    Anti-infectives (From admission,  onward)   Start     Dose/Rate Route Frequency Ordered Stop   05/29/19 1045  cephALEXin (KEFLEX) capsule 500 mg     500 mg Oral Every 8 hours 05/29/19 1044     05/21/19 1400  clindamycin (CLEOCIN) IVPB 600 mg  Status:  Discontinued     600 mg 100 mL/hr over 30 Minutes Intravenous Every 8 hours 05/21/19 1212 05/27/19 1117       Time Spent in minutes  30   Lala Lund M.D on 05/29/2019 at 10:48 AM  To page go to [email or web address] - password [facility]  [facility] -  Office  [phone]    See all Orders from today for further details    Objective:   Vitals:   05/29/19 0003 05/29/19 0016 05/29/19 0254 05/29/19 0808  BP:  137/63 (!) 145/70 (!) 143/66  Pulse: 85 86 79 81  Resp:   15 13  Temp: 97.9 F (36.6 C)  98.2 F (36.8 C) 98.2 F (36.8 C)  TempSrc: Axillary  Axillary Axillary  SpO2: 100% 100% 91% 100%  Weight:      Height:        Wt Readings from Last 3 Encounters:  05/20/19 63.2 kg  12/06/18 70.6 kg  12/02/17 74.4 kg     Intake/Output Summary (Last 24 hours) at 05/29/2019 1048 Last data filed at 05/29/2019 0921 Gross per 24 hour  Intake 540 ml  Output --  Net 540 ml     Physical Exam  Awake but pleasantly confused, No new F.N deficits, Normal affect [place].AT,PERRAL Supple Neck,No JVD, No cervical lymphadenopathy appriciated.  Symmetrical Chest wall movement, Good air movement bilaterally, CTAB RRR,No Gallops,Rubs or new Murmurs, No Parasternal Heave +ve  B.Sounds, Abd Soft, No tenderness, No organomegaly appriciated, No rebound - guarding or rigidity. No Cyanosis, Clubbing or edema, No new Rash or bruise      Data Review:    CBC Recent Labs  Lab 05/25/19 0324 05/26/19 0330 05/27/19 0500 05/28/19 0347 05/29/19 0440  WBC 14.2* 17.3* 17.4* 30.8* 18.6*  HGB 12.2 12.0 10.9* 10.8* 10.1*  HCT 36.5 34.2* 31.3* 31.3* 30.4*  PLT 443* 438* 413* 458* 509*  MCV 87.7 87.2 87.2 88.4 90.7  MCH 29.3 30.6 30.4 30.5 30.1  MCHC 33.4 35.1 34.8 34.5 33.2  RDW  12.0 12.1 12.3 12.6 13.0  LYMPHSABS 3.0 2.4 2.3 0.8 3.0  MONOABS 0.8 1.4* 1.3* 0.5 0.9  EOSABS 0.2 0.2 0.1 0.0 0.0  BASOSABS 0.1 0.1 0.1 0.1 0.0    Chemistries  Recent Labs  Lab 05/25/19 0324 05/26/19 0330 05/27/19 0500 05/28/19 0347 05/29/19 0440  NA 135 133* 135 137 141  K 2.8* 3.5 3.3* 3.2* 3.9  CL 102 100 104 105 107  CO2 21* 23 22 21* 24  GLUCOSE 133* 226* 76 134* 124*  BUN 12 7* 7* 10 15  CREATININE 0.60 0.55 0.61 0.73 0.70  CALCIUM 8.0* 8.2* 8.0* 8.4* 8.6*  MG 1.6* 2.0 1.9 2.0 2.1  AST 21 18 18 21 20   ALT 16 14 14 15 16   ALKPHOS 69 82 83 92 83  BILITOT 0.6 0.5 0.6 0.5 0.3   ------------------------------------------------------------------------------------------------------------------ No results for input(s): CHOL, HDL, LDLCALC, TRIG, CHOLHDL, LDLDIRECT in the last 72 hours.  Lab Results  Component Value Date   HGBA1C 7.9 (H) 01/26/2017   ------------------------------------------------------------------------------------------------------------------ No results for input(s): TSH, T4TOTAL, T3FREE, THYROIDAB in the last 72 hours.  Invalid input(s): FREET3  Cardiac Enzymes No results for input(s): CKMB, TROPONINI, MYOGLOBIN in the last 168 hours.  Invalid input(s): CK ------------------------------------------------------------------------------------------------------------------ No results found for: BNP  Micro Results Recent Results (from the past 240 hour(s))  Blood Cultures (routine x 2)     Status: None   Collection Time: 05/20/19  3:07 PM  Result Value Ref Range Status   Specimen Description BLOOD LEFT ANTECUBITAL  Final   Special Requests   Final    BOTTLES DRAWN AEROBIC AND ANAEROBIC Blood Culture adequate volume   Culture   Final    NO GROWTH 5 DAYS Performed at [facility], [address], [place], [place] [address]    Report Status 05/25/2019 FINAL  Final  Blood Cultures (routine x 2)     Status: None   Collection Time: 05/20/19   5:30 PM  Result Value Ref Range Status   Specimen Description BLOOD RIGHT WRIST  Final   Special Requests   Final    BOTTLES DRAWN AEROBIC ONLY Blood Culture results may not be optimal due to an inadequate volume of blood received in culture bottles   Culture   Final    NO GROWTH 5 DAYS Performed at [facility], [place] [address] [place], [place] [address]    Report Status 05/25/2019 FINAL  Final  Urine culture     Status: None   Collection Time: 05/20/19  9:32 PM  Result Value Ref Range Status   Specimen Description URINE, CATHETERIZED  Final   Special Requests NONE  Final   Culture   Final    NO GROWTH Performed at [facility], [place] [address], [place],  [address]    Report Status 05/24/2019 FINAL  Final  SARS Coronavirus 2 (CEPHEID- Performed in  [facility] hospital lab), Hosp Order     Status: Abnormal   Collection Time: 05/20/19 11:01 PM  Result Value Ref Range Status   SARS Coronavirus 2 POSITIVE (A) NEGATIVE Final    Comment: RESULT CALLED TO, READ BACK BY AND VERIFIED WITH: C. THOMPSON,CHARGE RN 0031 05/21/2019 T. TYSOR (NOTE) If result is NEGATIVE SARS-CoV-2 target nucleic acids are NOT DETECTED. The SARS-CoV-2 RNA is generally detectable in upper and lower  respiratory specimens during the acute phase of infection. The lowest  concentration of SARS-CoV-2 viral copies this assay can detect is 250  copies / mL. A negative result does not preclude SARS-CoV-2 infection  and should not be used as the sole basis for treatment or other  patient management decisions.  A negative result may occur with  improper specimen collection / handling, submission of specimen other  than nasopharyngeal swab, presence of viral mutation(s) within the  areas targeted by this assay, and inadequate number of viral copies  (<250 copies / mL). A negative result must be combined with clinical  observations, patient history, and epidemiological information. If result is  POSITIVE SARS-CoV-2 target nucleic acids are D ETECTED. The SARS-CoV-2 RNA is generally detectable in upper and lower  respiratory specimens during the acute phase of infection.  Positive  results are indicative of active infection with SARS-CoV-2.  Clinical  correlation with patient history and other diagnostic information is  necessary to determine patient infection status.  Positive results do  not rule out bacterial infection or co-infection with other viruses. If result is PRESUMPTIVE POSTIVE SARS-CoV-2 nucleic acids MAY BE PRESENT.   A presumptive positive result was obtained on the submitted specimen  and confirmed on repeat testing.  While 2019 novel coronavirus  (SARS-CoV-2) nucleic acids may be present in the submitted sample  additional confirmatory testing may be necessary for epidemiological  and / or clinical management purposes  to differentiate between  SARS-CoV-2 and other Sarbecovirus currently known to infect humans.  If clinically indicated additional testing with an alternate test  methodology [phone]) is advised. The SARS-CoV-2 RNA is generally  detectable in upper and lower respiratory specimens during the acute  phase of infection. The expected result is Negative. Fact Sheet for Patients:  StrictlyIdeas.no Fact Sheet for Healthcare Providers: BankingDealers.co.za This test is not yet approved or cleared by the [place] FDA and has been authorized for detection and/or diagnosis of SARS-CoV-2 by FDA under an Emergency Use Authorization (EUA).  This EUA will remain in effect (meaning this test can be used) for the duration of the COVID-19 declaration under Section 564(b)(1) of the Act, 21 U.S.C. section 360bbb-3(b)(1), unless the authorization is terminated or revoked sooner. Performed at [facility], [place] [address] [place], [place] [address]   MRSA PCR Screening     Status: None   Collection Time:  05/21/19 12:52 AM  Result Value Ref Range Status   MRSA by PCR NEGATIVE NEGATIVE Final    Comment:        The GeneXpert MRSA Assay (FDA approved for NASAL specimens only), is one component of a comprehensive MRSA colonization surveillance program. It is not intended to diagnose MRSA infection nor to guide or monitor treatment for MRSA infections. Performed at [facility], [place] [address] [place], [place] [address]   SARS Coronavirus 2 (CEPHEID- Performed in [facility] hospital lab), Hosp Order     Status: Abnormal   Collection Time: 05/27/19 10:30 AM  Result Value Ref Range  Status   SARS Coronavirus 2 POSITIVE (A) NEGATIVE Final    Comment: RESULT CALLED TO, READ BACK BY AND VERIFIED WITH: RUCKER AT 1827 ON 05/28/2019 BY JPM (NOTE) If result is NEGATIVE SARS-CoV-2 target nucleic acids are NOT DETECTED. The SARS-CoV-2 RNA is generally detectable in upper and lower  respiratory specimens during the acute phase of infection. The lowest  concentration of SARS-CoV-2 viral copies this assay can detect is 250  copies / mL. A negative result does not preclude SARS-CoV-2 infection  and should not be used as the sole basis for treatment or other  patient management decisions.  A negative result may occur with  improper specimen collection / handling, submission of specimen other  than nasopharyngeal swab, presence of viral mutation(s) within the  areas targeted by this assay, and inadequate number of viral copies  (<250 copies / mL). A negative result must be combined with clinical  observations, patient history, and epidemiological information. If result is POSITIVE SARS-CoV-2 target nucleic acids are DETECTED. Th e SARS-CoV-2 RNA is generally detectable in upper and lower  respiratory specimens during the acute phase of infection.  Positive  results are indicative of active infection with SARS-CoV-2.  Clinical  correlation with patient history and other diagnostic information  is  necessary to determine patient infection status.  Positive results do  not rule out bacterial infection or co-infection with other viruses. If result is PRESUMPTIVE POSTIVE SARS-CoV-2 nucleic acids MAY BE PRESENT.   A presumptive positive result was obtained on the submitted specimen  and confirmed on repeat testing.  While 2019 novel coronavirus  (SARS-CoV-2) nucleic acids may be present in the submitted sample  additional confirmatory testing may be necessary for epidemiological  and / or clinical management purposes  to differentiate between  SARS-CoV-2 and other Sarbecovirus currently known to infect humans.  If clinically indicated additional testing with an alternate test  methodology [phone]) is  advised. The SARS-CoV-2 RNA is generally  detectable in upper and lower respiratory specimens during the acute  phase of infection. The expected result is Negative. Fact Sheet for Patients:  StrictlyIdeas.no Fact Sheet for Healthcare Providers: BankingDealers.co.za This test is not yet approved or cleared by the [place] FDA and has been authorized for detection and/or diagnosis of SARS-CoV-2 by FDA under an Emergency Use Authorization (EUA).  This EUA will remain in effect (meaning this test can be used) for the duration of the COVID-19 declaration under Section 564(b)(1) of the Act, 21 U.S.C. section 360bbb-3(b)(1), unless the authorization is terminated or revoked sooner. Performed at [facility], [place] [address] [place], [place] [address]   Culture, Urine     Status: Abnormal (Preliminary result)   Collection Time: 05/28/19  7:48 AM  Result Value Ref Range Status   Specimen Description   Final    URINE, RANDOM Performed at [facility], [place] [address] [place], [place] [address]    Special Requests   Final    NONE Performed at [facility], [place] [address],  [place], [place] [address]    Culture >=100,000 COLONIES/mL GRAM NEGATIVE RODS (A)  Final   Report Status PENDING  Incomplete    Radiology Reports Dg Chest 1 View  Result Date: 05/08/2019 CLINICAL DATA:  Unwitnessed fall. EXAM: CHEST  1 VIEW COMPARISON:  Radiographs of January 31, 2017. FINDINGS: The heart size and mediastinal contours are within normal limits. Both lungs are clear. No pneumothorax or pleural effusion is noted. Left-sided pacemaker  is unchanged in position. The visualized skeletal structures are unremarkable. IMPRESSION: No active disease. Electronically Signed   By: Marijo Conception M.D.   On: 05/08/2019 14:20   Ct Head Wo Contrast  Result Date: 05/21/2019 CLINICAL DATA:  Altered mental status, right-sided weakness, and slurred speech. EXAM: CT HEAD WITHOUT CONTRAST TECHNIQUE: Contiguous axial images were obtained from the base of the skull through the vertex without intravenous contrast. COMPARISON:  05/20/2019 FINDINGS: Brain: There is no evidence of acute infarct, intracranial hemorrhage, mass, midline shift, or extra-axial fluid collection. Patchy to confluent cerebral white matter hypodensities are unchanged and nonspecific but compatible with moderate to severe chronic small vessel ischemic disease. Moderate to severe cerebral atrophy is again noted. Vascular: Calcified atherosclerosis at the skull base. No hyperdense vessel. Skull: No acute fracture or suspicious osseous lesion. Chronic deformity of the right zygomatic arch. Sinuses/Orbits: Paranasal sinuses and mastoid air cells are clear. Bilateral cataract extraction. Other: None. IMPRESSION: 1. No evidence of acute intracranial abnormality. 2. Moderate to severe chronic small vessel ischemic disease and cerebral atrophy. Electronically Signed   By: Logan Bores M.D.   On: 05/21/2019 14:47   Ct Head Wo Contrast  Result Date: 05/20/2019 CLINICAL DATA:  Fall neck pain and altered mental status EXAM: CT HEAD WITHOUT CONTRAST CT  CERVICAL SPINE WITHOUT CONTRAST TECHNIQUE: Multidetector CT imaging of the head and cervical spine was performed following the standard protocol without intravenous contrast. Multiplanar CT image reconstructions of the cervical spine were also generated. COMPARISON:  CT head and cervical spine 05/08/2019 FINDINGS: CT HEAD FINDINGS Brain: There is no mass, hemorrhage or extra-axial collection. There is generalized atrophy without lobar predilection. There is hypoattenuation of the periventricular white matter, most commonly indicating chronic ischemic microangiopathy. Vascular: No abnormal hyperdensity of the major intracranial arteries or dural venous sinuses. No intracranial atherosclerosis. Skull: The visualized skull base, calvarium and extracranial soft tissues are normal. Sinuses/Orbits: No fluid levels or advanced mucosal thickening of the visualized paranasal sinuses. No mastoid or middle ear effusion. The orbits are normal. CT CERVICAL SPINE FINDINGS Alignment: Grade 1 anterolisthesis at C4-5. Skull base and vertebrae: No acute fracture. Soft tissues and spinal canal: No prevertebral fluid or swelling. No visible canal hematoma. Disc levels: Multilevel moderate-to-severe facet hypertrophy. Upper chest: No pneumothorax, pulmonary nodule or pleural effusion. Other: Normal visualized paraspinal cervical soft tissues. IMPRESSION: 1. No acute abnormality of the head or cervical spine. 2. Chronic ischemic microangiopathy and generalized volume loss. 3. Multilevel cervical facet arthrosis with grade 1 anterolisthesis at C4-5, unchanged. Electronically Signed   By: Ulyses Jarred M.D.   On: 05/20/2019 20:30   Ct Head Wo Contrast  Result Date: 05/08/2019 CLINICAL DATA:  85 year old female with head and neck injury following fall. EXAM: CT HEAD WITHOUT CONTRAST CT CERVICAL SPINE WITHOUT CONTRAST TECHNIQUE: Multidetector CT imaging of the head and cervical spine was performed following the standard protocol without  intravenous contrast. Multiplanar CT image reconstructions of the cervical spine were also generated. COMPARISON:  07/16/2018 and prior CTs FINDINGS: CT HEAD FINDINGS Brain: No evidence of acute infarction, hemorrhage, hydrocephalus, extra-axial collection or mass lesion/mass effect. Atrophy and chronic small-vessel white matter ischemic changes again noted. Vascular: Carotid atherosclerotic calcifications again noted. Skull: No acute abnormality or suspicious focal lesion. A remote RIGHT zygoma fracture is noted. Sinuses/Orbits: No acute finding. Other: None. CT CERVICAL SPINE FINDINGS Alignment: Normal. Skull base and vertebrae: No acute fracture. No primary bone lesion or focal pathologic process. Soft tissues and spinal canal: No  prevertebral fluid or swelling. No visible canal hematoma. Disc levels: Mild moderate multilevel degenerative disc disease/spondylosis noted, greatest at C5-6. Moderate multilevel facet arthropathy identified. Upper chest: No acute abnormality Other: None IMPRESSION: 1. No evidence of acute intracranial abnormality. Atrophy and chronic small-vessel white matter ischemic changes. 2. No static evidence of acute injury to the cervical spine. Multilevel degenerative changes. Electronically Signed   By: Margarette Canada M.D.   On: 05/08/2019 14:37   Ct Cervical Spine Wo Contrast  Result Date: 05/20/2019 CLINICAL DATA:  Fall neck pain and altered mental status EXAM: CT HEAD WITHOUT CONTRAST CT CERVICAL SPINE WITHOUT CONTRAST TECHNIQUE: Multidetector CT imaging of the head and cervical spine was performed following the standard protocol without intravenous contrast. Multiplanar CT image reconstructions of the cervical spine were also generated. COMPARISON:  CT head and cervical spine 05/08/2019 FINDINGS: CT HEAD FINDINGS Brain: There is no mass, hemorrhage or extra-axial collection. There is generalized atrophy without lobar predilection. There is hypoattenuation of the periventricular white  matter, most commonly indicating chronic ischemic microangiopathy. Vascular: No abnormal hyperdensity of the major intracranial arteries or dural venous sinuses. No intracranial atherosclerosis. Skull: The visualized skull base, calvarium and extracranial soft tissues are normal. Sinuses/Orbits: No fluid levels or advanced mucosal thickening of the visualized paranasal sinuses. No mastoid or middle ear effusion. The orbits are normal. CT CERVICAL SPINE FINDINGS Alignment: Grade 1 anterolisthesis at C4-5. Skull base and vertebrae: No acute fracture. Soft tissues and spinal canal: No prevertebral fluid or swelling. No visible canal hematoma. Disc levels: Multilevel moderate-to-severe facet hypertrophy. Upper chest: No pneumothorax, pulmonary nodule or pleural effusion. Other: Normal visualized paraspinal cervical soft tissues. IMPRESSION: 1. No acute abnormality of the head or cervical spine. 2. Chronic ischemic microangiopathy and generalized volume loss. 3. Multilevel cervical facet arthrosis with grade 1 anterolisthesis at C4-5, unchanged. Electronically Signed   By: Ulyses Jarred M.D.   On: 05/20/2019 20:30   Ct Cervical Spine Wo Contrast  Result Date: 05/08/2019 CLINICAL DATA:  85 year old female with head and neck injury following fall. EXAM: CT HEAD WITHOUT CONTRAST CT CERVICAL SPINE WITHOUT CONTRAST TECHNIQUE: Multidetector CT imaging of the head and cervical spine was performed following the standard protocol without intravenous contrast. Multiplanar CT image reconstructions of the cervical spine were also generated. COMPARISON:  07/16/2018 and prior CTs FINDINGS: CT HEAD FINDINGS Brain: No evidence of acute infarction, hemorrhage, hydrocephalus, extra-axial collection or mass lesion/mass effect. Atrophy and chronic small-vessel white matter ischemic changes again noted. Vascular: Carotid atherosclerotic calcifications again noted. Skull: No acute abnormality or suspicious focal lesion. A remote RIGHT  zygoma fracture is noted. Sinuses/Orbits: No acute finding. Other: None. CT CERVICAL SPINE FINDINGS Alignment: Normal. Skull base and vertebrae: No acute fracture. No primary bone lesion or focal pathologic process. Soft tissues and spinal canal: No prevertebral fluid or swelling. No visible canal hematoma. Disc levels: Mild moderate multilevel degenerative disc disease/spondylosis noted, greatest at C5-6. Moderate multilevel facet arthropathy identified. Upper chest: No acute abnormality Other: None IMPRESSION: 1. No evidence of acute intracranial abnormality. Atrophy and chronic small-vessel white matter ischemic changes. 2. No static evidence of acute injury to the cervical spine. Multilevel degenerative changes. Electronically Signed   By: Margarette Canada M.D.   On: 05/08/2019 14:37   Ct Abdomen Pelvis W Contrast  Result Date: 05/20/2019 CLINICAL DATA:  Fall several days ago with persistent weakness and abdominal pain EXAM: CT ABDOMEN AND PELVIS WITH CONTRAST TECHNIQUE: Multidetector CT imaging of the abdomen and pelvis was performed using  the standard protocol following bolus administration of intravenous contrast. CONTRAST:  131mL OMNIPAQUE IOHEXOL 300 MG/ML  SOLN COMPARISON:  None. FINDINGS: Lower chest: No acute abnormality. Hepatobiliary: No focal liver abnormality is seen. No gallstones, gallbladder wall thickening, or biliary dilatation. Pancreas: Unremarkable. No pancreatic ductal dilatation or surrounding inflammatory changes. Spleen: Normal in size without focal abnormality. Adrenals/Urinary Tract: Adrenal glands are within normal limits. The kidneys are well visualized bilaterally. No renal calculi or obstructive changes are seen. Normal excretion is noted bilaterally. Stomach/Bowel: Mild diverticular changes noted. There are changes suggestive of prior appendectomy. Clinical correlation is recommended. No inflammatory changes are seen. The stomach and small bowel appear within normal limits.  Vascular/Lymphatic: Aortic atherosclerosis. No enlarged abdominal or pelvic lymph nodes. Reproductive: Status post hysterectomy. No adnexal masses. Other: No abdominal wall hernia or abnormality. No abdominopelvic ascites. Musculoskeletal: Postsurgical changes are noted in the right hip. No acute bony abnormality is noted. IMPRESSION: Diverticulosis without diverticulitis. No acute abnormality noted. Electronically Signed   By: Inez Catalina M.D.   On: 05/20/2019 20:29   Ct Maxillofacial W Contrast  Result Date: 05/21/2019 CLINICAL DATA:  Oral cavity pain with bleeding and foul odor. EXAM: CT MAXILLOFACIAL WITH CONTRAST TECHNIQUE: Multidetector CT imaging of the maxillofacial structures was performed with intravenous contrast. Multiplanar CT image reconstructions were also generated. CONTRAST:  51mL OMNIPAQUE IOHEXOL 300 MG/ML  SOLN COMPARISON:  None. FINDINGS: The study is mildly motion degraded. Osseous: No acute fracture is identified within limitations of motion artifact. No destructive osseous lesion. No mandibular dislocation. Orbits: Bilateral cataract extraction. No acute inflammatory or traumatic findings. Sinuses: Paranasal sinuses and mastoid air cells are clear. Soft tissues: No oral cavity or pharyngeal mass identified within limitations of dental streak artifact. No peritonsillar or retropharyngeal fluid collection. No parotid or submandibular space inflammation. Calcified atherosclerosis at the carotid bifurcations without evidence of significant stenosis. Limited intracranial: The brain is more fully evaluated on separate dedicated head CT. No abnormal intracranial enhancement is identified. IMPRESSION: No acute abnormality identified. Electronically Signed   By: Logan Bores M.D.   On: 05/21/2019 14:43   Dg Chest Port 1 View  Result Date: 05/28/2019 CLINICAL DATA:  Dyspnea EXAM: PORTABLE CHEST 1 VIEW COMPARISON:  05/20/2019 chest radiograph. FINDINGS: Stable configuration of 2 lead left  subclavian pacemaker. Stable cardiomediastinal silhouette with normal heart size. No pneumothorax. No pleural effusion. Lungs appear clear, with no acute consolidative airspace disease and no pulmonary edema. IMPRESSION: No active disease. Electronically Signed   By: Ilona Sorrel M.D.   On: 05/28/2019 08:33   Dg Chest Port 1 View  Result Date: 05/20/2019 CLINICAL DATA:  Altered mental status. EXAM: PORTABLE CHEST 1 VIEW COMPARISON:  Radiograph of May 08, 2019. FINDINGS: The heart size and mediastinal contours are within normal limits. Both lungs are clear. No pneumothorax or pleural effusion is noted. Left-sided pacemaker is unchanged in position. The visualized skeletal structures are unremarkable. IMPRESSION: No active disease. Electronically Signed   By: Marijo Conception M.D.   On: 05/20/2019 16:46   Dg Hips Bilat W Or Wo Pelvis 2 Views  Result Date: 05/08/2019 CLINICAL DATA:  Unwitnessed fall. EXAM: DG HIP (WITH OR WITHOUT PELVIS) 2V BILAT COMPARISON:  Radiographs of July 16, 2018. FINDINGS: There is no evidence of acute hip fracture or dislocation. There is no evidence of arthropathy. Status post surgical repair of old right proximal femoral fracture. IMPRESSION: No acute abnormality seen involving either hip. Electronically Signed   By: Bobbe Medico.D.  On: 05/08/2019 14:22

## 2019-05-29 NOTE — Progress Notes (Addendum)
0803  Resting in bed.  Confused.  O2 sat 100% on room air.  0848  Patient c/o headache.  Tylenol given  [phone]  Assisted with breakfast.  Ate approximately 25% with lots of encouragement.  Intake 240 ml liquids.  Much more cooperative today.    1340  Attempted to assist with lunch.  She is very agitated and combative.  Throwing things, slapping at staff.  Refused to take antibiotics.  Reiterated in multiple ways about a bladder infection and how important it is to take the medications.  Patient states "I don't care".  1745  Attempted to assist with dinner.  She became agitated and combative.  Stated she "didn't want any of it."

## 2019-05-30 DIAGNOSIS — G309 Alzheimer's disease, unspecified: Secondary | ICD-10-CM | POA: Diagnosis present

## 2019-05-30 DIAGNOSIS — Z87440 Personal history of urinary (tract) infections: Secondary | ICD-10-CM | POA: Diagnosis not present

## 2019-05-30 DIAGNOSIS — N179 Acute kidney failure, unspecified: Secondary | ICD-10-CM | POA: Diagnosis present

## 2019-05-30 DIAGNOSIS — N390 Urinary tract infection, site not specified: Secondary | ICD-10-CM | POA: Diagnosis present

## 2019-05-30 DIAGNOSIS — E876 Hypokalemia: Secondary | ICD-10-CM | POA: Diagnosis not present

## 2019-05-30 DIAGNOSIS — F0280 Dementia in other diseases classified elsewhere without behavioral disturbance: Secondary | ICD-10-CM | POA: Diagnosis present

## 2019-05-30 DIAGNOSIS — R410 Disorientation, unspecified: Secondary | ICD-10-CM | POA: Diagnosis not present

## 2019-05-30 DIAGNOSIS — G9340 Encephalopathy, unspecified: Secondary | ICD-10-CM | POA: Diagnosis not present

## 2019-05-30 DIAGNOSIS — E860 Dehydration: Secondary | ICD-10-CM | POA: Diagnosis present

## 2019-05-30 DIAGNOSIS — I495 Sick sinus syndrome: Secondary | ICD-10-CM | POA: Diagnosis present

## 2019-05-30 DIAGNOSIS — U071 COVID-19: Secondary | ICD-10-CM | POA: Diagnosis present

## 2019-05-30 DIAGNOSIS — A4151 Sepsis due to Escherichia coli [E. coli]: Secondary | ICD-10-CM | POA: Diagnosis present

## 2019-05-30 DIAGNOSIS — Z7984 Long term (current) use of oral hypoglycemic drugs: Secondary | ICD-10-CM | POA: Diagnosis not present

## 2019-05-30 DIAGNOSIS — Z79899 Other long term (current) drug therapy: Secondary | ICD-10-CM | POA: Diagnosis not present

## 2019-05-30 DIAGNOSIS — Z7951 Long term (current) use of inhaled steroids: Secondary | ICD-10-CM | POA: Diagnosis not present

## 2019-05-30 DIAGNOSIS — I444 Left anterior fascicular block: Secondary | ICD-10-CM | POA: Diagnosis not present

## 2019-05-30 DIAGNOSIS — R4182 Altered mental status, unspecified: Secondary | ICD-10-CM | POA: Diagnosis not present

## 2019-05-30 DIAGNOSIS — B9620 Unspecified Escherichia coli [E. coli] as the cause of diseases classified elsewhere: Secondary | ICD-10-CM | POA: Diagnosis present

## 2019-05-30 DIAGNOSIS — R402411 Glasgow coma scale score 13-15, in the field [EMT or ambulance]: Secondary | ICD-10-CM | POA: Diagnosis not present

## 2019-05-30 DIAGNOSIS — E870 Hyperosmolality and hypernatremia: Secondary | ICD-10-CM | POA: Diagnosis present

## 2019-05-30 DIAGNOSIS — M2550 Pain in unspecified joint: Secondary | ICD-10-CM | POA: Diagnosis not present

## 2019-05-30 DIAGNOSIS — Z7401 Bed confinement status: Secondary | ICD-10-CM | POA: Diagnosis not present

## 2019-05-30 DIAGNOSIS — M436 Torticollis: Secondary | ICD-10-CM | POA: Diagnosis present

## 2019-05-30 DIAGNOSIS — E43 Unspecified severe protein-calorie malnutrition: Secondary | ICD-10-CM | POA: Diagnosis present

## 2019-05-30 DIAGNOSIS — J449 Chronic obstructive pulmonary disease, unspecified: Secondary | ICD-10-CM | POA: Diagnosis present

## 2019-05-30 DIAGNOSIS — E119 Type 2 diabetes mellitus without complications: Secondary | ICD-10-CM | POA: Diagnosis present

## 2019-05-30 DIAGNOSIS — I2510 Atherosclerotic heart disease of native coronary artery without angina pectoris: Secondary | ICD-10-CM | POA: Diagnosis present

## 2019-05-30 DIAGNOSIS — I214 Non-ST elevation (NSTEMI) myocardial infarction: Secondary | ICD-10-CM | POA: Diagnosis present

## 2019-05-30 DIAGNOSIS — R5381 Other malaise: Secondary | ICD-10-CM | POA: Diagnosis not present

## 2019-05-30 DIAGNOSIS — G9341 Metabolic encephalopathy: Secondary | ICD-10-CM | POA: Diagnosis present

## 2019-05-30 DIAGNOSIS — Z950 Presence of cardiac pacemaker: Secondary | ICD-10-CM | POA: Diagnosis not present

## 2019-05-30 DIAGNOSIS — Z20828 Contact with and (suspected) exposure to other viral communicable diseases: Secondary | ICD-10-CM | POA: Diagnosis not present

## 2019-05-30 DIAGNOSIS — I440 Atrioventricular block, first degree: Secondary | ICD-10-CM | POA: Diagnosis not present

## 2019-05-30 DIAGNOSIS — Z66 Do not resuscitate: Secondary | ICD-10-CM | POA: Diagnosis present

## 2019-05-30 LAB — D-DIMER, QUANTITATIVE: D-Dimer, Quant: 1.32 ug/mL-FEU — ABNORMAL HIGH (ref 0.00–0.50)

## 2019-05-30 LAB — CBC WITH DIFFERENTIAL/PLATELET
Abs Immature Granulocytes: 0.04 10*3/uL (ref 0.00–0.07)
Basophils Absolute: 0 10*3/uL (ref 0.0–0.1)
Basophils Relative: 0 %
Eosinophils Absolute: 0.1 10*3/uL (ref 0.0–0.5)
Eosinophils Relative: 1 %
HCT: 28.9 % — ABNORMAL LOW (ref 36.0–46.0)
Hemoglobin: 9.6 g/dL — ABNORMAL LOW (ref 12.0–15.0)
Immature Granulocytes: 1 %
Lymphocytes Relative: 32 %
Lymphs Abs: 2.7 10*3/uL (ref 0.7–4.0)
MCH: 29.6 pg (ref 26.0–34.0)
MCHC: 33.2 g/dL (ref 30.0–36.0)
MCV: 89.2 fL (ref 80.0–100.0)
Monocytes Absolute: 0.6 10*3/uL (ref 0.1–1.0)
Monocytes Relative: 7 %
Neutro Abs: 5 10*3/uL (ref 1.7–7.7)
Neutrophils Relative %: 59 %
Platelets: 471 10*3/uL — ABNORMAL HIGH (ref 150–400)
RBC: 3.24 MIL/uL — ABNORMAL LOW (ref 3.87–5.11)
RDW: 12.7 % (ref 11.5–15.5)
WBC: 8.5 10*3/uL (ref 4.0–10.5)
nRBC: 0 % (ref 0.0–0.2)

## 2019-05-30 LAB — MAGNESIUM: Magnesium: 1.9 mg/dL (ref 1.7–2.4)

## 2019-05-30 LAB — COMPREHENSIVE METABOLIC PANEL
ALT: 15 U/L (ref 0–44)
AST: 17 U/L (ref 15–41)
Albumin: 2.7 g/dL — ABNORMAL LOW (ref 3.5–5.0)
Alkaline Phosphatase: 73 U/L (ref 38–126)
Anion gap: 9 (ref 5–15)
BUN: 13 mg/dL (ref 8–23)
CO2: 23 mmol/L (ref 22–32)
Calcium: 8.1 mg/dL — ABNORMAL LOW (ref 8.9–10.3)
Chloride: 105 mmol/L (ref 98–111)
Creatinine, Ser: 0.7 mg/dL (ref 0.44–1.00)
GFR calc Af Amer: >60 mL/min (ref >60)
GFR calc non Af Amer: >60 mL/min (ref >60)
Glucose, Bld: 123 mg/dL — ABNORMAL HIGH (ref 70–99)
Potassium: 3.6 mmol/L (ref 3.5–5.1)
Sodium: 137 mmol/L (ref 135–145)
Total Bilirubin: 0.4 mg/dL (ref 0.3–1.2)
Total Protein: 5.5 g/dL — ABNORMAL LOW (ref 6.5–8.1)

## 2019-05-30 LAB — URINE CULTURE: Culture: >=100000 — AB

## 2019-05-30 LAB — GLUCOSE, CAPILLARY
Glucose-Capillary: 120 mg/dL — ABNORMAL HIGH (ref 70–99)
Glucose-Capillary: 137 mg/dL — ABNORMAL HIGH (ref 70–99)

## 2019-05-30 LAB — FERRITIN: Ferritin: 324 ng/mL — ABNORMAL HIGH (ref 11–307)

## 2019-05-30 LAB — C-REACTIVE PROTEIN: CRP: 5 mg/dL — ABNORMAL HIGH (ref <1.0)

## 2019-05-30 LAB — LACTATE DEHYDROGENASE: LDH: 177 U/L (ref 98–192)

## 2019-05-30 LAB — PROCALCITONIN: Procalcitonin: 0.61 ng/mL

## 2019-05-30 MED ORDER — CEPHALEXIN 500 MG PO CAPS: 500.0000 mg | ORAL_CAPSULE | Freq: Three times a day (TID) | ORAL | 0 refills | Status: AC

## 2019-05-30 NOTE — Progress Notes (Signed)
Bladder scan preformed = 324ml. MD informed. New orders for indwelling foley catheter due to acute urinary retention.  Indwelling catheter inserted via sterile technique. Peri care preformed prior to insertion.  Insertion attempts x1. Pt tolerated well.

## 2019-05-30 NOTE — TOC Transition Note (Addendum)
Transition of Care [facility]) - CM/SW Discharge Note   Patient Details  Name: Melissa Fischer MRN: [id number] Date of Birth: 01/11/1934  [facility] CM/SW Contact:  Alberteen Sam, [place] Phone Number: [phone] 05/30/2019, 10:12 AM   Clinical Narrative:      Patient will DC to: [place] date: 05/30/2019 Family notified: [name] Transport [id number]  Per MD patient ready for DC to [facility] . RN, patient, patient's family, and facility notified of DC. Discharge Summary sent to facility. RN given number for report [phone]. DC packet on chart. Ambulance transport requested for patient for 12:00 pm per nurse request.  [place] signing off.  [place], [place]  Final next level of care: Skilled Nursing Facility Barriers to Discharge: No Barriers Identified   Patient Goals and CMS Choice Patient states their goals for this hospitalization and ongoing recovery are:: patient unable to participate in goal setting at this time; patient's daughter hopeful that she will bounce back and be able to return to [employer].gov Compare Post Acute Care list provided to:: Patient Represenative (must comment)(Karen (daughter)) Choice offered to / list presented to : Adult Children  Discharge Placement PASRR number recieved: 05/24/19            Patient chooses bed at: [facility] [place] Patient to be transferred to facility by: [place] Name of family member notified: [name] Patient and family notified of of transfer: 05/27/19  Discharge Plan and Services                                     Social Determinants of Health (SDOH) Interventions     Readmission Risk Interventions No flowsheet data found.

## 2019-05-30 NOTE — Progress Notes (Signed)
Spoke with Beverely Low at facility who transferred me [facility] RN

## 2019-05-30 NOTE — Progress Notes (Signed)
[facility] arrived to pick up pt to take to [facility]. Report attemted to be given to facility.

## 2019-05-31 DIAGNOSIS — U071 COVID-19: Secondary | ICD-10-CM | POA: Diagnosis not present

## 2019-05-31 DIAGNOSIS — R5381 Other malaise: Secondary | ICD-10-CM | POA: Diagnosis not present

## 2019-06-02 DIAGNOSIS — R5381 Other malaise: Secondary | ICD-10-CM | POA: Diagnosis not present

## 2019-06-02 DIAGNOSIS — U071 COVID-19: Secondary | ICD-10-CM | POA: Diagnosis not present

## 2019-06-06 DIAGNOSIS — U071 COVID-19: Secondary | ICD-10-CM | POA: Diagnosis not present

## 2019-06-06 DIAGNOSIS — R5381 Other malaise: Secondary | ICD-10-CM | POA: Diagnosis not present

## 2019-06-09 DIAGNOSIS — U071 COVID-19: Secondary | ICD-10-CM | POA: Diagnosis not present

## 2019-06-09 DIAGNOSIS — R5381 Other malaise: Secondary | ICD-10-CM | POA: Diagnosis not present

## 2019-06-15 DIAGNOSIS — U071 COVID-19: Secondary | ICD-10-CM | POA: Diagnosis not present

## 2019-06-15 DIAGNOSIS — R5381 Other malaise: Secondary | ICD-10-CM | POA: Diagnosis not present

## 2019-06-20 DIAGNOSIS — R1312 Dysphagia, oropharyngeal phase: Secondary | ICD-10-CM | POA: Diagnosis not present

## 2019-06-20 DIAGNOSIS — F0281 Dementia in other diseases classified elsewhere with behavioral disturbance: Secondary | ICD-10-CM | POA: Diagnosis not present

## 2019-06-20 DIAGNOSIS — U071 COVID-19: Secondary | ICD-10-CM | POA: Diagnosis present

## 2019-06-20 DIAGNOSIS — R9431 Abnormal electrocardiogram [ECG] [EKG]: Secondary | ICD-10-CM | POA: Diagnosis not present

## 2019-06-20 DIAGNOSIS — I2510 Atherosclerotic heart disease of native coronary artery without angina pectoris: Secondary | ICD-10-CM | POA: Diagnosis present

## 2019-06-20 DIAGNOSIS — I444 Left anterior fascicular block: Secondary | ICD-10-CM | POA: Diagnosis not present

## 2019-06-20 DIAGNOSIS — Z7984 Long term (current) use of oral hypoglycemic drugs: Secondary | ICD-10-CM | POA: Diagnosis not present

## 2019-06-20 DIAGNOSIS — R2689 Other abnormalities of gait and mobility: Secondary | ICD-10-CM | POA: Diagnosis not present

## 2019-06-20 DIAGNOSIS — A4151 Sepsis due to Escherichia coli [E. coli]: Secondary | ICD-10-CM | POA: Diagnosis not present

## 2019-06-20 DIAGNOSIS — E46 Unspecified protein-calorie malnutrition: Secondary | ICD-10-CM | POA: Diagnosis not present

## 2019-06-20 DIAGNOSIS — R410 Disorientation, unspecified: Secondary | ICD-10-CM | POA: Diagnosis not present

## 2019-06-20 DIAGNOSIS — E119 Type 2 diabetes mellitus without complications: Secondary | ICD-10-CM | POA: Diagnosis present

## 2019-06-20 DIAGNOSIS — B9620 Unspecified Escherichia coli [E. coli] as the cause of diseases classified elsewhere: Secondary | ICD-10-CM | POA: Diagnosis present

## 2019-06-20 DIAGNOSIS — F0280 Dementia in other diseases classified elsewhere without behavioral disturbance: Secondary | ICD-10-CM | POA: Diagnosis present

## 2019-06-20 DIAGNOSIS — Z950 Presence of cardiac pacemaker: Secondary | ICD-10-CM | POA: Diagnosis not present

## 2019-06-20 DIAGNOSIS — N179 Acute kidney failure, unspecified: Secondary | ICD-10-CM | POA: Diagnosis present

## 2019-06-20 DIAGNOSIS — G309 Alzheimer's disease, unspecified: Secondary | ICD-10-CM | POA: Diagnosis present

## 2019-06-20 DIAGNOSIS — E870 Hyperosmolality and hypernatremia: Secondary | ICD-10-CM | POA: Diagnosis present

## 2019-06-20 DIAGNOSIS — I440 Atrioventricular block, first degree: Secondary | ICD-10-CM | POA: Diagnosis not present

## 2019-06-20 DIAGNOSIS — Z87440 Personal history of urinary (tract) infections: Secondary | ICD-10-CM | POA: Diagnosis not present

## 2019-06-20 DIAGNOSIS — Z66 Do not resuscitate: Secondary | ICD-10-CM | POA: Diagnosis present

## 2019-06-20 DIAGNOSIS — M6281 Muscle weakness (generalized): Secondary | ICD-10-CM | POA: Diagnosis not present

## 2019-06-20 DIAGNOSIS — M436 Torticollis: Secondary | ICD-10-CM | POA: Diagnosis present

## 2019-06-20 DIAGNOSIS — F068 Other specified mental disorders due to known physiological condition: Secondary | ICD-10-CM | POA: Diagnosis not present

## 2019-06-20 DIAGNOSIS — E876 Hypokalemia: Secondary | ICD-10-CM | POA: Diagnosis not present

## 2019-06-20 DIAGNOSIS — J449 Chronic obstructive pulmonary disease, unspecified: Secondary | ICD-10-CM | POA: Diagnosis present

## 2019-06-20 DIAGNOSIS — E860 Dehydration: Secondary | ICD-10-CM | POA: Diagnosis present

## 2019-06-20 DIAGNOSIS — Z7951 Long term (current) use of inhaled steroids: Secondary | ICD-10-CM | POA: Diagnosis not present

## 2019-06-20 DIAGNOSIS — G9341 Metabolic encephalopathy: Secondary | ICD-10-CM | POA: Diagnosis present

## 2019-06-20 DIAGNOSIS — Z79899 Other long term (current) drug therapy: Secondary | ICD-10-CM | POA: Diagnosis not present

## 2019-06-20 DIAGNOSIS — G92 Toxic encephalopathy: Secondary | ICD-10-CM | POA: Diagnosis not present

## 2019-06-20 DIAGNOSIS — N390 Urinary tract infection, site not specified: Secondary | ICD-10-CM | POA: Diagnosis present

## 2019-06-20 DIAGNOSIS — E43 Unspecified severe protein-calorie malnutrition: Secondary | ICD-10-CM | POA: Diagnosis not present

## 2019-06-20 DIAGNOSIS — Z9181 History of falling: Secondary | ICD-10-CM | POA: Diagnosis not present

## 2019-06-20 DIAGNOSIS — R4182 Altered mental status, unspecified: Secondary | ICD-10-CM | POA: Diagnosis not present

## 2019-06-20 DIAGNOSIS — I214 Non-ST elevation (NSTEMI) myocardial infarction: Secondary | ICD-10-CM | POA: Diagnosis not present

## 2019-06-20 DIAGNOSIS — I495 Sick sinus syndrome: Secondary | ICD-10-CM | POA: Diagnosis present

## 2019-06-28 DIAGNOSIS — N179 Acute kidney failure, unspecified: Secondary | ICD-10-CM | POA: Diagnosis not present

## 2019-06-28 DIAGNOSIS — R451 Restlessness and agitation: Secondary | ICD-10-CM | POA: Diagnosis not present

## 2019-06-28 DIAGNOSIS — G9341 Metabolic encephalopathy: Secondary | ICD-10-CM | POA: Diagnosis not present

## 2019-06-28 DIAGNOSIS — R2689 Other abnormalities of gait and mobility: Secondary | ICD-10-CM | POA: Diagnosis not present

## 2019-06-28 DIAGNOSIS — R1312 Dysphagia, oropharyngeal phase: Secondary | ICD-10-CM | POA: Diagnosis not present

## 2019-06-28 DIAGNOSIS — R410 Disorientation, unspecified: Secondary | ICD-10-CM | POA: Diagnosis not present

## 2019-06-28 DIAGNOSIS — E1165 Type 2 diabetes mellitus with hyperglycemia: Secondary | ICD-10-CM | POA: Diagnosis not present

## 2019-06-28 DIAGNOSIS — J449 Chronic obstructive pulmonary disease, unspecified: Secondary | ICD-10-CM | POA: Diagnosis not present

## 2019-06-28 DIAGNOSIS — G92 Toxic encephalopathy: Secondary | ICD-10-CM | POA: Diagnosis not present

## 2019-06-28 DIAGNOSIS — I214 Non-ST elevation (NSTEMI) myocardial infarction: Secondary | ICD-10-CM | POA: Diagnosis not present

## 2019-06-28 DIAGNOSIS — F419 Anxiety disorder, unspecified: Secondary | ICD-10-CM | POA: Diagnosis not present

## 2019-06-28 DIAGNOSIS — M6281 Muscle weakness (generalized): Secondary | ICD-10-CM | POA: Diagnosis not present

## 2019-06-28 DIAGNOSIS — E46 Unspecified protein-calorie malnutrition: Secondary | ICD-10-CM | POA: Diagnosis not present

## 2019-06-28 DIAGNOSIS — E870 Hyperosmolality and hypernatremia: Secondary | ICD-10-CM | POA: Diagnosis not present

## 2019-06-28 DIAGNOSIS — E119 Type 2 diabetes mellitus without complications: Secondary | ICD-10-CM | POA: Diagnosis not present

## 2019-06-28 DIAGNOSIS — Z9181 History of falling: Secondary | ICD-10-CM | POA: Diagnosis not present

## 2019-06-28 DIAGNOSIS — F0281 Dementia in other diseases classified elsewhere with behavioral disturbance: Secondary | ICD-10-CM | POA: Diagnosis not present

## 2019-06-28 DIAGNOSIS — F068 Other specified mental disorders due to known physiological condition: Secondary | ICD-10-CM | POA: Diagnosis not present

## 2019-06-28 DIAGNOSIS — U071 COVID-19: Secondary | ICD-10-CM | POA: Diagnosis not present

## 2019-06-28 DIAGNOSIS — E43 Unspecified severe protein-calorie malnutrition: Secondary | ICD-10-CM | POA: Diagnosis not present

## 2019-06-30 DIAGNOSIS — R451 Restlessness and agitation: Secondary | ICD-10-CM | POA: Diagnosis not present

## 2019-06-30 DIAGNOSIS — F0281 Dementia in other diseases classified elsewhere with behavioral disturbance: Secondary | ICD-10-CM | POA: Diagnosis not present

## 2019-06-30 DIAGNOSIS — F419 Anxiety disorder, unspecified: Secondary | ICD-10-CM | POA: Diagnosis not present

## 2019-07-07 DIAGNOSIS — E1165 Type 2 diabetes mellitus with hyperglycemia: Secondary | ICD-10-CM | POA: Diagnosis not present

## 2019-07-07 DIAGNOSIS — I214 Non-ST elevation (NSTEMI) myocardial infarction: Secondary | ICD-10-CM | POA: Diagnosis not present

## 2019-07-07 DIAGNOSIS — E870 Hyperosmolality and hypernatremia: Secondary | ICD-10-CM | POA: Diagnosis not present

## 2019-07-07 DIAGNOSIS — G9341 Metabolic encephalopathy: Secondary | ICD-10-CM | POA: Diagnosis not present

## 2019-07-31 DIAGNOSIS — R451 Restlessness and agitation: Secondary | ICD-10-CM | POA: Diagnosis not present

## 2019-08-03 DIAGNOSIS — I10 Essential (primary) hypertension: Secondary | ICD-10-CM | POA: Diagnosis not present

## 2019-08-03 DIAGNOSIS — N390 Urinary tract infection, site not specified: Secondary | ICD-10-CM | POA: Diagnosis not present

## 2019-08-03 DIAGNOSIS — F0280 Dementia in other diseases classified elsewhere without behavioral disturbance: Secondary | ICD-10-CM | POA: Diagnosis not present

## 2019-08-03 DIAGNOSIS — E1142 Type 2 diabetes mellitus with diabetic polyneuropathy: Secondary | ICD-10-CM | POA: Diagnosis not present

## 2019-08-03 DIAGNOSIS — J449 Chronic obstructive pulmonary disease, unspecified: Secondary | ICD-10-CM | POA: Diagnosis not present

## 2019-08-03 DIAGNOSIS — U071 COVID-19: Secondary | ICD-10-CM | POA: Diagnosis not present

## 2019-08-03 DIAGNOSIS — G309 Alzheimer's disease, unspecified: Secondary | ICD-10-CM | POA: Diagnosis not present

## 2019-08-03 DIAGNOSIS — Z6823 Body mass index (BMI) 23.0-23.9, adult: Secondary | ICD-10-CM | POA: Diagnosis not present

## 2019-08-03 DIAGNOSIS — Z741 Need for assistance with personal care: Secondary | ICD-10-CM | POA: Diagnosis not present

## 2019-08-04 DIAGNOSIS — J449 Chronic obstructive pulmonary disease, unspecified: Secondary | ICD-10-CM | POA: Diagnosis not present

## 2019-08-04 DIAGNOSIS — E1142 Type 2 diabetes mellitus with diabetic polyneuropathy: Secondary | ICD-10-CM | POA: Diagnosis not present

## 2019-08-04 DIAGNOSIS — F0280 Dementia in other diseases classified elsewhere without behavioral disturbance: Secondary | ICD-10-CM | POA: Diagnosis not present

## 2019-08-04 DIAGNOSIS — I10 Essential (primary) hypertension: Secondary | ICD-10-CM | POA: Diagnosis not present

## 2019-08-04 DIAGNOSIS — G309 Alzheimer's disease, unspecified: Secondary | ICD-10-CM | POA: Diagnosis not present

## 2019-08-04 DIAGNOSIS — U071 COVID-19: Secondary | ICD-10-CM | POA: Diagnosis not present

## 2019-08-30 DEATH — deceased

## 2019-12-08 ENCOUNTER — Ambulatory Visit: Payer: Medicare Other | Admitting: Adult Health

## 2020-10-13 IMAGING — CR DG HIP (WITH OR WITHOUT PELVIS) 2V BILAT
5 series · 5 of 5 positions shown · non-contrast
Comparison: Radiographs July 16, 2018.

CLINICAL DATA: Unwitnessed fall.

EXAM:
DG HIP (WITH OR WITHOUT PELVIS) 2V BILAT

[x pelvis]
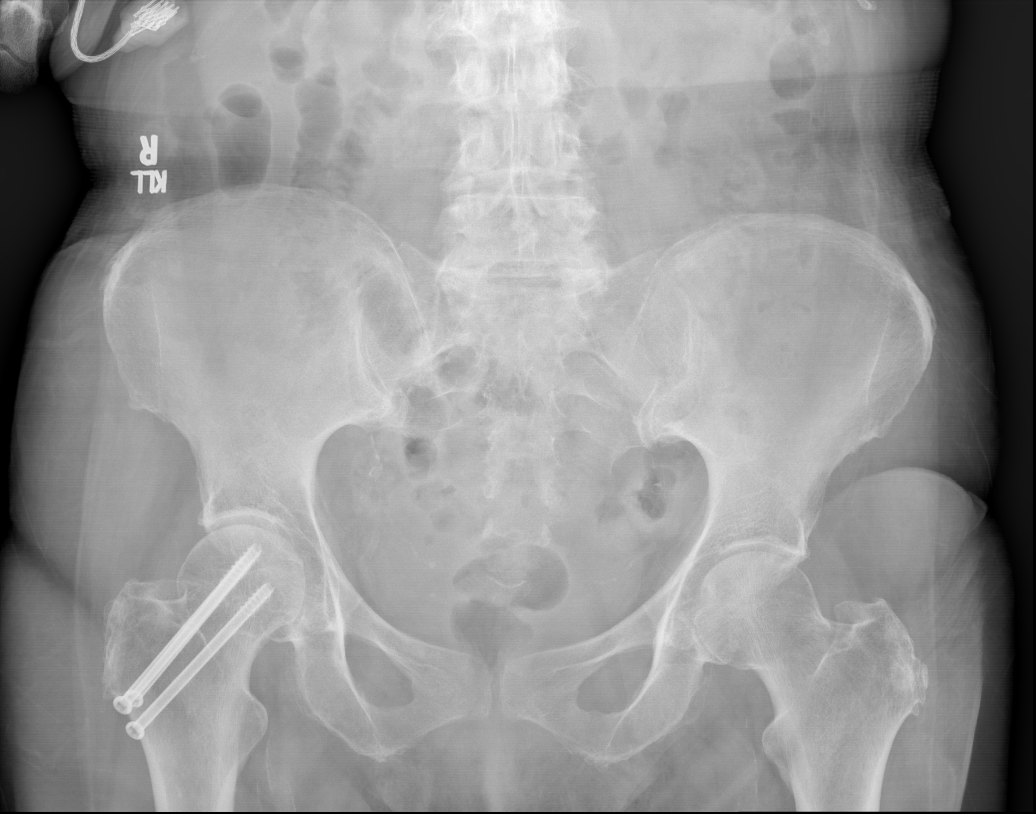

[x hip ap left]
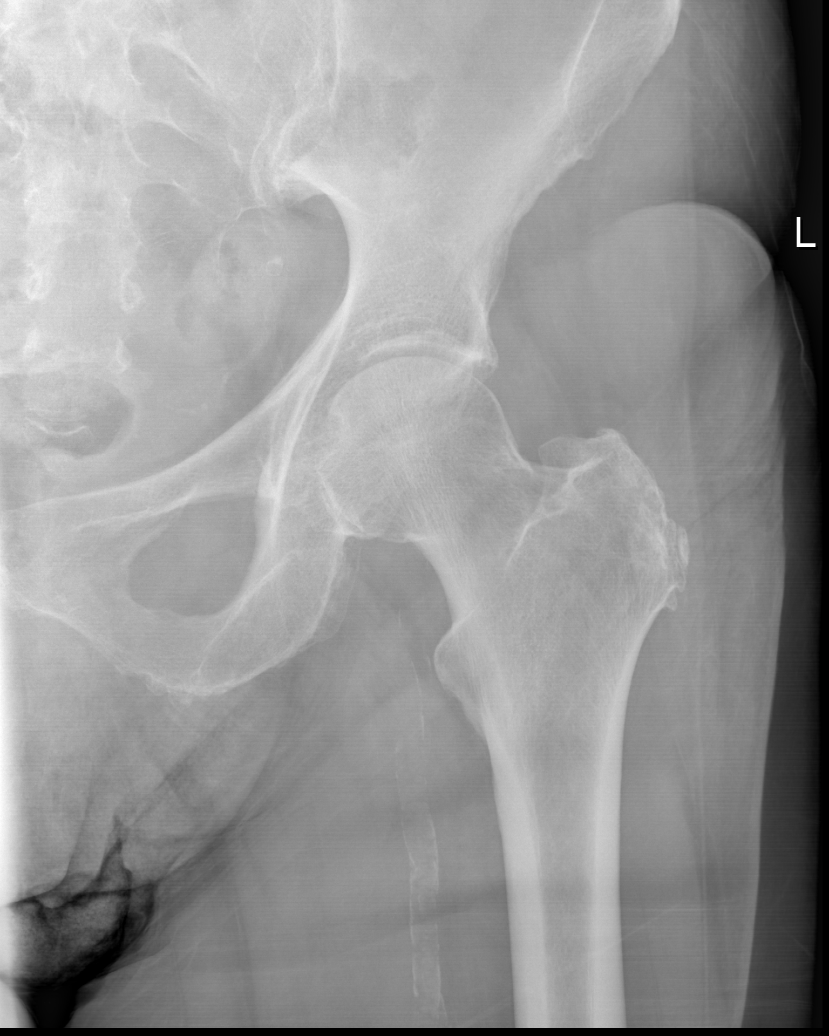

[x hip lat left]
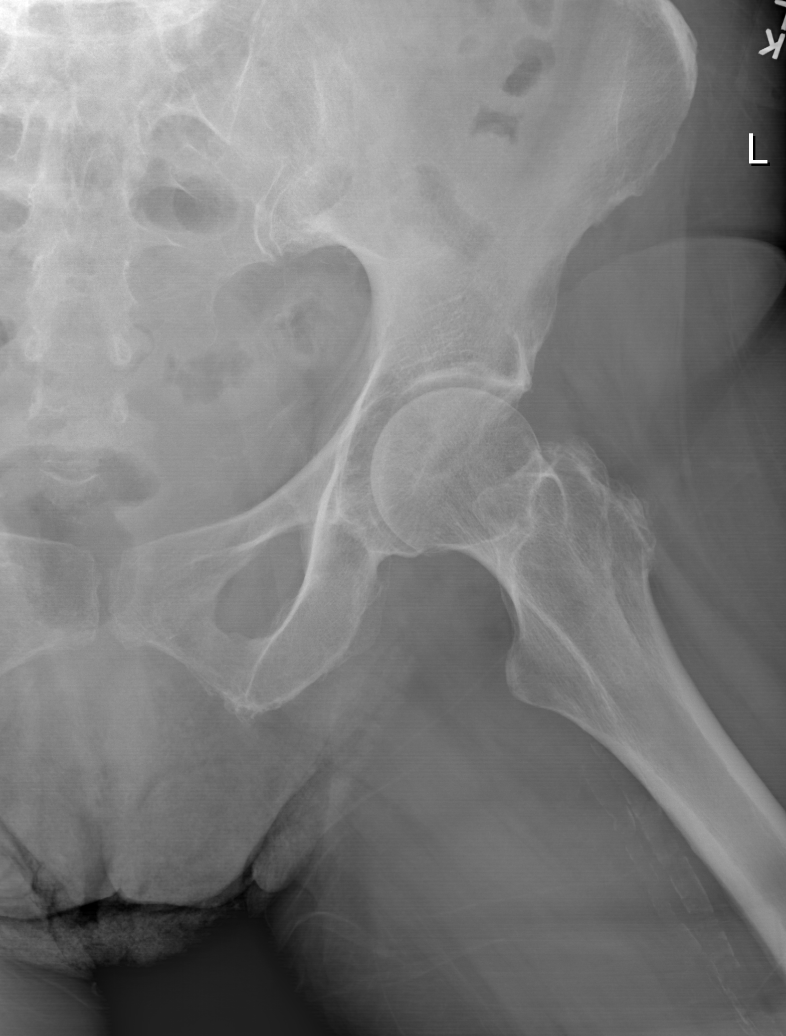

[x hip ap right]
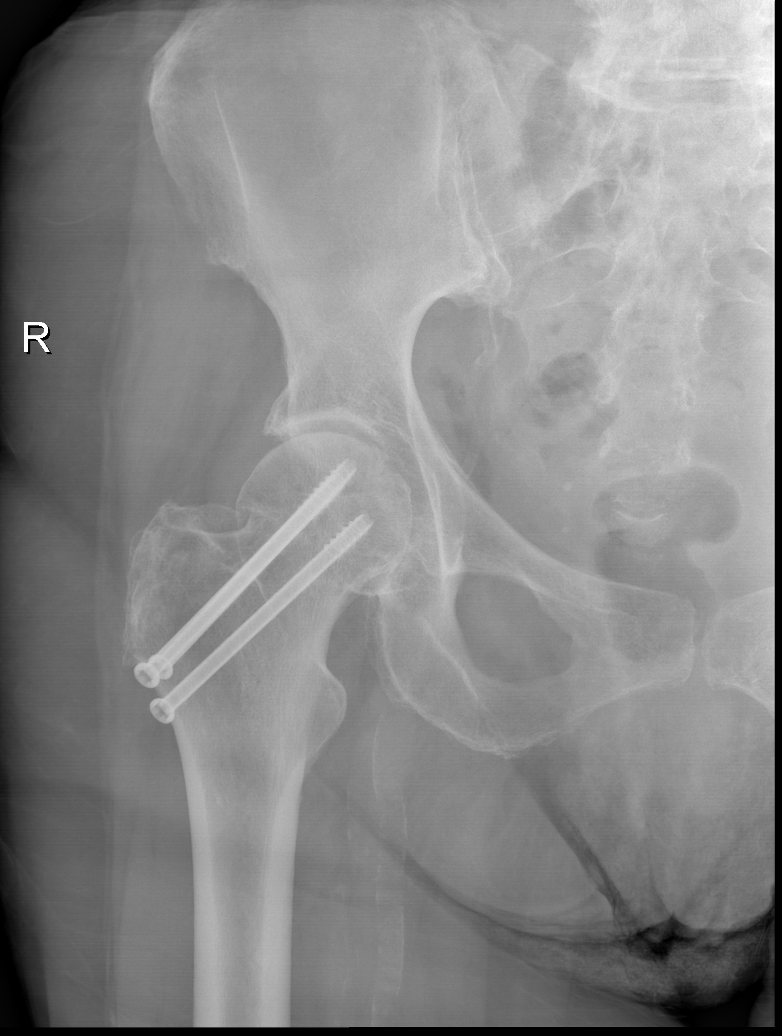

[x hip lat right]
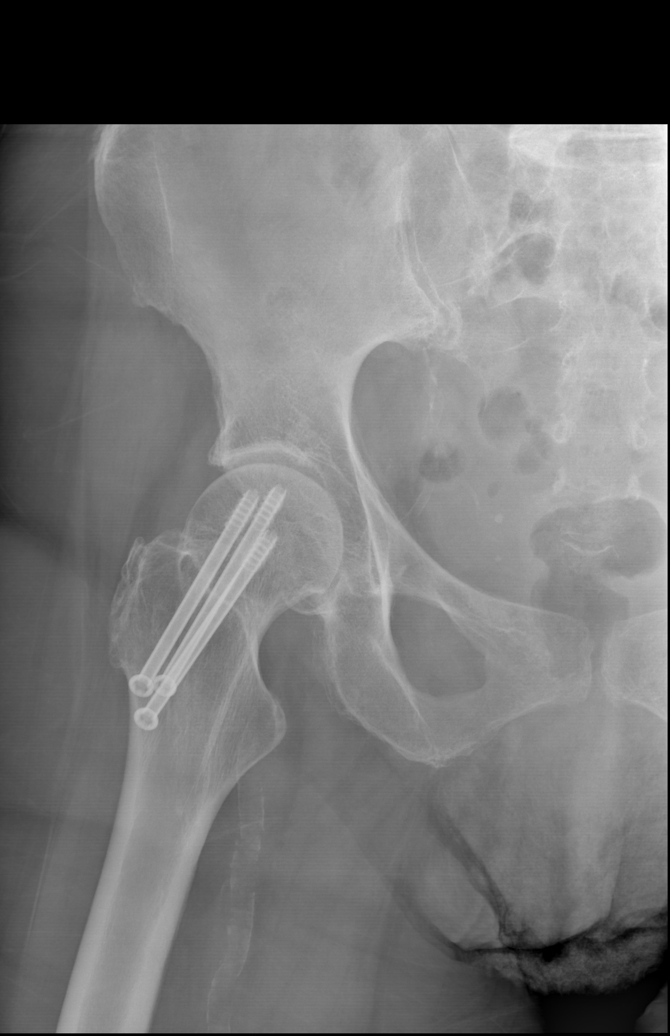

[5 of 5 positions shown; findings below may reference images not displayed]

FINDINGS: There is no evidence of acute hip fracture or dislocation. There is
no evidence of arthropathy. Status post surgical repair of old right
proximal femoral fracture.
IMPRESSION: No acute abnormality seen involving either hip.

## 2020-10-13 IMAGING — CT CT CERVICAL SPINE WITHOUT CONTRAST
4 of 8 series · 11 of 33 positions shown, 12 images · non-contrast
Comparison: 07/16/2018 and prior CTs

CLINICAL DATA: 85-year-old female with head and neck injury
following fall.

EXAM:
CT HEAD WITHOUT CONTRAST
CT CERVICAL SPINE WITHOUT CONTRAST
TECHNIQUE: Multidetector CT imaging of the head and cervical spine was
performed following the standard protocol without intravenous
contrast. Multiplanar CT image reconstructions of the cervical spine
were also generated.

[Series 9: c spine soft · axial · 0.30mm/px · z∈[-288,-192]mm · 3 of 98 slices shown]
[im 25/98  soft-tissue]
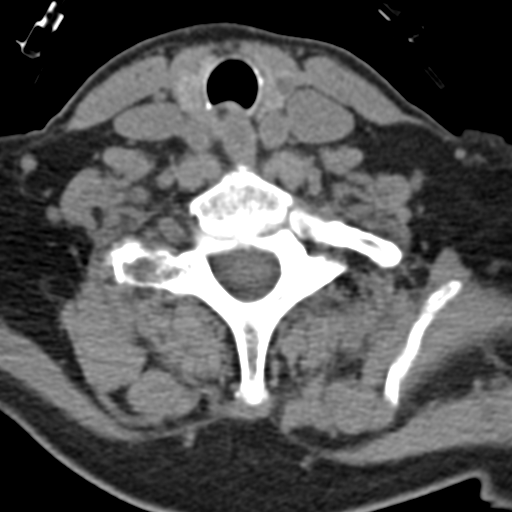
[im 49/98  soft-tissue]
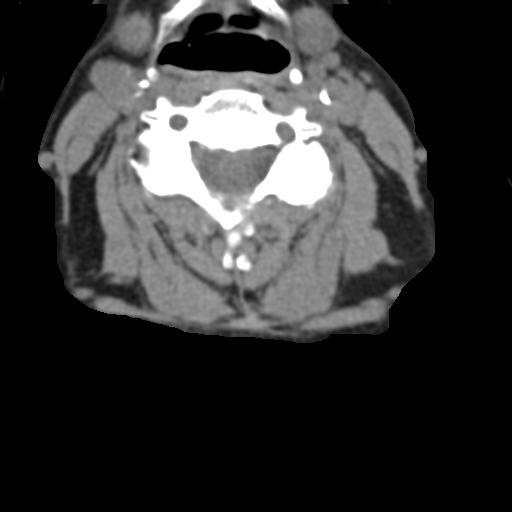
[im 73/98  soft-tissue]
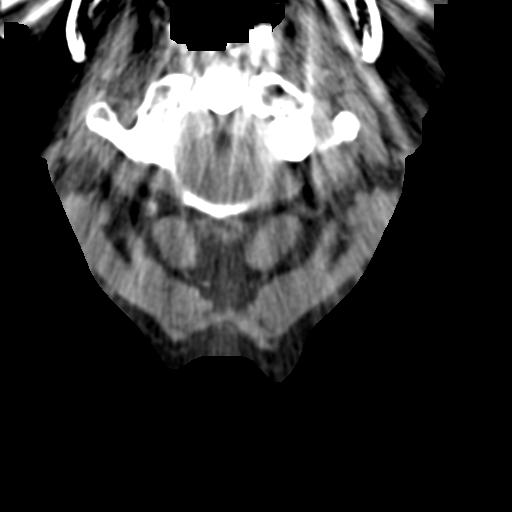

[Series 10: orthogonal bone · axial · 0.23mm/px · z∈[-308,-208]mm · 3 of 108 slices shown, 4 images]
[im 27/108  soft-tissue]
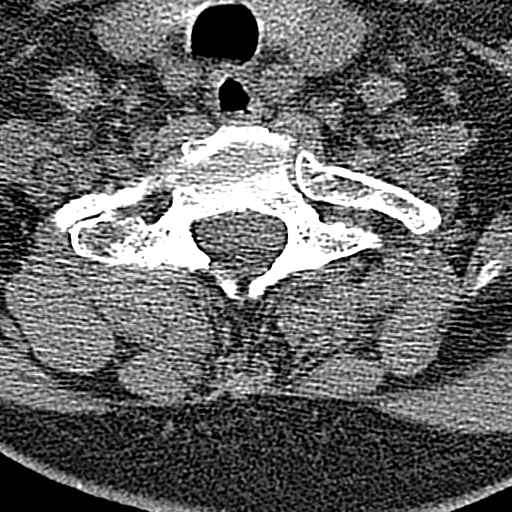
[im 27/108  bone]
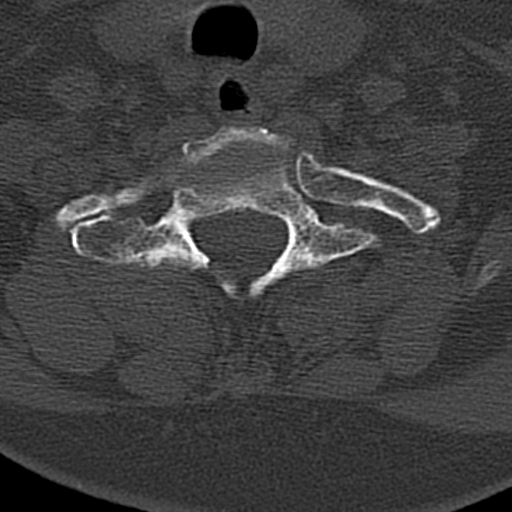
[im 54/108  bone]
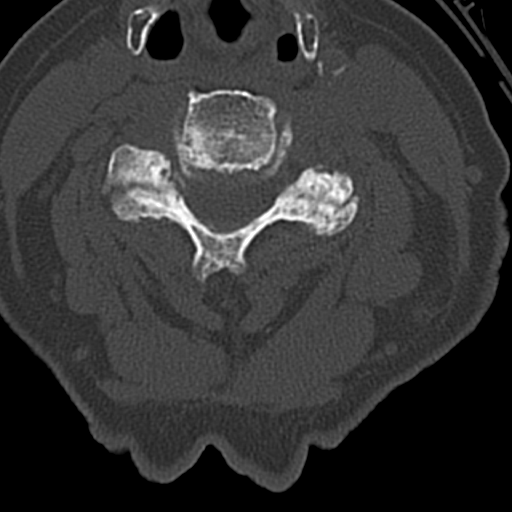
[im 81/108  bone]
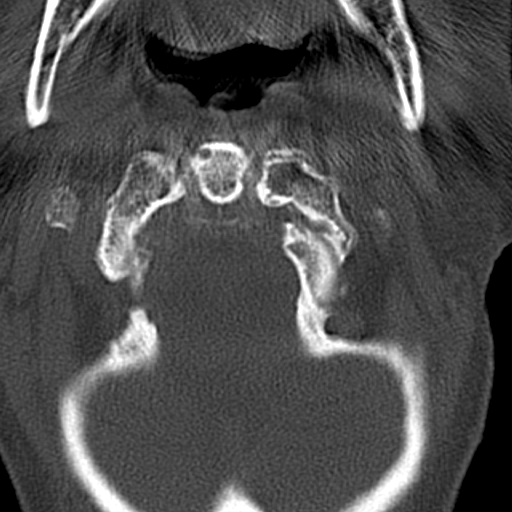

[Series 11: coronal bone · coronal · 0.22mm/px · 1 of 63 slices shown]
[im 32/63  bone]
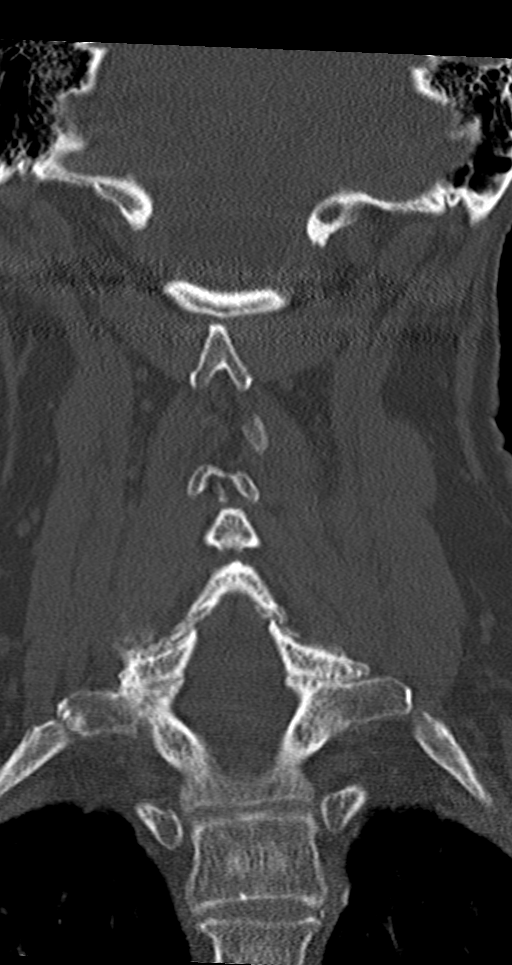

[Series 12: sagittal bone · sagittal · 0.24mm/px · 4 of 53 slices shown]
[im 11/53  bone]
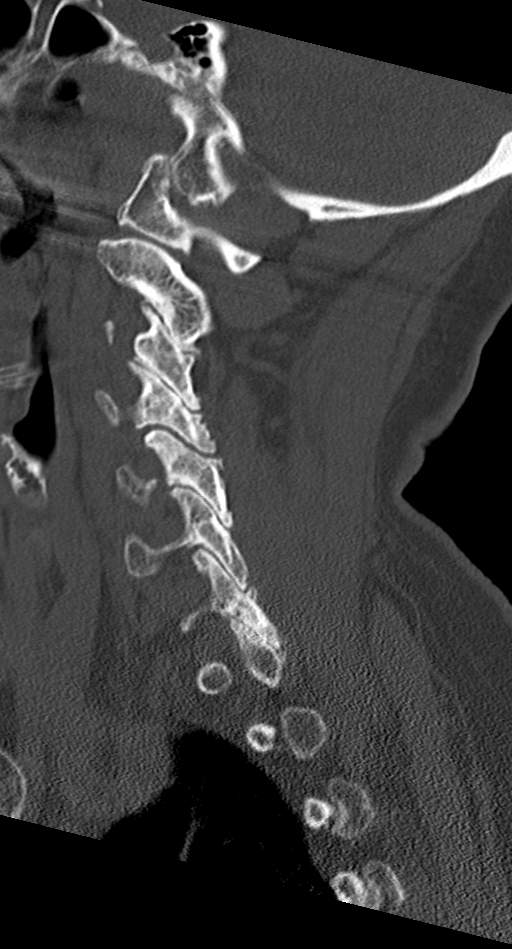
[im 21/53  bone]
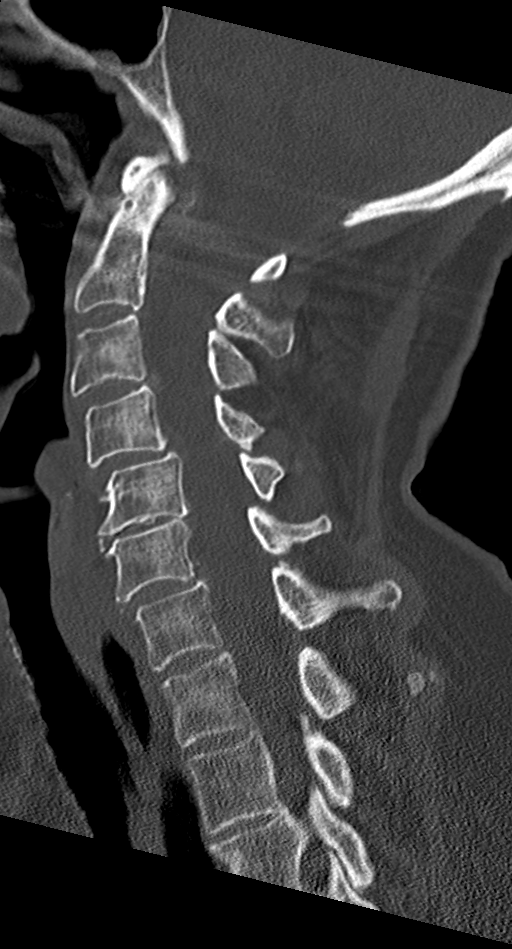
[im 32/53  bone]
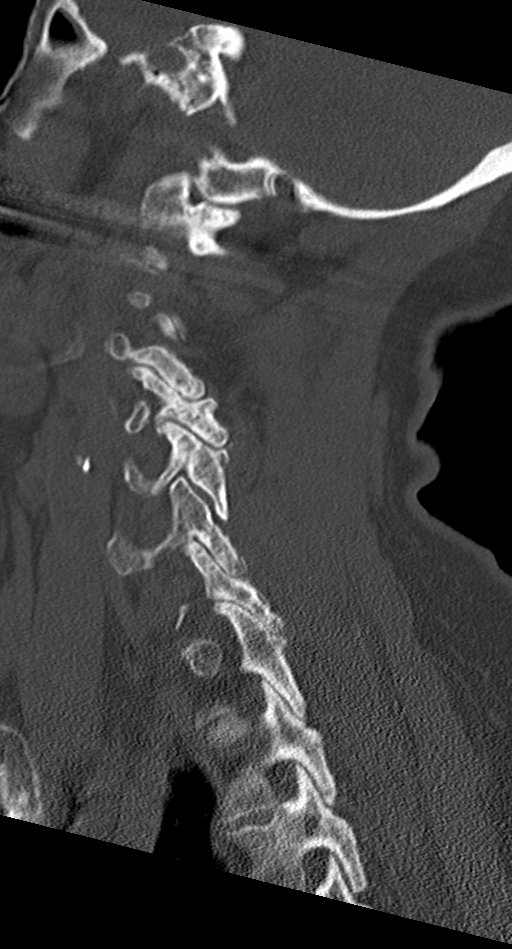
[im 42/53  bone]
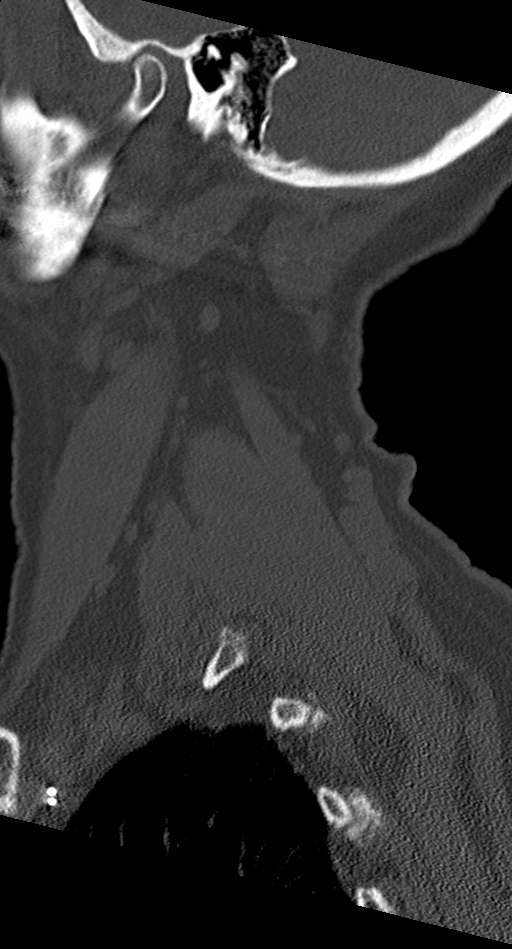

[11 of 33 positions shown; findings below may reference images not displayed]

FINDINGS: CT HEAD FINDINGS

Brain: No evidence of acute infarction, hemorrhage, hydrocephalus,
extra-axial collection or mass lesion/mass effect.

Atrophy and chronic small-vessel white matter ischemic changes again
noted.

Vascular: Carotid atherosclerotic calcifications again noted.

Skull: No acute abnormality or suspicious focal lesion. A remote
RIGHT zygoma fracture is noted.

Sinuses/Orbits: No acute finding.

Other: None.

CT CERVICAL SPINE FINDINGS

Alignment: Normal.

Skull base and vertebrae: No acute fracture. No primary bone lesion
or focal pathologic process.

Soft tissues and spinal canal: No prevertebral fluid or swelling. No
visible canal hematoma.

Disc levels: Mild moderate multilevel degenerative disc
disease/spondylosis noted, greatest at C5-6. Moderate multilevel
facet arthropathy identified.

Upper chest: No acute abnormality

Other: None
IMPRESSION: 1. No evidence of acute intracranial abnormality. Atrophy and
chronic small-vessel white matter ischemic changes.
2. No static evidence of acute injury to the cervical spine.
Multilevel degenerative changes.
